# Patient Record
Sex: Male | Born: 1970
Health system: Southern US, Community
[De-identification: ages and names within clinical notes are randomized; demographics above are authoritative.]

## PROBLEM LIST (undated history)

## (undated) DIAGNOSIS — N189 Chronic kidney disease, unspecified: Secondary | ICD-10-CM

## (undated) DIAGNOSIS — C801 Malignant (primary) neoplasm, unspecified: Secondary | ICD-10-CM

## (undated) DIAGNOSIS — F909 Attention-deficit hyperactivity disorder, unspecified type: Secondary | ICD-10-CM

## (undated) DIAGNOSIS — G4730 Sleep apnea, unspecified: Secondary | ICD-10-CM

## (undated) DIAGNOSIS — N529 Male erectile dysfunction, unspecified: Secondary | ICD-10-CM

## (undated) DIAGNOSIS — T50902A Poisoning by unspecified drugs, medicaments and biological substances, intentional self-harm, initial encounter: Secondary | ICD-10-CM

## (undated) HISTORY — PX: NO PAST SURGERIES: SHX2092

## (undated) HISTORY — PX: PROSTATE BIOPSY: SHX241

---

## 1997-07-09 ENCOUNTER — Emergency Department (HOSPITAL_COMMUNITY): Admission: EM | Admit: 1997-07-09 | Discharge: 1997-07-09 | Payer: Self-pay | Admitting: Emergency Medicine

## 2009-11-08 ENCOUNTER — Emergency Department (HOSPITAL_COMMUNITY): Admission: EM | Admit: 2009-11-08 | Discharge: 2009-11-09 | Payer: Self-pay | Admitting: Emergency Medicine

## 2009-11-08 IMAGING — CR DG FOREARM 2V*L*
2 series · 2 of 2 positions shown · non-contrast
Comparison: None.

Addendum Begins

Subtle, nondisplaced fracture is identified adjacent to the distal
most screw of the screw plate fixation of the ulna.
Addendum Ends
CLINICAL DATA: Left arm injury and pain.
LEFT FOREARM - 2 VIEW

[x forearm ap left]
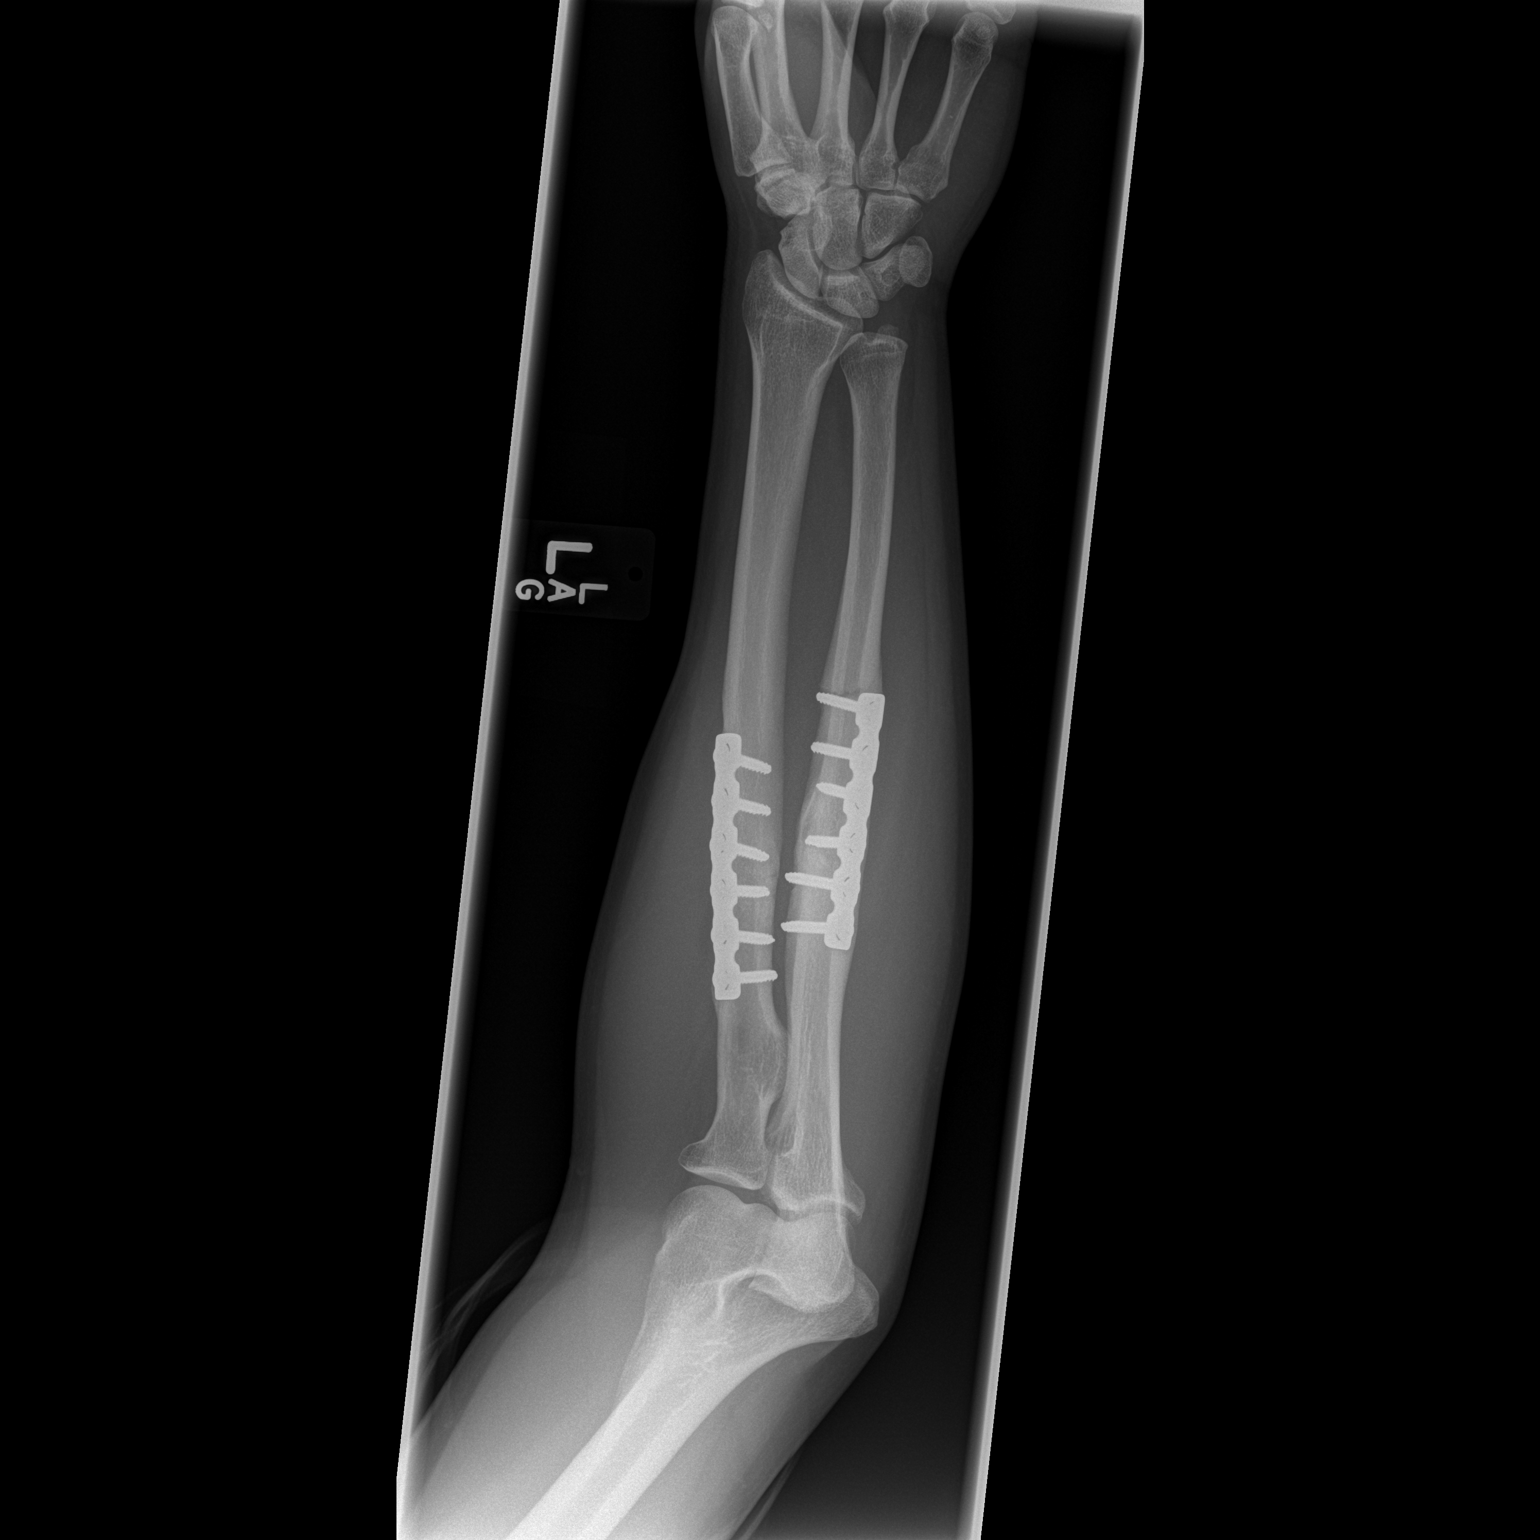

[x forearm lat left]
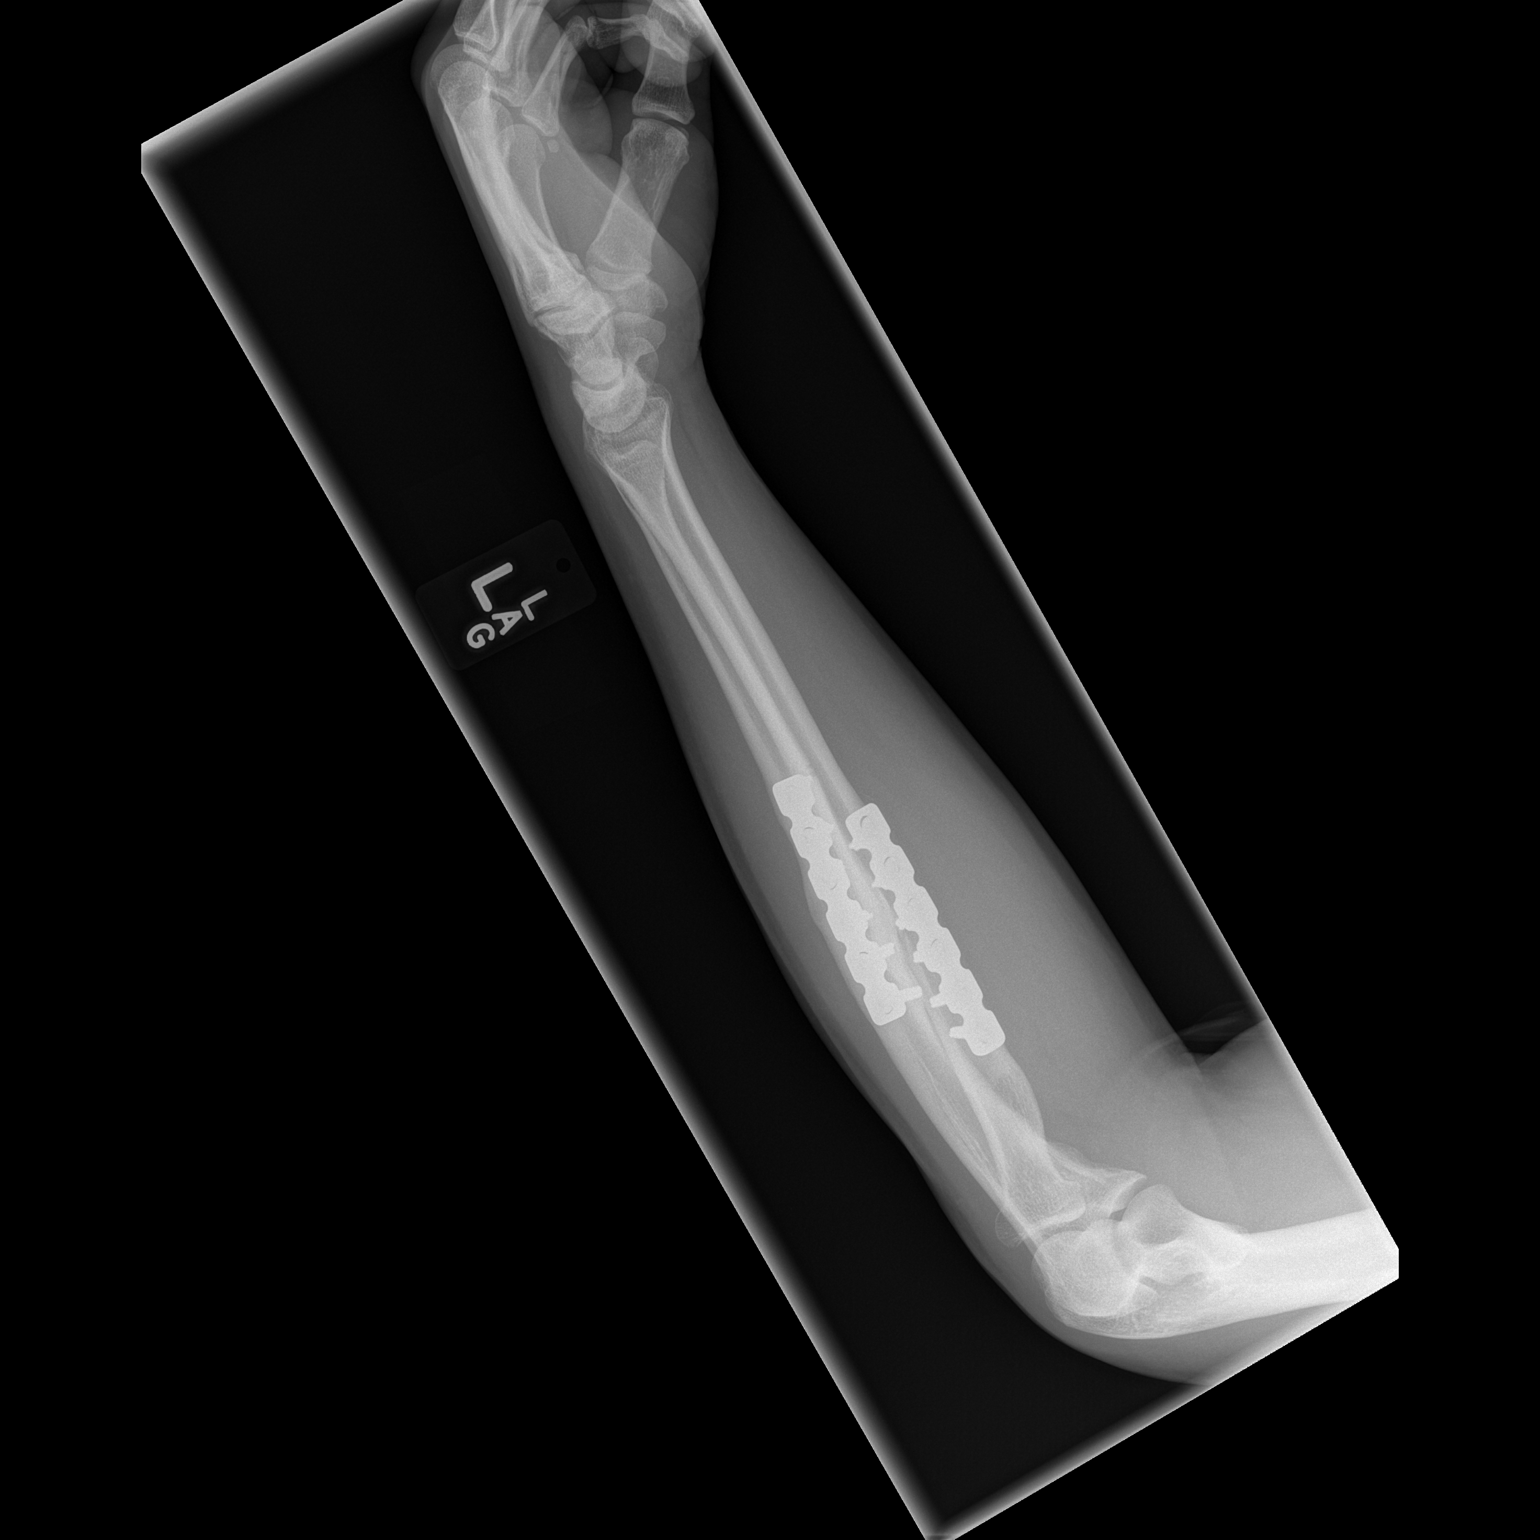

[2 of 2 positions shown; findings below may reference images not displayed]

FINDINGS: Plate and screws are seen fixing old healed fractures of
the diaphyses of the radius and ulna.  Position and alignment are
anatomic.  Hardware intact.  No acute abnormality is identified.
IMPRESSION: 1.  No acute abnormality.
2.  Old healed ulnar and radial fractures.

## 2011-07-11 ENCOUNTER — Encounter (HOSPITAL_COMMUNITY): Payer: Self-pay | Admitting: *Deleted

## 2011-07-11 ENCOUNTER — Emergency Department (HOSPITAL_COMMUNITY)
Admission: EM | Admit: 2011-07-11 | Discharge: 2011-07-12 | Disposition: A | Discharge status: Home / Self Care | Payer: Self-pay | Attending: Emergency Medicine | Admitting: Emergency Medicine

## 2011-07-11 DIAGNOSIS — T50901A Poisoning by unspecified drugs, medicaments and biological substances, accidental (unintentional), initial encounter: Secondary | ICD-10-CM | POA: Insufficient documentation

## 2011-07-11 DIAGNOSIS — T50902A Poisoning by unspecified drugs, medicaments and biological substances, intentional self-harm, initial encounter: Secondary | ICD-10-CM | POA: Insufficient documentation

## 2011-07-11 DIAGNOSIS — T1491XA Suicide attempt, initial encounter: Secondary | ICD-10-CM

## 2011-07-11 DIAGNOSIS — F3289 Other specified depressive episodes: Secondary | ICD-10-CM | POA: Insufficient documentation

## 2011-07-11 DIAGNOSIS — F17200 Nicotine dependence, unspecified, uncomplicated: Secondary | ICD-10-CM | POA: Insufficient documentation

## 2011-07-11 DIAGNOSIS — F32A Depression, unspecified: Secondary | ICD-10-CM

## 2011-07-11 DIAGNOSIS — F329 Major depressive disorder, single episode, unspecified: Secondary | ICD-10-CM | POA: Insufficient documentation

## 2011-07-11 LAB — COMPREHENSIVE METABOLIC PANEL
ALT: 12 U/L (ref 0–53)
AST: 14 U/L (ref 0–37)
Albumin: 3.5 g/dL (ref 3.5–5.2)
Alkaline Phosphatase: 62 U/L (ref 39–117)
BUN: 8 mg/dL (ref 6–23)
CO2: 25 mEq/L (ref 19–32)
Calcium: 9 mg/dL (ref 8.4–10.5)
Chloride: 107 mEq/L (ref 96–112)
Creatinine, Ser: 1 mg/dL (ref 0.50–1.35)
GFR calc Af Amer: >90 mL/min (ref >90)
GFR calc non Af Amer: >90 mL/min (ref >90)
Glucose, Bld: 94 mg/dL (ref 70–99)
Potassium: 3.2 mEq/L — ABNORMAL LOW (ref 3.5–5.1)
Sodium: 142 mEq/L (ref 135–145)
Total Bilirubin: 0.3 mg/dL (ref 0.3–1.2)
Total Protein: 6.5 g/dL (ref 6.0–8.3)

## 2011-07-11 LAB — DIFFERENTIAL
Basophils Absolute: 0 10*3/uL (ref 0.0–0.1)
Basophils Relative: 1 % (ref 0–1)
Eosinophils Absolute: 0 10*3/uL (ref 0.0–0.7)
Eosinophils Relative: 0 % (ref 0–5)
Lymphocytes Relative: 24 % (ref 12–46)
Lymphs Abs: 1.4 10*3/uL (ref 0.7–4.0)
Monocytes Absolute: 0.6 10*3/uL (ref 0.1–1.0)
Monocytes Relative: 9 % (ref 3–12)
Neutro Abs: 3.8 10*3/uL (ref 1.7–7.7)
Neutrophils Relative %: 65 % (ref 43–77)

## 2011-07-11 LAB — URINALYSIS, ROUTINE W REFLEX MICROSCOPIC
Bilirubin Urine: NEGATIVE
Glucose, UA: NEGATIVE mg/dL
Hgb urine dipstick: NEGATIVE
Ketones, ur: NEGATIVE mg/dL
Leukocytes, UA: NEGATIVE
Nitrite: NEGATIVE
Protein, ur: NEGATIVE mg/dL
Specific Gravity, Urine: 1.02 (ref 1.005–1.030)
Urobilinogen, UA: 0.2 mg/dL (ref 0.0–1.0)
pH: 6 (ref 5.0–8.0)

## 2011-07-11 LAB — SALICYLATE LEVEL: Salicylate Lvl: <2 mg/dL — ABNORMAL LOW (ref 2.8–20.0)

## 2011-07-11 LAB — LIPASE, BLOOD: Lipase: 31 U/L (ref 11–59)

## 2011-07-11 LAB — CBC
HCT: 42.5 % (ref 39.0–52.0)
Hemoglobin: 14.6 g/dL (ref 13.0–17.0)
MCH: 31.1 pg (ref 26.0–34.0)
MCHC: 34.4 g/dL (ref 30.0–36.0)
MCV: 90.6 fL (ref 78.0–100.0)
Platelets: 272 10*3/uL (ref 150–400)
RBC: 4.69 MIL/uL (ref 4.22–5.81)
RDW: 12.9 % (ref 11.5–15.5)
WBC: 5.9 10*3/uL (ref 4.0–10.5)

## 2011-07-11 LAB — ACETAMINOPHEN LEVEL: Acetaminophen (Tylenol), Serum: <15 ug/mL (ref 10–30)

## 2011-07-11 MED ORDER — IBUPROFEN 600 MG PO TABS: 600.0000 mg | ORAL_TABLET | Freq: Three times a day (TID) | ORAL | Status: DC | PRN

## 2011-07-11 MED ORDER — SODIUM CHLORIDE 0.9 % IV SOLN
Freq: Once | INTRAVENOUS | Status: AC
  Administered 2011-07-11: 11:00:00 via INTRAVENOUS

## 2011-07-11 MED ORDER — ONDANSETRON HCL 4 MG/2ML IJ SOLN
4.0000 mg | Freq: Once | INTRAMUSCULAR | Status: AC
  Administered 2011-07-11: 4 mg via INTRAVENOUS
  Filled 2011-07-11: qty 2

## 2011-07-11 MED ORDER — NICOTINE 21 MG/24HR TD PT24
21.0000 mg | MEDICATED_PATCH | Freq: Every day | TRANSDERMAL | Status: DC | PRN
  Administered 2011-07-11 – 2011-07-12 (×2): 21 mg via TRANSDERMAL
  Filled 2011-07-11 (×2): qty 1

## 2011-07-11 MED ORDER — ACTIDOSE WITH SORBITOL 50 GM/240ML PO LIQD
75.0000 g | Freq: Once | ORAL | Status: AC
  Administered 2011-07-11: 75 g via ORAL
  Filled 2011-07-11: qty 480

## 2011-07-11 MED ORDER — ACETAMINOPHEN 325 MG PO TABS: 650.0000 mg | ORAL_TABLET | ORAL | Status: DC | PRN

## 2011-07-11 MED ORDER — ONDANSETRON HCL 4 MG PO TABS: 4.0000 mg | ORAL_TABLET | Freq: Three times a day (TID) | ORAL | Status: DC | PRN

## 2011-07-11 MED ORDER — ZOLPIDEM TARTRATE 5 MG PO TABS: 5.0000 mg | ORAL_TABLET | Freq: Every evening | ORAL | Status: DC | PRN

## 2011-07-11 NOTE — ED Notes (Signed)
Per ems: pt took unknown number of metaformin and linsinapril, levothyroidin. Pt states he took them because he wanted to sleep. Pt is going through a seperation. Pt denies si but wanted to sleep. His ex wife found out he took all the pills and called the gpd. gpd called ems. Pt is stable

## 2011-07-11 NOTE — ED Notes (Signed)
Sitter at bedside. Poison control called edmd notified or recommendations

## 2011-07-11 NOTE — ED Provider Notes (Signed)
History     CSN: [id number]  Arrival date & time 07/11/11  0865   First MD Initiated Contact with Patient 07/11/11 1017      Chief Complaint  Patient presents with  . Drug Overdose    (Consider location/radiation/quality/duration/timing/severity/associated sxs/prior treatment) HPI Comments: Roberto Knapp is a 41 y.o. Male who overdosed on unknown medication at 9 AM this morning. He took multiple pills of multiple types that were his grandmother's. Prior to the ingestion, which occurred at his grandmother's house, at 6 AM, the patient called his ex-girlfriend to tell her to pick up things from their home. They separated 10 days ago. He's been upset over the separation. He is helpless and hopeless. He does not have a therapist. 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 He had no preceding illness.  Patient is a 41 y.o. male presenting with Overdose. The history is provided by the patient.  Drug Overdose    History reviewed. No pertinent past medical history.  History reviewed. No pertinent past surgical history.  No family history on file.  History  Substance Use Topics  . Smoking status: Current Everyday Smoker  . Smokeless tobacco: Not on file  . Alcohol Use: No      Review of Systems  All other systems reviewed and are negative.    Allergies  Review of patient's allergies indicates no known allergies.  Home Medications  No current outpatient prescriptions on file.  BP 102/64  Pulse 55  Temp(Src) 98.3 F (36.8 C) (Oral)  Resp 14  Wt 137 lb 1.6 oz (62.188 kg)  SpO2 100%  Physical Exam  Nursing note and vitals reviewed. Constitutional: He is oriented to person, place, and time. He appears well-developed and well-nourished.  HENT:  Head: Normocephalic and atraumatic.  Right Ear: External ear normal.  Left Ear: External ear normal.  Eyes: Conjunctivae and EOM are normal. Pupils are equal, round, and reactive to light.  Neck: Normal range of motion and phonation normal.  Neck supple.  Cardiovascular: Normal rate, regular rhythm, normal heart sounds and intact distal pulses.   Pulmonary/Chest: Effort normal and breath sounds normal. He exhibits no bony tenderness.  Abdominal: Soft. Normal appearance. There is no tenderness.  Musculoskeletal: Normal range of motion.  Neurological: He is alert and oriented to person, place, and time. He has normal strength. No cranial nerve deficit or sensory deficit. He exhibits normal muscle tone. Coordination normal.  Skin: Skin is warm, dry and intact.  Psychiatric: His behavior is normal.       He is tearful and depressed appearing    ED Course  Procedures (including critical care time)    Date: 07/11/2011  Rate: 62  Rhythm: normal sinus rhythm  QRS Axis: normal  Intervals: normal  ST/T Wave abnormalities: normal  Conduction Disutrbances:none  Narrative Interpretation:   Old EKG Reviewed: none available ED Treatment: Oral Charcoal    Labs Reviewed  COMPREHENSIVE METABOLIC PANEL - Abnormal; Notable for the following:    Potassium 3.2 (*)    All other components within normal limits  SALICYLATE LEVEL - Abnormal; Notable for the following:    Salicylate Lvl <2.0 (*)    All other components within normal limits  CBC  DIFFERENTIAL  LIPASE, BLOOD  URINALYSIS, ROUTINE W REFLEX MICROSCOPIC  ACETAMINOPHEN LEVEL   No results found.   1. Suicide attempt   2. Depression       MDM  Intentional Drug OD, meds. Taken are not known. Medically cleared in ED. He needs  psychiatric stabilization. Pt is voluntary.        Flint Melter, MD 07/11/11 2132

## 2011-07-11 NOTE — BH Assessment (Addendum)
Assessment Note   Roberto Knapp is a 41 y.o. male who was brought in by EMS after pt called his ex-girlfriend stating that he had taken an unknown amount of his grandfather's medication. Pt currently denies SI. Pt states he broke up with his girlfriend last Friday and has been having a difficult time coping. He states they were together a long time and he has been having having symptoms of depression since their break-up. He states he has had no appetite and has lost 8 pounds in the past week. He also states that he has been unable to sleep more than 2 hours a night since the break up. Pt reports he wanted to sleep so he took his grandfather's medication. He denies any suicidal intent, but repeated several times he wants to go to sleep and not have to wake up.  He reports no prior mental health concerns. Pt also expresses that Sunday is the 13th anniversary of his mother's death and mother's day. He states he has been thinking about her a lot over the past few days. He reports almost no social supports, stating he lives with his Grandfather who is "sometimes" supportive "depending on his mood."  Pt denies HI, AHVH, and SA. Pt reports he does not want to miss work this week and lose his job. He is currently seeking outpatient referrals and states he can contract for safety.     Axis I: Adjustment Disorder with Depressed Mood Axis II: Deferred Axis III: History reviewed. No pertinent past medical history. Axis IV: other psychosocial or environmental problems, problems related to legal system/crime, problems related to social environment and problems with primary support group Axis V: 31-40 impairment in reality testing  Past Medical History: History reviewed. No pertinent past medical history.  History reviewed. No pertinent past surgical history.  Family History: No family history on file.  Social History:  reports that he has been smoking.  He does not have any smokeless tobacco history on file. He  reports that he does not drink alcohol or use illicit drugs.  Additional Social History:  Alcohol / Drug Use History of alcohol / drug use?: No history of alcohol / drug abuse Allergies: No Known Allergies  Home Medications:  (Not in a hospital admission)  OB/GYN Status:  No LMP for male patient.  General Assessment Data Location of Assessment: [facility] ED Living Arrangements: Other relatives (grandfather) Can pt return to current living arrangement?: Yes Admission Status: Voluntary Is patient capable of signing voluntary admission?: Yes Transfer from: Acute Hospital Referral Source: MD  Education Status Is patient currently in school?: No Highest grade of school patient has completed: 10  Risk to self Suicidal Intent: No (pt denies but per EMS, pt called ex-girlfriend expressing SI) Is patient at risk for suicide?: Yes Suicidal Plan?: No (pt denies, per EMS pt attempted by OD) Access to Means: Yes Specify Access to Suicidal Means: grandfather's medication What has been your use of drugs/alcohol within the last 12 months?: denies Previous Attempts/Gestures: No How many times?: 0  Other Self Harm Risks: none Triggers for Past Attempts: None known Intentional Self Injurious Behavior: None Family Suicide History: No Recent stressful life event(s):  (break up with girlfriend, anversiary of mother's death) Persecutory voices/beliefs?: No Depression: Yes Depression Symptoms: Despondent;Tearfulness;Fatigue;Isolating;Loss of interest in usual pleasures Substance abuse history and/or treatment for substance abuse?: No Suicide prevention information given to non-admitted patients: Not applicable  Risk to Others Homicidal Ideation: No Thoughts of Harm to Others: No Current  Homicidal Intent: No Current Homicidal Plan: No Access to Homicidal Means: No Identified Victim: none History of harm to others?: No Assessment of Violence: None Noted Violent Behavior Description:  cooperative Does patient have access to weapons?: No Criminal Charges Pending?: Yes Describe Pending Criminal Charges: larceny Does patient have a court date: Yes Court Date: 07/20/11  Psychosis Hallucinations: None noted Delusions: None noted  Mental Status Report Appear/Hygiene: Disheveled Eye Contact: Fair Motor Activity: Unremarkable Speech: Logical/coherent Level of Consciousness: Alert Mood: Depressed Affect: Depressed Anxiety Level: None Thought Processes: Coherent;Relevant Judgement: Impaired Orientation: Person;Place;Time;Situation Obsessive Compulsive Thoughts/Behaviors: None  Cognitive Functioning Concentration: Normal Memory: Recent Intact;Remote Intact IQ: Average Insight: Fair Impulse Control: Poor Appetite: Poor Weight Loss: 8  (in 1 week) Weight Gain: 0  Sleep: Decreased Total Hours of Sleep: 2  Vegetative Symptoms: None  Prior Inpatient Therapy Prior Inpatient Therapy: No Prior Therapy Dates: n/a Prior Therapy Facilty/Provider(s): n/a Reason for Treatment: n/a  Prior Outpatient Therapy Prior Outpatient Therapy: No Prior Therapy Dates: n/a Prior Therapy Facilty/Provider(s): n/a Reason for Treatment: n/a  ADL Screening (condition at time of admission) Patient's cognitive ability adequate to safely complete daily activities?: Yes Patient able to express need for assistance with ADLs?: Yes Independently performs ADLs?: Yes Weakness of Legs: None Weakness of Arms/Hands: None  Home Assistive Devices/Equipment Home Assistive Devices/Equipment: None    Abuse/Neglect Assessment (Assessment to be complete while patient is alone) Physical Abuse: Denies Verbal Abuse: Denies Sexual Abuse: Denies Exploitation of patient/patient's resources: Denies Self-Neglect: Denies Values / Beliefs Cultural Requests During Hospitalization: None Spiritual Requests During Hospitalization: None   Advance Directives (For Healthcare) Advance Directive: Patient  does not have advance directive;Patient would not like information Pre-existing out of facility DNR order (yellow form or pink MOST form): No Nutrition Screen Diet: Regular Unintentional weight loss greater than 10lbs within the last month: No Problems chewing or swallowing foods and/or liquids: No Home Tube Feeding or Total Parenteral Nutrition (TPN): No Patient appears severely malnourished: No  Additional Information 1:1 In Past 12 Months?: No CIRT Risk: No Elopement Risk: No Does patient have medical clearance?: Yes     Disposition:  Disposition Disposition of Patient: Other dispositions (Pending telepsych)  On Site Evaluation by:   Reviewed with Physician:     Marjean Donna 07/11/2011 8:59 PM

## 2011-07-11 NOTE — Progress Notes (Signed)
Pt confirms he has never been to a pcp, psychiatrist nor psychologist

## 2011-07-11 NOTE — ED Notes (Signed)
Pt states he does not have to void at this time.  

## 2011-07-11 NOTE — ED Notes (Signed)
HQI:ON62<XB> Expected date:07/11/11<BR> Expected time: 9:44 AM<BR> Means of arrival:Ambulance<BR> Comments:<BR> overdose

## 2011-07-11 NOTE — ED Notes (Signed)
Pt states " I just want to go away." Pt states he is unsure of what he has taken he just put the in his hand and took them. Pt is alert and oriented at this time.no other c/o

## 2011-07-11 NOTE — ED Notes (Signed)
Poison controlled called... Recommendation will be given to edmd

## 2011-07-11 NOTE — ED Notes (Signed)
Sitter at bedside.

## 2011-07-12 NOTE — ED Notes (Signed)
Called Specialist on Call and requested a copy of the telepsych consult performed 07/11/2011 at 2329. Report was not retrieved last night when ACT team consultant tried and was not retrieved earlier today.

## 2011-07-12 NOTE — ED Provider Notes (Signed)
Pt was assessed by Tele psych. Pt wants to go home and the psychiatrist felt discharge was reasonable with outpatient follow up  Celene Kras, MD 07/12/11 [phone]

## 2011-07-12 NOTE — ED Notes (Addendum)
Discharge papers reviewed with pt who verbalizes understanding. Referrals given for OP treatment and mobile crisis. Bus pass given and pt departs in safe and stable condition.

## 2011-07-12 NOTE — ED Notes (Signed)
Telepsych obtained. Copy given to EDP for review. ACT recommends discharge.

## 2011-07-12 NOTE — Discharge Instructions (Signed)
Depression  You have signs of depression. This is a common problem. It can occur at any age. It is often hard to recognize. People can suffer from depression and still have moments of enjoyment. Depression interferes with your basic ability to function in life. It upsets your relationships, sleep, eating, and work habits.  CAUSES   Depression is believed to be caused by an imbalance in brain chemicals. It may be triggered by an unpleasant event. Relationship crises, a death in the family, financial worries, retirement, or other stressors are normal causes of depression. Depression may also start for no known reason. Other factors that may play a part include medical illnesses, some medicines, genetics, and alcohol or drug abuse.  SYMPTOMS    Feeling unhappy or worthless.   Long-lasting (chronic) tiredness or worn-out feeling.   Self-destructive thoughts and actions.   Not being able to sleep or sleeping too much.   Eating more than usual or not eating at all.   Headaches or feeling anxious.   Trouble concentrating or making decisions.   Unexplained physical problems and substance abuse.  TREATMENT   Depression usually gets better with treatment. This can include:   Antidepressant medicines. It can take weeks before the proper dose is achieved and benefits are reached.   Talking with a therapist, clergyperson, counselor, or friend. These people can help you gain insight into your problem and regain control of your life.   Eating a good diet.   Getting regular physical exercise, such as walking for 30 minutes every day.   Not abusing alcohol or drugs.  Treating depression often takes 6 months or longer. This length of treatment is needed to keep symptoms from returning. Call your caregiver and arrange for follow-up care as suggested.  SEEK IMMEDIATE MEDICAL CARE IF:    You start to have thoughts of hurting yourself or others.   Call your local emergency services (911 in [place]   Go to your local  medical emergency department.   Call the National Suicide Prevention Lifeline: 1-800-273-TALK (1-800-273-8255).  Document Released: 02/19/2005 Document Revised: 02/08/2011 Document Reviewed: 07/22/2009  Exit[employer] Patient Information 2012 [employer].

## 2011-07-12 NOTE — ED Provider Notes (Signed)
Pt seen and assessed this morning. Pt would like to go home. He is voluntary. Apparently pt had telepsych eval that recommended DC. I think this is reasonable based on my discussions with him. Written consult not on chart and unable to locate though. Working on obtaining another copy, specifically, to see if has further med recommendations.  Raeford Razor, MD 07/12/11 [phone]

## 2011-07-13 MED ORDER — CEFAZOLIN SODIUM 1-5 GM-% IV SOLN
INTRAVENOUS | Status: AC
  Filled 2011-07-13: qty 50

## 2011-08-19 ENCOUNTER — Emergency Department (HOSPITAL_COMMUNITY)
Admission: EM | Admit: 2011-08-19 | Discharge: 2011-08-19 | Disposition: A | Discharge status: Home / Self Care | Payer: Self-pay | Attending: Emergency Medicine | Admitting: Emergency Medicine

## 2011-08-19 ENCOUNTER — Encounter (HOSPITAL_COMMUNITY): Payer: Self-pay | Admitting: *Deleted

## 2011-08-19 DIAGNOSIS — F19939 Other psychoactive substance use, unspecified with withdrawal, unspecified: Secondary | ICD-10-CM | POA: Insufficient documentation

## 2011-08-19 DIAGNOSIS — F1420 Cocaine dependence, uncomplicated: Secondary | ICD-10-CM | POA: Insufficient documentation

## 2011-08-19 DIAGNOSIS — F19239 Other psychoactive substance dependence with withdrawal, unspecified: Secondary | ICD-10-CM | POA: Insufficient documentation

## 2011-08-19 DIAGNOSIS — Z79899 Other long term (current) drug therapy: Secondary | ICD-10-CM | POA: Insufficient documentation

## 2011-08-19 DIAGNOSIS — R4182 Altered mental status, unspecified: Secondary | ICD-10-CM | POA: Insufficient documentation

## 2011-08-19 DIAGNOSIS — F1910 Other psychoactive substance abuse, uncomplicated: Secondary | ICD-10-CM

## 2011-08-19 DIAGNOSIS — F1423 Cocaine dependence with withdrawal: Secondary | ICD-10-CM

## 2011-08-19 DIAGNOSIS — F1493 Cocaine use, unspecified with withdrawal: Secondary | ICD-10-CM

## 2011-08-19 DIAGNOSIS — F17200 Nicotine dependence, unspecified, uncomplicated: Secondary | ICD-10-CM | POA: Insufficient documentation

## 2011-08-19 HISTORY — DX: Poisoning by unspecified drugs, medicaments and biological substances, intentional self-harm, initial encounter: T50.902A

## 2011-08-19 LAB — ETHANOL: Alcohol, Ethyl (B): 58 mg/dL — ABNORMAL HIGH (ref 0–11)

## 2011-08-19 LAB — COMPREHENSIVE METABOLIC PANEL
ALT: 18 U/L (ref 0–53)
AST: 19 U/L (ref 0–37)
Albumin: 4.1 g/dL (ref 3.5–5.2)
Alkaline Phosphatase: 64 U/L (ref 39–117)
BUN: 10 mg/dL (ref 6–23)
CO2: 24 mEq/L (ref 19–32)
Calcium: 8.7 mg/dL (ref 8.4–10.5)
Chloride: 107 mEq/L (ref 96–112)
Creatinine, Ser: 0.92 mg/dL (ref 0.50–1.35)
GFR calc Af Amer: >90 mL/min (ref >90)
GFR calc non Af Amer: >90 mL/min (ref >90)
Glucose, Bld: 82 mg/dL (ref 70–99)
Potassium: 3.3 mEq/L — ABNORMAL LOW (ref 3.5–5.1)
Sodium: 144 mEq/L (ref 135–145)
Total Bilirubin: 0.2 mg/dL — ABNORMAL LOW (ref 0.3–1.2)
Total Protein: 7.3 g/dL (ref 6.0–8.3)

## 2011-08-19 LAB — RAPID URINE DRUG SCREEN, HOSP PERFORMED
Amphetamines: NOT DETECTED
Barbiturates: NOT DETECTED
Benzodiazepines: NOT DETECTED
Cocaine: POSITIVE — AB
Opiates: POSITIVE — AB
Tetrahydrocannabinol: NOT DETECTED

## 2011-08-19 LAB — CBC
HCT: 43.5 % (ref 39.0–52.0)
Hemoglobin: 15.3 g/dL (ref 13.0–17.0)
MCH: 32.3 pg (ref 26.0–34.0)
MCHC: 35.2 g/dL (ref 30.0–36.0)
MCV: 92 fL (ref 78.0–100.0)
Platelets: 277 10*3/uL (ref 150–400)
RBC: 4.73 MIL/uL (ref 4.22–5.81)
RDW: 13.2 % (ref 11.5–15.5)
WBC: 10.4 10*3/uL (ref 4.0–10.5)

## 2011-08-19 LAB — PROTIME-INR
INR: 0.99 (ref 0.00–1.49)
Prothrombin Time: 13.3 seconds (ref 11.6–15.2)

## 2011-08-19 LAB — SALICYLATE LEVEL: Salicylate Lvl: <2 mg/dL — ABNORMAL LOW (ref 2.8–20.0)

## 2011-08-19 LAB — ACETAMINOPHEN LEVEL: Acetaminophen (Tylenol), Serum: <15 ug/mL (ref 10–30)

## 2011-08-19 NOTE — Discharge Instructions (Signed)
Finding Treatment for Alcohol and Drug Addiction   It can be hard to find the right place to get professional treatment. Here are some important things to consider:   There are different types of treatment to choose from.   Some programs are live-in (residential) while others are not (outpatient). Sometimes a combination is offered.   No single type of program is right for everyone.   Most treatment programs involve a combination of education, counseling, and a 12-step, spiritually-based approach.   There are non-spiritually based programs (not 12-step).   Some treatment programs are government sponsored. They are geared for patients without private insurance.   Treatment programs can vary in many respects such as:   Cost and types of insurance accepted.   Types of on-site medical services offered.   Length of stay, setting, and size.   Overall philosophy of treatment.   A person may need specialized treatment or have needs not addressed by all programs. For example, adolescents need treatment appropriate for their age. Other people have secondary disorders that must be managed as well. Secondary conditions can include mental illness, such as depression or diabetes. Often, a period of detoxification from alcohol or drugs is needed. This requires medical supervision and not all programs offer this.   THINGS TO CONSIDER WHEN SELECTING A TREATMENT PROGRAM   Is the program certified by the appropriate government agency? Even private programs must be certified and employ certified professionals.   Does the program accept your insurance? If not, can a payment plan be set up?   Is the facility clean, organized, and well run? Do they allow you to speak with graduates who can share their treatment experience with you? Can you tour the facility? Can you meet with staff?   Does the program meet the full range of individual needs?   Does the treatment program address sexual orientation and physical disabilities? Do they provide  age, gender, and culturally appropriate treatment services?   Is treatment available in languages other than English?   Is long-term aftercare support or guidance encouraged and provided?   Is assessment of an individual's treatment plan ongoing to ensure it meets changing needs?   Does the program use strategies to encourage reluctant patients to remain in treatment long enough to increase the likelihood of success?   Does the program offer counseling (individual or group) and other behavioral therapies?   Does the program offer medicine as part of the treatment regimen, if needed?   Is there ongoing monitoring of possible relapse? Is there a defined relapse prevention program? Are services or referrals offered to family members to ensure they understand addiction and the recovery process? This would help them support the recovering individual.   Are 12-step meetings held at the center or is transport available for patients to attend outside meetings?  In countries outside of the [place] and [place], see local directories for contact information for services in your area.   Document Released: 01/18/2005 Document Revised: 02/08/2011 Document Reviewed: 07/31/2007   Exit[employer]® Patient Information ©2012 [employer].

## 2011-08-19 NOTE — ED Notes (Addendum)
Pt reports was drinking "bud light last night & smoke a couple of joints with his neighbor". Denies other drugs. Oriented to person & place only. Cannot recall how he got to ED or where he was. Pt with no voiced complaints. Moves all 4 extremities, strength=.
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
## 2011-09-08 ENCOUNTER — Encounter (HOSPITAL_COMMUNITY)
Admission: EM | Disposition: A | Discharge status: Home / Self Care | Payer: Self-pay | Source: Home / Self Care | Attending: Vascular Surgery

## 2011-09-08 ENCOUNTER — Other Ambulatory Visit: Payer: Self-pay

## 2011-09-08 ENCOUNTER — Encounter (HOSPITAL_COMMUNITY): Payer: Self-pay | Admitting: Certified Registered"

## 2011-09-08 ENCOUNTER — Emergency Department (HOSPITAL_COMMUNITY): Payer: Self-pay | Admitting: Certified Registered"

## 2011-09-08 ENCOUNTER — Emergency Department (HOSPITAL_COMMUNITY): Payer: Self-pay

## 2011-09-08 ENCOUNTER — Encounter (HOSPITAL_COMMUNITY): Payer: Self-pay | Admitting: Emergency Medicine

## 2011-09-08 ENCOUNTER — Inpatient Hospital Stay (HOSPITAL_COMMUNITY)
Admission: EM | Admit: 2011-09-08 | Discharge: 2011-09-16 | DRG: 907 | Disposition: A | Discharge status: Home / Self Care | Payer: MEDICAID | Attending: Vascular Surgery | Admitting: Vascular Surgery

## 2011-09-08 DIAGNOSIS — Y92009 Unspecified place in unspecified non-institutional (private) residence as the place of occurrence of the external cause: Secondary | ICD-10-CM

## 2011-09-08 DIAGNOSIS — S8490XA Injury of unspecified nerve at lower leg level, unspecified leg, initial encounter: Secondary | ICD-10-CM | POA: Diagnosis present

## 2011-09-08 DIAGNOSIS — S85009A Unspecified injury of popliteal artery, unspecified leg, initial encounter: Principal | ICD-10-CM | POA: Diagnosis present

## 2011-09-08 DIAGNOSIS — Y998 Other external cause status: Secondary | ICD-10-CM

## 2011-09-08 DIAGNOSIS — T79A29A Traumatic compartment syndrome of unspecified lower extremity, initial encounter: Secondary | ICD-10-CM | POA: Diagnosis present

## 2011-09-08 DIAGNOSIS — S85509A Unspecified injury of popliteal vein, unspecified leg, initial encounter: Secondary | ICD-10-CM | POA: Diagnosis present

## 2011-09-08 DIAGNOSIS — S71132A Puncture wound without foreign body, left thigh, initial encounter: Secondary | ICD-10-CM

## 2011-09-08 DIAGNOSIS — S7290XA Unspecified fracture of unspecified femur, initial encounter for closed fracture: Secondary | ICD-10-CM

## 2011-09-08 DIAGNOSIS — R000 Tachycardia, unspecified: Secondary | ICD-10-CM | POA: Diagnosis present

## 2011-09-08 DIAGNOSIS — S71009A Unspecified open wound, unspecified hip, initial encounter: Secondary | ICD-10-CM | POA: Diagnosis present

## 2011-09-08 DIAGNOSIS — M62838 Other muscle spasm: Secondary | ICD-10-CM | POA: Diagnosis not present

## 2011-09-08 DIAGNOSIS — D62 Acute posthemorrhagic anemia: Secondary | ICD-10-CM | POA: Diagnosis not present

## 2011-09-08 DIAGNOSIS — S72309B Unspecified fracture of shaft of unspecified femur, initial encounter for open fracture type I or II: Secondary | ICD-10-CM | POA: Diagnosis present

## 2011-09-08 DIAGNOSIS — Z1810 Retained metal fragments, unspecified: Secondary | ICD-10-CM

## 2011-09-08 DIAGNOSIS — W3400XA Accidental discharge from unspecified firearms or gun, initial encounter: Secondary | ICD-10-CM

## 2011-09-08 DIAGNOSIS — S71109A Unspecified open wound, unspecified thigh, initial encounter: Secondary | ICD-10-CM | POA: Diagnosis present

## 2011-09-08 HISTORY — PX: FEMORAL-POPLITEAL BYPASS GRAFT: SHX937

## 2011-09-08 HISTORY — PX: FASCIOTOMY: SHX132

## 2011-09-08 LAB — LACTIC ACID, PLASMA: Lactic Acid, Venous: 5.6 mmol/L — ABNORMAL HIGH (ref 0.5–2.2)

## 2011-09-08 LAB — COMPREHENSIVE METABOLIC PANEL
ALT: 15 U/L (ref 0–53)
AST: 18 U/L (ref 0–37)
Albumin: 3.5 g/dL (ref 3.5–5.2)
Alkaline Phosphatase: 63 U/L (ref 39–117)
BUN: 8 mg/dL (ref 6–23)
CO2: 19 mEq/L (ref 19–32)
Calcium: 8.6 mg/dL (ref 8.4–10.5)
Chloride: 99 mEq/L (ref 96–112)
Creatinine, Ser: 1.15 mg/dL (ref 0.50–1.35)
GFR calc Af Amer: 89 mL/min — ABNORMAL LOW (ref >90)
GFR calc non Af Amer: 77 mL/min — ABNORMAL LOW (ref >90)
Glucose, Bld: 140 mg/dL — ABNORMAL HIGH (ref 70–99)
Potassium: 3.7 mEq/L (ref 3.5–5.1)
Sodium: 136 mEq/L (ref 135–145)
Total Bilirubin: 0.2 mg/dL — ABNORMAL LOW (ref 0.3–1.2)
Total Protein: 6.5 g/dL (ref 6.0–8.3)

## 2011-09-08 LAB — URINALYSIS, MICROSCOPIC ONLY
Bilirubin Urine: NEGATIVE
Glucose, UA: NEGATIVE mg/dL
Hgb urine dipstick: NEGATIVE
Ketones, ur: NEGATIVE mg/dL
Leukocytes, UA: NEGATIVE
Nitrite: NEGATIVE
Protein, ur: NEGATIVE mg/dL
Specific Gravity, Urine: 1.008 (ref 1.005–1.030)
Urobilinogen, UA: 0.2 mg/dL (ref 0.0–1.0)
pH: 7 (ref 5.0–8.0)

## 2011-09-08 LAB — ABO/RH: ABO/RH(D): A POS

## 2011-09-08 LAB — POCT I-STAT, CHEM 8
BUN: 6 mg/dL (ref 6–23)
Calcium, Ion: 1.08 mmol/L — ABNORMAL LOW (ref 1.12–1.23)
Chloride: 102 mEq/L (ref 96–112)
Creatinine, Ser: 1.3 mg/dL (ref 0.50–1.35)
Glucose, Bld: 138 mg/dL — ABNORMAL HIGH (ref 70–99)
HCT: 41 % (ref 39.0–52.0)
Hemoglobin: 13.9 g/dL (ref 13.0–17.0)
Potassium: 3.7 mEq/L (ref 3.5–5.1)
Sodium: 138 mEq/L (ref 135–145)
TCO2: 19 mmol/L (ref 0–100)

## 2011-09-08 LAB — CBC
HCT: 39.7 % (ref 39.0–52.0)
Hemoglobin: 13.5 g/dL (ref 13.0–17.0)
MCH: 31.3 pg (ref 26.0–34.0)
MCHC: 34 g/dL (ref 30.0–36.0)
MCV: 92.1 fL (ref 78.0–100.0)
Platelets: 283 10*3/uL (ref 150–400)
RBC: 4.31 MIL/uL (ref 4.22–5.81)
RDW: 13.2 % (ref 11.5–15.5)
WBC: 20.7 10*3/uL — ABNORMAL HIGH (ref 4.0–10.5)

## 2011-09-08 LAB — PROTIME-INR
INR: 0.97 (ref 0.00–1.49)
Prothrombin Time: 13.1 seconds (ref 11.6–15.2)

## 2011-09-08 LAB — HEPARIN LEVEL (UNFRACTIONATED): Heparin Unfractionated: <0.1 IU/mL — ABNORMAL LOW (ref 0.30–0.70)

## 2011-09-08 LAB — ETHANOL: Alcohol, Ethyl (B): 109 mg/dL — ABNORMAL HIGH (ref 0–11)

## 2011-09-08 LAB — MRSA PCR SCREENING: MRSA by PCR: NEGATIVE

## 2011-09-08 SURGERY — BYPASS GRAFT FEMORAL-POPLITEAL ARTERY
Anesthesia: General | Site: Thigh | Laterality: Left | Wound class: Contaminated

## 2011-09-08 MED ORDER — LIDOCAINE HCL (CARDIAC) 20 MG/ML IV SOLN
INTRAVENOUS | Status: DC | PRN
  Administered 2011-09-08: 100 mg via INTRAVENOUS

## 2011-09-08 MED ORDER — 0.9 % SODIUM CHLORIDE (POUR BTL) OPTIME
TOPICAL | Status: DC | PRN
  Administered 2011-09-08: 100 mL

## 2011-09-08 MED ORDER — GUAIFENESIN-DM 100-10 MG/5ML PO SYRP: 15.0000 mL | ORAL_SOLUTION | ORAL | Status: DC | PRN

## 2011-09-08 MED ORDER — DEXTROSE-NACL 5-0.45 % IV SOLN
INTRAVENOUS | Status: DC
  Administered 2011-09-08: 125 mL via INTRAVENOUS
  Administered 2011-09-08: 11:00:00 via INTRAVENOUS
  Administered 2011-09-09: 125 mL/h via INTRAVENOUS
  Administered 2011-09-10 – 2011-09-15 (×10): via INTRAVENOUS

## 2011-09-08 MED ORDER — ONDANSETRON HCL 4 MG/2ML IJ SOLN
INTRAMUSCULAR | Status: DC | PRN
  Administered 2011-09-08: 4 mg via INTRAVENOUS

## 2011-09-08 MED ORDER — LACTATED RINGERS IV SOLN
INTRAVENOUS | Status: DC | PRN
  Administered 2011-09-08: 02:00:00 via INTRAVENOUS

## 2011-09-08 MED ORDER — MIDAZOLAM HCL 5 MG/5ML IJ SOLN
INTRAMUSCULAR | Status: DC | PRN
  Administered 2011-09-08: 2 mg via INTRAVENOUS

## 2011-09-08 MED ORDER — ROCURONIUM BROMIDE 100 MG/10ML IV SOLN
INTRAVENOUS | Status: DC | PRN
  Administered 2011-09-08: 30 mg via INTRAVENOUS

## 2011-09-08 MED ORDER — HETASTARCH-ELECTROLYTES 6 % IV SOLN
INTRAVENOUS | Status: DC | PRN
  Administered 2011-09-08: 04:00:00 via INTRAVENOUS

## 2011-09-08 MED ORDER — LIDOCAINE HCL 4 % MT SOLN
OROMUCOSAL | Status: DC | PRN
  Administered 2011-09-08: 4 mL via TOPICAL

## 2011-09-08 MED ORDER — ACETAMINOPHEN 325 MG PO TABS: 325.0000 mg | ORAL_TABLET | ORAL | Status: DC | PRN

## 2011-09-08 MED ORDER — DEXTROSE 5 % IV SOLN
1.5000 g | Freq: Two times a day (BID) | INTRAVENOUS | Status: AC
  Administered 2011-09-08 – 2011-09-09 (×2): 1.5 g via INTRAVENOUS
  Filled 2011-09-08 (×2): qty 1.5

## 2011-09-08 MED ORDER — MIDAZOLAM HCL 2 MG/2ML IJ SOLN
2.0000 mg | Freq: Once | INTRAMUSCULAR | Status: AC
  Administered 2011-09-08: 2 mg via INTRAVENOUS

## 2011-09-08 MED ORDER — PHENYLEPHRINE HCL 10 MG/ML IJ SOLN
INTRAMUSCULAR | Status: DC | PRN
  Administered 2011-09-08 (×2): 80 ug via INTRAVENOUS

## 2011-09-08 MED ORDER — SUFENTANIL CITRATE 50 MCG/ML IV SOLN
INTRAVENOUS | Status: DC | PRN
  Administered 2011-09-08 (×2): 10 ug via INTRAVENOUS

## 2011-09-08 MED ORDER — DROPERIDOL 2.5 MG/ML IJ SOLN
INTRAMUSCULAR | Status: DC | PRN
  Administered 2011-09-08: 0.625 mg via INTRAVENOUS

## 2011-09-08 MED ORDER — OXYCODONE HCL 5 MG PO TABS
5.0000 mg | ORAL_TABLET | ORAL | Status: DC | PRN
  Administered 2011-09-08 – 2011-09-14 (×16): 10 mg via ORAL
  Administered 2011-09-14: 5 mg via ORAL
  Administered 2011-09-14 (×2): 10 mg via ORAL
  Administered 2011-09-14: 5 mg via ORAL
  Administered 2011-09-14 – 2011-09-15 (×4): 10 mg via ORAL
  Filled 2011-09-08 (×14): qty 2
  Filled 2011-09-08: qty 1
  Filled 2011-09-08 (×10): qty 2

## 2011-09-08 MED ORDER — HEPARIN (PORCINE) IN NACL 100-0.45 UNIT/ML-% IJ SOLN
1000.0000 [IU]/h | INTRAMUSCULAR | Status: DC
  Filled 2011-09-08 (×2): qty 250

## 2011-09-08 MED ORDER — HEPARIN (PORCINE) IN NACL 100-0.45 UNIT/ML-% IJ SOLN
600.0000 [IU]/h | INTRAMUSCULAR | Status: DC
  Administered 2011-09-08: 600 [IU]/h via INTRAVENOUS
  Filled 2011-09-08: qty 250

## 2011-09-08 MED ORDER — CEFAZOLIN SODIUM 1-5 GM-% IV SOLN
INTRAVENOUS | Status: DC | PRN
  Administered 2011-09-08: 2 g via INTRAVENOUS

## 2011-09-08 MED ORDER — POTASSIUM CHLORIDE CRYS ER 20 MEQ PO TBCR
20.0000 meq | EXTENDED_RELEASE_TABLET | Freq: Once | ORAL | Status: AC | PRN
Start: 2011-09-08 — End: 2011-09-08

## 2011-09-08 MED ORDER — PHENOL 1.4 % MT LIQD: 1.0000 | OROMUCOSAL | Status: DC | PRN

## 2011-09-08 MED ORDER — FENTANYL CITRATE 0.05 MG/ML IJ SOLN
INTRAMUSCULAR | Status: AC
  Filled 2011-09-08: qty 2

## 2011-09-08 MED ORDER — NICOTINE 21 MG/24HR TD PT24
21.0000 mg | MEDICATED_PATCH | Freq: Every day | TRANSDERMAL | Status: DC
  Administered 2011-09-08 – 2011-09-16 (×8): 21 mg via TRANSDERMAL
  Filled 2011-09-08 (×9): qty 1

## 2011-09-08 MED ORDER — DOCUSATE SODIUM 100 MG PO CAPS
100.0000 mg | ORAL_CAPSULE | Freq: Every day | ORAL | Status: DC
  Administered 2011-09-09 – 2011-09-16 (×8): 100 mg via ORAL
  Filled 2011-09-08 (×8): qty 1

## 2011-09-08 MED ORDER — PROPOFOL 10 MG/ML IV EMUL
INTRAVENOUS | Status: DC | PRN
  Administered 2011-09-08: 200 mg via INTRAVENOUS

## 2011-09-08 MED ORDER — MAGNESIUM SULFATE 40 MG/ML IJ SOLN
2.0000 g | Freq: Once | INTRAMUSCULAR | Status: AC | PRN
  Filled 2011-09-08: qty 50

## 2011-09-08 MED ORDER — METOPROLOL TARTRATE 1 MG/ML IV SOLN: 2.0000 mg | INTRAVENOUS | Status: DC | PRN

## 2011-09-08 MED ORDER — LABETALOL HCL 5 MG/ML IV SOLN
10.0000 mg | INTRAVENOUS | Status: DC | PRN
  Filled 2011-09-08: qty 4

## 2011-09-08 MED ORDER — PANTOPRAZOLE SODIUM 40 MG PO TBEC
40.0000 mg | DELAYED_RELEASE_TABLET | Freq: Every day | ORAL | Status: DC
  Administered 2011-09-08 – 2011-09-16 (×8): 40 mg via ORAL
  Filled 2011-09-08 (×7): qty 1

## 2011-09-08 MED ORDER — ONDANSETRON HCL 4 MG/2ML IJ SOLN: 4.0000 mg | Freq: Once | INTRAMUSCULAR | Status: DC | PRN

## 2011-09-08 MED ORDER — ONDANSETRON HCL 4 MG/2ML IJ SOLN: 4.0000 mg | Freq: Four times a day (QID) | INTRAMUSCULAR | Status: DC | PRN

## 2011-09-08 MED ORDER — DEXAMETHASONE SODIUM PHOSPHATE 4 MG/ML IJ SOLN
INTRAMUSCULAR | Status: DC | PRN
  Administered 2011-09-08: 4 mg via INTRAVENOUS

## 2011-09-08 MED ORDER — HYDRALAZINE HCL 20 MG/ML IJ SOLN
10.0000 mg | INTRAMUSCULAR | Status: DC | PRN
  Filled 2011-09-08: qty 0.5

## 2011-09-08 MED ORDER — SODIUM CHLORIDE 0.9 % IR SOLN
Status: DC | PRN
  Administered 2011-09-08: 03:00:00

## 2011-09-08 MED ORDER — FENTANYL CITRATE 0.05 MG/ML IJ SOLN: 100.0000 ug | Freq: Once | INTRAMUSCULAR | Status: DC

## 2011-09-08 MED ORDER — MORPHINE SULFATE 2 MG/ML IJ SOLN
2.0000 mg | INTRAMUSCULAR | Status: DC | PRN
  Administered 2011-09-08: 4 mg via INTRAVENOUS
  Administered 2011-09-08 (×2): 2 mg via INTRAVENOUS
  Administered 2011-09-08: 5 mg via INTRAVENOUS
  Administered 2011-09-08 – 2011-09-09 (×8): 4 mg via INTRAVENOUS
  Filled 2011-09-08: qty 1
  Filled 2011-09-08 (×2): qty 2
  Filled 2011-09-08: qty 3
  Filled 2011-09-08 (×2): qty 2
  Filled 2011-09-08: qty 1
  Filled 2011-09-08 (×5): qty 2

## 2011-09-08 MED ORDER — SUCCINYLCHOLINE CHLORIDE 20 MG/ML IJ SOLN
INTRAMUSCULAR | Status: DC | PRN
  Administered 2011-09-08: 120 mg via INTRAVENOUS

## 2011-09-08 MED ORDER — HEPARIN SODIUM (PORCINE) 1000 UNIT/ML IJ SOLN
INTRAMUSCULAR | Status: DC | PRN
  Administered 2011-09-08: 7000 [IU] via INTRAVENOUS

## 2011-09-08 MED ORDER — ALUM & MAG HYDROXIDE-SIMETH 200-200-20 MG/5ML PO SUSP: 15.0000 mL | ORAL | Status: DC | PRN

## 2011-09-08 MED ORDER — HYDROMORPHONE HCL PF 1 MG/ML IJ SOLN
0.2500 mg | INTRAMUSCULAR | Status: DC | PRN
  Administered 2011-09-08 (×4): 0.5 mg via INTRAVENOUS

## 2011-09-08 MED ORDER — ACETAMINOPHEN 650 MG RE SUPP: 325.0000 mg | RECTAL | Status: DC | PRN

## 2011-09-08 MED ORDER — MAGNESIUM SULFATE 40 MG/ML IJ SOLN: 2.0000 g | Freq: Once | INTRAMUSCULAR | Status: DC | PRN

## 2011-09-08 MED ORDER — HEPARIN (PORCINE) IN NACL 100-0.45 UNIT/ML-% IJ SOLN
600.0000 [IU]/h | INTRAMUSCULAR | Status: DC
  Filled 2011-09-08: qty 250

## 2011-09-08 MED ORDER — SODIUM CHLORIDE 0.9 % IV SOLN
INTRAVENOUS | Status: DC | PRN
  Administered 2011-09-08: 03:00:00 via INTRAVENOUS

## 2011-09-08 SURGICAL SUPPLY — 69 items
ADH SKN CLS APL DERMABOND .7 (GAUZE/BANDAGES/DRESSINGS) ×1
BANDAGE ELASTIC 4 VELCRO ST LF (GAUZE/BANDAGES/DRESSINGS) IMPLANT
BANDAGE ELASTIC 6 VELCRO ST LF (GAUZE/BANDAGES/DRESSINGS) ×1 IMPLANT
BANDAGE ESMARK 6X9 LF (GAUZE/BANDAGES/DRESSINGS) IMPLANT
BANDAGE GAUZE ELAST BULKY 4 IN (GAUZE/BANDAGES/DRESSINGS) ×1 IMPLANT
BNDG CMPR 9X6 STRL LF SNTH (GAUZE/BANDAGES/DRESSINGS)
BNDG ESMARK 6X9 LF (GAUZE/BANDAGES/DRESSINGS)
CANISTER SUCTION 2500CC (MISCELLANEOUS) ×2 IMPLANT
CATH EMB 4FR 80CM (CATHETERS) ×1 IMPLANT
CLIP TI MEDIUM 24 (CLIP) ×2 IMPLANT
CLIP TI WIDE RED SMALL 24 (CLIP) ×2 IMPLANT
CLOTH BEACON ORANGE TIMEOUT ST (SAFETY) ×2 IMPLANT
CONT SPEC 4OZ CLIKSEAL STRL BL (MISCELLANEOUS) ×1 IMPLANT
CORONARY SUCKER SOFT TIP 10052 (MISCELLANEOUS) IMPLANT
COVER SURGICAL LIGHT HANDLE (MISCELLANEOUS) ×4 IMPLANT
CUFF TOURNIQUET SINGLE 24IN (TOURNIQUET CUFF) IMPLANT
CUFF TOURNIQUET SINGLE 34IN LL (TOURNIQUET CUFF) IMPLANT
CUFF TOURNIQUET SINGLE 44IN (TOURNIQUET CUFF) IMPLANT
DECANTER SPIKE VIAL GLASS SM (MISCELLANEOUS) IMPLANT
DERMABOND ADVANCED (GAUZE/BANDAGES/DRESSINGS) ×1
DERMABOND ADVANCED .7 DNX12 (GAUZE/BANDAGES/DRESSINGS) IMPLANT
DRAIN SNY WOU (WOUND CARE) IMPLANT
DRAPE WARM FLUID 44X44 (DRAPE) ×2 IMPLANT
DRAPE X-RAY CASS 24X20 (DRAPES) IMPLANT
DRSG COVADERM 4X10 (GAUZE/BANDAGES/DRESSINGS) IMPLANT
DRSG COVADERM 4X8 (GAUZE/BANDAGES/DRESSINGS) IMPLANT
ELECT REM PT RETURN 9FT ADLT (ELECTROSURGICAL) ×2
ELECTRODE REM PT RTRN 9FT ADLT (ELECTROSURGICAL) ×1 IMPLANT
EVACUATOR SILICONE 100CC (DRAIN) IMPLANT
GLOVE BIO SURGEON STRL SZ7.5 (GLOVE) ×2 IMPLANT
GOWN PREVENTION PLUS XLARGE (GOWN DISPOSABLE) ×2 IMPLANT
GOWN STRL NON-REIN LRG LVL3 (GOWN DISPOSABLE) ×5 IMPLANT
KIT BASIN OR (CUSTOM PROCEDURE TRAY) ×2 IMPLANT
KIT ROOM TURNOVER OR (KITS) ×2 IMPLANT
LOOP VESSEL MAXI BLUE (MISCELLANEOUS) ×1 IMPLANT
MARKER GRAFT CORONARY BYPASS (MISCELLANEOUS) IMPLANT
NS IRRIG 1000ML POUR BTL (IV SOLUTION) ×4 IMPLANT
PACK GENERAL/GYN (CUSTOM PROCEDURE TRAY) IMPLANT
PACK PERIPHERAL VASCULAR (CUSTOM PROCEDURE TRAY) ×2 IMPLANT
PACK UNIVERSAL I (CUSTOM PROCEDURE TRAY) IMPLANT
PAD ARMBOARD 7.5X6 YLW CONV (MISCELLANEOUS) ×4 IMPLANT
PADDING CAST COTTON 6X4 STRL (CAST SUPPLIES) IMPLANT
SET COLLECT BLD 21X3/4 12 (NEEDLE) IMPLANT
SPONGE GAUZE 4X4 12PLY (GAUZE/BANDAGES/DRESSINGS) ×2 IMPLANT
SPONGE SURGIFOAM ABS GEL 100 (HEMOSTASIS) IMPLANT
STAPLER VISISTAT 35W (STAPLE) ×1 IMPLANT
STOPCOCK 4 WAY LG BORE MALE ST (IV SETS) IMPLANT
SUT ETHILON 3 0 PS 1 (SUTURE) IMPLANT
SUT PROLENE 5 0 C 1 24 (SUTURE) ×2 IMPLANT
SUT PROLENE 6 0 CC (SUTURE) ×5 IMPLANT
SUT PROLENE 7 0 BV 1 (SUTURE) ×3 IMPLANT
SUT PROLENE 7 0 BV1 MDA (SUTURE) IMPLANT
SUT SILK 2 0 SH (SUTURE) ×2 IMPLANT
SUT SILK 3 0 (SUTURE) ×4
SUT SILK 3-0 18XBRD TIE 12 (SUTURE) IMPLANT
SUT VIC AB 2-0 CTX 36 (SUTURE) ×4 IMPLANT
SUT VIC AB 3-0 SH 27 (SUTURE) ×4
SUT VIC AB 3-0 SH 27X BRD (SUTURE) ×2 IMPLANT
SUT VIC AB 4-0 PS2 27 (SUTURE) ×2 IMPLANT
SUT VICRYL 4-0 PS2 18IN ABS (SUTURE) ×1 IMPLANT
SYR 3ML LL SCALE MARK (SYRINGE) ×1 IMPLANT
TAPE CLOTH SURG 4X10 WHT LF (GAUZE/BANDAGES/DRESSINGS) ×1 IMPLANT
TAPE UMBILICAL COTTON 1/8X30 (MISCELLANEOUS) IMPLANT
TOWEL OR 17X24 6PK STRL BLUE (TOWEL DISPOSABLE) ×4 IMPLANT
TOWEL OR 17X26 10 PK STRL BLUE (TOWEL DISPOSABLE) ×4 IMPLANT
TRAY FOLEY CATH 14FRSI W/METER (CATHETERS) ×2 IMPLANT
TUBING EXTENTION W/L.L. (IV SETS) IMPLANT
UNDERPAD 30X30 INCONTINENT (UNDERPADS AND DIAPERS) ×2 IMPLANT
WATER STERILE IRR 1000ML POUR (IV SOLUTION) ×2 IMPLANT
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
## 2011-09-08 NOTE — Consult Note (Signed)
Patient discussed with resident. Agree with the above assessment and plan. Per nursing the patient is having significant bleeding post bypass. Will reduce empiric heparin dosing and follow for bleeding closely.   Plan: 1.Start IV heparin at 600 units/hr @ 1200 2.6hr HL 3.Follow for s/s of bleeding and continuation of heparin therapy.  Severiano Gilbert 09/08/2011 9:53 AM
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
## 2011-09-08 NOTE — Anesthesia Postprocedure Evaluation (Signed)
  Anesthesia Post-op Note  Patient: Roberto Knapp  Procedure(s) Performed: Procedure(s) (LRB): BYPASS GRAFT FEMORAL-POPLITEAL ARTERY (Left) FASCIOTOMY (Left)  Patient Location: PACU  Anesthesia Type: General  Level of Consciousness: awake, alert  and oriented  Airway and Oxygen Therapy: Patient Spontanous Breathing and Patient connected to nasal cannula oxygen  Post-op Pain: mild  Post-op Assessment: Post-op Vital signs reviewed  Post-op Vital Signs: Reviewed  Complications: No apparent anesthesia complications
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
## 2011-09-08 NOTE — Progress Notes (Signed)
Call Detective Colbly with  [employer] at [phone] if condition worsens.  Ezra Sites RN Neurosurgeon
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
## 2011-09-08 NOTE — Transfer of Care (Signed)
Immediate Anesthesia Transfer of Care Note  Patient: Roberto Knapp  Procedure(s) Performed: Procedure(s) (LRB): BYPASS GRAFT FEMORAL-POPLITEAL ARTERY (Left) FASCIOTOMY (Left)  Patient Location: PACU  Anesthesia Type: General  Level of Consciousness: awake, alert , oriented, patient cooperative and responds to stimulation  Airway & Oxygen Therapy: Patient Spontanous Breathing and Patient connected to nasal cannula oxygen  Post-op Assessment: Report given to PACU RN, Post -op Vital signs reviewed and stable and Patient moving all extremities  Post vital signs: Reviewed and stable  Complications: No apparent anesthesia complications
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
## 2011-09-08 NOTE — Anesthesia Preprocedure Evaluation (Addendum)
Anesthesia Evaluation  Patient identified by MRN, date of birth, ID band Patient awake    Reviewed: Allergy & Precautions, H&P , NPO status , Patient's Chart, lab work & pertinent test results  History of Anesthesia Complications Negative for: history of anesthetic complications  Airway Mallampati: I TM Distance: >3 FB Neck ROM: Full    Dental  (+) Teeth Intact and Dental Advisory Given   Pulmonary neg pulmonary ROS, Current Smoker,  20 pk/yr hx breath sounds clear to auscultation        Cardiovascular Exercise Tolerance: Good negative cardio ROS  Rhythm:Regular Rate:Normal     Neuro/Psych negative neurological ROS  negative psych ROS   GI/Hepatic negative GI ROS, Neg liver ROS,   Endo/Other  negative endocrine ROS  Renal/GU negative Renal ROS  negative genitourinary   Musculoskeletal negative musculoskeletal ROS (+)   Abdominal   Peds  Hematology   Anesthesia Other Findings   Reproductive/Obstetrics                          Anesthesia Physical Anesthesia Plan  ASA: II and Emergent  Anesthesia Plan: General   Post-op Pain Management:    Induction: Intravenous, Rapid sequence and Cricoid pressure planned  Airway Management Planned: Oral ETT  Additional Equipment:   Intra-op Plan:   Post-operative Plan: Extubation in OR  Informed Consent: I have reviewed the patients History and Physical, chart, labs and discussed the procedure including the risks, benefits and alternatives for the proposed anesthesia with the patient or authorized representative who has indicated his/her understanding and acceptance.   Dental advisory given  Plan Discussed with: CRNA, Anesthesiologist and Surgeon  Anesthesia Plan Comments:        Anesthesia Quick Evaluation

## 2011-09-09 DIAGNOSIS — Z48812 Encounter for surgical aftercare following surgery on the circulatory system: Secondary | ICD-10-CM

## 2011-09-09 LAB — CBC
HCT: 30 % — ABNORMAL LOW (ref 39.0–52.0)
Hemoglobin: 8.9 g/dL — ABNORMAL LOW (ref 13.0–17.0)
MCH: 31.6 pg (ref 26.0–34.0)
MCHC: 29.7 g/dL — ABNORMAL LOW (ref 30.0–36.0)
MCV: 106.4 fL — ABNORMAL HIGH (ref 78.0–100.0)
Platelets: 178 10*3/uL (ref 150–400)
RBC: 2.82 MIL/uL — ABNORMAL LOW (ref 4.22–5.81)
RDW: 13.8 % (ref 11.5–15.5)
WBC: 10.7 10*3/uL — ABNORMAL HIGH (ref 4.0–10.5)

## 2011-09-09 LAB — BASIC METABOLIC PANEL
BUN: 7 mg/dL (ref 6–23)
CO2: 27 mEq/L (ref 19–32)
Calcium: 8.2 mg/dL — ABNORMAL LOW (ref 8.4–10.5)
Chloride: 101 mEq/L (ref 96–112)
Creatinine, Ser: 0.94 mg/dL (ref 0.50–1.35)
GFR calc Af Amer: >90 mL/min (ref >90)
GFR calc non Af Amer: >90 mL/min (ref >90)
Glucose, Bld: 87 mg/dL (ref 70–99)
Potassium: 3.3 mEq/L — ABNORMAL LOW (ref 3.5–5.1)
Sodium: 136 mEq/L (ref 135–145)

## 2011-09-09 LAB — HEPARIN LEVEL (UNFRACTIONATED)
Heparin Unfractionated: 0.13 IU/mL — ABNORMAL LOW (ref 0.30–0.70)
Heparin Unfractionated: 0.14 IU/mL — ABNORMAL LOW (ref 0.30–0.70)
Heparin Unfractionated: 0.27 IU/mL — ABNORMAL LOW (ref 0.30–0.70)

## 2011-09-09 MED ORDER — DEXTROSE 5 % IV SOLN
1.5000 g | INTRAVENOUS | Status: AC
  Administered 2011-09-10: 1.5 g via INTRAVENOUS
  Filled 2011-09-09: qty 1.5

## 2011-09-09 MED ORDER — HEPARIN (PORCINE) IN NACL 100-0.45 UNIT/ML-% IJ SOLN
1700.0000 [IU]/h | INTRAMUSCULAR | Status: DC
Start: 2011-09-09 — End: 2011-09-13
  Administered 2011-09-09: 1150 [IU]/h via INTRAVENOUS
  Administered 2011-09-10: 1400 [IU]/h via INTRAVENOUS
  Administered 2011-09-11 – 2011-09-12 (×2): 1500 [IU]/h via INTRAVENOUS
  Filled 2011-09-09 (×7): qty 250

## 2011-09-09 MED ORDER — DIPHENHYDRAMINE HCL 12.5 MG/5ML PO ELIX
12.5000 mg | ORAL_SOLUTION | Freq: Four times a day (QID) | ORAL | Status: DC | PRN
  Filled 2011-09-09: qty 5

## 2011-09-09 MED ORDER — MORPHINE SULFATE (PF) 1 MG/ML IV SOLN
INTRAVENOUS | Status: DC
  Administered 2011-09-09: 10.5 mg via INTRAVENOUS
  Administered 2011-09-09: 14:00:00 via INTRAVENOUS
  Administered 2011-09-09: 1 mg via INTRAVENOUS
  Administered 2011-09-10 (×2): via INTRAVENOUS
  Administered 2011-09-10: 12 mg via INTRAVENOUS
  Administered 2011-09-10: 09:00:00 via INTRAVENOUS
  Administered 2011-09-10: 21 mg via INTRAVENOUS
  Administered 2011-09-10 (×2): via INTRAVENOUS
  Administered 2011-09-11: 15 mg via INTRAVENOUS
  Administered 2011-09-11 (×2): via INTRAVENOUS
  Filled 2011-09-09 (×11): qty 25

## 2011-09-09 MED ORDER — SODIUM CHLORIDE 0.9 % IJ SOLN: 9.0000 mL | INTRAMUSCULAR | Status: DC | PRN

## 2011-09-09 MED ORDER — ONDANSETRON HCL 4 MG/2ML IJ SOLN
4.0000 mg | Freq: Four times a day (QID) | INTRAMUSCULAR | Status: DC | PRN
  Filled 2011-09-09: qty 2

## 2011-09-09 MED ORDER — NALOXONE HCL 0.4 MG/ML IJ SOLN: 0.4000 mg | INTRAMUSCULAR | Status: DC | PRN

## 2011-09-09 MED ORDER — DIPHENHYDRAMINE HCL 50 MG/ML IJ SOLN: 12.5000 mg | Freq: Four times a day (QID) | INTRAMUSCULAR | Status: DC | PRN

## 2011-09-09 NOTE — Progress Notes (Addendum)
ANTICOAGULATION CONSULT NOTE - Follow Up Consult  Pharmacy Consult for heparin Indication: arterial thrombus  Labs:  Basename 09/09/11 0912 09/09/11 0018 09/08/11 1733 09/08/11 0126 09/08/11 0124  HGB 8.9* -- -- 13.5 --  HCT 30.0* -- -- 39.7 41.0  PLT 178 -- -- 283 --  APTT -- -- -- -- --  LABPROT -- -- -- 13.1 --  INR -- -- -- 0.97 --  HEPARINUNFRC 0.14* 0.13* <0.10* -- --  CREATININE 0.94 -- -- 1.15 1.30  CKTOTAL -- -- -- -- --  CKMB -- -- -- -- --  TROPONINI -- -- -- -- --    Assessment: 41yo male remains subtherapeutic on heparin after rate increase though level is slowly increasing. No overt bleeding per nursing normal wound oozing noted. Hgb is down significantly to 8.9 this morning.  For fasciotomy closure tomorrow. Will conservatively increase heparin rate.  Goal of Therapy:  Heparin level 0.3-0.5 units/ml   Plan:  Will increase heparin gtt to 1150 units/hr and check level in 6hr.  Severiano Gilbert PharmD BCPS 09/09/2011,11:30 AM
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
## 2011-09-09 NOTE — Progress Notes (Signed)
Pt tx 2000 per MD order, Pt VSS, pt verbalized understanding of tx, foley d/c at 1200 noon, RN called report, updates given, all questions answered

## 2011-09-09 NOTE — Progress Notes (Signed)
VASCULAR LAB PRELIMINARY  ARTERIAL  ABI completed:    RIGHT    LEFT    PRESSURE WAVEFORM  PRESSURE WAVEFORM  BRACHIAL 95 Triphasic BRACHIAL 91 Triphasic  DP 122 Triphasic DP 129 Triphasic         PT 130 Triphasic PT 133 Triphasic         GREAT TOE  NA GREAT TOE  NA    RIGHT LEFT  ABI 1.37 1.40   ABIs and Doppler waveforms are within normal limits bilaterally at rest  Anelia Carriveau, RVS 09/09/2011, 2:53 PM
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
## 2011-09-09 NOTE — Progress Notes (Signed)
ANTICOAGULATION CONSULT NOTE - Follow Up Consult  Pharmacy Consult for heparin Indication: arterial thrombus  Labs:  Basename 09/09/11 0018 09/08/11 1733 09/08/11 0126 09/08/11 0124  HGB -- -- 13.5 13.9  HCT -- -- 39.7 41.0  PLT -- -- 283 --  APTT -- -- -- --  LABPROT -- -- 13.1 --  INR -- -- 0.97 --  HEPARINUNFRC 0.13* <0.10* -- --  CREATININE -- -- 1.15 1.30  CKTOTAL -- -- -- --  CKMB -- -- -- --  TROPONINI -- -- -- --    Assessment: 41yo male remains subtherapeutic on heparin after rate increase though level is increasing.  Goal of Therapy:  Heparin level 0.3-0.5 units/ml   Plan:  Will increase heparin gtt by another 3 units/kg/hr to 1000 units/hr and check level in 6hr.  Colleen Can PharmD BCPS 09/09/2011,1:18 AM

## 2011-09-09 NOTE — Progress Notes (Signed)
ANTICOAGULATION CONSULT NOTE - Follow Up Consult  Pharmacy Consult for heparin Indication: arterial thrombus  Labs:  Basename 09/09/11 2112 09/09/11 0912 09/09/11 0018 09/08/11 0126 09/08/11 0124  HGB -- 8.9* -- 13.5 --  HCT -- 30.0* -- 39.7 41.0  PLT -- 178 -- 283 --  APTT -- -- -- -- --  LABPROT -- -- -- 13.1 --  INR -- -- -- 0.97 --  HEPARINUNFRC 0.27* 0.14* 0.13* -- --  CREATININE -- 0.94 -- 1.15 1.30  CKTOTAL -- -- -- -- --  CKMB -- -- -- -- --  TROPONINI -- -- -- -- --    Assessment: 41yo male remains subtherapeutic on heparin though level has increased following dose increase this am.  No overt bleeding per nursing normal wound oozing noted.   For fasciotomy closure tomorrow.  Goal of Therapy:  Heparin level 0.3-0.5 units/ml   Plan:  -Increase heparin gtt slightly to 1200 units/hr  -F/u am level  Jill Side L. Illene Bolus, PharmD, BCPS Clinical Pharmacist Pager: [phone] 09/09/2011 10:02 PM

## 2011-09-10 ENCOUNTER — Encounter (HOSPITAL_COMMUNITY)
Admission: EM | Disposition: A | Discharge status: Home / Self Care | Payer: Self-pay | Source: Home / Self Care | Attending: Vascular Surgery

## 2011-09-10 ENCOUNTER — Encounter (HOSPITAL_COMMUNITY): Payer: Self-pay | Admitting: Anesthesiology

## 2011-09-10 ENCOUNTER — Inpatient Hospital Stay (HOSPITAL_COMMUNITY): Payer: Self-pay | Admitting: Anesthesiology

## 2011-09-10 ENCOUNTER — Encounter (HOSPITAL_COMMUNITY): Payer: Self-pay | Admitting: Vascular Surgery

## 2011-09-10 DIAGNOSIS — S85009A Unspecified injury of popliteal artery, unspecified leg, initial encounter: Secondary | ICD-10-CM

## 2011-09-10 LAB — BASIC METABOLIC PANEL
BUN: 7 mg/dL (ref 6–23)
CO2: 30 mEq/L (ref 19–32)
Calcium: 8 mg/dL — ABNORMAL LOW (ref 8.4–10.5)
Chloride: 97 mEq/L (ref 96–112)
Creatinine, Ser: 1.06 mg/dL (ref 0.50–1.35)
GFR calc Af Amer: >90 mL/min (ref >90)
GFR calc non Af Amer: 85 mL/min — ABNORMAL LOW (ref >90)
Glucose, Bld: 98 mg/dL (ref 70–99)
Potassium: 3.4 mEq/L — ABNORMAL LOW (ref 3.5–5.1)
Sodium: 133 mEq/L — ABNORMAL LOW (ref 135–145)

## 2011-09-10 LAB — CBC
HCT: 22.1 % — ABNORMAL LOW (ref 39.0–52.0)
Hemoglobin: 7.4 g/dL — ABNORMAL LOW (ref 13.0–17.0)
MCH: 31.1 pg (ref 26.0–34.0)
MCHC: 33.5 g/dL (ref 30.0–36.0)
MCV: 92.9 fL (ref 78.0–100.0)
Platelets: 175 10*3/uL (ref 150–400)
RBC: 2.38 MIL/uL — ABNORMAL LOW (ref 4.22–5.81)
RDW: 13 % (ref 11.5–15.5)
WBC: 8.2 10*3/uL (ref 4.0–10.5)

## 2011-09-10 LAB — PREPARE RBC (CROSSMATCH)

## 2011-09-10 LAB — HEPARIN LEVEL (UNFRACTIONATED): Heparin Unfractionated: 0.17 IU/mL — ABNORMAL LOW (ref 0.30–0.70)

## 2011-09-10 SURGERY — FASCIOTOMY CLOSURE
Anesthesia: General | Site: Leg Lower | Laterality: Left | Wound class: Dirty or Infected

## 2011-09-10 MED ORDER — PROPOFOL 10 MG/ML IV EMUL
INTRAVENOUS | Status: DC | PRN
  Administered 2011-09-10: 150 mg via INTRAVENOUS

## 2011-09-10 MED ORDER — 0.9 % SODIUM CHLORIDE (POUR BTL) OPTIME
TOPICAL | Status: DC | PRN
  Administered 2011-09-10: 1000 mL

## 2011-09-10 MED ORDER — FENTANYL CITRATE 0.05 MG/ML IJ SOLN
50.0000 ug | Freq: Once | INTRAMUSCULAR | Status: AC
  Administered 2011-09-10: 50 ug via INTRAVENOUS

## 2011-09-10 MED ORDER — ONDANSETRON HCL 4 MG/2ML IJ SOLN
INTRAMUSCULAR | Status: DC | PRN
  Administered 2011-09-10: 4 mg via INTRAVENOUS

## 2011-09-10 MED ORDER — HYDROMORPHONE HCL PF 1 MG/ML IJ SOLN
0.2500 mg | INTRAMUSCULAR | Status: DC | PRN
  Administered 2011-09-10 (×4): 0.5 mg via INTRAVENOUS

## 2011-09-10 MED ORDER — HYDROMORPHONE HCL PF 1 MG/ML IJ SOLN
INTRAMUSCULAR | Status: AC
  Administered 2011-09-10: 0.5 mg via INTRAVENOUS
  Filled 2011-09-10: qty 1

## 2011-09-10 MED ORDER — FUROSEMIDE 10 MG/ML IJ SOLN
20.0000 mg | Freq: Once | INTRAMUSCULAR | Status: DC
  Filled 2011-09-10: qty 2

## 2011-09-10 MED ORDER — SUCCINYLCHOLINE CHLORIDE 20 MG/ML IJ SOLN
INTRAMUSCULAR | Status: DC | PRN
  Administered 2011-09-10: 100 mg via INTRAVENOUS

## 2011-09-10 MED ORDER — ONDANSETRON HCL 4 MG/2ML IJ SOLN: 4.0000 mg | Freq: Once | INTRAMUSCULAR | Status: DC | PRN

## 2011-09-10 MED ORDER — FENTANYL CITRATE 0.05 MG/ML IJ SOLN
INTRAMUSCULAR | Status: DC | PRN
  Administered 2011-09-10: 150 ug via INTRAVENOUS

## 2011-09-10 MED ORDER — FENTANYL CITRATE 0.05 MG/ML IJ SOLN
INTRAMUSCULAR | Status: AC
  Filled 2011-09-10: qty 2

## 2011-09-10 MED ORDER — LACTATED RINGERS IV SOLN
INTRAVENOUS | Status: DC | PRN
  Administered 2011-09-10: 14:00:00 via INTRAVENOUS

## 2011-09-10 MED ORDER — FUROSEMIDE 10 MG/ML IJ SOLN
20.0000 mg | Freq: Once | INTRAMUSCULAR | Status: AC
  Administered 2011-09-11: 20 mg via INTRAVENOUS
  Filled 2011-09-10: qty 2

## 2011-09-10 MED ORDER — HYDROMORPHONE HCL PF 1 MG/ML IJ SOLN
0.5000 mg | INTRAMUSCULAR | Status: AC | PRN
  Administered 2011-09-10 (×2): 0.5 mg via INTRAVENOUS

## 2011-09-10 MED ORDER — MIDAZOLAM HCL 5 MG/5ML IJ SOLN
INTRAMUSCULAR | Status: DC | PRN
  Administered 2011-09-10: 2 mg via INTRAVENOUS

## 2011-09-10 MED ORDER — POTASSIUM CHLORIDE CRYS ER 20 MEQ PO TBCR
20.0000 meq | EXTENDED_RELEASE_TABLET | Freq: Two times a day (BID) | ORAL | Status: AC
  Administered 2011-09-10 (×2): 20 meq via ORAL
  Filled 2011-09-10 (×2): qty 1

## 2011-09-10 SURGICAL SUPPLY — 32 items
BANDAGE ELASTIC 4 VELCRO ST LF (GAUZE/BANDAGES/DRESSINGS) IMPLANT
BANDAGE ELASTIC 6 VELCRO ST LF (GAUZE/BANDAGES/DRESSINGS) IMPLANT
BANDAGE GAUZE ELAST BULKY 4 IN (GAUZE/BANDAGES/DRESSINGS) IMPLANT
CANISTER SUCTION 2500CC (MISCELLANEOUS) ×2 IMPLANT
CLOTH BEACON ORANGE TIMEOUT ST (SAFETY) ×2 IMPLANT
COVER SURGICAL LIGHT HANDLE (MISCELLANEOUS) ×4 IMPLANT
ELECT REM PT RETURN 9FT ADLT (ELECTROSURGICAL) ×2
ELECTRODE REM PT RTRN 9FT ADLT (ELECTROSURGICAL) ×1 IMPLANT
GLOVE BIO SURGEON STRL SZ7.5 (GLOVE) ×2 IMPLANT
GLOVE BIOGEL M 6.5 STRL (GLOVE) ×2 IMPLANT
GLOVE BIOGEL PI IND STRL 6.5 (GLOVE) IMPLANT
GLOVE BIOGEL PI INDICATOR 6.5 (GLOVE) ×2
GLOVE ORTHOPEDIC STR SZ6.5 (GLOVE) ×1 IMPLANT
GOWN PREVENTION PLUS XLARGE (GOWN DISPOSABLE) ×2 IMPLANT
GOWN STRL NON-REIN LRG LVL3 (GOWN DISPOSABLE) ×4 IMPLANT
KIT BASIN OR (CUSTOM PROCEDURE TRAY) ×2 IMPLANT
KIT ROOM TURNOVER OR (KITS) ×2 IMPLANT
NS IRRIG 1000ML POUR BTL (IV SOLUTION) ×2 IMPLANT
PACK GENERAL/GYN (CUSTOM PROCEDURE TRAY) ×1 IMPLANT
PACK UNIVERSAL I (CUSTOM PROCEDURE TRAY) ×1 IMPLANT
PAD ARMBOARD 7.5X6 YLW CONV (MISCELLANEOUS) ×4 IMPLANT
SPONGE GAUZE 4X4 12PLY (GAUZE/BANDAGES/DRESSINGS) ×3 IMPLANT
STAPLER VISISTAT 35W (STAPLE) IMPLANT
SUT ETHILON 3 0 PS 1 (SUTURE) IMPLANT
SUT VIC AB 2-0 CTX 36 (SUTURE) ×1 IMPLANT
SUT VIC AB 3-0 SH 27 (SUTURE) ×2
SUT VIC AB 3-0 SH 27X BRD (SUTURE) IMPLANT
SUT VICRYL 4-0 PS2 18IN ABS (SUTURE) IMPLANT
TAPE CLOTH SURG 4X10 WHT LF (GAUZE/BANDAGES/DRESSINGS) ×1 IMPLANT
TOWEL OR 17X24 6PK STRL BLUE (TOWEL DISPOSABLE) ×2 IMPLANT
TOWEL OR 17X26 10 PK STRL BLUE (TOWEL DISPOSABLE) ×2 IMPLANT
WATER STERILE IRR 1000ML POUR (IV SOLUTION) ×2 IMPLANT

## 2011-09-10 NOTE — Preoperative (Signed)
Beta Blockers   Reason not to administer Beta Blockers:Not Applicable. No home beta blockers 
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
## 2011-09-10 NOTE — Anesthesia Postprocedure Evaluation (Signed)
Anesthesia Post Note  Patient: Roberto Knapp  Procedure(s) Performed: Procedure(s) (LRB): FASCIOTOMY CLOSURE (Left)  Anesthesia type: general  Patient location: PACU  Post pain: Pain level controlled  Post assessment: Patient's Cardiovascular Status Stable  Last Vitals:  Filed Vitals:   09/10/11 1745  BP: 96/68  Pulse: 67  Temp:   Resp: 11    Post vital signs: Reviewed and stable  Level of consciousness: sedated  Complications: No apparent anesthesia complications

## 2011-09-10 NOTE — Progress Notes (Signed)
D/ced heparin per Dr. Darrick Penna.

## 2011-09-10 NOTE — Progress Notes (Signed)
ANTICOAGULATION CONSULT NOTE - Follow Up Consult  Pharmacy Consult for heparin Indication: arterial thrombus  Labs:  Basename 09/10/11 0618 09/09/11 2112 09/09/11 0912 09/08/11 0126 09/08/11 0124  HGB 7.4* -- 8.9* -- --  HCT 22.1* -- 30.0* 39.7 --  PLT 175 -- 178 283 --  APTT -- -- -- -- --  LABPROT -- -- -- 13.1 --  INR -- -- -- 0.97 --  HEPARINUNFRC 0.17* 0.27* 0.14* -- --  CREATININE -- -- 0.94 1.15 1.30  CKTOTAL -- -- -- -- --  CKMB -- -- -- -- --  TROPONINI -- -- -- -- --    Assessment: 41yo male now with decreased heparin level despite increased rate; per RN no infusion issues though pt has been receiving a lot of fluid.  Goal of Therapy:  Heparin level 0.3-0.5 units/ml   Plan:  Will increase heparin gtt by 3 units/kg/hr to 1400 units/hr and check level in 6hr.  Colleen Can PharmD BCPS 09/10/2011,7:07 AM
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
## 2011-09-10 NOTE — Anesthesia Procedure Notes (Signed)
Procedure Name: Intubation Date/Time: 09/10/2011 2:47 PM Performed by: Lunden Stieber S Pre-anesthesia Checklist: Patient identified, Emergency Drugs available, Suction available, Patient being monitored and Timeout performed Patient Re-evaluated:Patient Re-evaluated prior to inductionOxygen Delivery Method: Circle system utilized Preoxygenation: Pre-oxygenation with 100% oxygen Intubation Type: IV induction Ventilation: Mask ventilation without difficulty Laryngoscope Size: Mac and 4 Grade View: Grade I Tube type: Oral Tube size: 7.5 mm Number of attempts: 1 Airway Equipment and Method: Stylet Placement Confirmation: ETT inserted through vocal cords under direct vision,  positive ETCO2 and breath sounds checked- equal and bilateral Secured at: 22 cm Tube secured with: Tape Dental Injury: Teeth and Oropharynx as per pre-operative assessment
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
## 2011-09-10 NOTE — Transfer of Care (Signed)
Immediate Anesthesia Transfer of Care Note  Patient: Roberto Knapp  Procedure(s) Performed: Procedure(s) (LRB): FASCIOTOMY CLOSURE (Left)  Patient Location: PACU  Anesthesia Type: General  Level of Consciousness: awake, alert  and oriented  Airway & Oxygen Therapy: Patient Spontanous Breathing and Patient connected to nasal cannula oxygen  Post-op Assessment: Report given to PACU RN and Post -op Vital signs reviewed and stable  Post vital signs: Reviewed and stable  Complications: No apparent anesthesia complications

## 2011-09-10 NOTE — Op Note (Signed)
Procedure: Closure of left leg fasciotomies  Preoperative diagnosis: Open wound left leg  Postoperative diagnosis: Same  Anesthesia Gen.  Assistant: Nurse  Operative details: After obtaining informed consent, the patient was taken to the operating room. The patient was placed in supine position on operating table. After induction of general anesthesia the patient's left lower extremity was prepped and draped in the usual sterile fashion. The lateral and medial fasciotomy wounds were inspected. The muscle was pink and viable. There was essentially no tension on the skin edges. I then proceeded to irrigate both wounds thoroughly with a liter of normal saline solution. The skin edges were then reapproximated with staples. The patient tolerated procedure well and there were no complications. Instrument sponge and needle counts were correct at the end of the case. The patient was taken to the recovery room in stable condition.  Nicci Vaughan, MD [facility] [place] Office: [phone] Pager: [phone]  

## 2011-09-10 NOTE — Progress Notes (Signed)
OT Cancellation Note  OT session cancelled today due to pt with 10/10 pain despite PCA use.  Will re-attempt tomorrow as pt is having surgery this afternoon (fasciotomy closure).  Thanks.  09/10/2011 Cipriano Mile OTR/L Pager [phone] Office [phone]

## 2011-09-10 NOTE — Anesthesia Preprocedure Evaluation (Addendum)
Anesthesia Evaluation  Patient identified by MRN, date of birth, ID band Patient awake    Reviewed: Allergy & Precautions, H&P , NPO status , Patient's Chart, lab work & pertinent test results, reviewed documented beta blocker date and time   Airway Mallampati: I TM Distance: >3 FB Neck ROM: Full    Dental  (+) Teeth Intact and Dental Advisory Given   Pulmonary neg pulmonary ROS,          Cardiovascular negative cardio ROS      Neuro/Psych    GI/Hepatic   Endo/Other    Renal/GU      Musculoskeletal   Abdominal   Peds  Hematology   Anesthesia Other Findings   Reproductive/Obstetrics                          Anesthesia Physical Anesthesia Plan  ASA: II  Anesthesia Plan: General   Post-op Pain Management:    Induction: Intravenous  Airway Management Planned: Oral ETT  Additional Equipment:   Intra-op Plan:   Post-operative Plan: Extubation in OR  Informed Consent: I have reviewed the patients History and Physical, chart, labs and discussed the procedure including the risks, benefits and alternatives for the proposed anesthesia with the patient or authorized representative who has indicated his/her understanding and acceptance.   Dental advisory given  Plan Discussed with: CRNA and Surgeon  Anesthesia Plan Comments:        Anesthesia Quick Evaluation

## 2011-09-11 LAB — CBC
HCT: 29.3 % — ABNORMAL LOW (ref 39.0–52.0)
HCT: 27.4 % — ABNORMAL LOW (ref 39.0–52.0)
Hemoglobin: 10.1 g/dL — ABNORMAL LOW (ref 13.0–17.0)
Hemoglobin: 9.5 g/dL — ABNORMAL LOW (ref 13.0–17.0)
MCH: 30.8 pg (ref 26.0–34.0)
MCH: 30.9 pg (ref 26.0–34.0)
MCHC: 34.5 g/dL (ref 30.0–36.0)
MCHC: 34.7 g/dL (ref 30.0–36.0)
MCV: 89.3 fL (ref 78.0–100.0)
MCV: 89.3 fL (ref 78.0–100.0)
Platelets: 188 10*3/uL (ref 150–400)
Platelets: 179 10*3/uL (ref 150–400)
RBC: 3.28 MIL/uL — ABNORMAL LOW (ref 4.22–5.81)
RBC: 3.07 MIL/uL — ABNORMAL LOW (ref 4.22–5.81)
RDW: 13.7 % (ref 11.5–15.5)
RDW: 13.6 % (ref 11.5–15.5)
WBC: 8.5 10*3/uL (ref 4.0–10.5)
WBC: 7.1 10*3/uL (ref 4.0–10.5)

## 2011-09-11 LAB — TYPE AND SCREEN
ABO/RH(D): A POS
Antibody Screen: NEGATIVE
Unit division: 0
Unit division: 0
Unit division: 0
Unit division: 0

## 2011-09-11 LAB — HEPARIN LEVEL (UNFRACTIONATED)
Heparin Unfractionated: 0.3 IU/mL (ref 0.30–0.70)
Heparin Unfractionated: 0.27 IU/mL — ABNORMAL LOW (ref 0.30–0.70)
Heparin Unfractionated: 0.41 IU/mL (ref 0.30–0.70)

## 2011-09-11 LAB — BASIC METABOLIC PANEL
BUN: 6 mg/dL (ref 6–23)
CO2: 33 mEq/L — ABNORMAL HIGH (ref 19–32)
Calcium: 8.6 mg/dL (ref 8.4–10.5)
Chloride: 95 mEq/L — ABNORMAL LOW (ref 96–112)
Creatinine, Ser: 0.95 mg/dL (ref 0.50–1.35)
GFR calc Af Amer: >90 mL/min (ref >90)
GFR calc non Af Amer: >90 mL/min (ref >90)
Glucose, Bld: 99 mg/dL (ref 70–99)
Potassium: 3.8 mEq/L (ref 3.5–5.1)
Sodium: 135 mEq/L (ref 135–145)

## 2011-09-11 MED ORDER — PATIENT'S GUIDE TO USING COUMADIN BOOK
Freq: Once | Status: AC
  Administered 2011-09-11: 10:00:00
  Filled 2011-09-11: qty 1

## 2011-09-11 MED ORDER — MORPHINE SULFATE (PF) 1 MG/ML IV SOLN
INTRAVENOUS | Status: DC
  Administered 2011-09-11 (×3): via INTRAVENOUS
  Administered 2011-09-12: 16 mg via INTRAVENOUS
  Administered 2011-09-12 (×3): via INTRAVENOUS
  Administered 2011-09-12: 1 mg via INTRAVENOUS
  Administered 2011-09-12: 15 mg via INTRAVENOUS
  Administered 2011-09-13: 22 mg via INTRAVENOUS
  Administered 2011-09-13: 11.4 mg via INTRAVENOUS
  Administered 2011-09-13: 10 mg via INTRAVENOUS
  Filled 2011-09-11 (×9): qty 25

## 2011-09-11 MED ORDER — WARFARIN VIDEO
Freq: Once | Status: AC
  Administered 2011-09-13: 10:00:00

## 2011-09-11 MED ORDER — WARFARIN - PHARMACIST DOSING INPATIENT
Freq: Every day | Status: DC
  Administered 2011-09-12: 18:00:00

## 2011-09-11 MED ORDER — WARFARIN SODIUM 7.5 MG PO TABS
7.5000 mg | ORAL_TABLET | Freq: Once | ORAL | Status: AC
  Administered 2011-09-11: 7.5 mg via ORAL
  Filled 2011-09-11: qty 1

## 2011-09-11 MED ORDER — ONDANSETRON HCL 4 MG/2ML IJ SOLN
4.0000 mg | Freq: Four times a day (QID) | INTRAMUSCULAR | Status: DC | PRN
  Administered 2011-09-11 – 2011-09-12 (×2): 4 mg via INTRAVENOUS
  Filled 2011-09-11 (×2): qty 2
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
## 2011-09-11 NOTE — Progress Notes (Signed)
ANTICOAGULATION CONSULT NOTE - Follow Up Consult  Pharmacy Consult for heparin Indication: arterial thrombus  Labs:  Basename 09/11/11 0056 09/10/11 0618 09/09/11 2112 09/09/11 0912  HGB 10.1* 7.4* -- --  HCT 29.3* 22.1* -- 30.0*  PLT 188 175 -- 178  APTT -- -- -- --  LABPROT -- -- -- --  INR -- -- -- --  HEPARINUNFRC 0.30 0.17* 0.27* --  CREATININE -- 1.06 -- 0.94  CKTOTAL -- -- -- --  CKMB -- -- -- --  TROPONINI -- -- -- --    Assessment/Plan: 41yo male now therapeutic on heparin after resumed post-op.  Will continue gtt at current rate and confirm stable with am labs.  Colleen Can PharmD BCPS 09/11/2011,1:54 AM

## 2011-09-11 NOTE — Progress Notes (Signed)
ANTICOAGULATION CONSULT NOTE - Follow Up Consult  Pharmacy Consult for heparin Indication: arterial thrombus  Labs:  Basename 09/11/11 2216 09/11/11 0810 09/11/11 0056 09/10/11 0618 09/09/11 0912  HGB -- 9.5* 10.1* -- --  HCT -- 27.4* 29.3* 22.1* --  PLT -- 179 188 175 --  APTT -- -- -- -- --  LABPROT -- -- -- -- --  INR -- -- -- -- --  HEPARINUNFRC 0.41 0.27* 0.30 -- --  CREATININE -- -- 0.95 1.06 0.94  CKTOTAL -- -- -- -- --  CKMB -- -- -- -- --  TROPONINI -- -- -- -- --    Assessment/Plan: 41yo male now therapeutic on heparin after rate increase.  Will continue gtt at current rate and confirm stable with am labs.  Colleen Can PharmD BCPS 09/11/2011,11:16 PM

## 2011-09-11 NOTE — Progress Notes (Signed)
Occupational Therapy Treatment Patient Details Name: Cher Egnor MRN: [id number] DOB: 1971/03/01 Today's Date: 09/11/2011 Time: 8469-6295 OT Time Calculation (min): 32 min  OT Assessment / Plan / Recommendation Comments on Treatment Session This 41 yo male making progress, but limited by pain in LLE.    Follow Up Recommendations  Home health OT;Supervision/Assistance - 24 hour    Barriers to Discharge       Equipment Recommendations  Rolling walker with 5" wheels;3 in 1 bedside comode    Recommendations for Other Services    Frequency Min 2X/week   Plan Discharge plan remains appropriate    Precautions / Restrictions Precautions Precautions: Fall Restrictions Weight Bearing Restrictions: Yes LLE Weight Bearing: Touchdown weight bearing   Pertinent Vitals/Pain 8/10 LLE    ADL  Lower Body Dressing: Performed;Moderate assistance (with left sock, can doff but not don) Where Assessed - Lower Body Dressing: Supported sitting Toilet Transfer: Simulated;Min Pension scheme manager Method: Sit to Barista:  (recliner to bed next to each other with RW) Equipment Used: Gait belt;Rolling walker    OT Diagnosis:    OT Problem List:   OT Treatment Interventions:     OT Goals ADL Goals ADL Goal: Lower Body Dressing - Progress: Progressing toward goals ADL Goal: Toilet Transfer - Progress: Progressing toward goals ADL Goal: Additional Goal #1 - Progress: Progressing toward goals  Visit Information  Last OT Received On: 09/11/11 Assistance Needed: +1    Subjective Data      Prior Functioning       Cognition  Overall Cognitive Status: Appears within functional limits for tasks assessed/performed Arousal/Alertness: Awake/alert Orientation Level: Appears intact for tasks assessed Behavior During Session: [facility] for tasks performed    Mobility Bed Mobility Bed Mobility: Sit to Supine Supine to Sit: 4: Min guard;With rails;HOB elevated (HOB  20degrees) Sitting - Scoot to Edge of Bed: 4: Min guard Sit to Supine: 4: Min assist;HOB flat (for LLE) Details for Bed Mobility Assistance: Pt able to use RLE to assist LLE to edge of bed today with cueing for sequence, increased time and use of rail. Guarding to prevent left leg from dropping during mobility.  Transfers Transfers: Sit to Stand;Stand to Sit Sit to Stand: 4: Min guard;With armrests;From chair/3-in-1;With upper extremity assist Stand to Sit: With upper extremity assist;To bed Details for Transfer Assistance: cueing for hand placement, sequence and assist to position LLE in front of pt with transfers to prevent weight bearing   Exercises    Balance    End of Session OT - End of Session Equipment Utilized During Treatment: Gait belt Activity Tolerance: Patient limited by pain Patient left: in bed;with call bell/phone within reach;with bed alarm set       Evette Georges [phone] 09/11/2011, 2:36 PM

## 2011-09-11 NOTE — Progress Notes (Addendum)
[facility] Progress Note  09/11/2011 7:25 AM POD 3/1  Subjective:  C/o "bone pain"  Tm 99.7 now 98.3   90's - 100s systolic  100%2LO2NC Filed Vitals:   09/11/11 0406  BP:   Pulse:   Temp:   Resp: 16    Physical Exam: Incisions:  All incisions are c/d/i with staples in tact. Extremities:  + doppler signal left DP/PT; exit wound still with serosanguinous drainage, but does not appear to be bloody.  CBC    Component Value Date/Time   WBC 8.5 09/11/2011 0056   RBC 3.28* 09/11/2011 0056   HGB 10.1* 09/11/2011 0056   HCT 29.3* 09/11/2011 0056   PLT 188 09/11/2011 0056   MCV 89.3 09/11/2011 0056   MCH 30.8 09/11/2011 0056   MCHC 34.5 09/11/2011 0056   RDW 13.7 09/11/2011 0056    BMET    Component Value Date/Time   NA 135 09/11/2011 0056   K 3.8 09/11/2011 0056   CL 95* 09/11/2011 0056   CO2 33* 09/11/2011 0056   GLUCOSE 99 09/11/2011 0056   BUN 6 09/11/2011 0056   CREATININE 0.95 09/11/2011 0056   CALCIUM 8.6 09/11/2011 0056   GFRNONAA >90 09/11/2011 0056   GFRAA >90 09/11/2011 0056    INR    Component Value Date/Time   INR 0.97 09/08/2011 0126     Intake/Output Summary (Last 24 hours) at 09/11/11 0725 Last data filed at 09/11/11 0415  Gross per 24 hour  Intake   3165 ml  Output   4805 ml  Net  -1640 ml     Assessment/Plan:  41 y.o. male is s/p Repair left above-knee popliteal artery, repair left above-knee popliteal vein, 4 compartment fasciotomy left leg and closure of fasciotomies POD 3/1  -hgb improved after 2 PRBCs yesterday -will start coumadin today per pharmacy; continue heparin -check labs in am -wean PCA as tolerated.  Will cut back to half dose and start po pain medication. -continue PT-d/w pt importance of mobilizing. Per ortho-Continue touchdown weightbearing left lower extremity with physical therapy.    Doreatha Massed, PA-C [facility] [phone] 09/11/2011 7:25 AM   Triphasic PT and DP doppler Fasciotomy wounds clean  Needs  coumadin for 6 weeks for vein repair will need to establish physician to check INR Wean pain meds Cont PT Cont wound care Home when pain controlled and clears PT  Fabienne Bruns, MD [facility] [place] Office: [phone] Pager: [phone]

## 2011-09-11 NOTE — Plan of Care (Signed)
Problem: Phase I Progression Outcomes Goal: Pain controlled with appropriate interventions Outcome: Not Met (add Reason) On pca morphine an doxyocodone
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
## 2011-09-12 LAB — PROTIME-INR
INR: 1.03 (ref 0.00–1.49)
Prothrombin Time: 13.7 seconds (ref 11.6–15.2)

## 2011-09-12 LAB — CBC
HCT: 27.3 % — ABNORMAL LOW (ref 39.0–52.0)
Hemoglobin: 9.6 g/dL — ABNORMAL LOW (ref 13.0–17.0)
MCH: 31.8 pg (ref 26.0–34.0)
MCHC: 35.2 g/dL (ref 30.0–36.0)
MCV: 90.4 fL (ref 78.0–100.0)
Platelets: 221 10*3/uL (ref 150–400)
RBC: 3.02 MIL/uL — ABNORMAL LOW (ref 4.22–5.81)
RDW: 13.5 % (ref 11.5–15.5)
WBC: 5 10*3/uL (ref 4.0–10.5)

## 2011-09-12 LAB — HEPARIN LEVEL (UNFRACTIONATED): Heparin Unfractionated: 0.44 IU/mL (ref 0.30–0.70)

## 2011-09-12 MED ORDER — CYCLOBENZAPRINE HCL 10 MG PO TABS
5.0000 mg | ORAL_TABLET | Freq: Three times a day (TID) | ORAL | Status: DC | PRN
  Administered 2011-09-12 – 2011-09-15 (×9): 5 mg via ORAL
  Administered 2011-09-15: 08:00:00 via ORAL
  Administered 2011-09-16 (×2): 5 mg via ORAL
  Filled 2011-09-12: qty 2
  Filled 2011-09-12 (×3): qty 1
  Filled 2011-09-12: qty 2
  Filled 2011-09-12 (×2): qty 1
  Filled 2011-09-12 (×2): qty 0.5
  Filled 2011-09-12 (×6): qty 1

## 2011-09-12 MED ORDER — WARFARIN SODIUM 10 MG PO TABS
10.0000 mg | ORAL_TABLET | Freq: Once | ORAL | Status: AC
  Administered 2011-09-12: 10 mg via ORAL
  Filled 2011-09-12: qty 1
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
## 2011-09-12 NOTE — Progress Notes (Signed)
Patient used 19mg  of his reduced morphine pump at 0044. Will continue to monitor.

## 2011-09-12 NOTE — Progress Notes (Signed)
PCA morphine syringe changed twice during shift. 2 RN's verified waste each time. Huntington Leverich, Chrystine Oiler

## 2011-09-12 NOTE — Progress Notes (Signed)
Patient has had a sad and tearful demeanor during my shift. He expresses that he is depressed due to his lack of support, unsure of where he will go upon discharge and he is worried about finances. Please discuss with patient.
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
## 2011-09-12 NOTE — Progress Notes (Signed)
Physical Therapy Treatment Patient Details Name: Roberto Knapp MRN: [id number] DOB: [birth year] Today's Date: 09/12/2011 Time: 1478-2956 PT Time Calculation (min): 25 min  PT Assessment / Plan / Recommendation Comments on Treatment Session  Pain continues to be a limiting factor. Nature of pain seems to be a combination of musculoskeletal and nerve pain.  Pt able to begin mild aarom to L LE.  Expect slow progress.    Follow Up Recommendations  Home health PT;Supervision/Assistance - 24 hour    Barriers to Discharge        Equipment Recommendations  Rolling walker with 5" wheels;3 in 1 bedside comode    Recommendations for Other Services    Frequency     Plan Discharge plan remains appropriate;Frequency remains appropriate    Precautions / Restrictions Precautions Precautions: Fall Restrictions LLE Weight Bearing: Touchdown weight bearing   Pertinent Vitals/Pain Pain 10/10 as we neared 6-7 feet with RW O2 sats RA 97-100%    Mobility  Bed Mobility Bed Mobility: Not assessed Transfers Transfers: Sit to Stand;Stand to Sit Sit to Stand: 4: Min guard;With armrests;From chair/3-in-1;With upper extremity assist Stand to Sit: With upper extremity assist;To chair/3-in-1 Details for Transfer Assistance: vc's for safety issues Ambulation/Gait Ambulation/Gait Assistance: 4: Min guard Ambulation Distance (Feet): 7 Feet (over 6-7 min) Assistive device: Rolling walker Ambulation/Gait Assistance Details: step to pattern with TDWB LLE, pt able to tolerate toes touching floor today and required cueing for sequence as well as to look up.  Pain increased with time up in stance. Gait Pattern: Step-to pattern Stairs: No    Exercises General Exercises - Lower Extremity Long Arc Quad: AAROM;Left;10 reps;Other (comment) (in available tolerable range)   PT Diagnosis:    PT Problem List:   PT Treatment Interventions:     PT Goals Acute Rehab PT Goals Time For Goal Achievement:  09/23/11 Potential to Achieve Goals: Good PT Goal: Sit to Stand - Progress: Progressing toward goal PT Goal: Stand to Sit - Progress: Progressing toward goal PT Transfer Goal: Bed to Chair/Chair to Bed - Progress: Progressing toward goal PT Goal: Ambulate - Progress: Other (comment) (very slowed progress)  Visit Information  Last PT Received On: 09/12/11 Assistance Needed: +1    Subjective Data  Subjective: I'm sorry I'm such a.... Patient Stated Goal: pain less, be able to manage   Cognition  Overall Cognitive Status: Appears within functional limits for tasks assessed/performed Arousal/Alertness: Awake/alert Orientation Level: Appears intact for tasks assessed Behavior During Session: [facility] for tasks performed    Balance     End of Session PT - End of Session Activity Tolerance: Patient limited by pain;Other (comment) Patient left: in chair;with call bell/phone within reach Nurse Communication: Mobility status   GP     Roberto Knapp, Roberto Knapp 09/12/2011, 4:25 PM

## 2011-09-13 LAB — CBC
HCT: 26.4 % — ABNORMAL LOW (ref 39.0–52.0)
Hemoglobin: 9.2 g/dL — ABNORMAL LOW (ref 13.0–17.0)
MCH: 31.3 pg (ref 26.0–34.0)
MCHC: 34.8 g/dL (ref 30.0–36.0)
MCV: 89.8 fL (ref 78.0–100.0)
Platelets: 248 10*3/uL (ref 150–400)
RBC: 2.94 MIL/uL — ABNORMAL LOW (ref 4.22–5.81)
RDW: 13.4 % (ref 11.5–15.5)
WBC: 5.2 10*3/uL (ref 4.0–10.5)

## 2011-09-13 LAB — PROTIME-INR
INR: 1.22 (ref 0.00–1.49)
Prothrombin Time: 15.7 seconds — ABNORMAL HIGH (ref 11.6–15.2)

## 2011-09-13 LAB — HEPARIN LEVEL (UNFRACTIONATED)
Heparin Unfractionated: <0.1 IU/mL — ABNORMAL LOW (ref 0.30–0.70)
Heparin Unfractionated: 0.2 IU/mL — ABNORMAL LOW (ref 0.30–0.70)
Heparin Unfractionated: 0.43 IU/mL (ref 0.30–0.70)

## 2011-09-13 MED ORDER — ALPRAZOLAM 0.25 MG PO TABS
0.2500 mg | ORAL_TABLET | Freq: Three times a day (TID) | ORAL | Status: DC | PRN
  Administered 2011-09-13 – 2011-09-16 (×5): 0.25 mg via ORAL
  Filled 2011-09-13 (×5): qty 1

## 2011-09-13 MED ORDER — HEPARIN (PORCINE) IN NACL 100-0.45 UNIT/ML-% IJ SOLN
1900.0000 [IU]/h | INTRAMUSCULAR | Status: DC
  Administered 2011-09-13: 1850 [IU]/h via INTRAVENOUS
  Administered 2011-09-14 – 2011-09-15 (×3): 1900 [IU]/h via INTRAVENOUS
  Filled 2011-09-13 (×7): qty 250

## 2011-09-13 MED ORDER — HEPARIN (PORCINE) IN NACL 100-0.45 UNIT/ML-% IJ SOLN
1700.0000 [IU]/h | INTRAMUSCULAR | Status: DC
  Administered 2011-09-13 (×2): 1700 [IU]/h via INTRAVENOUS
  Filled 2011-09-13 (×2): qty 250

## 2011-09-13 MED ORDER — NICOTINE 21 MG/24HR TD PT24: 1.0000 | MEDICATED_PATCH | Freq: Every day | TRANSDERMAL | Status: DC

## 2011-09-13 MED ORDER — CYCLOBENZAPRINE HCL 5 MG PO TABS: 5.0000 mg | ORAL_TABLET | Freq: Three times a day (TID) | ORAL | Status: DC | PRN

## 2011-09-13 MED ORDER — WARFARIN SODIUM 7.5 MG PO TABS
7.5000 mg | ORAL_TABLET | Freq: Once | ORAL | Status: DC
  Filled 2011-09-13: qty 1

## 2011-09-13 MED ORDER — OXYCODONE HCL 5 MG PO TABS: 5.0000 mg | ORAL_TABLET | Freq: Four times a day (QID) | ORAL | Status: DC | PRN
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
## 2011-09-13 NOTE — Progress Notes (Signed)
Occupational Therapy Treatment Patient Details Name: Roberto Knapp MRN: [id number] DOB: 10/06/1970 Today's Date: 09/13/2011 Time: 0820-0907 OT Time Calculation (min): 47 min  OT Assessment / Plan / Recommendation Comments on Treatment Session Pain continues to be a limiting factor in mobility and ADL, but progressing steadily.    Follow Up Recommendations  Home health OT;Supervision/Assistance - 24 hour    Barriers to Discharge       Equipment Recommendations  Rolling walker with 5" wheels;3 in 1 bedside comode    Recommendations for Other Services    Frequency Min 2X/week   Plan Discharge plan remains appropriate    Precautions / Restrictions Precautions Precautions: Fall Restrictions Weight Bearing Restrictions: Yes LLE Weight Bearing: Touchdown weight bearing   Pertinent Vitals/Pain 7/10, RN aware    ADL  Grooming: Performed;Wash/dry hands;Wash/dry face;Set up Where Assessed - Grooming: Unsupported sitting Lower Body Bathing: Simulated;Other (comment);Min guard (instructed in use of long sponge and issued) Where Assessed - Lower Body Bathing: Supported sit to stand Upper Body Dressing: Performed;Minimal assistance;Other (comment) (due to lines) Where Assessed - Upper Body Dressing: Unsupported sitting Lower Body Dressing: Performed;Min guard;Other (comment) (instructed and issued reacher, sock aid, long shoe horn) Where Assessed - Lower Body Dressing: Unsupported sitting;Supported sit to stand Toilet Transfer: Performed;Min guard Toilet Transfer Method: Sit to Barista: Bedside commode Equipment Used: Gait belt;Rolling walker Transfers/Ambulation Related to ADLs: min guard assist with RW, good adherence to TDWB on L ADL Comments: Pt appreciative of AE for LB ADL.  Will not have anyone to rely on for self care.    OT Diagnosis:    OT Problem List:   OT Treatment Interventions:     OT Goals Acute Rehab OT Goals OT Goal Formulation: With  patient Time For Goal Achievement: 09/23/11 Potential to Achieve Goals: Good ADL Goals Pt Will Perform Grooming: with modified independence;Sitting at sink;Standing at sink ADL Goal: Grooming - Progress: Progressing toward goals Pt Will Perform Lower Body Bathing: with modified independence;Sit to stand from bed;Sit to stand from chair;with adaptive equipment ADL Goal: Lower Body Bathing - Progress: Progressing toward goals Pt Will Perform Lower Body Dressing: with modified independence;Sit to stand from chair;Sit to stand from bed;with adaptive equipment ADL Goal: Lower Body Dressing - Progress: Progressing toward goals Pt Will Transfer to Toilet: with modified independence;Ambulation;with DME;Comfort height toilet;Maintaining weight bearing status ADL Goal: Toilet Transfer - Progress: Progressing toward goals Pt Will Perform Toileting - Clothing Manipulation: with modified independence;Sitting on 3-in-1 or toilet;Standing ADL Goal: Toileting - Clothing Manipulation - Progress: Progressing toward goals Pt Will Perform Toileting - Hygiene: with modified independence;Standing at 3-in-1/toilet;Sit to stand from 3-in-1/toilet ADL Goal: Toileting - Hygiene - Progress: Progressing toward goals Additional ADL Goal #1: Pt will perform bed mobility with mod I in prep for EOB ADLs. ADL Goal: Additional Goal #1 - Progress: Met  Visit Information  Last OT Received On: 09/13/11 Assistance Needed: +1    Subjective Data      Prior Functioning       Cognition  Overall Cognitive Status: Appears within functional limits for tasks assessed/performed Arousal/Alertness: Awake/alert Orientation Level: Appears intact for tasks assessed Behavior During Session: [facility] for tasks performed    Mobility Bed Mobility Bed Mobility: Supine to Sit;Sitting - Scoot to Edge of Bed Supine to Sit: 6: Modified independent (Device/Increase time);HOB elevated;With rails Sitting - Scoot to Edge of Bed: 6: Modified  independent (Device/Increase time);With rail Details for Bed Mobility Assistance: Uses hands or R leg to help  move the L leg. Transfers Sit to Stand: 5: Supervision;From bed;With upper extremity assist Stand to Sit: 5: Supervision;To chair/3-in-1;With armrests   Exercises    Balance    End of Session OT - End of Session Activity Tolerance: Patient limited by pain Patient left: in chair;with call bell/phone within reach Nurse Communication: Mobility status  GO     Evern Bio 09/13/2011, 10:18 AM [phone]
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
## 2011-09-13 NOTE — Progress Notes (Addendum)
[facility] Progress Note  09/13/2011 7:18 AM POD 5/3  Subjective:  Upset this am and with uncontrolled pain issues.  Afebrile x 24 hrs  97%2LO2NC   90-100 Systolic Filed Vitals:   09/13/11 0453  BP: 90/52  Pulse: 76  Temp: 98.5 F (36.9 C)  Resp: 16    Physical Exam: Incisions:  All are c/d/i with staples in tact. Extremities:  LLE and foot warm and well perfused.  Exit wound bandage with serosanguinous drainage.  CBC    Component Value Date/Time   WBC 5.2 09/13/2011 0525   RBC 2.94* 09/13/2011 0525   HGB 9.2* 09/13/2011 0525   HCT 26.4* 09/13/2011 0525   PLT 248 09/13/2011 0525   MCV 89.8 09/13/2011 0525   MCH 31.3 09/13/2011 0525   MCHC 34.8 09/13/2011 0525   RDW 13.4 09/13/2011 0525    BMET    Component Value Date/Time   NA 135 09/11/2011 0056   K 3.8 09/11/2011 0056   CL 95* 09/11/2011 0056   CO2 33* 09/11/2011 0056   GLUCOSE 99 09/11/2011 0056   BUN 6 09/11/2011 0056   CREATININE 0.95 09/11/2011 0056   CALCIUM 8.6 09/11/2011 0056   GFRNONAA >90 09/11/2011 0056   GFRAA >90 09/11/2011 0056    INR    Component Value Date/Time   INR 1.22 09/13/2011 0525     Intake/Output Summary (Last 24 hours) at 09/13/11 0718 Last data filed at 09/13/11 0454  Gross per 24 hour  Intake   1440 ml  Output   1900 ml  Net   -460 ml     Assessment/Plan:  41 y.o. male is s/p Repair left above-knee popliteal artery, repair left above-knee popliteal vein, 4 compartment fasciotomy left leg and closure of fasciotomies   POD 5/3  -Hgb continues to trend down and pt's systolic is 90s-100s.  Will continue to monitor. -INR is not therapeutic but jump from 1.03 to 1.22 with 7.5 and 10 mg of coumadin respectively. -social work working with pt on disposition at discharge. -continues to have pain issues.  States it was somewhat improved with flexeril, but he only took 1 dose last night at 9:30.  Continue to wean PCA as tolerated. -check labs in am.   Doreatha Massed, PA-C [facility] [phone] 09/13/2011 7:18 AM   Palpable PT pulse.  Wounds healing. D/C PCA Continue Physical Therapy Will need 5 more weeks of coumadin Possible d/c home tomorrow  Fabienne Bruns, MD [facility] [place] Office: [phone] Pager: [phone]
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
## 2011-09-13 NOTE — Progress Notes (Signed)
PHYSICAL THERAPY PROGRESS NOTE   09/13/11 1400  PT Visit Information  Last PT Received On 09/13/11  Assistance Needed +1  PT Time Calculation  PT Start Time 1400  PT Stop Time 1432  PT Time Calculation (min) 32 min  Subjective Data  Subjective "It stings so bad."  Precautions  Precautions Fall  Restrictions  Weight Bearing Restrictions Yes  LLE Weight Bearing TWB  Cognition  Overall Cognitive Status Appears within functional limits for tasks assessed/performed  Arousal/Alertness Awake/alert  Orientation Level Appears intact for tasks assessed  Behavior During Session [facility] for tasks performed  Bed Mobility  Bed Mobility Sit to Supine  Sit to Supine 4: Min assist;HOB flat  Details for Bed Mobility Assistance Needed min assist for L LE into bed  Transfers  Transfers Sit to Stand;Stand to Sit  Sit to Stand 5: Supervision;With upper extremity assist;With armrests;From chair/3-in-1  Stand to Sit 5: Supervision;With upper extremity assist;To bed  Details for Transfer Assistance verbal cues for safety/hand placement  Ambulation/Gait  Ambulation/Gait Assistance 4: Min assist  Ambulation Distance (Feet) 15 Feet  Assistive device Rolling walker  Ambulation/Gait Assistance Details very slow cadence.  Needed physical assist at times to advance L LE.  Cues for sequence.  Gait Pattern Step-to pattern  Gait velocity decreased  Stairs No  PT - End of Session  Equipment Utilized During Treatment Gait belt  Activity Tolerance Patient limited by pain  Patient left in bed;with call bell/phone within reach  Nurse Communication Mobility status  PT - Assessment/Plan  Comments on Treatment Session Continues to be limited by pain.  Pt premedicated prior to treatment yet presents with 10/10 c/o L LE pain.  Will see pt 7/12 to attempt stair instruction to prepare for d/c if pt tolerates.  PT Plan Discharge plan remains appropriate;Frequency remains appropriate  PT Frequency Min 6X/week  Follow Up  Recommendations Home health PT;Supervision/Assistance - 24 hour  Equipment Recommended Rolling walker with 5" wheels;3 in 1 bedside comode  Acute Rehab PT Goals  Time For Goal Achievement 09/23/11  Potential to Achieve Goals Good  PT Goal: Sit to Supine/Side - Progress Progressing toward goal  PT Goal: Sit to Stand - Progress Progressing toward goal  PT Goal: Stand to Sit - Progress Progressing toward goal  PT Goal: Ambulate - Progress Progressing toward goal  PT General Charges  $$ ACUTE PT VISIT 1 Procedure  PT Treatments  $Gait Training 23-37 mins     Newell Coral, [place] [facility] [phone] (office)

## 2011-09-13 NOTE — Progress Notes (Signed)
ANTICOAGULATION CONSULT NOTE - Follow Up Consult  Pharmacy Consult for heparin Indication: arterial thrombus  Labs:  Basename 09/13/11 0525 09/12/11 0945 09/12/11 0535 09/11/11 2216 09/11/11 0810 09/11/11 0056  HGB 9.2* -- 9.6* -- -- --  HCT 26.4* -- 27.3* -- 27.4* --  PLT 248 -- 221 -- 179 --  APTT -- -- -- -- -- --  LABPROT 15.7* 13.7 -- -- -- --  INR 1.22 1.03 -- -- -- --  HEPARINUNFRC <0.10* -- 0.44 0.41 -- --  CREATININE -- -- -- -- -- 0.95  CKTOTAL -- -- -- -- -- --  CKMB -- -- -- -- -- --  TROPONINI -- -- -- -- -- --    Assessment: 41yo male now undetectable on heparin after two levels at goal and several days at least >0.1; RN says gtt has been running as ordered without interruption or issue.  Goal of Therapy:  Heparin level 0.3-0.7 units/ml   Plan:  Will increase gtt by 3-4 units/kg/hr to 1700 units/hr and check level in 6hr.  Colleen Can PharmD BCPS 09/13/2011,6:52 AM
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
## 2011-09-13 NOTE — Progress Notes (Signed)
ANTICOAGULATION CONSULT NOTE - Follow Up Consult  Pharmacy Consult for heparin Indication: arterial thrombus  Labs:  Basename 09/13/11 2122 09/13/11 1434 09/13/11 0525 09/12/11 0945 09/12/11 0535 09/11/11 0810 09/11/11 0056  HGB -- -- 9.2* -- 9.6* -- --  HCT -- -- 26.4* -- 27.3* 27.4* --  PLT -- -- 248 -- 221 179 --  APTT -- -- -- -- -- -- --  LABPROT -- -- 15.7* 13.7 -- -- --  INR -- -- 1.22 1.03 -- -- --  HEPARINUNFRC 0.43 0.20* <0.10* -- -- -- --  CREATININE -- -- -- -- -- -- 0.95  CKTOTAL -- -- -- -- -- -- --  CKMB -- -- -- -- -- -- --  TROPONINI -- -- -- -- -- -- --    Assessment: Heparin level therapeutic at current rate. No bleeding reported.   Goal of Therapy:  Heparin level 0.3 to 0.5    Plan:  Continue heparin at 1850 units/hr Follow-up AM level   Fayne Norrie PharmD BCPS 09/13/2011,9:54 PM

## 2011-09-13 NOTE — Progress Notes (Signed)
ANTICOAGULATION CONSULT NOTE - Follow Up Consult  Pharmacy Consult for heparin Indication: arterial thrombus  Labs:  Basename 09/13/11 1434 09/13/11 0525 09/12/11 0945 09/12/11 0535 09/11/11 0810 09/11/11 0056  HGB -- 9.2* -- 9.6* -- --  HCT -- 26.4* -- 27.3* 27.4* --  PLT -- 248 -- 221 179 --  APTT -- -- -- -- -- --  LABPROT -- 15.7* 13.7 -- -- --  INR -- 1.22 1.03 -- -- --  HEPARINUNFRC 0.20* <0.10* -- 0.44 -- --  CREATININE -- -- -- -- -- 0.95  CKTOTAL -- -- -- -- -- --  CKMB -- -- -- -- -- --  TROPONINI -- -- -- -- -- --    Assessment: Heparin level still subtherapeutic even though it was therapeutic for yesterday. Will adjust rate again.   Goal of Therapy:  Heparin level 0.3-0.7 units/ml   Plan:  Will increase gtt to 1850 units/hr Check 6 hr heparin level  Ulyses [place]ward [place] PharmD BCPS 09/13/2011,3:12 PM

## 2011-09-14 ENCOUNTER — Other Ambulatory Visit: Payer: Self-pay | Admitting: *Deleted

## 2011-09-14 ENCOUNTER — Telehealth: Payer: Self-pay | Admitting: Vascular Surgery

## 2011-09-14 LAB — PROTIME-INR
INR: 1.26 (ref 0.00–1.49)
Prothrombin Time: 16.1 seconds — ABNORMAL HIGH (ref 11.6–15.2)

## 2011-09-14 LAB — CBC
HCT: 28.8 % — ABNORMAL LOW (ref 39.0–52.0)
Hemoglobin: 9.7 g/dL — ABNORMAL LOW (ref 13.0–17.0)
MCH: 30.7 pg (ref 26.0–34.0)
MCHC: 33.7 g/dL (ref 30.0–36.0)
MCV: 91.1 fL (ref 78.0–100.0)
Platelets: 300 10*3/uL (ref 150–400)
RBC: 3.16 MIL/uL — ABNORMAL LOW (ref 4.22–5.81)
RDW: 13.7 % (ref 11.5–15.5)
WBC: 5.7 10*3/uL (ref 4.0–10.5)

## 2011-09-14 LAB — HEPARIN LEVEL (UNFRACTIONATED): Heparin Unfractionated: 0.31 IU/mL (ref 0.30–0.70)

## 2011-09-14 MED ORDER — ENOXAPARIN SODIUM 100 MG/ML ~~LOC~~ SOLN: 100.0000 mg | Freq: Every day | SUBCUTANEOUS | Status: DC
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
## 2011-09-14 NOTE — Progress Notes (Signed)
Patient sleeping in bed- appears sad/low today- he has not heard back from his grandfather and declines my offer to reach out to him to see if housing option with him is doable. Patient states he will go home with the friends with the dogs- RNCM aware of this tentative plan and will f/u with him to further plan. Patient declines ALF/Rest Home placement at this time. Will provide some petty cash to assist with expense of RX's at d/c. Reece Levy, MSW, [facility] Majors [phone]
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
## 2011-09-14 NOTE — Telephone Encounter (Signed)
Sent letter to patients home address notifying of appointment on 10/04/11 @ 315 with [facility]. Pt is currently still hospitalized and could not be reached by phone. dpm
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
## 2011-09-15 LAB — CBC
HCT: 31.2 % — ABNORMAL LOW (ref 39.0–52.0)
Hemoglobin: 10.4 g/dL — ABNORMAL LOW (ref 13.0–17.0)
MCH: 31 pg (ref 26.0–34.0)
MCHC: 33.3 g/dL (ref 30.0–36.0)
MCV: 92.9 fL (ref 78.0–100.0)
Platelets: 329 10*3/uL (ref 150–400)
RBC: 3.36 MIL/uL — ABNORMAL LOW (ref 4.22–5.81)
RDW: 13.7 % (ref 11.5–15.5)
WBC: 7.2 10*3/uL (ref 4.0–10.5)

## 2011-09-15 LAB — HEPARIN LEVEL (UNFRACTIONATED): Heparin Unfractionated: 0.36 IU/mL (ref 0.30–0.70)

## 2011-09-15 MED ORDER — CYCLOBENZAPRINE HCL 5 MG PO TABS: 5.0000 mg | ORAL_TABLET | Freq: Three times a day (TID) | ORAL | Status: DC | PRN

## 2011-09-15 MED ORDER — OXYCODONE HCL 10 MG PO TB12
10.0000 mg | ORAL_TABLET | Freq: Two times a day (BID) | ORAL | Status: DC
  Administered 2011-09-15 – 2011-09-16 (×3): 10 mg via ORAL
  Filled 2011-09-15 (×3): qty 1

## 2011-09-15 MED ORDER — OXYCODONE HCL 5 MG PO TABS: 5.0000 mg | ORAL_TABLET | ORAL | Status: DC | PRN

## 2011-09-15 MED ORDER — NICOTINE 21 MG/24HR TD PT24: 1.0000 | MEDICATED_PATCH | Freq: Every day | TRANSDERMAL | Status: DC
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
## 2011-09-16 LAB — HEPARIN LEVEL (UNFRACTIONATED)
Heparin Unfractionated: 1.06 IU/mL — ABNORMAL HIGH (ref 0.30–0.70)
Heparin Unfractionated: 0.89 IU/mL — ABNORMAL HIGH (ref 0.30–0.70)

## 2011-09-16 LAB — CBC
HCT: 29.7 % — ABNORMAL LOW (ref 39.0–52.0)
Hemoglobin: 9.8 g/dL — ABNORMAL LOW (ref 13.0–17.0)
MCH: 30.5 pg (ref 26.0–34.0)
MCHC: 33 g/dL (ref 30.0–36.0)
MCV: 92.5 fL (ref 78.0–100.0)
Platelets: 353 10*3/uL (ref 150–400)
RBC: 3.21 MIL/uL — ABNORMAL LOW (ref 4.22–5.81)
RDW: 13.6 % (ref 11.5–15.5)
WBC: 8 10*3/uL (ref 4.0–10.5)

## 2011-09-16 MED ORDER — ENOXAPARIN (LOVENOX) PATIENT EDUCATION KIT
PACK | Freq: Once | Status: AC
  Administered 2011-09-16: 14:00:00
  Filled 2011-09-16: qty 1

## 2011-09-16 MED ORDER — HEPARIN (PORCINE) IN NACL 100-0.45 UNIT/ML-% IJ SOLN
1800.0000 [IU]/h | INTRAMUSCULAR | Status: DC
Start: 2011-09-16 — End: 2011-09-16

## 2011-09-16 MED ORDER — OXYCODONE HCL 10 MG PO TB12: 10.0000 mg | ORAL_TABLET | Freq: Two times a day (BID) | ORAL | Status: DC

## 2011-09-16 MED ORDER — CYCLOBENZAPRINE HCL 5 MG PO TABS: 5.0000 mg | ORAL_TABLET | Freq: Three times a day (TID) | ORAL | Status: AC | PRN

## 2011-09-16 MED ORDER — NICOTINE 21 MG/24HR TD PT24: 1.0000 | MEDICATED_PATCH | Freq: Every day | TRANSDERMAL | Status: AC

## 2011-09-16 MED ORDER — HEPARIN (PORCINE) IN NACL 100-0.45 UNIT/ML-% IJ SOLN: 1800.0000 [IU]/h | INTRAMUSCULAR | Status: DC
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
## 2011-09-16 NOTE — Discharge Planning (Signed)
Spoke with patient and family reference discharge, medications prescribed, HH set up, and DME to be delivered to his home. Patient and family have [facility]'s number and will call them when they get home. Ordered Lovenox teaching kit, gave to patient, and reinforced teaching. Patient now aware how to inject the correct dose from a 100 mg syringe, leaving air bubble intact. Explained to rotate sites and to not rub after injections. Patient verbalized understanding by repeating instructions. Patient received seven 100mg  injections (90mg  prescribed daily) from the [facility] Lovenox fund. Flexeril is a [employer] Rx, Oxycodone can be generic, and Nicoderm Patch is OTC. Patient also given petty cash from CSW. Patient and family very appreciative of assistance. Roberto Knapp

## 2011-09-16 NOTE — Progress Notes (Signed)
CSW contacted by RN for med assist. CSW provided pt with petty cash to assist with Rx cost. \ CSW signing off as no other CSW needs identified.  Dellie Burns, MSW, [place] [phone] (weekend)

## 2011-09-16 NOTE — Progress Notes (Signed)
PT Cancellation Note  Treatment cancelled today due to patient's refusal to participate x2.  Patient states he doesn't want to start hurting too badly before going home.  Vena Austria 09/16/2011, 3:13 PM [phone]
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
## 2011-09-19 ENCOUNTER — Encounter (HOSPITAL_COMMUNITY): Payer: Self-pay | Admitting: *Deleted

## 2011-09-24 ENCOUNTER — Telehealth: Payer: Self-pay
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
## 2011-09-25 ENCOUNTER — Encounter: Payer: Self-pay | Admitting: Neurosurgery

## 2011-09-25 ENCOUNTER — Ambulatory Visit (INDEPENDENT_AMBULATORY_CARE_PROVIDER_SITE_OTHER): Payer: Self-pay | Admitting: Neurosurgery

## 2011-09-25 VITALS — BP 102/72 | HR 102 | Temp 98.4°F | Resp 16 | Ht 68.5 in | Wt 126.3 lb

## 2011-09-25 DIAGNOSIS — T148XXA Other injury of unspecified body region, initial encounter: Secondary | ICD-10-CM

## 2011-09-25 DIAGNOSIS — Y249XXA Unspecified firearm discharge, undetermined intent, initial encounter: Secondary | ICD-10-CM | POA: Insufficient documentation

## 2011-09-25 DIAGNOSIS — W3400XA Accidental discharge from unspecified firearms or gun, initial encounter: Secondary | ICD-10-CM

## 2011-09-25 MED ORDER — HYDROCODONE-ACETAMINOPHEN 5-500 MG PO TABS: 1.0000 | ORAL_TABLET | Freq: Four times a day (QID) | ORAL | Status: AC | PRN

## 2011-09-25 MED ORDER — CEPHALEXIN 500 MG PO CAPS: 500.0000 mg | ORAL_CAPSULE | Freq: Four times a day (QID) | ORAL | Status: DC

## 2011-09-25 MED ORDER — CEPHALEXIN 500 MG PO CAPS: 500.0000 mg | ORAL_CAPSULE | Freq: Four times a day (QID) | ORAL | Status: AC
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
## 2011-10-03 ENCOUNTER — Encounter: Payer: Self-pay | Admitting: Vascular Surgery

## 2011-10-04 ENCOUNTER — Ambulatory Visit (INDEPENDENT_AMBULATORY_CARE_PROVIDER_SITE_OTHER): Payer: Self-pay | Admitting: Vascular Surgery

## 2011-10-04 ENCOUNTER — Encounter: Payer: Self-pay | Admitting: Vascular Surgery

## 2011-10-04 ENCOUNTER — Ambulatory Visit: Payer: Self-pay | Admitting: Vascular Surgery

## 2011-10-04 VITALS — BP 103/70 | HR 82 | Temp 97.5°F | Ht 68.5 in | Wt 124.0 lb

## 2011-10-04 DIAGNOSIS — S85909A Unspecified injury of unspecified blood vessel at lower leg level, unspecified leg, initial encounter: Secondary | ICD-10-CM

## 2011-10-04 DIAGNOSIS — Y249XXA Unspecified firearm discharge, undetermined intent, initial encounter: Secondary | ICD-10-CM

## 2011-10-04 DIAGNOSIS — W3400XA Accidental discharge from unspecified firearms or gun, initial encounter: Secondary | ICD-10-CM

## 2011-10-04 DIAGNOSIS — T148XXA Other injury of unspecified body region, initial encounter: Secondary | ICD-10-CM

## 2011-10-04 DIAGNOSIS — Z48812 Encounter for surgical aftercare following surgery on the circulatory system: Secondary | ICD-10-CM

## 2011-10-04 NOTE — Addendum Note (Signed)
Addended by: Sharee Pimple on: 10/04/2011 01:32 PM   Modules accepted: Orders
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
## 2011-12-12 ENCOUNTER — Encounter: Payer: Self-pay | Admitting: Neurosurgery

## 2011-12-13 ENCOUNTER — Ambulatory Visit: Payer: Self-pay | Admitting: Neurosurgery

## 2013-05-01 ENCOUNTER — Emergency Department (HOSPITAL_COMMUNITY)
Admission: EM | Admit: 2013-05-01 | Discharge: 2013-05-01 | Disposition: A | Discharge status: Home / Self Care | Payer: Self-pay | Attending: Emergency Medicine | Admitting: Emergency Medicine

## 2013-05-01 ENCOUNTER — Encounter (HOSPITAL_COMMUNITY): Payer: Self-pay | Admitting: Emergency Medicine

## 2013-05-01 DIAGNOSIS — R21 Rash and other nonspecific skin eruption: Secondary | ICD-10-CM | POA: Insufficient documentation

## 2013-05-01 DIAGNOSIS — F17200 Nicotine dependence, unspecified, uncomplicated: Secondary | ICD-10-CM | POA: Insufficient documentation

## 2013-05-01 DIAGNOSIS — Y9389 Activity, other specified: Secondary | ICD-10-CM | POA: Insufficient documentation

## 2013-05-01 DIAGNOSIS — Y929 Unspecified place or not applicable: Secondary | ICD-10-CM | POA: Insufficient documentation

## 2013-05-01 DIAGNOSIS — IMO0002 Reserved for concepts with insufficient information to code with codable children: Secondary | ICD-10-CM | POA: Insufficient documentation

## 2013-05-01 DIAGNOSIS — W268XXA Contact with other sharp object(s), not elsewhere classified, initial encounter: Secondary | ICD-10-CM | POA: Insufficient documentation

## 2013-05-01 DIAGNOSIS — S30812A Abrasion of penis, initial encounter: Secondary | ICD-10-CM

## 2013-05-01 DIAGNOSIS — Z79899 Other long term (current) drug therapy: Secondary | ICD-10-CM | POA: Insufficient documentation

## 2013-05-01 LAB — URINALYSIS, ROUTINE W REFLEX MICROSCOPIC
Bilirubin Urine: NEGATIVE
Glucose, UA: NEGATIVE mg/dL
Hgb urine dipstick: NEGATIVE
Ketones, ur: NEGATIVE mg/dL
Leukocytes, UA: NEGATIVE
Nitrite: NEGATIVE
Protein, ur: NEGATIVE mg/dL
Specific Gravity, Urine: 1.012 (ref 1.005–1.030)
Urobilinogen, UA: 1 mg/dL (ref 0.0–1.0)
pH: 6 (ref 5.0–8.0)

## 2013-05-01 LAB — RPR: RPR Ser Ql: NONREACTIVE

## 2013-05-01 LAB — HIV ANTIBODY (ROUTINE TESTING W REFLEX): HIV: NONREACTIVE

## 2013-05-01 NOTE — Progress Notes (Signed)
[facility] Stacy, provided pt with a list of primary care resources to help patient establish primary care.

## 2013-05-01 NOTE — ED Provider Notes (Signed)
CSN: [id number]     Arrival date & time 05/01/13  1309 History  This chart was scribed for non-physician practitioner, Coral Ceo, PA-C working with Layla Maw Ward, DO by Greggory Stallion, ED scribe. This patient was seen in room WTR7/WTR7 and the patient's care was started at 1:48 PM.    Chief Complaint  Patient presents with  . SEXUALLY TRANSMITTED DISEASE   The history is provided by the patient. No language interpreter was used.   HPI Comments: Roberto Knapp is a 43 y.o. male who presents to the Emergency Department complaining of a resolving rash to the tip of his penis that started about two weeks ago. Denies draining from the area. He states he was trying to trim his pubic hair with a clippers when he grazed the side of his penis with the clippers and had a residual wound, which has improved since onset. Patient concerned the wound has not resolved.  Pt denies any new or recent sexual contacts. Denies fever, mouth sores, difficulty urinating, dysuria, penile pain, testicular pain, penile discharge.    Past Medical History  Diagnosis Date  . Drug overdose, intentional    Past Surgical History  Procedure Laterality Date  . No past surgeries    . Femoral-popliteal bypass graft  09/08/2011    Procedure: BYPASS GRAFT FEMORAL-POPLITEAL ARTERY;  Surgeon: Sherren Kerns, MD;  Location: [facility] OR;  Service: Vascular;  Laterality: Left;  . Fasciotomy  09/08/2011    Procedure: FASCIOTOMY;  Surgeon: Sherren Kerns, MD;  Location: [facility] OR;  Service: Vascular;  Laterality: Left;   No family history on file. History  Substance Use Topics  . Smoking status: Current Every Day Smoker -- 0.50 packs/day for 25 years    Types: Cigarettes  . Smokeless tobacco: Never Used     Comment: pt states that he is trying to quit and has cut back  . Alcohol Use: Yes     Comment: pt states that he drinks liquor and beer but has not in a while and does not know what he averages a week because it has been so  inconsistant    Review of Systems  Constitutional: Negative for fever.  HENT: Negative for mouth sores.   Genitourinary: Negative for dysuria, discharge, difficulty urinating, penile pain and testicular pain.  Skin: Positive for rash.  All other systems reviewed and are negative.   Allergies  Review of patient's allergies indicates no known allergies.  Home Medications   Current Outpatient Rx  Name  Route  Sig  Dispense  Refill  . Cyanocobalamin (VITAMIN B-12 PO)   Oral   Take 1 tablet by mouth daily.         [name] Kitchen enoxaparin (LOVENOX) 100 MG/ML injection   Subcutaneous   Inject 1 mL (100 mg total) into the skin daily. Please inject 90 mg SQ daily   7 Syringe   0   . GABAPENTIN PO   Oral   Take 1 tablet by mouth 3 (three) times daily.         . naproxen sodium (ANAPROX) 220 MG tablet   Oral   Take 220 mg by mouth as needed. For pain.         [name] Kitchen oxyCODONE (OXYCONTIN) 10 MG 12 hr tablet   Oral   Take 1 tablet (10 mg total) by mouth every 12 (twelve) hours.   30 tablet   0    BP 132/85  Pulse 74  Temp(Src) 98.5 F (36.9 C) (  Oral)  Resp 18  SpO2 99%  Filed Vitals:   05/01/13 1320  BP: 132/85  Pulse: 74  Temp: 98.5 F (36.9 C)  TempSrc: Oral  Resp: 18  SpO2: 99%    Physical Exam  Nursing note and vitals reviewed. Constitutional: He is oriented to person, place, and time. He appears well-developed and well-nourished. No distress.  HENT:  Head: Normocephalic and atraumatic.  Mouth/Throat: Oropharynx is clear and moist.  No oral sores throughout  Eyes: Conjunctivae and EOM are normal. Right eye exhibits no discharge. Left eye exhibits no discharge.  Neck: Neck supple. No tracheal deviation present.  Cardiovascular: Normal rate, regular rhythm and normal heart sounds.  Exam reveals no gallop and no friction rub.   No murmur heard. Pulmonary/Chest: Effort normal and breath sounds normal. No respiratory distress. He has no wheezes. He has no rales. He  exhibits no tenderness.  Abdominal: Soft. He exhibits no distension. There is no tenderness.  Genitourinary:  1 cm x 1 cm flat abrasion to the mid-shaft of the penis on the left lateral side with no vesicular lesions/papule/ulcer, open sores, tenderness, fluctuance, or drainage/bleeding. No other genital sores. No penile discharge. No testicular tenderness bilaterally  Musculoskeletal: Normal range of motion. He exhibits no edema and no tenderness.  Neurological: He is alert and oriented to person, place, and time.  Skin: Skin is warm and dry. He is not diaphoretic.  Psychiatric: He has a normal mood and affect. His behavior is normal.    ED Course  Procedures (including critical care time)  DIAGNOSTIC STUDIES: Oxygen Saturation is 99% on RA, normal by my interpretation.    COORDINATION OF CARE: 1:51 PM-Discussed treatment plan which includes STD screening with pt at bedside and pt agreed to plan.   Labs Review Labs Reviewed - No data to display Imaging Review No results found.  EKG Interpretation  None  Urinalysis    Component Value Date/Time   COLORURINE YELLOW 05/01/2013 1421   APPEARANCEUR CLEAR 05/01/2013 1421   LABSPEC 1.012 05/01/2013 1421   PHURINE 6.0 05/01/2013 1421   GLUCOSEU NEGATIVE 05/01/2013 1421   HGBUR NEGATIVE 05/01/2013 1421   BILIRUBINUR NEGATIVE 05/01/2013 1421   KETONESUR NEGATIVE 05/01/2013 1421   PROTEINUR NEGATIVE 05/01/2013 1421   UROBILINOGEN 1.0 05/01/2013 1421   NITRITE NEGATIVE 05/01/2013 1421   LEUKOCYTESUR NEGATIVE 05/01/2013 1421    MDM   Roberto Knapp is a 43 y.o. male who presents to the Emergency Department complaining of a resolving rash to the tip of his penis that started about two weeks ago. Patient likely has an abrasion from clippers which appears to be healing. No obvious signs of infection. Patient afebrile and non-toxic in appearance. No testicular tenderness. No vesicular lesions to suggest herpes. No ulcerated lesion/chancre to suggest  syphilis/chancroid. Patient not sexually active. Has no concerns for STD. Patient very anxious about possibility of STD and will be tested in the ED. Gonorrhea/chlamydia swab performed at bedside with ED tech present. Will not treat for STD's at this time. Await results of testing. UA negative for UTI. Return precautions, discharge instructions, and follow-up was discussed with the patient before discharge.  Instructed to follow-up with PCP for further management.    Discharge Medication List as of 05/01/2013  2:54 PM      Final impressions: 1. Penile abrasion      Thomasenia Sales   I personally performed the services described in this documentation, which was scribed in my presence. The recorded information  has been reviewed and is accurate.       Jillyn LedgerJessica K Juliyah Mergen, PA-C 05/02/13 1146

## 2013-05-01 NOTE — ED Notes (Addendum)
Pt reports having a "small, flat, rash-like area at the end of my penis."Pt reports the area has been there for about one week. Pt denies pain or discharge. Pt denies any new, recent sexual contacts. Pt is A/O x4, in NAD, and vitals are WDL.

## 2013-05-01 NOTE — Discharge Instructions (Signed)
- Await results of STD testing (call in a few days for results)  - Watch for signs of infection. If you develop drainage of pus, spreading redness/swelling, fever, difficulty with urination, penile discharge, severe pain, or other sores please return to the ED.     Emergency Department Resource Guide 1) Find a Doctor and Pay Out of Pocket Although you won't have to find out who is covered by your insurance plan, it is a good idea to ask around and get recommendations. You will then need to call the office and see if the doctor you have chosen will accept you as a new patient and what types of options they offer for patients who are self-pay. Some doctors offer discounts or will set up payment plans for their patients who do not have insurance, but you will need to ask so you aren't surprised when you get to your appointment.  2) Contact Your Local Health Department Not all health departments have doctors that can see patients for sick visits, but many do, so it is worth a call to see if yours does. If you don't know where your local health department is, you can check in your phone book. The CDC also has a tool to help you locate your state's health department, and many state websites also have listings of all of their local health departments.  3) Find a Walk-in Clinic If your illness is not likely to be very severe or complicated, you may want to try a walk in clinic. These are popping up all over the country in pharmacies, drugstores, and shopping centers. They're usually staffed by nurse practitioners or physician assistants that have been trained to treat common illnesses and complaints. They're usually fairly quick and inexpensive. However, if you have serious medical issues or chronic medical problems, these are probably not your best option.  No Primary Care Doctor: - Call Health Connect at  [phone] - they can help you locate a primary care doctor that  accepts your insurance, provides  certain services, etc. - Physician Referral Service- [phone]  Chronic Pain Problems: Organization         Address  Phone   Notes  [facility]  [phone]) [phone] Patients need to be referred by their primary care doctor.   Medication Assistance: Organization         Address  Phone   Notes  [facility] [address] [place], [place] [address] [phone] --Must be a resident of [place] -- Must have NO insurance coverage whatsoever (no Medicaid/ Medicare, etc.) -- The pt. MUST have a primary care doctor that directs their care regularly and follows them in the community   MedAssist  [phone]) [phone]   [employer]  [phone]    Agencies that provide inexpensive medical care: Organization         Address  Phone   Notes  [facility]  [phone]) [phone]   [facility]    [phone]   [facility] [address] [place], [place] [address] [phone]) [phone]   [facility] [address], [place] [phone]) [phone]   Planned Parenthood    [phone]) [phone]   [facility]    [phone](336) [phone]   [facility]  [address], [place] Phone:  [phone], Fax:  [phone] Hours of Operation:  9 am - 6 pm,  M-F.  Also accepts Medicaid/Medicare and self-pay.  [facility]  [address], [place] Phone: [phone], Fax: [phone]. Hours of Operation:  8:30 am - 5:30 pm, M-F.  Also accepts Medicaid and self-pay.  [facility] [place] [address], [place] Phone: [phone]) [phone]   [facility] [address], [place], [place] [phone], Ext. [phone] Mondays & Thursdays: 7-9 AM.  First 15 patients are seen on a first come, first serve basis.    Medicaid-accepting [facility] Providers:  Organization         Address  Phone   Notes  [facility] [address], [place] [phone]) [phone] Also accepts self-pay patients.  [facility] [address], [place]  [phone]) [phone]   [facility] [address], [place] [phone]) [phone]   [facility] [address] [place] [address], [place] [phone]) [phone]   Renaye RakersVeita Bland [address], [place]   [phone] Only accepts [place] Access [place] patients after they have their name applied to their card.   Self-Pay (no insurance) in [facility] [place]:  Organization         Address  Phone   Notes  Sickle Cell Patients, [facility] [address], [place] [phone]) [phone]   [facility] [address], [place] [phone]) [phone]   [facility] [place]  [address], [place] [phone]) [phone]   [facility]/Dr. Osei-Bonsu  [address] [place] [address], [place] or [id number], [place] [phone] Phone number for both [place] and [place] locations is the same.  [facility] [address], [place] [phone]) [phone]   [facility] [address] [place] [address], [place] or [address] [phone](336) [phone]   [facility] [address], [place] [phone](336) [phone], phone; [phone]) [phone], fax Sees patients 1st and 3rd Saturday of every month.  Must not qualify for public or private insurance (i.e. Medicaid, Medicare, [place] Health Choice, Veterans' Benefits)  Household income should be no more than 200% of the poverty level The clinic cannot treat you if you are pregnant or think you are pregnant  Sexually transmitted diseases are not treated at the clinic.    Dental Care: Organization         Address  Phone  Notes  [facility] [address], [place] [phone]  Accepts children up to age 45 who are enrolled in [place]icaid or [place] Health Choice; pregnant women with a Medicaid card; and children who have applied for Medicaid or [place] Health Choice, but were declined, whose parents can pay a reduced fee at time of service.  [facility] of [facility] [place]  [address], [place] [phone]) [phone] Accepts children up to age 45 who are enrolled in [place]icaid or [place] Health Choice; pregnant women with a Medicaid card; and children who have applied for Medicaid or [place] Health Choice, but were declined, whose parents can pay a reduced fee at time of service.  [facility]  [address], [place] [phone]) [phone] Patients are seen by appointment only. Walk-ins are not accepted. [facility] will see patients 45 years of age and older. Monday -  Tuesday (8am-5pm) Most Wednesdays (8:30-5pm) $30 per visit, cash only  [facility]  [address], [facility] [phone]) [phone] Patients are seen by appointment only. Walk-ins are not accepted. [facility] will see patients 45 years of age and older. One Wednesday Evening (Monthly: Volunteer Based).  $30 per visit, cash only  [employer] [facility] [facility]  [phone] for adults; Children under age 954, call Graduate Pediatric Dentistry at [phone]) [phone]. Children aged 274-14, please call [phone] to request a pediatric application.  Dental services are provided in all areas of dental care including fillings, crowns and bridges, complete and partial dentures, implants, gum treatment, root canals, and extractions. Preventive care is also provided. Treatment is provided to both adults and children. Patients are selected via a lottery and there is often a waiting list.   [facility] [address], [place]  [phone]) [phone] [email or web address]   [facility] [address], [place], [place] [phone], Ext. [phone] Second  and Fourth Thursday of each month, opens at 6:30 AM; Clinic ends at 9 AM.  Patients are seen on a first-come first-served basis, and a limited number are seen during each clinic.   [facility]  [address], [place], [place] [phone]) [phone]   Eligibility Requirements You must have lived in [place], [place], or [place] counties for at least the last three months.   You cannot be eligible for state or federal sponsored National Cityhealthcare insurance, including CIGNAVeterans Administration, [place]icaid, or Har[employer] EntertainmentMedicare.   You generally cannot be eligible for healthcare insurance through your employer.    How to apply: Eligibility screenings are held every Tuesday and Wednesday afternoon from 1:00 pm until 4:00 pm. You do not need an appointment for the interview!  [facility] [address], [place], [place] [phone]   [facility]  [phone]   [facility]  [phone]   [facility]  [phone]    Behavioral Health Resources in the Community: Intensive Outpatient Programs Organization         Address  Phone  Notes  [facility] [address], [place], [place] [phone]   [facility] [address], [place], [place] [phone]   [facility] [address], [place], [place]  [phone]   [facility] [address],  [place], [place] [phone] or [phone]   Substance Abuse Resources Organization         Address  Phone  Notes  [facility] Drug [facility]  [phone]   [facility]  [phone]   [facility]  [phone]   [facility]  [phone]   Residential & Outpatient Substance Abuse Program  [phone]   Psychological Services Organization         Address  Phone  Notes  [facility]  [phone]- [phone]   [facility]  [phone]- [phone]   [facility] [address], [place] [phone] or [phone]    Mobile Crisis Teams Organization         Address  Phone  Notes  [facility], Mobile Crisis Care Unit  [phone]   [facility]  [address] [place], [place] [phone]   Doristine LocksSharon DeEsch [address] [place] [phone]    Self-Help/Support Groups Organization         Address  Phone  Notes  [facility] of [place] - variety of support groups  [place] Call for more information  Narcotics Anonymous (NA), Caring Services [address], [employer] [place]  2 meetings at this location   Special educational needs teacher         Address  Phone  Notes  [facility] Residential Treatment [place],    [place]  [phone]   [facility]  [address], [place] [address]5790, [place], [place]   [place], [place] [phone] Admissions: 8am-3pm M-F  Incentives Substance [place] [address],    [place], [place] [phone]   [facility] [address] [place], [place], [place]   [facility] [address]  [place], [place]   [facility] - Intensive Outpatient [address], [place], [place]   [facility] ([place]  [place], [place] [phone] or [phone]   [facility] (RTS) [address] [place], [place] Accepts Medicaid  [facility] [address],  [place] [phone] Substance Abuse/Addiction Treatment   [facility] Organization         Address  Phone  Notes  [facility]  [phone]   Domenic Schwab, PhD [address] [place], [place]   [phone] or [phone]   [place], [place]  [phone]   [facility] [address], [place], [place] [phone] Insurance/Medicaid/sponsorship through [facility] and Families [address]                                    [place], [place] [phone] [place] [address] [place], [place] [phone]    Dr. Adele Schilder  [phone]   [facility] Dept. 1) [address], [place] 2) [place] 3)  [place] [address], [place] [phone]  [phone]   [place] [phone] or [phone] (After Hours)

## 2013-05-02 LAB — GC/CHLAMYDIA PROBE AMP
CT Probe RNA: NEGATIVE
GC Probe RNA: NEGATIVE

## 2013-05-04 NOTE — ED Provider Notes (Signed)
Medical screening examination/treatment/procedure(s) were performed by non-physician practitioner and as supervising physician I was immediately available for consultation/collaboration.   EKG Interpretation None        Layla MawKristen N Vienne Corcoran, DO 05/04/13 1259

## 2014-10-21 ENCOUNTER — Emergency Department (HOSPITAL_COMMUNITY)
Admission: EM | Admit: 2014-10-21 | Discharge: 2014-10-21 | Disposition: A | Discharge status: Home / Self Care | Payer: 59 | Attending: Emergency Medicine | Admitting: Emergency Medicine

## 2014-10-21 ENCOUNTER — Emergency Department (HOSPITAL_COMMUNITY): Payer: 59

## 2014-10-21 ENCOUNTER — Encounter (HOSPITAL_COMMUNITY): Payer: Self-pay | Admitting: Emergency Medicine

## 2014-10-21 DIAGNOSIS — R1032 Left lower quadrant pain: Secondary | ICD-10-CM

## 2014-10-21 DIAGNOSIS — R062 Wheezing: Secondary | ICD-10-CM | POA: Diagnosis not present

## 2014-10-21 DIAGNOSIS — Z202 Contact with and (suspected) exposure to infections with a predominantly sexual mode of transmission: Secondary | ICD-10-CM | POA: Insufficient documentation

## 2014-10-21 DIAGNOSIS — Z720 Tobacco use: Secondary | ICD-10-CM | POA: Insufficient documentation

## 2014-10-21 DIAGNOSIS — Z711 Person with feared health complaint in whom no diagnosis is made: Secondary | ICD-10-CM

## 2014-10-21 DIAGNOSIS — Z79899 Other long term (current) drug therapy: Secondary | ICD-10-CM | POA: Diagnosis not present

## 2014-10-21 DIAGNOSIS — N390 Urinary tract infection, site not specified: Secondary | ICD-10-CM | POA: Diagnosis not present

## 2014-10-21 LAB — COMPREHENSIVE METABOLIC PANEL
ALT: 15 U/L — ABNORMAL LOW (ref 17–63)
AST: 24 U/L (ref 15–41)
Albumin: 4.3 g/dL (ref 3.5–5.0)
Alkaline Phosphatase: 62 U/L (ref 38–126)
Anion gap: 12 (ref 5–15)
BUN: 15 mg/dL (ref 6–20)
CO2: 23 mmol/L (ref 22–32)
Calcium: 9.1 mg/dL (ref 8.9–10.3)
Chloride: 104 mmol/L (ref 101–111)
Creatinine, Ser: 1.2 mg/dL (ref 0.61–1.24)
GFR calc Af Amer: >60 mL/min (ref >60)
GFR calc non Af Amer: >60 mL/min (ref >60)
Glucose, Bld: 110 mg/dL — ABNORMAL HIGH (ref 65–99)
Potassium: 3.6 mmol/L (ref 3.5–5.1)
Sodium: 139 mmol/L (ref 135–145)
Total Bilirubin: 0.7 mg/dL (ref 0.3–1.2)
Total Protein: 7.2 g/dL (ref 6.5–8.1)

## 2014-10-21 LAB — URINALYSIS, ROUTINE W REFLEX MICROSCOPIC
Glucose, UA: 100 mg/dL — AB
Ketones, ur: 40 mg/dL — AB
Nitrite: NEGATIVE
Protein, ur: 30 mg/dL — AB
Specific Gravity, Urine: 1.029 (ref 1.005–1.030)
Urobilinogen, UA: 1 mg/dL (ref 0.0–1.0)
pH: 7.5 (ref 5.0–8.0)

## 2014-10-21 LAB — URINE MICROSCOPIC-ADD ON

## 2014-10-21 LAB — CBC
HCT: 48.1 % (ref 39.0–52.0)
Hemoglobin: 16.2 g/dL (ref 13.0–17.0)
MCH: 32.1 pg (ref 26.0–34.0)
MCHC: 33.7 g/dL (ref 30.0–36.0)
MCV: 95.2 fL (ref 78.0–100.0)
Platelets: 276 10*3/uL (ref 150–400)
RBC: 5.05 MIL/uL (ref 4.22–5.81)
RDW: 13.2 % (ref 11.5–15.5)
WBC: 16.2 10*3/uL — ABNORMAL HIGH (ref 4.0–10.5)

## 2014-10-21 LAB — LIPASE, BLOOD: Lipase: 20 U/L — ABNORMAL LOW (ref 22–51)

## 2014-10-21 LAB — ETHANOL: Alcohol, Ethyl (B): <5 mg/dL (ref <5)

## 2014-10-21 IMAGING — CT CT ABD-PELV W/ CM
2 of 5 series · 16 of 46 positions shown, 18 images · IV contrast (OMNIPAQUE 300)
Comparison: Lower chest: The lung bases are clear.

CLINICAL DATA: Sudden onset of left lower quadrant abdominal pain
this morning.

EXAM:
CT ABDOMEN AND PELVIS WITH CONTRAST
TECHNIQUE: Multidetector CT imaging of the abdomen and pelvis was performed
using the standard protocol following bolus administration of
intravenous contrast.
CONTRAST:  1 OMNIPAQUE IOHEXOL 300 MG/ML SOLN, 100mL OMNIPAQUE
IOHEXOL 300 MG/ML SOLN

[Series 2: abd/pel with · axial · 0.75mm/px · z∈[+869,+1249]mm · 13 of 84 slices shown, 15 images]
[im 4/84  soft-tissue]
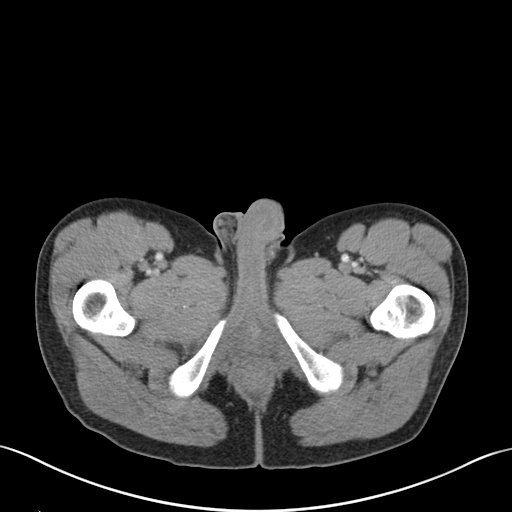
[im 4/84  bone]
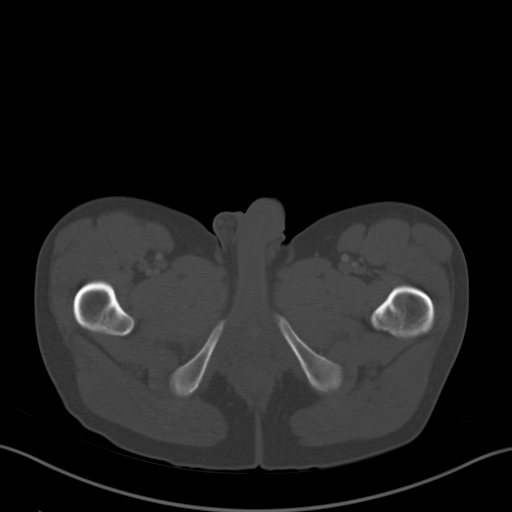
[im 12/84  soft-tissue]
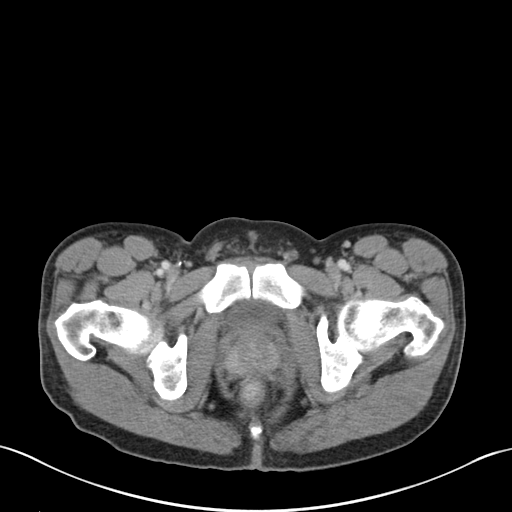
[im 16/84  soft-tissue]
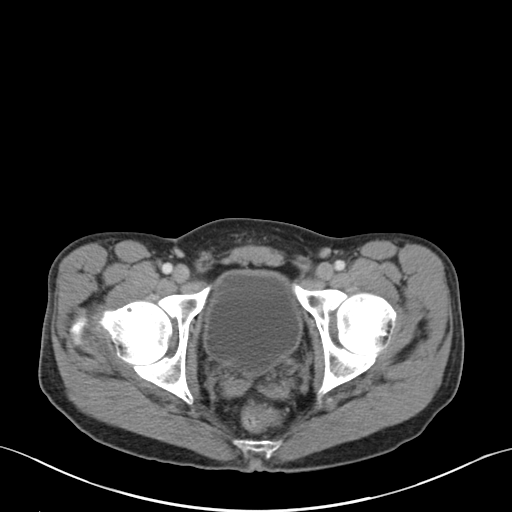
[im 24/84  soft-tissue]
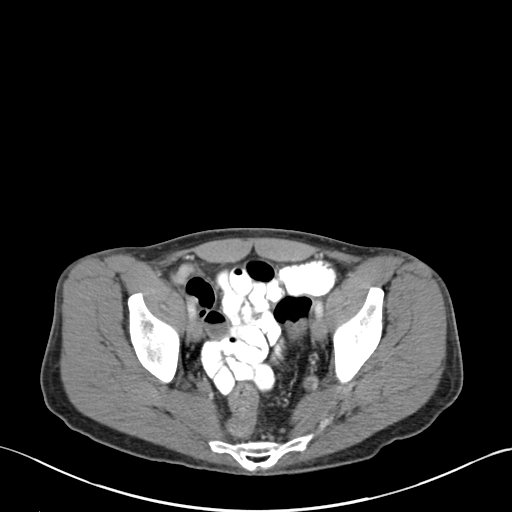
[im 28/84  soft-tissue]
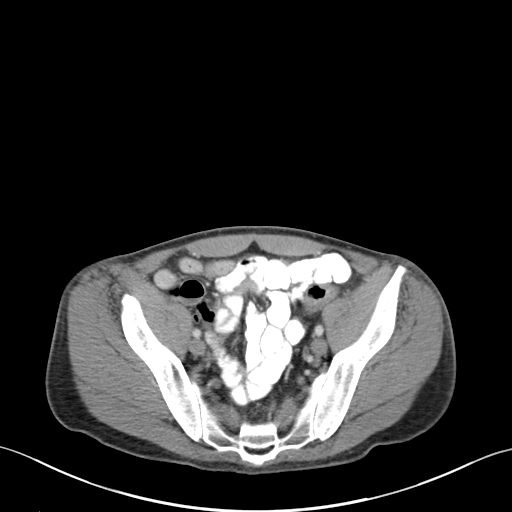
[im 36/84  soft-tissue]
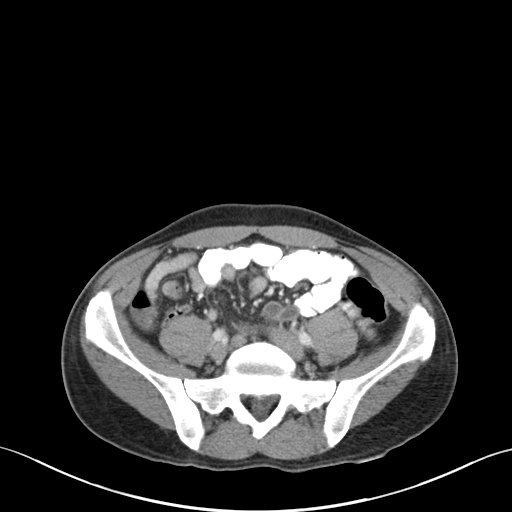
[im 44/84  soft-tissue]
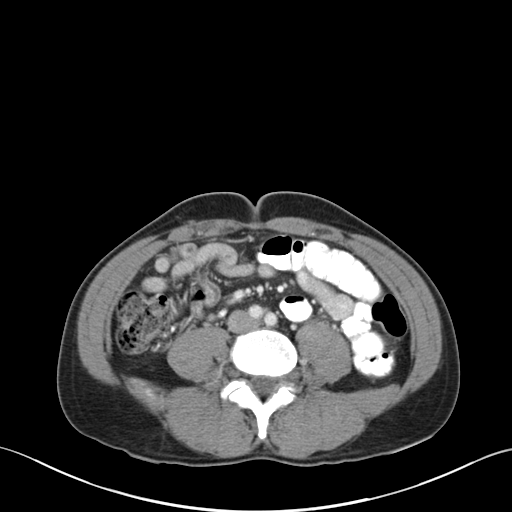
[im 48/84  soft-tissue]
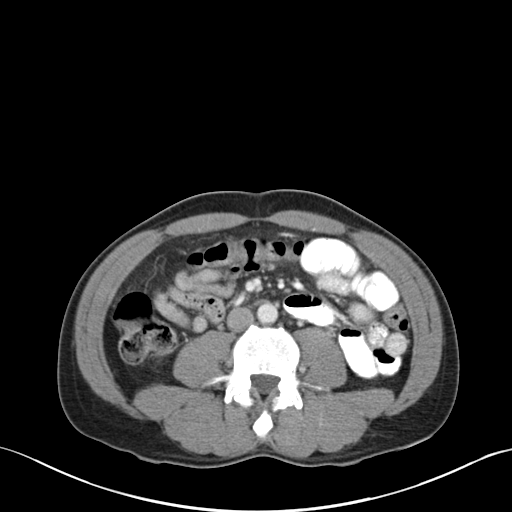
[im 56/84  soft-tissue]
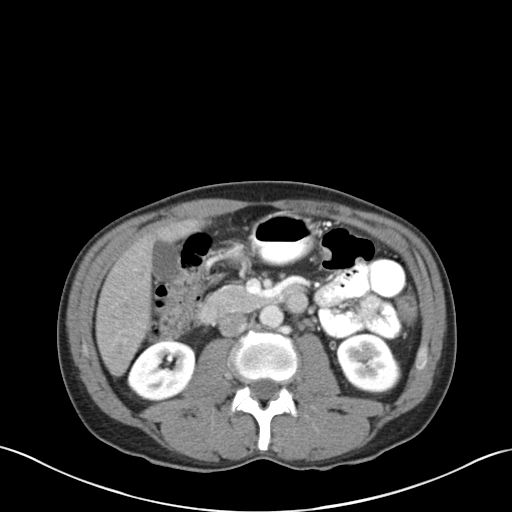
[im 56/84  bone]
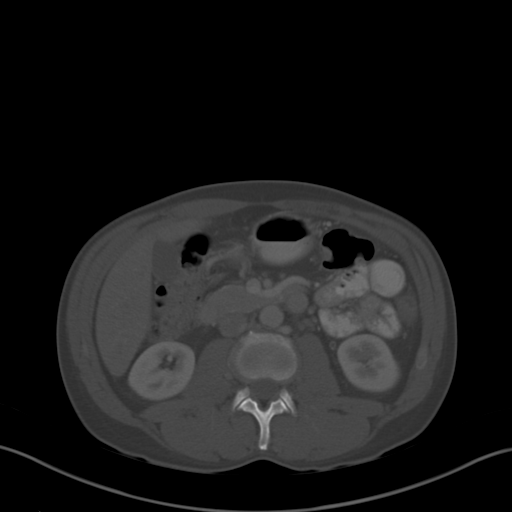
[im 60/84  soft-tissue]
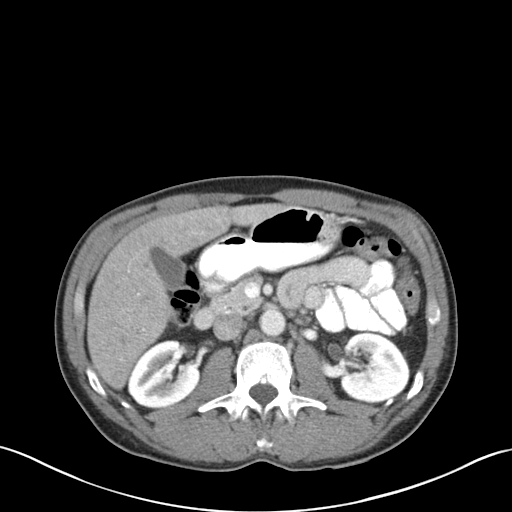
[im 68/84  soft-tissue]
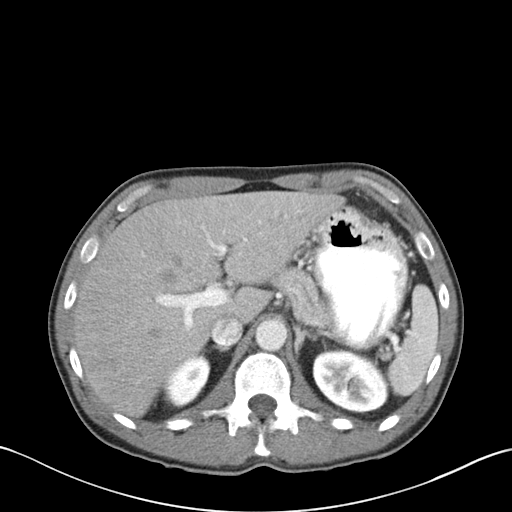
[im 72/84  soft-tissue]
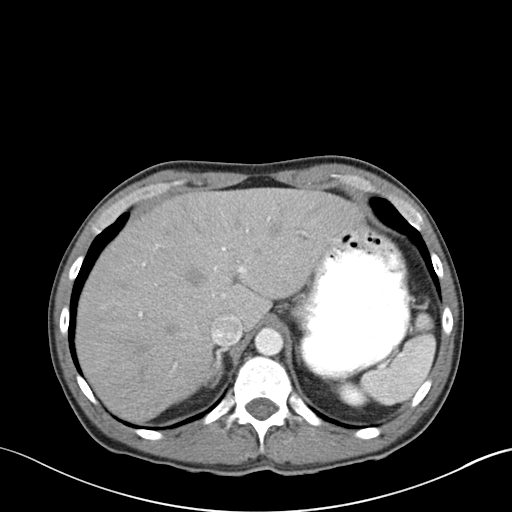
[im 80/84  soft-tissue]
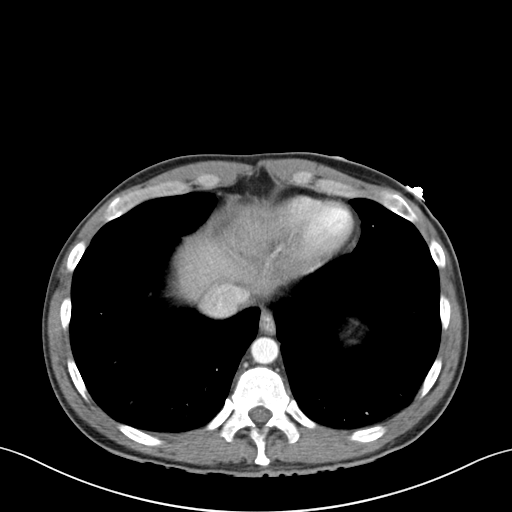

[Series 3: coronal a/|p · coronal · 0.88mm/px · 3 of 75 slices shown]
[im 25/75  soft-tissue]
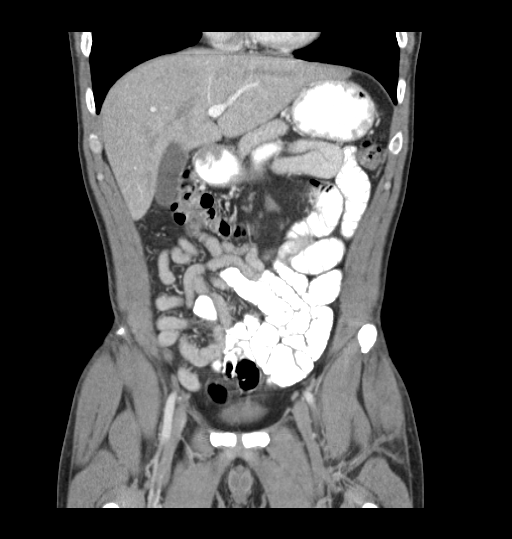
[im 33/75  soft-tissue]
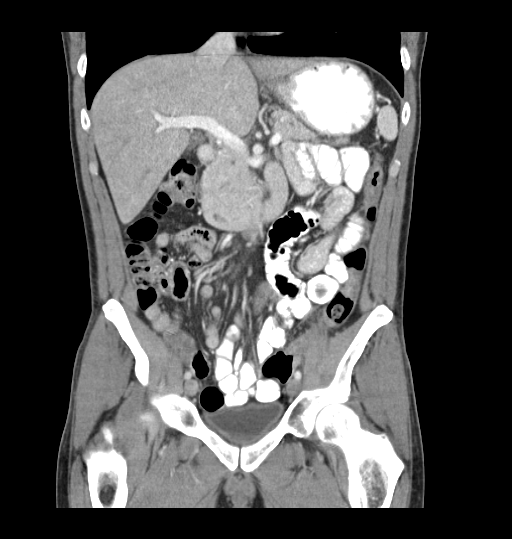
[im 42/75  soft-tissue]
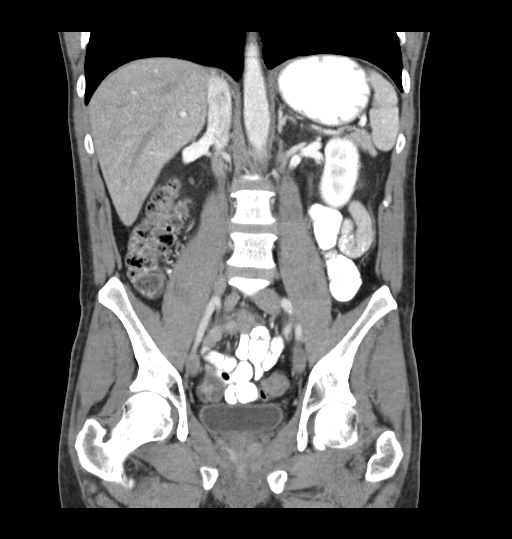

[16 of 46 positions shown; findings below may reference images not displayed]

No pleural
effusion or pulmonary lesions. The heart is normal in size. No
pericardial effusion the distal esophagus is grossly normal.

Hepatobiliary: No focal hepatic lesions or intrahepatic biliary
dilatation. The gallbladder is normal. No common bile duct
dilatation.

Pancreas: No mass, inflammation or ductal dilatation.

Spleen: Normal size.  No focal lesions.

Adrenals/Urinary Tract: The adrenal glands and kidneys are normal.
No renal or obstructing ureteral calculi or bladder calculi. No
renal or bladder mass.

Stomach/Bowel: The stomach, duodenum, small bowel and colon are
grossly normal. No inflammatory changes, mass lesions or obstructive
findings. The terminal ileum is normal. The appendix is normal.

Vascular/Lymphatic: No mesenteric or retroperitoneal mass or
adenopathy. The aorta and branch vessels are patent. The major
venous structures are patent.

Other: The bladder, prostate gland and seminal vesicles are
unremarkable. No pelvic mass, adenopathy or free pelvic fluid
collections. No inguinal mass or inguinal hernia.

Musculoskeletal: The bony structures are normal.
FINDINGS: No acute abdominal/ pelvic findings, mass lesions or adenopathy. I
do not see a cause for the patient's left lower quadrant abdominal
pain. No abdominal wall or inguinal hernia.

## 2014-10-21 MED ORDER — LIDOCAINE HCL 1 % IJ SOLN
INTRAMUSCULAR | Status: AC
  Administered 2014-10-21: 0.9 mL
  Filled 2014-10-21: qty 20

## 2014-10-21 MED ORDER — MORPHINE SULFATE (PF) 4 MG/ML IV SOLN
4.0000 mg | Freq: Once | INTRAVENOUS | Status: AC
  Administered 2014-10-21: 4 mg via INTRAVENOUS
  Filled 2014-10-21: qty 1

## 2014-10-21 MED ORDER — CEFTRIAXONE SODIUM 250 MG IJ SOLR
250.0000 mg | Freq: Once | INTRAMUSCULAR | Status: AC
  Administered 2014-10-21: 250 mg via INTRAMUSCULAR
  Filled 2014-10-21: qty 250

## 2014-10-21 MED ORDER — CEPHALEXIN 500 MG PO CAPS: 500.0000 mg | ORAL_CAPSULE | Freq: Four times a day (QID) | ORAL | Status: DC

## 2014-10-21 MED ORDER — IOHEXOL 300 MG/ML  SOLN
100.0000 mL | Freq: Once | INTRAMUSCULAR | Status: AC | PRN
  Administered 2014-10-21: 100 mL via INTRAVENOUS

## 2014-10-21 MED ORDER — AZITHROMYCIN 250 MG PO TABS
1000.0000 mg | ORAL_TABLET | Freq: Once | ORAL | Status: AC
  Administered 2014-10-21: 1000 mg via ORAL
  Filled 2014-10-21: qty 4

## 2014-10-21 MED ORDER — HYDROCODONE-ACETAMINOPHEN 5-325 MG PO TABS: 2.0000 | ORAL_TABLET | ORAL | Status: DC | PRN

## 2014-10-21 MED ORDER — IOHEXOL 300 MG/ML  SOLN
50.0000 mL | INTRAMUSCULAR | Status: DC
  Administered 2014-10-21: 50 mL via ORAL

## 2014-10-21 NOTE — Discharge Instructions (Signed)
Urinary Tract Infection A urinary tract infection (UTI) can occur any place along the urinary tract. The tract includes the kidneys, ureters, bladder, and urethra. A type of germ called bacteria often causes a UTI. UTIs are often helped with antibiotic medicine.  HOME CARE   If given, take antibiotics as told by your doctor. Finish them even if you start to feel better.  Drink enough fluids to keep your pee (urine) clear or pale yellow.  Avoid tea, drinks with caffeine, and bubbly (carbonated) drinks.  Pee often. Avoid holding your pee in for a long time.  Pee before and after having sex (intercourse).  Wipe from front to back after you poop (bowel movement) if you are a woman. Use each tissue only once. GET HELP RIGHT AWAY IF:   You have back pain.  You have lower belly (abdominal) pain.  You have chills.  You feel sick to your stomach (nauseous).  You throw up (vomit).  Your burning or discomfort with peeing does not go away.  You have a fever.  Your symptoms are not better in 3 days. MAKE SURE YOU:   Understand these instructions.  Will watch your condition.  Will get help right away if you are not doing well or get worse. Document Released: 08/08/2007 Document Revised: 11/14/2011 Document Reviewed: 09/20/2011 [facility] Patient Information 2015 [place], [place]. This information is not intended to replace advice given to you by your health care provider. Make sure you discuss any questions you have with your health care provider.  Sexually Transmitted Disease A sexually transmitted disease (STD) is a disease or infection that may be passed (transmitted) from person to person, usually during sexual activity. This may happen by way of saliva, semen, blood, vaginal mucus, or urine. Common STDs include:   Gonorrhea.   Chlamydia.   Syphilis.   HIV and AIDS.   Genital herpes.   Hepatitis B and C.   Trichomonas.   Human papillomavirus (HPV).   Pubic  lice.   Scabies.  Mites.  Bacterial vaginosis. WHAT ARE CAUSES OF STDs? An STD may be caused by bacteria, a virus, or parasites. STDs are often transmitted during sexual activity if one person is infected. However, they may also be transmitted through nonsexual means. STDs may be transmitted after:   Sexual intercourse with an infected person.   Sharing sex toys with an infected person.   Sharing needles with an infected person or using unclean piercing or tattoo needles.  Having intimate contact with the genitals, mouth, or rectal areas of an infected person.   Exposure to infected fluids during birth. WHAT ARE THE SIGNS AND SYMPTOMS OF STDs? Different STDs have different symptoms. Some people may not have any symptoms. If symptoms are present, they may include:   Painful or bloody urination.   Pain in the pelvis, abdomen, vagina, anus, throat, or eyes.   A skin rash, itching, or irritation.  Growths, ulcerations, blisters, or sores in the genital and anal areas.  Abnormal vaginal discharge with or without bad odor.   Penile discharge in men.   Fever.   Pain or bleeding during sexual intercourse.   Swollen glands in the groin area.   Yellow skin and eyes (jaundice). This is seen with hepatitis.   Swollen testicles.  Infertility.  Sores and blisters in the mouth. HOW ARE STDs DIAGNOSED? To make a diagnosis, your health care provider may:   Take a medical history.   Perform a physical exam.   Take a sample  of any discharge to examine.  Swab the throat, cervix, opening to the penis, rectum, or vagina for testing.  Test a sample of your first morning urine.   Perform blood tests.   Perform a Pap test, if this applies.   Perform a colposcopy.   Perform a laparoscopy.  HOW ARE STDs TREATED? Treatment depends on the STD. Some STDs may be treated but not cured.   Chlamydia, gonorrhea, trichomonas, and syphilis can be cured with  antibiotic medicine.   Genital herpes, hepatitis, and HIV can be treated, but not cured, with prescribed medicines. The medicines lessen symptoms.   Genital warts from HPV can be treated with medicine or by freezing, burning (electrocautery), or surgery. Warts may come back.   HPV cannot be cured with medicine or surgery. However, abnormal areas may be removed from the cervix, vagina, or vulva.   If your diagnosis is confirmed, your recent sexual partners need treatment. This is true even if they are symptom-free or have a negative culture or evaluation. They should not have sex until their health care providers say it is okay. HOW CAN I REDUCE MY RISK OF GETTING AN STD? Take these steps to reduce your risk of getting an STD:  Use latex condoms, dental dams, and water-soluble lubricants during sexual activity. Do not use petroleum jelly or oils.  Avoid having multiple sex partners.  Do not have sex with someone who has other sex partners.  Do not have sex with anyone you do not know or who is at high risk for an STD.  Avoid risky sex practices that can break your skin.  Do not have sex if you have open sores on your mouth or skin.  Avoid drinking too much alcohol or taking illegal drugs. Alcohol and drugs can affect your judgment and put you in a vulnerable position.  Avoid engaging in oral and anal sex acts.  Get vaccinated for HPV and hepatitis. If you have not received these vaccines in the past, talk to your health care provider about whether one or both might be right for you.   If you are at risk of being infected with HIV, it is recommended that you take a prescription medicine daily to prevent HIV infection. This is called pre-exposure prophylaxis (PrEP). You are considered at risk if:  You are a man who has sex with other men (MSM).  You are a heterosexual man or woman and are sexually active with more than one partner.  You take drugs by injection.  You are  sexually active with a partner who has HIV.  Talk with your health care provider about whether you are at high risk of being infected with HIV. If you choose to begin PrEP, you should first be tested for HIV. You should then be tested every 3 months for as long as you are taking PrEP.  WHAT SHOULD I DO IF I THINK I HAVE AN STD?  See your health care provider.   Tell your sexual partner(s). They should be tested and treated for any STDs.  Do not have sex until your health care provider says it is okay. WHEN SHOULD I GET IMMEDIATE MEDICAL CARE? Contact your health care provider right away if:   You have severe abdominal pain.  You are a man and notice swelling or pain in your testicles.  You are a woman and notice swelling or pain in your vagina. Document Released: 05/12/2002 Document Revised: 02/24/2013 Document Reviewed: 09/09/2012 Exit[employer] Patient Information 2015  [employer]. This information is not intended to replace advice given to you by your health care provider. Make sure you discuss any questions you have with your health care provider.    Emergency Department Resource Guide 1) Find a Doctor and Pay Out of Pocket Although you won't have to find out who is covered by your insurance plan, it is a good idea to ask around and get recommendations. You will then need to call the office and see if the doctor you have chosen will accept you as a new patient and what types of options they offer for patients who are self-pay. Some doctors offer discounts or will set up payment plans for their patients who do not have insurance, but you will need to ask so you aren't surprised when you get to your appointment.  2) Contact Your Local Health Department Not all health departments have doctors that can see patients for sick visits, but many do, so it is worth a call to see if yours does. If you don't know where your local health department is, you can check in your phone book. The CDC  also has a tool to help you locate your state's health department, and many state websites also have listings of all of their local health departments.  3) Find a Walk-in Clinic If your illness is not likely to be very severe or complicated, you may want to try a walk in clinic. These are popping up all over the country in pharmacies, drugstores, and shopping centers. They're usually staffed by nurse practitioners or physician assistants that have been trained to treat common illnesses and complaints. They're usually fairly quick and inexpensive. However, if you have serious medical issues or chronic medical problems, these are probably not your best option.  No Primary Care Doctor: - Call Health Connect at  [phone] - they can help you locate a primary care doctor that  accepts your insurance, provides certain services, etc. - Physician Referral Service- [phone]  Chronic Pain Problems: Organization         Address  Phone   Notes  [facility]  [phone] Patients need to be referred by their primary care doctor.   Medication Assistance: Organization         Address  Phone   Notes  [facility] Medication [facility] [address] [place] [place], [place] [address] [phone] --Must be a resident of [facility] -- Must have NO insurance coverage whatsoever (no Medicaid/ Medicare, etc.) -- The pt. MUST have a primary care doctor that directs their care regularly and follows them in the community   [facility]ist  [phone]   [employer]  [phone]    Agencies that provide inexpensive medical care: Organization         Address  Phone   Notes  [facility]  [phone]   [facility]    [phone]   [facility] [address] [place], [place] [address] [phone]   [facility] [address], [place] [phone]   Planned Parenthood    [phone]   [facility]    [phone]   Community Health and [facility]  [address], [place] Phone:  [phone], Fax:  [phone] Hours of Operation:  9 am - 6 pm, M-F.  Also accepts Medicaid/Medicare and self-pay.  [facility]  [address], [place] Phone: [phone], Fax: [phone]. Hours of Operation:  8:30 am - 5:30 pm, M-F.  Also accepts Medicaid and self-pay.  [facility] [place] [address], [place] Phone: [phone]   [facility] [address] Natasha Bence [place], [place] [phone], Ext. [phone] Mondays & Thursdays: 7-9 AM.  First 15 patients are seen on a first come, first serve basis.    Medicaid-accepting [facility] Providers:  Organization         Address  Phone   Notes  [facility] [address], [place] [phone] Also accepts self-pay patients.  [facility] [address] Laurell Josephs [place], [place]  [phone]   [facility] [address], [place] [phone]   [facility] [address] [place] [phone]   Renaye Rakers [address], [place]   [phone] Only accepts [place] Access [place] patients after they have their name applied to their card.   Self-Pay (no insurance) in [facility]:  Organization         Address  Phone   Notes  Sickle Cell Patients, [facility] [address] [place], [place] [phone]   [facility] [address] [place], [place] [phone]   [facility]  [address], [place] [phone]   [facility]/Dr. Osei-Bonsu  [address] [place] or [address], [place] [phone] Phone number for both [place] and [place] locations is the same.  Urgent Medical and [facility] [address] [place] [phone]   [facility] [address] [place] or [address] [phone]   [facility] [address] [place] [phone], phone; [phone], fax Sees patients 1st and 3rd Saturday of every month.  Must not qualify for public or private insurance (i.e. Medicaid, Medicare, [place] Health Choice, Veterans' Benefits)  Household income should be no more than 200% of the poverty level The clinic cannot treat you if you are pregnant or think you are pregnant  Sexually transmitted diseases are not treated at the clinic.    Dental Care: Organization         Address  Phone  Notes  [facility] Department of [facility] [address] [place], [place] [phone] Accepts children up to age 50 who are enrolled in [place] or [place] Health Choice; pregnant women with a Medicaid card; and children who have applied for Medicaid or [place] Health Choice, but were declined, whose parents can pay a reduced fee at time of service.  [facility] of [facility]  [address], [place] [phone] Accepts children up to age 66 who are enrolled in [place] or [place] Health Choice; pregnant women with a Medicaid card; and children who have applied for Medicaid or [place] Health Choice, but were declined, whose parents can pay a reduced fee at time of service.  [facility]  [address] [place], [place] [phone] Patients are seen by appointment only. Walk-ins are not accepted. [facility] will see patients 56 years of age and older. Monday - Tuesday (8am-5pm) Most Wednesdays (8:30-5pm) $30 per visit, cash only  [employer] Adult [facility]  Access PROGRAM  [address], [facility] [phone] Patients are seen by appointment only. Walk-ins are not accepted. [facility] will see patients 43 years of age and older. One  Wednesday Evening (Monthly: Volunteer Based).  $30 per visit, cash only  [employer]  [phone] for adults; Children under age 71, call Graduate Pediatric Dentistry at [phone]. Children aged 68-14, please call [phone] to request a pediatric application.  Dental services are provided in all areas of dental care including fillings, crowns and bridges, complete and partial dentures, implants, gum treatment, root canals, and extractions. Preventive care is also provided. Treatment is provided to both adults and children. Patients are selected via a lottery and there is often a waiting list.   [facility] [address] [place]  [phone] [email or web address]   [facility] [address] [place], [place] [phone], Ext. [phone] Second and Fourth Thursday of each month, opens at 6:30 AM; Clinic ends at 9 AM.  Patients are seen on a first-come first-served basis, and a limited number are seen during each clinic.   [facility]  [address] Ether Griffins [place], [place] [phone]   Eligibility Requirements You must have lived in [place], [place], or [place] counties for at least the last three months.   You cannot be eligible for state or federal sponsored [place], including [employer], [place], or [employer].   You generally cannot be eligible for healthcare insurance through your employer.    How to apply: Eligibility screenings are held every Tuesday and Wednesday afternoon from 1:00 pm until 4:00 pm. You do not need an appointment for the interview!  [facility] [address], [place], [place] [phone]   [facility] Health Department  [phone]   [facility] Health Department  [phone]   [facility] Health Department  [phone]    Behavioral Health Resources in the Community: Intensive Outpatient Programs Organization          Address  Phone  Notes  [facility] [address] [place], [place] [phone]   [facility] Outpatient [address], [place], [place] [phone]   [facility] [address] [place], [place]  [phone]   [facility] Mental Health [address],  [place], [place] [phone] or [phone]   Substance Abuse Resources Organization         Address  Phone  Notes  [facility]  [phone]   [facility]  [phone]   [facility]  [phone]   Floydene Flock  [phone]   Residential & Outpatient Substance Abuse Program  [phone]   Psychological Services Organization         Address  Phone  Notes  [facility] Behavioral Health  [phone]   [facility] Services  [phone]   [facility] Mental Health [address], [place] [phone] or [phone]    Mobile Crisis Teams Organization         Address  Phone  Notes  [facility], Mobile Crisis Care Unit  [phone]   [facility]  [address] [place], [place] [phone]   Doristine Locks [address] [place] [phone]    Self-Help/Support Groups Organization         Address  Phone             Notes  [facility]ental Health  [facility] [place] - variety of support groups  336- I7437963 Call for more information  Narcotics Anonymous (NA), [facility] [address], [employer] [place]  2 meetings at this location   Statistician         Address  Phone  Notes  [facility] [address],    [place]  [phone]   [facility]  [address], [place] [address], [place], [place] [phone]   [facility] [address] [place], [place] [phone] Admissions: 8am-3pm M-F  [facility] [address],    [place], [place] [phone]   [facility] [address] [place], [place], [place] [phone]   [facility] [address]  [place], [place] [phone]   [facility] - Intensive Outpatient [address] Laurell Josephs [address], [place], [place] [phone]   [facility] ([facility] [address] [place]  [place], [place] [phone] or [phone]   [facility] ([facility]) [address], [place], [place] [phone] Accepts Medicaid  [facility] [place] [address]  [place] [phone] Substance Abuse/Addiction Treatment   [facility] Organization         Address  Phone  Notes  [facility]  [phone]   Angie Fava, PhD [address] Ervin Knack [place], [place]   [phone] or [phone]   [facility]al   [address] [place], [place] [phone]   [facility] [address], [place], [place] [phone] Insurance/Medicaid/sponsorship through [facility] [address]                                    [place], [place] [phone] Therapy/tele-psych/case  [facility] [address], [place] [phone]    Dr. Lolly Mustache  [phone]   [facility] [place]  [employer] [facility] Dept. 1) [address], [place] 2) [address] [place] 3)  [address], [place] [phone]  [phone]   [place] Child Abuse Hotline (503)622-2483 or [phone] (After Hours)

## 2014-10-21 NOTE — ED Provider Notes (Signed)
Roberto Knapp is a 44 y.o. male  with a hx of intentional overdose presents to the Emergency Department complaining of gradual, persistent, progressively worsening LLQ abd pain onset 7am this morning. Associated symptoms include left lower back pain and nausea without vomiting.  Pt denies treatment PTA.  Nothing makes it better and nothing makes it worse.  Pt denies fever, chills, headache, neck pain, chest pain, sob, V/D, weakness, dizziness, syncope, dysuria, hematuria.  Pt denies hx of diverticulitis, abd surgery, kidney stones.   Review of Systems  Constitutional: Negative for fever, diaphoresis, appetite change, fatigue and unexpected weight change.  HENT: Negative for mouth sores.   Eyes: Negative for visual disturbance.  Respiratory: Negative for cough, chest tightness, shortness of breath and wheezing.   Cardiovascular: Negative for chest pain.  Gastrointestinal: Positive for nausea and abdominal pain. Negative for vomiting, diarrhea and constipation.  Endocrine: Negative for polydipsia, polyphagia and polyuria.  Genitourinary: Negative for dysuria, urgency, frequency and hematuria.  Musculoskeletal: Positive for back pain. Negative for neck stiffness.  Skin: Negative for rash.  Allergic/Immunologic: Negative for immunocompromised state.  Neurological: Negative for syncope, light-headedness and headaches.  Hematological: Does not bruise/bleed easily.  Psychiatric/Behavioral: Negative for sleep disturbance. The patient is not nervous/anxious.     BP 147/80 mmHg  Pulse 56  Temp(Src) 97.4 F (36.3 C) (Oral)  Resp 16  SpO2 100%  Physical Exam  Constitutional: He appears well-developed and well-nourished. No distress.  HENT:  Head: Normocephalic and atraumatic.  Mouth/Throat: Oropharynx is clear and moist. No oropharyngeal exudate.  Eyes: Conjunctivae are normal. No scleral icterus.  Neck: Normal range of motion. Neck supple.  Full ROM without pain  Cardiovascular: Normal rate,  regular rhythm, normal heart sounds and intact distal pulses.   No murmur heard. Pulmonary/Chest: Effort normal and breath sounds normal. No respiratory distress. He has no wheezes.  Abdominal: Soft. Bowel sounds are normal. He exhibits no distension and no mass. There is tenderness in the left lower quadrant. There is guarding. There is no rebound and no CVA tenderness.  Musculoskeletal:  Full range of motion of the T-spine and L-spine No tenderness to palpation of the spinous processes of the T-spine or L-spine No tenderness to palpation of the paraspinous muscles of the L-spine  GU: Normal penis. No discharge. No tenderness to palpation of the testicle or epididymis. Lymphadenopathy:    He has no cervical adenopathy.  Neurological: He is alert. He has normal reflexes.  Reflex Scores:      Bicep reflexes are 2+ on the right side and 2+ on the left side.      Brachioradialis reflexes are 2+ on the right side and 2+ on the left side.      Patellar reflexes are 2+ on the right side and 2+ on the left side.      Achilles reflexes are 2+ on the right side and 2+ on the left side. Speech is clear and goal oriented, follows commands Normal 5/5 strength in upper and lower extremities bilaterally including dorsiflexion and plantar flexion, strong and equal grip strength Sensation normal to light and sharp touch Moves extremities without ataxia, coordination intact Normal gait Normal balance No Clonus  Skin: Skin is warm and dry. No rash noted. He is not diaphoretic. No erythema.  Psychiatric: He has a normal mood and affect. His behavior is normal.  Nursing note and vitals reviewed.   Roberto Knapp presents with LLQ abd pain and guarding.  Will obtain labs and image.  Labs with leukocytosis but otherwise reassuring. Urinalysis with evidence of urethritis/cystitis with 7-10 white blood cells and large hemoglobin with many bacteria.  Patient reports he is in a monogamous relationship with 1  partner. Will treat for STDs as well as nongonococcal urethritis.  Urine culture sent.    CT scan without acute findings.  No evidence of orchitis or prostatitis on physical exam. Patient will be discharged home with treatment for his urethritis. Strict return precautions given.  On repeat exam patient abdomen is soft and nontender. He is tolerating by mouth without difficulty.  STD screen performed.  Pt was seen by Cheri Fowler and supervised by Dierdre Forth, PA-C and Arby Barrette, MD    [facility], PA-C 10/21/14 1626  Arby Barrette, MD 10/23/14 [phone]

## 2014-10-21 NOTE — ED Notes (Addendum)
Pt reports sudden onset LLQ pain this am. Pt was lifting wood at work around the time pain began. Some N, no V. Tried to have BM, but was unsuccessful. EMS gave fentanyl and 4 mg zofran prior to arrival with no relief of pain.

## 2014-10-21 NOTE — ED Notes (Signed)
Bed: WA16 Expected date:  Expected time:  Means of arrival:  Comments: EMS 

## 2014-10-21 NOTE — ED Provider Notes (Signed)
CSN: [id number]     Arrival date & time 10/21/14  6387 History   First MD Initiated Contact with Patient 10/21/14 [phone]     Chief Complaint  Patient presents with  . Abdominal Pain     (Consider location/radiation/quality/duration/timing/severity/associated sxs/prior Treatment) HPI  Patient with PMH of intentional drug overdose is a [employer] investment [employer] and presents with sudden onset consistent pulsating LLQ pain radiating to the lower back that began around 7 AM this morning.  He states the pain is 10/10 and is so bad that he felt like was going to pass out.  Associated symptoms include nausea. Nothing like this has every happened before and he has no history of diverticulitis, kidney stones, or gall bladder issues.  He states he felt like he had to poop, but found no relief.  Patient states he has had the same sexual partner for a year and a half and was tested for STDs a couple of months ago. He endorses occasional alcohol use.  Tobacco history-1 ppd, 20 pack year history.  Denies illicit drug use.  Denies CP, SOB, vomiting, burning with urination or increased frequency.    Past Medical History  Diagnosis Date  . Drug overdose, intentional    Past Surgical History  Procedure Laterality Date  . No past surgeries    . Femoral-popliteal bypass graft  09/08/2011    Procedure: BYPASS GRAFT FEMORAL-POPLITEAL ARTERY;  Surgeon: Sherren Kerns, MD;  Location: [facility] OR;  Service: Vascular;  Laterality: Left;  . Fasciotomy  09/08/2011    Procedure: FASCIOTOMY;  Surgeon: Sherren Kerns, MD;  Location: [facility] OR;  Service: Vascular;  Laterality: Left;   History reviewed. No pertinent family history. Social History  Substance Use Topics  . Smoking status: Current Every Day Smoker -- 0.50 packs/day for 25 years    Types: Cigarettes  . Smokeless tobacco: Never Used     Comment: pt states that he is trying to quit and has cut back  . Alcohol Use: Yes     Comment: pt states that he drinks liquor and beer  but has not in a while and does not know what he averages a week because it has been so inconsistant    Review of Systems All other systems negative unless otherwise stated in HPI.    Allergies  Review of patient's allergies indicates no known allergies.  Home Medications   Prior to Admission medications   Medication Sig Start Date End Date Taking? Authorizing Provider  amphetamine-dextroamphetamine (ADDERALL XR) 20 MG 24 hr capsule Take 20 mg by mouth daily.   Yes Historical Provider, MD  ibuprofen (ADVIL,MOTRIN) 200 MG tablet Take 400 mg by mouth every 6 (six) hours as needed for fever, headache, mild pain, moderate pain or cramping.   Yes Historical Provider, MD   BP 147/80 mmHg  Pulse 56  Temp(Src) 97.4 F (36.3 C) (Oral)  Resp 16  SpO2 100% Physical Exam  Constitutional: He is oriented to person, place, and time. He appears well-developed and well-nourished.  HENT:  Head: Normocephalic and atraumatic.  Mouth/Throat: Oropharynx is clear and moist.  Neck: Normal range of motion. Neck supple.  Cardiovascular: Normal rate and regular rhythm.   No murmur heard. Pulmonary/Chest: Effort normal. No respiratory distress. He has wheezes. He has no rales.  Anterior apical wheezing present on expiration  Abdominal: Soft. Bowel sounds are normal. He exhibits no distension. There is tenderness in the left lower quadrant. There is guarding. There is no CVA tenderness. Hernia confirmed  negative in the right inguinal area and confirmed negative in the left inguinal area.  Genitourinary: Rectum normal, prostate normal, testes normal and penis normal. Prostate is not enlarged and not tender. Right testis shows no swelling and no tenderness. Left testis shows no swelling and no tenderness. Circumcised.  Lymphadenopathy:    He has no cervical adenopathy.  Neurological: He is alert and oriented to person, place, and time.  Psychiatric: He has a normal mood and affect. His behavior is normal.      ED Course  Procedures (including critical care time) Labs Review Labs Reviewed  LIPASE, BLOOD - Abnormal; Notable for the following:    Lipase 20 (*)    All other components within normal limits  COMPREHENSIVE METABOLIC PANEL - Abnormal; Notable for the following:    Glucose, Bld 110 (*)    ALT 15 (*)    All other components within normal limits  CBC - Abnormal; Notable for the following:    WBC 16.2 (*)    All other components within normal limits  URINALYSIS, ROUTINE W REFLEX MICROSCOPIC (NOT AT [facility]) - Abnormal; Notable for the following:    Color, Urine AMBER (*)    APPearance CLOUDY (*)    Glucose, UA 100 (*)    Hgb urine dipstick LARGE (*)    Bilirubin Urine SMALL (*)    Ketones, ur 40 (*)    Protein, ur 30 (*)    Leukocytes, UA TRACE (*)    All other components within normal limits  URINE MICROSCOPIC-ADD ON - Abnormal; Notable for the following:    Squamous Epithelial / LPF FEW (*)    Bacteria, UA MANY (*)    All other components within normal limits  ETHANOL    Imaging Review Ct Abdomen Pelvis W Contrast  10/21/2014   CLINICAL DATA:  Sudden onset of left lower quadrant abdominal pain this morning.  EXAM: CT ABDOMEN AND PELVIS WITH CONTRAST  TECHNIQUE: Multidetector CT imaging of the abdomen and pelvis was performed using the standard protocol following bolus administration of intravenous contrast.  CONTRAST:  1 OMNIPAQUE IOHEXOL 300 MG/ML SOLN, OMNIPAQUE IOHEXOL 300 MG/ML SOLN  COMPARISON:  Lower chest: The lung bases are clear. No pleural effusion or pulmonary lesions. The heart is normal in size. No pericardial effusion the distal esophagus is grossly normal.  Hepatobiliary: No focal hepatic lesions or intrahepatic biliary dilatation. The gallbladder is normal. No common bile duct dilatation.  Pancreas: No mass, inflammation or ductal dilatation.  Spleen: Normal size.  No focal lesions.  Adrenals/Urinary Tract: The adrenal glands and kidneys are normal. No  renal or obstructing ureteral calculi or bladder calculi. No renal or bladder mass.  Stomach/Bowel: The stomach, duodenum, small bowel and colon are grossly normal. No inflammatory changes, mass lesions or obstructive findings. The terminal ileum is normal. The appendix is normal.  Vascular/Lymphatic: No mesenteric or retroperitoneal mass or adenopathy. The aorta and branch vessels are patent. The major venous structures are patent.  Other: The bladder, prostate gland and seminal vesicles are unremarkable. No pelvic mass, adenopathy or free pelvic fluid collections. No inguinal mass or inguinal hernia.  Musculoskeletal: The bony structures are normal.  FINDINGS: No acute abdominal/ pelvic findings, mass lesions or adenopathy. I do not see a cause for the patient's left lower quadrant abdominal pain. No abdominal wall or inguinal hernia.   Electronically Signed   By: Rudie Meyer M.D.   On: 10/21/2014 11:55   I have personally reviewed and evaluated these  images and lab results as part of my medical decision-making.   EKG Interpretation None      MDM   Final diagnoses:  UTI (lower urinary tract infection)  Left lower quadrant pain  Concern about STD in male without diagnosis    1. UTI -UA reveals large hgb, ketones, and protein as well as WBC suggestive on infection -CBC with elevated WBC -CT negative for acute abdominal/pelvic findings.  -Treated with Azithromycin and Rocephin in ED -Patient sent home with Vicodin for pain control and Keflex  -Please return if symptoms worsen or persist  2. LLQ pain -see above treatment plan    Cheri Fowler, PA 10/21/14 1421  Arby Barrette, MD 10/23/14 [phone]

## 2014-10-22 LAB — URINE CULTURE: Culture: NO GROWTH

## 2014-10-26 LAB — GC/CHLAMYDIA PROBE AMP (~~LOC~~) NOT AT ARMC
Chlamydia: NEGATIVE
Neisseria Gonorrhea: NEGATIVE

## 2015-06-08 ENCOUNTER — Encounter (HOSPITAL_COMMUNITY): Payer: Self-pay

## 2015-06-08 ENCOUNTER — Emergency Department (HOSPITAL_COMMUNITY)
Admission: EM | Admit: 2015-06-08 | Discharge: 2015-06-08 | Disposition: A | Discharge status: Home / Self Care | Payer: 59 | Attending: Emergency Medicine | Admitting: Emergency Medicine

## 2015-06-08 DIAGNOSIS — F17210 Nicotine dependence, cigarettes, uncomplicated: Secondary | ICD-10-CM | POA: Diagnosis not present

## 2015-06-08 DIAGNOSIS — L259 Unspecified contact dermatitis, unspecified cause: Secondary | ICD-10-CM | POA: Diagnosis not present

## 2015-06-08 DIAGNOSIS — Z79899 Other long term (current) drug therapy: Secondary | ICD-10-CM | POA: Insufficient documentation

## 2015-06-08 DIAGNOSIS — Z792 Long term (current) use of antibiotics: Secondary | ICD-10-CM | POA: Insufficient documentation

## 2015-06-08 DIAGNOSIS — R21 Rash and other nonspecific skin eruption: Secondary | ICD-10-CM | POA: Diagnosis present

## 2015-06-08 DIAGNOSIS — Z915 Personal history of self-harm: Secondary | ICD-10-CM | POA: Diagnosis not present

## 2015-06-08 MED ORDER — DEXAMETHASONE SODIUM PHOSPHATE 10 MG/ML IJ SOLN
10.0000 mg | Freq: Once | INTRAMUSCULAR | Status: AC
  Administered 2015-06-08: 10 mg via INTRAMUSCULAR
  Filled 2015-06-08: qty 1

## 2015-06-08 MED ORDER — DIPHENHYDRAMINE HCL 25 MG PO CAPS
25.0000 mg | ORAL_CAPSULE | Freq: Once | ORAL | Status: AC
  Administered 2015-06-08: 25 mg via ORAL
  Filled 2015-06-08: qty 1

## 2015-06-08 MED ORDER — DEXAMETHASONE 4 MG PO TABS: 4.0000 mg | ORAL_TABLET | Freq: Two times a day (BID) | ORAL | Status: DC

## 2015-06-08 NOTE — ED Notes (Addendum)
Verbalized understanding discharge instructions. In no acute distress.  Pt given a work note stating that symptoms started on 4/2.

## 2015-06-08 NOTE — ED Provider Notes (Signed)
CSN: [id number]     Arrival date & time 06/08/15  7829 History   First MD Initiated Contact with Patient 06/08/15 [phone]     Chief Complaint  Patient presents with  . Rash     (Consider location/radiation/quality/duration/timing/severity/associated sxs/prior Treatment) HPI   44yM with rash. Very itchy. Onset shortly after working in his yard Sunday. Getting married and was getting his yard prepared for ceremony. Was spreading various products/chemicals. Cannot get comfortable because of itching. Has been carrying around a back scratcher. No respiratory complaints. No GI complaints. No dizziness, lightheadedness or SOB. No contacts with similar. Has not tried anything for symptoms.   Past Medical History  Diagnosis Date  . Drug overdose, intentional [facility])    Past Surgical History  Procedure Laterality Date  . No past surgeries    . Femoral-popliteal bypass graft  09/08/2011    Procedure: BYPASS GRAFT FEMORAL-POPLITEAL ARTERY;  Surgeon: Sherren Kerns, MD;  Location: [facility] OR;  Service: Vascular;  Laterality: Left;  . Fasciotomy  09/08/2011    Procedure: FASCIOTOMY;  Surgeon: Sherren Kerns, MD;  Location: [facility] OR;  Service: Vascular;  Laterality: Left;   History reviewed. No pertinent family history. Social History  Substance Use Topics  . Smoking status: Current Every Day Smoker -- 0.50 packs/day for 25 years    Types: Cigarettes  . Smokeless tobacco: Never Used     Comment: pt states that he is trying to quit and has cut back  . Alcohol Use: Yes     Comment: pt states that he drinks liquor and beer but has not in a while and does not know what he averages a week because it has been so inconsistant    Review of Systems  All systems reviewed and negative, other than as noted in HPI.   Allergies  Review of patient's allergies indicates no known allergies.  Home Medications   Prior to Admission medications   Medication Sig Start Date End Date Taking? Authorizing Provider   amphetamine-dextroamphetamine (ADDERALL XR) 20 MG 24 hr capsule Take 20 mg by mouth daily.    Historical Provider, MD  cephALEXin (KEFLEX) 500 MG capsule Take 1 capsule (500 mg total) by mouth 4 (four) times daily. 10/21/14   Cheri Fowler, PA-C  dexamethasone (DECADRON) 4 MG tablet Take 1 tablet (4 mg total) by mouth 2 (two) times daily. 06/08/15   Raeford Razor, MD  HYDROcodone-acetaminophen (NORCO/VICODIN) 5-325 MG per tablet Take 2 tablets by mouth every 4 (four) hours as needed. 10/21/14   Cheri Fowler, PA-C  ibuprofen (ADVIL,MOTRIN) 200 MG tablet Take 400 mg by mouth every 6 (six) hours as needed for fever, headache, mild pain, moderate pain or cramping.    Historical Provider, MD   BP 114/76 mmHg  Pulse 70  Temp(Src) 97.8 F (36.6 C) (Oral)  Resp 18  SpO2 99% Physical Exam  Constitutional: He appears well-developed and well-nourished. No distress.  HENT:  Head: Normocephalic and atraumatic.  Eyes: Conjunctivae are normal. Right eye exhibits no discharge. Left eye exhibits no discharge.  Neck: Neck supple.  Cardiovascular: Normal rate, regular rhythm and normal heart sounds.  Exam reveals no gallop and no friction rub.   No murmur heard. Pulmonary/Chest: Effort normal and breath sounds normal. No respiratory distress.  Abdominal: Soft. He exhibits no distension. There is no tenderness.  Musculoskeletal: He exhibits no edema or tenderness.  Neurological: He is alert.  Skin: Skin is warm and dry. Rash noted.  Splotchy rythematous papular rash from top  of shoulder to upper lumbar region. No drainage.   Psychiatric: He has a normal mood and affect. His behavior is normal. Thought content normal.  Nursing note and vitals reviewed.   ED Course  Procedures (including critical care time) Labs Review Labs Reviewed - No data to display  Imaging Review No results found. I have personally reviewed and evaluated these images and lab results as part of my medical decision-making.   EKG  Interpretation None      MDM   Final diagnoses:  Contact dermatitis    44yM with intensely pruritic rash to upper/mid back. Related to chemical exposure on Sunday? No respiratory or GI complaints. No mucus membrane involvement.  HD stable.  Plan steroids. PRN benadryl.  Return precautions discussed.    Raeford RazorStephen Diavion Labrador, MD 06/13/15 [phone]

## 2015-06-08 NOTE — Discharge Instructions (Signed)
Contact Dermatitis Dermatitis is redness, soreness, and swelling (inflammation) of the skin. Contact dermatitis is a reaction to certain substances that touch the skin. There are two types of contact dermatitis:   Irritant contact dermatitis. This type is caused by something that irritates your skin, such as dry hands from washing them too much. This type does not require previous exposure to the substance for a reaction to occur. This type is more common.  Allergic contact dermatitis. This type is caused by a substance that you are allergic to, such as a nickel allergy or poison ivy. This type only occurs if you have been exposed to the substance (allergen) before. Upon a repeat exposure, your body reacts to the substance. This type is less common. CAUSES  Many different substances can cause contact dermatitis. Irritant contact dermatitis is most commonly caused by exposure to:   Makeup.   Soaps.   Detergents.   Bleaches.   Acids.   Metal salts, such as nickel.  Allergic contact dermatitis is most commonly caused by exposure to:   Poisonous plants.   Chemicals.   Jewelry.   Latex.   Medicines.   Preservatives in products, such as clothing.  RISK FACTORS This condition is more likely to develop in:   People who have jobs that expose them to irritants or allergens.  People who have certain medical conditions, such as asthma or eczema.  SYMPTOMS  Symptoms of this condition may occur anywhere on your body where the irritant has touched you or is touched by you. Symptoms include:  Dryness or flaking.   Redness.   Cracks.   Itching.   Pain or a burning feeling.   Blisters.  Drainage of small amounts of blood or clear fluid from skin cracks. With allergic contact dermatitis, there may also be swelling in areas such as the eyelids, mouth, or genitals.  DIAGNOSIS  This condition is diagnosed with a medical history and physical exam. A patch skin test  may be performed to help determine the cause. If the condition is related to your job, you may need to see an occupational medicine specialist. TREATMENT Treatment for this condition includes figuring out what caused the reaction and protecting your skin from further contact. Treatment may also include:   Steroid creams or ointments. Oral steroid medicines may be needed in more severe cases.  Antibiotics or antibacterial ointments, if a skin infection is present.  Antihistamine lotion or an antihistamine taken by mouth to ease itching.  A bandage (dressing). HOME CARE INSTRUCTIONS Skin Care  Moisturize your skin as needed.   Apply cool compresses to the affected areas.  Try taking a bath with:  Epsom salts. Follow the instructions on the packaging. You can get these at your local pharmacy or grocery store.  Baking soda. Pour a small amount into the bath as directed by your health care provider.  Colloidal oatmeal. Follow the instructions on the packaging. You can get this at your local pharmacy or grocery store.  Try applying baking soda paste to your skin. Stir water into baking soda until it reaches a paste-like consistency.  Do not scratch your skin.  Bathe less frequently, such as every other day.  Bathe in lukewarm water. Avoid using hot water. Medicines  Take or apply over-the-counter and prescription medicines only as told by your health care provider.   If you were prescribed an antibiotic medicine, take or apply your antibiotic as told by your health care provider. Do not stop using the   antibiotic even if your condition starts to improve. General Instructions  Keep all follow-up visits as told by your health care provider. This is important.  Avoid the substance that caused your reaction. If you do not know what caused it, keep a journal to try to track what caused it. Write down:  What you eat.  What cosmetic products you use.  What you drink.  What  you wear in the affected area. This includes jewelry.  If you were given a dressing, take care of it as told by your health care provider. This includes when to change and remove it. SEEK MEDICAL CARE IF:   Your condition does not improve with treatment.  Your condition gets worse.  You have signs of infection such as swelling, tenderness, redness, soreness, or warmth in the affected area.  You have a fever.  You have new symptoms. SEEK IMMEDIATE MEDICAL CARE IF:   You have a severe headache, neck pain, or neck stiffness.  You vomit.  You feel very sleepy.  You notice red streaks coming from the affected area.  Your bone or joint underneath the affected area becomes painful after the skin has healed.  The affected area turns darker.  You have difficulty breathing.   This information is not intended to replace advice given to you by your health care provider. Make sure you discuss any questions you have with your health care provider.   Document Released: 02/17/2000 Document Revised: 11/10/2014 Document Reviewed: 07/07/2014 Elsevier Interactive Patient Education 2016 [employer]  

## 2015-06-08 NOTE — ED Notes (Signed)
Pt worked in yard on Sunday.  Soon after began itching and noticed rash on back.  Cannot sleep d/t itching. Has not taking meds

## 2015-09-30 ENCOUNTER — Other Ambulatory Visit: Payer: Self-pay | Admitting: Family Medicine

## 2015-09-30 DIAGNOSIS — G8929 Other chronic pain: Secondary | ICD-10-CM

## 2015-09-30 DIAGNOSIS — M545 Low back pain, unspecified: Secondary | ICD-10-CM

## 2015-10-09 ENCOUNTER — Other Ambulatory Visit: Payer: 59

## 2015-10-12 ENCOUNTER — Ambulatory Visit
Admission: RE | Admit: 2015-10-12 | Discharge: 2015-10-12 | Disposition: A | Discharge status: Home / Self Care | Payer: 59 | Source: Ambulatory Visit | Attending: Family Medicine | Admitting: Family Medicine

## 2015-10-12 DIAGNOSIS — M545 Low back pain, unspecified: Secondary | ICD-10-CM

## 2015-10-12 DIAGNOSIS — G8929 Other chronic pain: Secondary | ICD-10-CM

## 2016-03-16 DIAGNOSIS — Z720 Tobacco use: Secondary | ICD-10-CM | POA: Diagnosis not present

## 2016-09-13 DIAGNOSIS — Z87891 Personal history of nicotine dependence: Secondary | ICD-10-CM | POA: Diagnosis not present

## 2016-09-13 DIAGNOSIS — Z0000 Encounter for general adult medical examination without abnormal findings: Secondary | ICD-10-CM | POA: Diagnosis not present

## 2016-09-14 DIAGNOSIS — Z0000 Encounter for general adult medical examination without abnormal findings: Secondary | ICD-10-CM | POA: Diagnosis not present

## 2016-11-08 DIAGNOSIS — L03116 Cellulitis of left lower limb: Secondary | ICD-10-CM | POA: Diagnosis not present

## 2017-01-05 DIAGNOSIS — Z23 Encounter for immunization: Secondary | ICD-10-CM | POA: Diagnosis not present

## 2017-04-14 DIAGNOSIS — M79602 Pain in left arm: Secondary | ICD-10-CM | POA: Diagnosis not present

## 2018-02-05 ENCOUNTER — Emergency Department (HOSPITAL_COMMUNITY)
Admission: EM | Admit: 2018-02-05 | Discharge: 2018-02-05 | Disposition: A | Discharge status: Home / Self Care | Payer: BLUE CROSS/BLUE SHIELD | Attending: Emergency Medicine | Admitting: Emergency Medicine

## 2018-02-05 ENCOUNTER — Encounter (HOSPITAL_COMMUNITY): Payer: Self-pay | Admitting: Emergency Medicine

## 2018-02-05 DIAGNOSIS — Z5321 Procedure and treatment not carried out due to patient leaving prior to being seen by health care provider: Secondary | ICD-10-CM | POA: Diagnosis not present

## 2018-02-05 DIAGNOSIS — R509 Fever, unspecified: Secondary | ICD-10-CM | POA: Insufficient documentation

## 2018-02-05 DIAGNOSIS — M7918 Myalgia, other site: Secondary | ICD-10-CM | POA: Diagnosis not present

## 2018-02-05 DIAGNOSIS — R1110 Vomiting, unspecified: Secondary | ICD-10-CM | POA: Diagnosis not present

## 2018-02-05 DIAGNOSIS — R197 Diarrhea, unspecified: Secondary | ICD-10-CM | POA: Diagnosis not present

## 2018-02-05 MED ORDER — ONDANSETRON 4 MG PO TBDP
4.0000 mg | ORAL_TABLET | Freq: Once | ORAL | Status: AC
  Administered 2018-02-05: 4 mg via ORAL
  Filled 2018-02-05: qty 1

## 2018-02-05 NOTE — ED Triage Notes (Signed)
Pt reports since Friday started having chills, body aches, fevers, v/d. Been taking ibuprofen and cold medications for symptoms. Last dose ibuprofen was at 9am

## 2018-02-05 NOTE — ED Notes (Signed)
No answer for blood draw.

## 2018-02-09 ENCOUNTER — Inpatient Hospital Stay (HOSPITAL_COMMUNITY)
Admission: EM | Admit: 2018-02-09 | Discharge: 2018-03-06 | DRG: 870 | Disposition: A | Discharge status: Home / Self Care | Payer: BLUE CROSS/BLUE SHIELD | Attending: Internal Medicine | Admitting: Internal Medicine

## 2018-02-09 ENCOUNTER — Other Ambulatory Visit: Payer: Self-pay

## 2018-02-09 ENCOUNTER — Encounter (HOSPITAL_COMMUNITY): Payer: Self-pay

## 2018-02-09 ENCOUNTER — Emergency Department (HOSPITAL_COMMUNITY): Payer: BLUE CROSS/BLUE SHIELD

## 2018-02-09 DIAGNOSIS — R0602 Shortness of breath: Secondary | ICD-10-CM

## 2018-02-09 DIAGNOSIS — K625 Hemorrhage of anus and rectum: Secondary | ICD-10-CM

## 2018-02-09 DIAGNOSIS — A419 Sepsis, unspecified organism: Secondary | ICD-10-CM | POA: Diagnosis present

## 2018-02-09 DIAGNOSIS — R945 Abnormal results of liver function studies: Secondary | ICD-10-CM

## 2018-02-09 DIAGNOSIS — G4700 Insomnia, unspecified: Secondary | ICD-10-CM | POA: Diagnosis present

## 2018-02-09 DIAGNOSIS — E874 Mixed disorder of acid-base balance: Secondary | ICD-10-CM | POA: Diagnosis present

## 2018-02-09 DIAGNOSIS — Z915 Personal history of self-harm: Secondary | ICD-10-CM

## 2018-02-09 DIAGNOSIS — J449 Chronic obstructive pulmonary disease, unspecified: Secondary | ICD-10-CM | POA: Diagnosis present

## 2018-02-09 DIAGNOSIS — E876 Hypokalemia: Secondary | ICD-10-CM | POA: Diagnosis present

## 2018-02-09 DIAGNOSIS — R1310 Dysphagia, unspecified: Secondary | ICD-10-CM | POA: Diagnosis present

## 2018-02-09 DIAGNOSIS — Z8249 Family history of ischemic heart disease and other diseases of the circulatory system: Secondary | ICD-10-CM

## 2018-02-09 DIAGNOSIS — A481 Legionnaires' disease: Secondary | ICD-10-CM | POA: Diagnosis present

## 2018-02-09 DIAGNOSIS — I10 Essential (primary) hypertension: Secondary | ICD-10-CM | POA: Diagnosis present

## 2018-02-09 DIAGNOSIS — J189 Pneumonia, unspecified organism: Secondary | ICD-10-CM | POA: Diagnosis not present

## 2018-02-09 DIAGNOSIS — R609 Edema, unspecified: Secondary | ICD-10-CM | POA: Diagnosis not present

## 2018-02-09 DIAGNOSIS — K922 Gastrointestinal hemorrhage, unspecified: Secondary | ICD-10-CM | POA: Diagnosis present

## 2018-02-09 DIAGNOSIS — I82612 Acute embolism and thrombosis of superficial veins of left upper extremity: Secondary | ICD-10-CM | POA: Diagnosis present

## 2018-02-09 DIAGNOSIS — I480 Paroxysmal atrial fibrillation: Secondary | ICD-10-CM | POA: Diagnosis present

## 2018-02-09 DIAGNOSIS — L89890 Pressure ulcer of other site, unstageable: Secondary | ICD-10-CM | POA: Diagnosis not present

## 2018-02-09 DIAGNOSIS — Z9289 Personal history of other medical treatment: Secondary | ICD-10-CM

## 2018-02-09 DIAGNOSIS — F05 Delirium due to known physiological condition: Secondary | ICD-10-CM | POA: Diagnosis present

## 2018-02-09 DIAGNOSIS — R7401 Elevation of levels of liver transaminase levels: Secondary | ICD-10-CM | POA: Diagnosis present

## 2018-02-09 DIAGNOSIS — R1084 Generalized abdominal pain: Secondary | ICD-10-CM

## 2018-02-09 DIAGNOSIS — J9601 Acute respiratory failure with hypoxia: Secondary | ICD-10-CM | POA: Diagnosis present

## 2018-02-09 DIAGNOSIS — I4891 Unspecified atrial fibrillation: Secondary | ICD-10-CM | POA: Diagnosis not present

## 2018-02-09 DIAGNOSIS — J690 Pneumonitis due to inhalation of food and vomit: Secondary | ICD-10-CM | POA: Diagnosis present

## 2018-02-09 DIAGNOSIS — R0902 Hypoxemia: Secondary | ICD-10-CM

## 2018-02-09 DIAGNOSIS — R5381 Other malaise: Secondary | ICD-10-CM | POA: Diagnosis not present

## 2018-02-09 DIAGNOSIS — Z992 Dependence on renal dialysis: Secondary | ICD-10-CM

## 2018-02-09 DIAGNOSIS — N170 Acute kidney failure with tubular necrosis: Secondary | ICD-10-CM | POA: Diagnosis present

## 2018-02-09 DIAGNOSIS — R740 Nonspecific elevation of levels of transaminase and lactic acid dehydrogenase [LDH]: Secondary | ICD-10-CM | POA: Diagnosis not present

## 2018-02-09 DIAGNOSIS — R52 Pain, unspecified: Secondary | ICD-10-CM | POA: Diagnosis not present

## 2018-02-09 DIAGNOSIS — N289 Disorder of kidney and ureter, unspecified: Secondary | ICD-10-CM | POA: Insufficient documentation

## 2018-02-09 DIAGNOSIS — F17210 Nicotine dependence, cigarettes, uncomplicated: Secondary | ICD-10-CM | POA: Diagnosis present

## 2018-02-09 DIAGNOSIS — Z9911 Dependence on respirator [ventilator] status: Secondary | ICD-10-CM | POA: Diagnosis not present

## 2018-02-09 DIAGNOSIS — E861 Hypovolemia: Secondary | ICD-10-CM | POA: Diagnosis present

## 2018-02-09 DIAGNOSIS — K529 Noninfective gastroenteritis and colitis, unspecified: Secondary | ICD-10-CM | POA: Diagnosis present

## 2018-02-09 DIAGNOSIS — I739 Peripheral vascular disease, unspecified: Secondary | ICD-10-CM | POA: Diagnosis present

## 2018-02-09 DIAGNOSIS — D62 Acute posthemorrhagic anemia: Secondary | ICD-10-CM | POA: Diagnosis present

## 2018-02-09 DIAGNOSIS — K2970 Gastritis, unspecified, without bleeding: Secondary | ICD-10-CM | POA: Diagnosis present

## 2018-02-09 DIAGNOSIS — G92 Toxic encephalopathy: Secondary | ICD-10-CM | POA: Diagnosis present

## 2018-02-09 DIAGNOSIS — J80 Acute respiratory distress syndrome: Secondary | ICD-10-CM

## 2018-02-09 DIAGNOSIS — Z8701 Personal history of pneumonia (recurrent): Secondary | ICD-10-CM

## 2018-02-09 DIAGNOSIS — E871 Hypo-osmolality and hyponatremia: Secondary | ICD-10-CM | POA: Diagnosis present

## 2018-02-09 DIAGNOSIS — Z452 Encounter for adjustment and management of vascular access device: Secondary | ICD-10-CM

## 2018-02-09 DIAGNOSIS — J9690 Respiratory failure, unspecified, unspecified whether with hypoxia or hypercapnia: Secondary | ICD-10-CM

## 2018-02-09 DIAGNOSIS — J9600 Acute respiratory failure, unspecified whether with hypoxia or hypercapnia: Secondary | ICD-10-CM

## 2018-02-09 DIAGNOSIS — E860 Dehydration: Secondary | ICD-10-CM | POA: Diagnosis present

## 2018-02-09 DIAGNOSIS — R6521 Severe sepsis with septic shock: Secondary | ICD-10-CM | POA: Diagnosis present

## 2018-02-09 DIAGNOSIS — R110 Nausea: Secondary | ICD-10-CM

## 2018-02-09 DIAGNOSIS — R4702 Dysphasia: Secondary | ICD-10-CM | POA: Diagnosis present

## 2018-02-09 DIAGNOSIS — R7989 Other specified abnormal findings of blood chemistry: Secondary | ICD-10-CM

## 2018-02-09 DIAGNOSIS — L8990 Pressure ulcer of unspecified site, unspecified stage: Secondary | ICD-10-CM

## 2018-02-09 LAB — CBC WITH DIFFERENTIAL/PLATELET
Abs Immature Granulocytes: 0.43 10*3/uL — ABNORMAL HIGH (ref 0.00–0.07)
Basophils Absolute: 0 10*3/uL (ref 0.0–0.1)
Basophils Relative: 0 %
Eosinophils Absolute: 0 10*3/uL (ref 0.0–0.5)
Eosinophils Relative: 0 %
HCT: 33.7 % — ABNORMAL LOW (ref 39.0–52.0)
Hemoglobin: 11.6 g/dL — ABNORMAL LOW (ref 13.0–17.0)
Immature Granulocytes: 2 %
Lymphocytes Relative: 2 %
Lymphs Abs: 0.4 10*3/uL — ABNORMAL LOW (ref 0.7–4.0)
MCH: 30.4 pg (ref 26.0–34.0)
MCHC: 34.4 g/dL (ref 30.0–36.0)
MCV: 88.5 fL (ref 80.0–100.0)
Monocytes Absolute: 0.2 10*3/uL (ref 0.1–1.0)
Monocytes Relative: 1 %
Neutro Abs: 17.7 10*3/uL — ABNORMAL HIGH (ref 1.7–7.7)
Neutrophils Relative %: 95 %
Platelets: 217 10*3/uL (ref 150–400)
RBC: 3.81 MIL/uL — ABNORMAL LOW (ref 4.22–5.81)
RDW: 13.3 % (ref 11.5–15.5)
WBC Morphology: INCREASED
WBC: 18.8 10*3/uL — ABNORMAL HIGH (ref 4.0–10.5)
nRBC: 0 % (ref 0.0–0.2)

## 2018-02-09 LAB — TYPE AND SCREEN
ABO/RH(D): A POS
Antibody Screen: NEGATIVE

## 2018-02-09 LAB — OCCULT BLOOD X 1 CARD TO LAB, STOOL: Fecal Occult Bld: POSITIVE — AB

## 2018-02-09 LAB — URINALYSIS, ROUTINE W REFLEX MICROSCOPIC
Bilirubin Urine: NEGATIVE
Glucose, UA: NEGATIVE mg/dL
Ketones, ur: NEGATIVE mg/dL
Leukocytes, UA: NEGATIVE
Nitrite: NEGATIVE
Protein, ur: 100 mg/dL — AB
Specific Gravity, Urine: 1.019 (ref 1.005–1.030)
pH: 6 (ref 5.0–8.0)

## 2018-02-09 LAB — COMPREHENSIVE METABOLIC PANEL
ALT: 57 U/L — ABNORMAL HIGH (ref 0–44)
AST: 136 U/L — ABNORMAL HIGH (ref 15–41)
Albumin: 1.7 g/dL — ABNORMAL LOW (ref 3.5–5.0)
Alkaline Phosphatase: 90 U/L (ref 38–126)
Anion gap: 10 (ref 5–15)
BUN: 23 mg/dL — ABNORMAL HIGH (ref 6–20)
CO2: 19 mmol/L — ABNORMAL LOW (ref 22–32)
Calcium: 7.4 mg/dL — ABNORMAL LOW (ref 8.9–10.3)
Chloride: 92 mmol/L — ABNORMAL LOW (ref 98–111)
Creatinine, Ser: 1.2 mg/dL (ref 0.61–1.24)
GFR calc Af Amer: >60 mL/min (ref >60)
GFR calc non Af Amer: >60 mL/min (ref >60)
Glucose, Bld: 105 mg/dL — ABNORMAL HIGH (ref 70–99)
Potassium: 3 mmol/L — ABNORMAL LOW (ref 3.5–5.1)
Sodium: 121 mmol/L — ABNORMAL LOW (ref 135–145)
Total Bilirubin: 1.2 mg/dL (ref 0.3–1.2)
Total Protein: 5.6 g/dL — ABNORMAL LOW (ref 6.5–8.1)

## 2018-02-09 LAB — RAPID URINE DRUG SCREEN, HOSP PERFORMED
Amphetamines: NOT DETECTED
Barbiturates: NOT DETECTED
Benzodiazepines: NOT DETECTED
Cocaine: NOT DETECTED
Opiates: NOT DETECTED
Tetrahydrocannabinol: POSITIVE — AB

## 2018-02-09 LAB — BASIC METABOLIC PANEL
Anion gap: 13 (ref 5–15)
BUN: 23 mg/dL — ABNORMAL HIGH (ref 6–20)
CO2: 17 mmol/L — ABNORMAL LOW (ref 22–32)
Calcium: 6.8 mg/dL — ABNORMAL LOW (ref 8.9–10.3)
Chloride: 99 mmol/L (ref 98–111)
Creatinine, Ser: 1.14 mg/dL (ref 0.61–1.24)
GFR calc Af Amer: >60 mL/min (ref >60)
GFR calc non Af Amer: >60 mL/min (ref >60)
Glucose, Bld: 84 mg/dL (ref 70–99)
Potassium: 3.5 mmol/L (ref 3.5–5.1)
Sodium: 129 mmol/L — ABNORMAL LOW (ref 135–145)

## 2018-02-09 LAB — HEMATOCRIT: HCT: 31.2 % — ABNORMAL LOW (ref 39.0–52.0)

## 2018-02-09 LAB — I-STAT CHEM 8, ED
BUN: 26 mg/dL — ABNORMAL HIGH (ref 6–20)
Calcium, Ion: 0.99 mmol/L — ABNORMAL LOW (ref 1.15–1.40)
Chloride: 93 mmol/L — ABNORMAL LOW (ref 98–111)
Creatinine, Ser: 1.3 mg/dL — ABNORMAL HIGH (ref 0.61–1.24)
Glucose, Bld: 98 mg/dL (ref 70–99)
HCT: 36 % — ABNORMAL LOW (ref 39.0–52.0)
Hemoglobin: 12.2 g/dL — ABNORMAL LOW (ref 13.0–17.0)
Potassium: 3.1 mmol/L — ABNORMAL LOW (ref 3.5–5.1)
Sodium: 124 mmol/L — ABNORMAL LOW (ref 135–145)
TCO2: 23 mmol/L (ref 22–32)

## 2018-02-09 LAB — I-STAT TROPONIN, ED: Troponin i, poc: 0.01 ng/mL (ref 0.00–0.08)

## 2018-02-09 LAB — I-STAT CG4 LACTIC ACID, ED
Lactic Acid, Venous: 1.74 mmol/L (ref 0.5–1.9)
Lactic Acid, Venous: 1.33 mmol/L (ref 0.5–1.9)

## 2018-02-09 LAB — RAPID HIV SCREEN (HIV 1/2 AB+AG)
HIV 1/2 Antibodies: NONREACTIVE
HIV-1 P24 Antigen - HIV24: NONREACTIVE

## 2018-02-09 LAB — C DIFFICILE QUICK SCREEN W PCR REFLEX
C Diff antigen: NEGATIVE
C Diff interpretation: NOT DETECTED
C Diff toxin: NEGATIVE

## 2018-02-09 LAB — INFLUENZA PANEL BY PCR (TYPE A & B)
Influenza A By PCR: NEGATIVE
Influenza B By PCR: NEGATIVE

## 2018-02-09 LAB — PROTIME-INR
INR: 1.14
Prothrombin Time: 14.5 seconds (ref 11.4–15.2)

## 2018-02-09 LAB — TROPONIN I: Troponin I: 0.05 ng/mL (ref <0.03)

## 2018-02-09 LAB — MAGNESIUM: Magnesium: 1.9 mg/dL (ref 1.7–2.4)

## 2018-02-09 LAB — POC OCCULT BLOOD, ED: Fecal Occult Bld: POSITIVE — AB

## 2018-02-09 LAB — PROCALCITONIN: Procalcitonin: 29.93 ng/mL

## 2018-02-09 LAB — ETHANOL: Alcohol, Ethyl (B): <10 mg/dL (ref <10)

## 2018-02-09 LAB — LIPASE, BLOOD: Lipase: 21 U/L (ref 11–51)

## 2018-02-09 LAB — HEMOGLOBIN: Hemoglobin: 10.3 g/dL — ABNORMAL LOW (ref 13.0–17.0)

## 2018-02-09 IMAGING — CT CT ABD-PELV W/ CM
2 of 5 series · 16 of 46 positions shown, 18 images · IV contrast (Omni 300)
Comparison: [DQ]

CLINICAL DATA: Fever and generalized body aches since Thanksgiving.
New onset diarrhea about a week ago.

EXAM:
CT ABDOMEN AND PELVIS WITH CONTRAST
TECHNIQUE: Multidetector CT imaging of the abdomen and pelvis was performed
using the standard protocol following bolus administration of
intravenous contrast.
CONTRAST:  100mL OMNIPAQUE IOHEXOL 300 MG/ML  SOLN

[Series 3: a/p w/ 5mm · axial · 0.82mm/px · z∈[+798,+1223]mm · 13 of 95 slices shown, 15 images]
[im 5/95  soft-tissue]
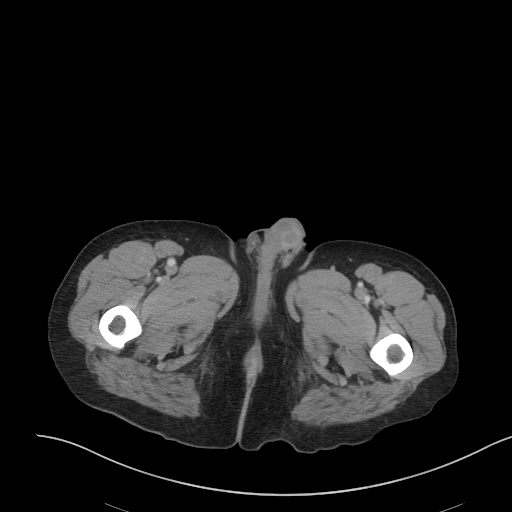
[im 5/95  bone]
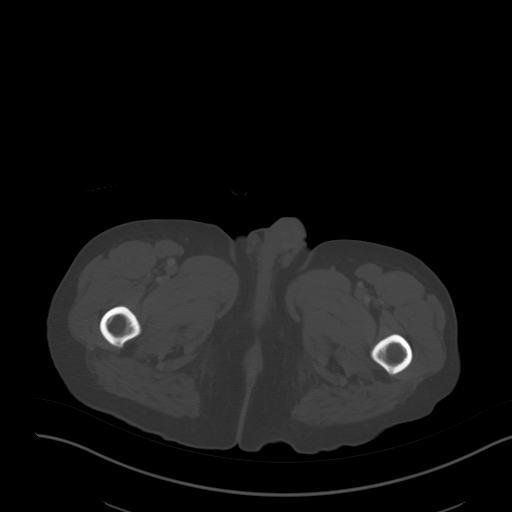
[im 15/95  soft-tissue]
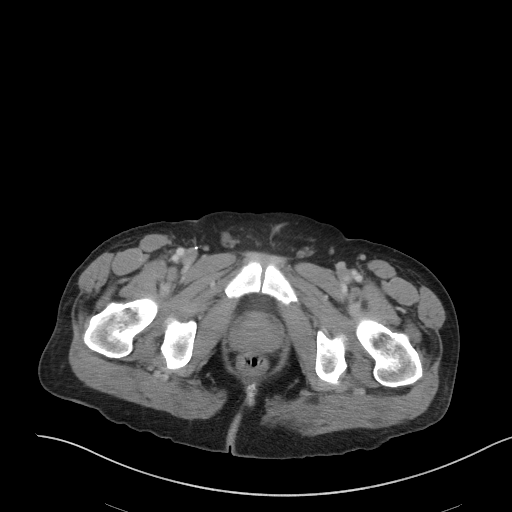
[im 19/95  soft-tissue]
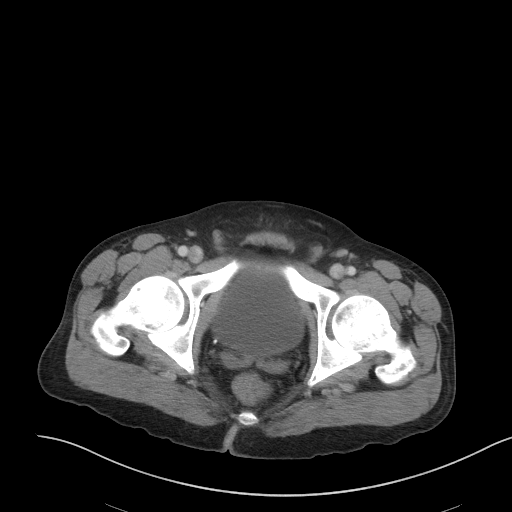
[im 29/95  soft-tissue]
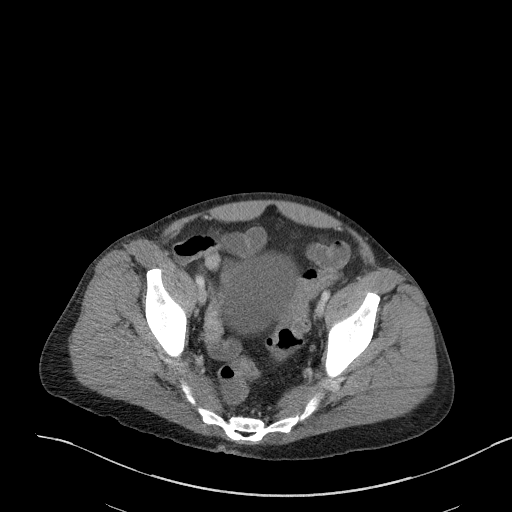
[im 33/95  soft-tissue]
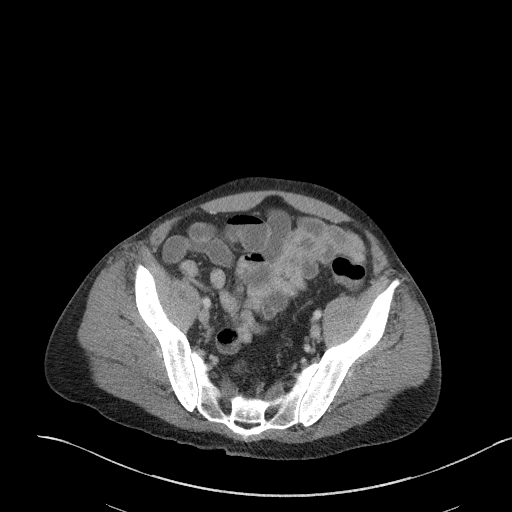
[im 43/95  soft-tissue]
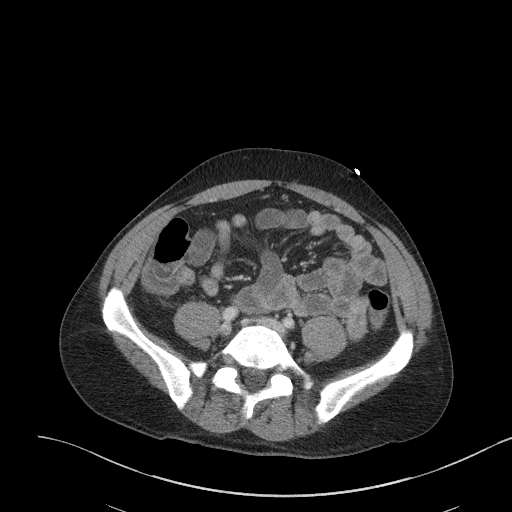
[im 48/95  soft-tissue]
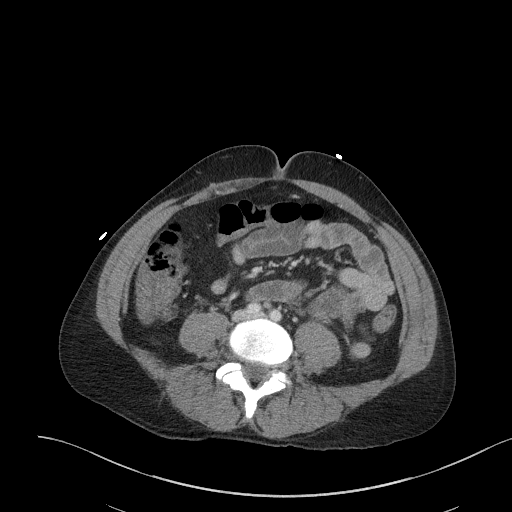
[im 52/95  soft-tissue]
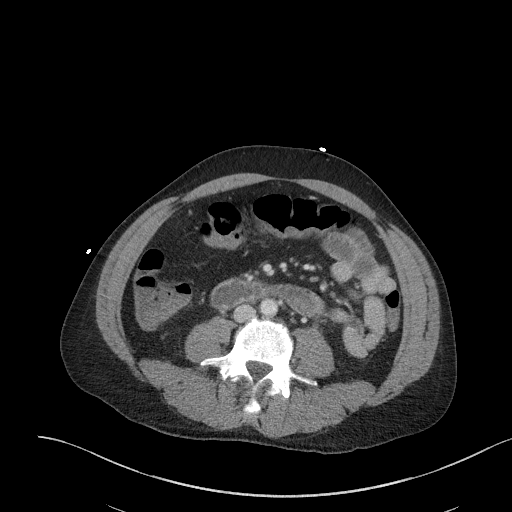
[im 62/95  soft-tissue]
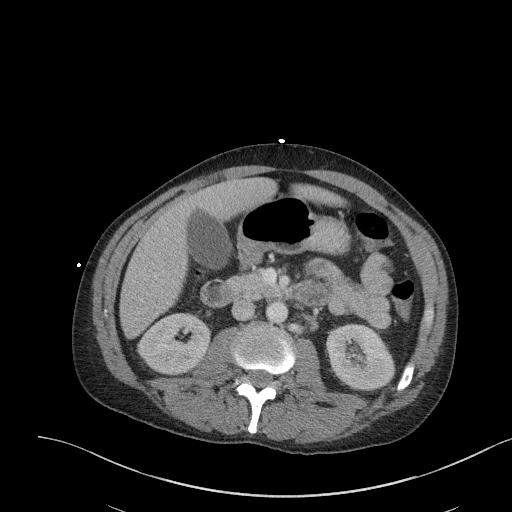
[im 62/95  bone]
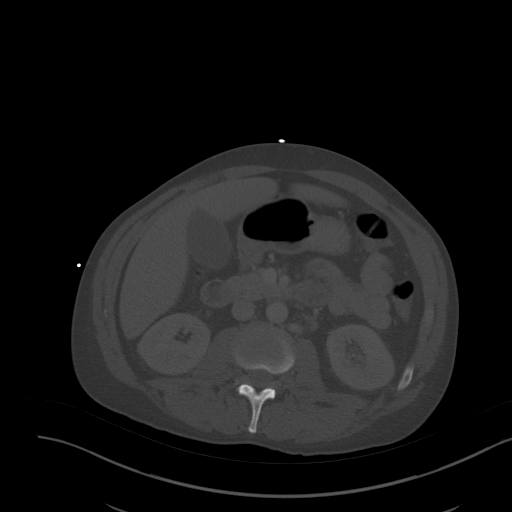
[im 66/95  soft-tissue]
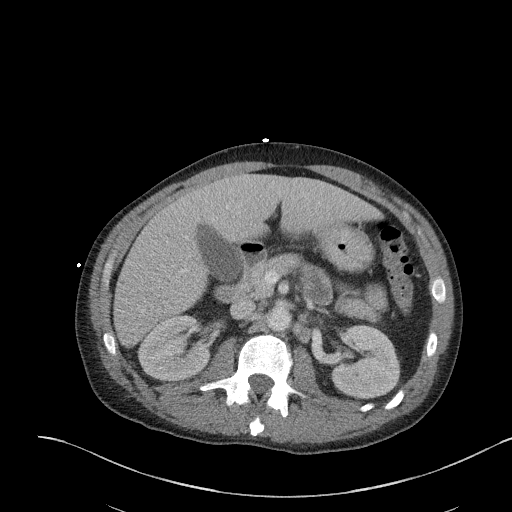
[im 76/95  soft-tissue]
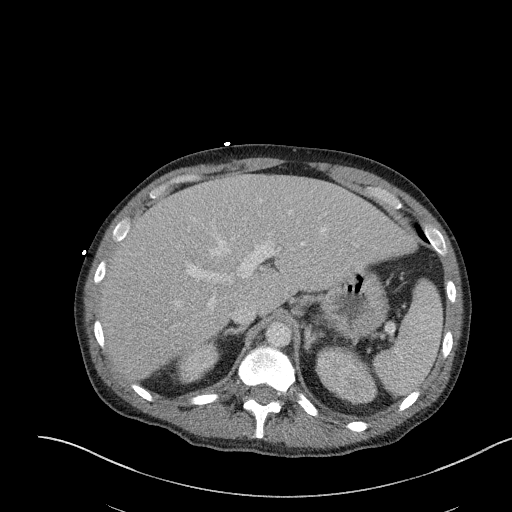
[im 80/95  soft-tissue]
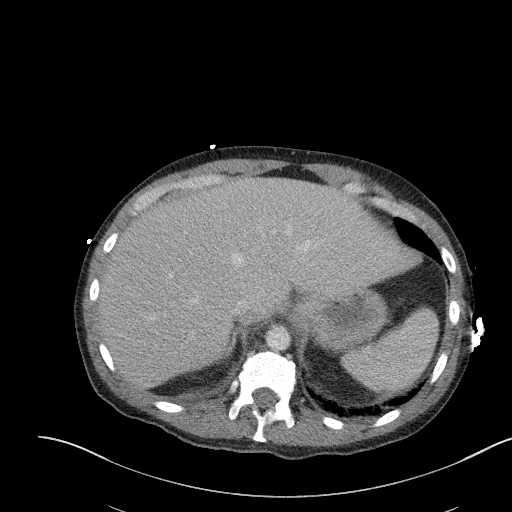
[im 90/95  soft-tissue]
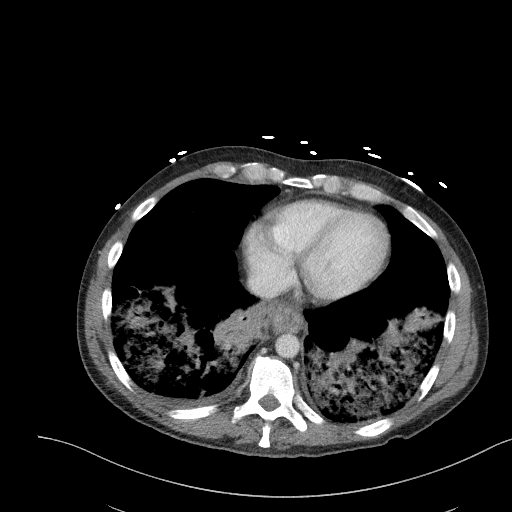

[Series 6: a/p w/ cor · coronal · 0.79mm/px · 3 of 141 slices shown]
[im 47/141  soft-tissue]
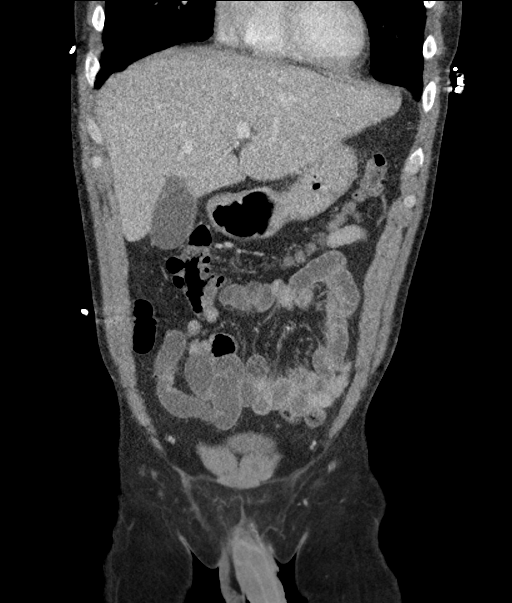
[im 63/141  soft-tissue]
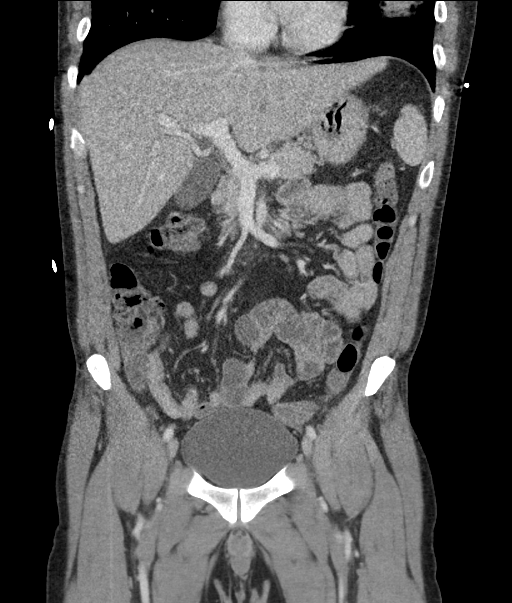
[im 78/141  soft-tissue]
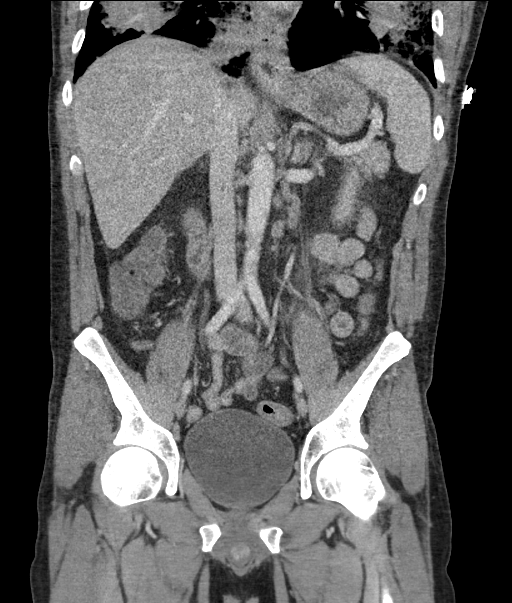

[16 of 46 positions shown; findings below may reference images not displayed]

FINDINGS: Lower chest: Confluent airspace opacity is identified in the lingula
and both lower lobes.

Hepatobiliary: No focal abnormality within the liver parenchyma.
There is no evidence for gallstones, gallbladder wall thickening, or
pericholecystic fluid. No intrahepatic or extrahepatic biliary
dilation.

Pancreas: No focal mass lesion. No dilatation of the main duct. No
intraparenchymal cyst. No peripancreatic edema.

Spleen: No splenomegaly. No focal mass lesion.

Adrenals/Urinary Tract: No adrenal nodule or mass. Kidneys
unremarkable. No evidence for hydroureter. The urinary bladder
appears normal for the degree of distention.

Stomach/Bowel: Small hiatal hernia. Stomach otherwise unremarkable.
Duodenum is normally positioned as is the ligament of Treitz. No
small bowel wall thickening. No small bowel dilatation. The terminal
ileum is normal. The appendix is normal. No gross colonic mass. No
colonic wall thickening.

Vascular/Lymphatic: No abdominal aortic aneurysm. No abdominal
aortic atherosclerotic calcification. There is no gastrohepatic or
hepatoduodenal ligament lymphadenopathy. No intraperitoneal or
retroperitoneal lymphadenopathy. No pelvic sidewall lymphadenopathy.

Reproductive: The prostate gland and seminal vesicles have normal
imaging features.

Other: No intraperitoneal free fluid.

Musculoskeletal: No worrisome lytic or sclerotic osseous
abnormality.
IMPRESSION: 1. Extensive airspace disease in the lung bases compatible with
pneumonia.
2. No acute findings in the abdomen or pelvis.

## 2018-02-09 IMAGING — DX DG CHEST 1V PORT
1 series · 1 of 1 positions shown · non-contrast
Comparison: [DATE]

CLINICAL DATA: Nausea/vomiting, body aches, fever, bloody diarrhea,
abdominal pain

EXAM:
PORTABLE CHEST 1 VIEW

[chest ap]
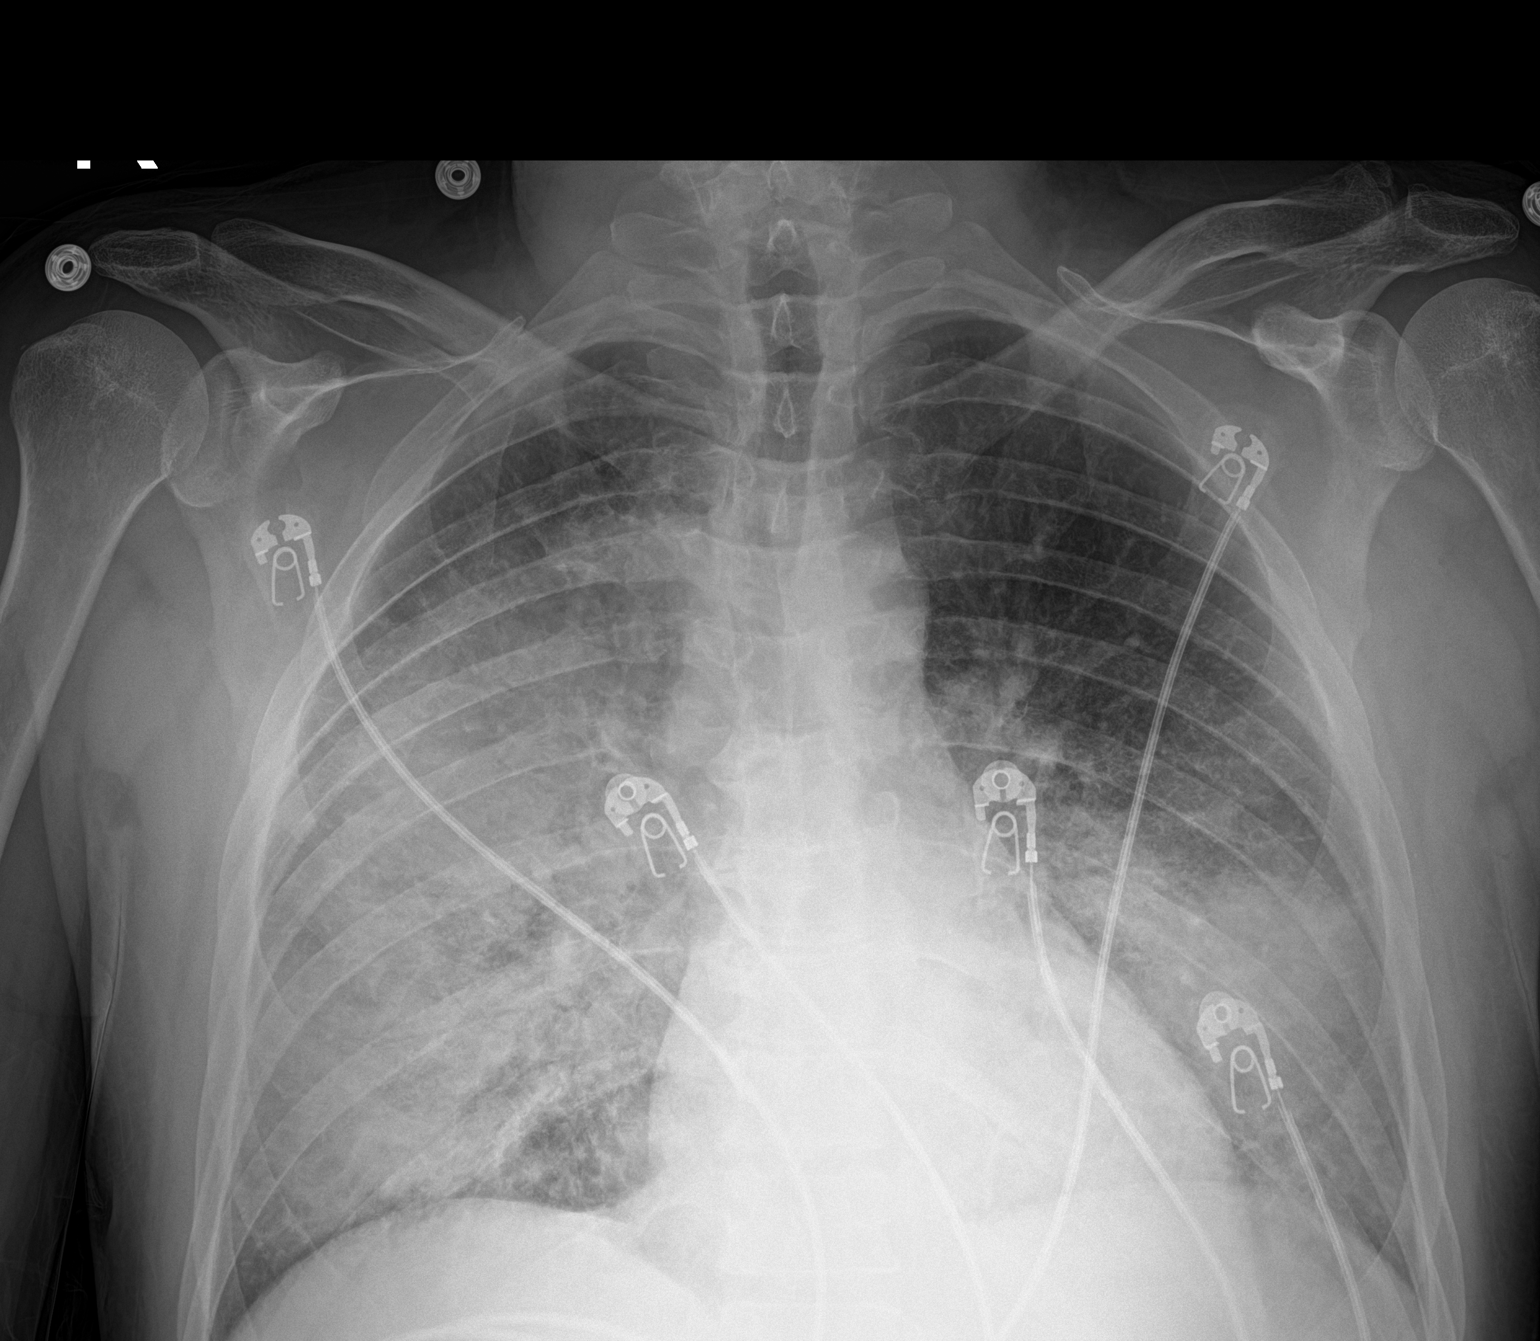

[1 of 1 positions shown; findings below may reference images not displayed]

FINDINGS: Multifocal patchy opacities in the right upper lobe, right lower
lobe, and left upper lobe/lingula. Despite the perihilar
distribution (raising the possibility of interstitial edema),
multifocal pneumonia is favored. There are no definite pleural
effusions. No pneumothorax.

The heart is normal in size.
IMPRESSION: Multifocal patchy opacities in the bilateral upper lobes and right
lower lobe, favoring multifocal pneumonia, less likely interstitial
edema.

No definite pleural effusions.

## 2018-02-09 IMAGING — DX DG ABD PORTABLE 1V
1 series · 1 of 1 positions shown · non-contrast
Comparison: None.

CLINICAL DATA: Nausea/vomiting, abdominal pain, diarrhea, fever

EXAM:
PORTABLE ABDOMEN - 1 VIEW

[abdomen kub]
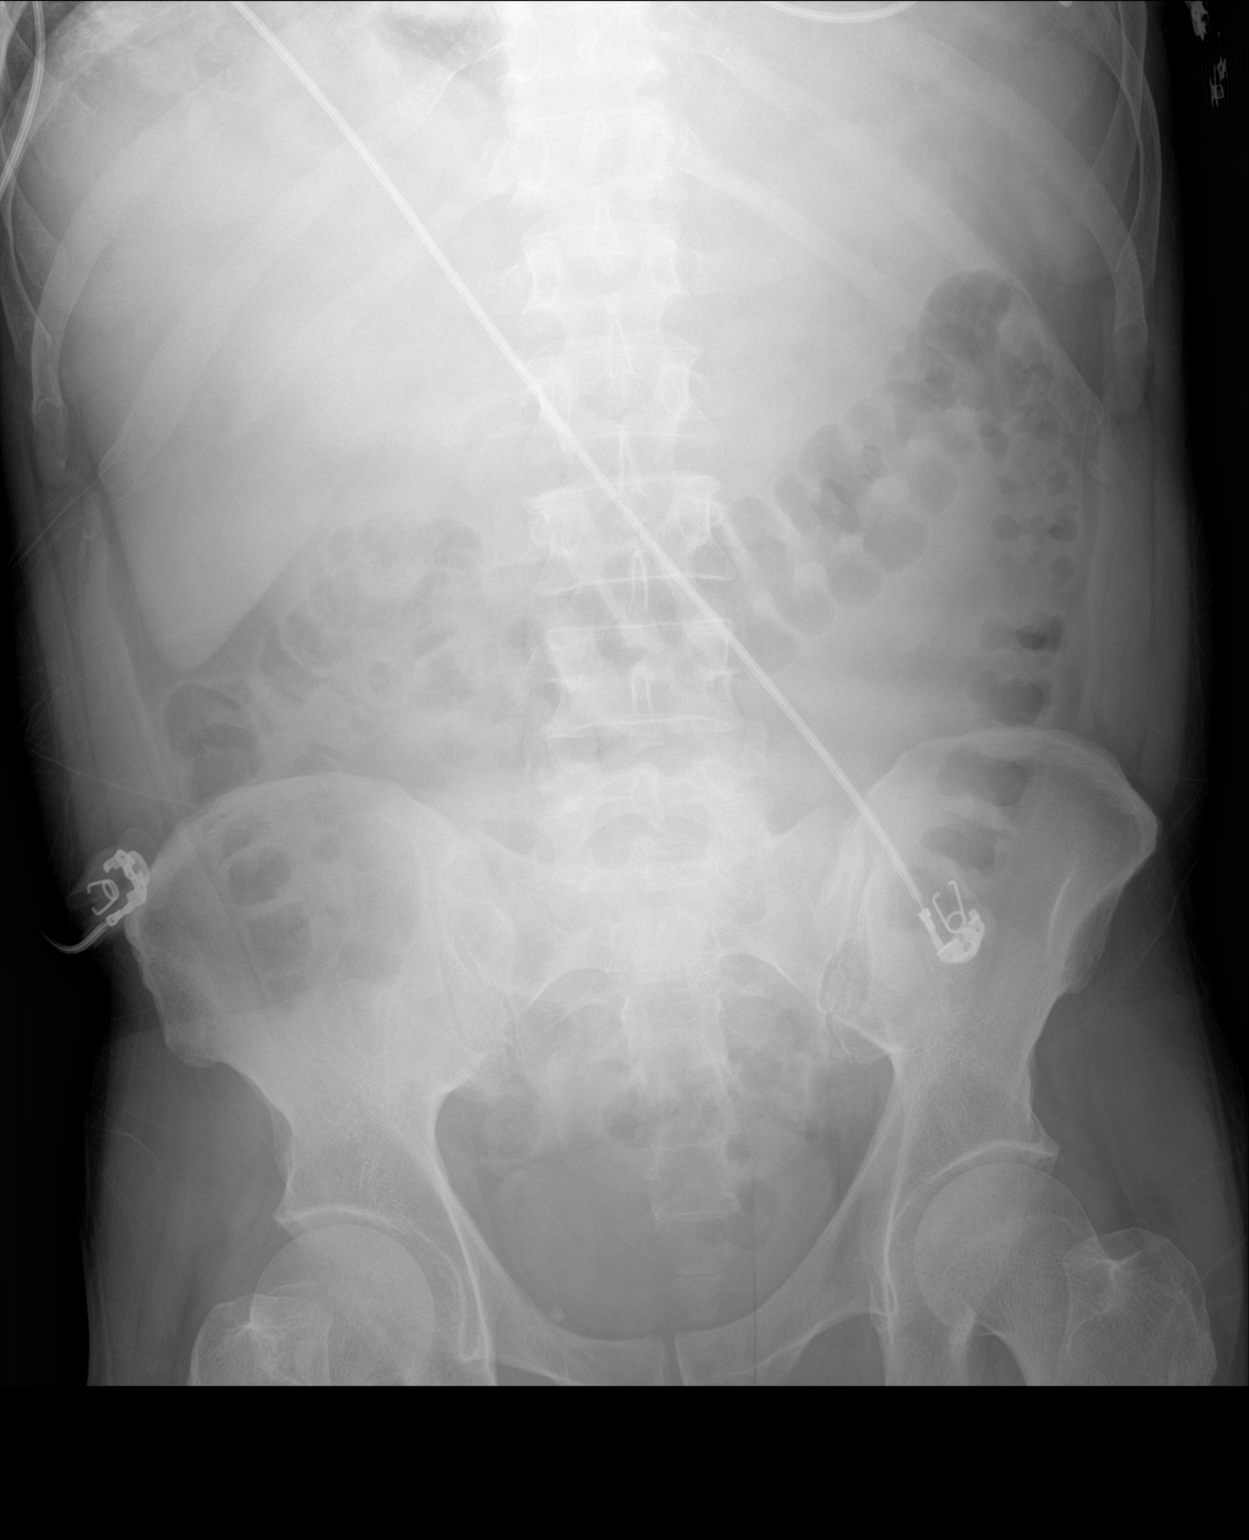

[1 of 1 positions shown; findings below may reference images not displayed]

FINDINGS: Nonobstructive bowel gas pattern.

Visualized osseous structures are within normal limits.
IMPRESSION: Unremarkable abdominal radiograph.

## 2018-02-09 MED ORDER — HYDROCODONE-ACETAMINOPHEN 5-325 MG PO TABS
1.0000 | ORAL_TABLET | ORAL | Status: DC | PRN
  Administered 2018-02-10: 1 via ORAL
  Administered 2018-02-10: 2 via ORAL
  Filled 2018-02-09: qty 2
  Filled 2018-02-09: qty 1

## 2018-02-09 MED ORDER — MORPHINE SULFATE (PF) 4 MG/ML IV SOLN
4.0000 mg | Freq: Once | INTRAVENOUS | Status: AC
  Administered 2018-02-09: 4 mg via INTRAVENOUS
  Filled 2018-02-09: qty 1

## 2018-02-09 MED ORDER — ONDANSETRON HCL 4 MG/2ML IJ SOLN
4.0000 mg | Freq: Once | INTRAMUSCULAR | Status: AC
  Administered 2018-02-09: 4 mg via INTRAVENOUS
  Filled 2018-02-09: qty 2

## 2018-02-09 MED ORDER — SODIUM CHLORIDE 0.9% FLUSH
3.0000 mL | Freq: Two times a day (BID) | INTRAVENOUS | Status: DC
  Administered 2018-02-10 – 2018-02-18 (×9): 3 mL via INTRAVENOUS

## 2018-02-09 MED ORDER — SODIUM CHLORIDE 0.9 % IV SOLN: 2.0000 g | Freq: Two times a day (BID) | INTRAVENOUS | Status: DC

## 2018-02-09 MED ORDER — POTASSIUM CHLORIDE 10 MEQ/100ML IV SOLN
10.0000 meq | INTRAVENOUS | Status: AC
  Administered 2018-02-09 (×4): 10 meq via INTRAVENOUS
  Filled 2018-02-09 (×4): qty 100

## 2018-02-09 MED ORDER — SODIUM CHLORIDE 0.9 % IV BOLUS
1000.0000 mL | Freq: Once | INTRAVENOUS | Status: AC
  Administered 2018-02-09: 1000 mL via INTRAVENOUS

## 2018-02-09 MED ORDER — SODIUM CHLORIDE 0.9 % IV SOLN
1.0000 g | INTRAVENOUS | Status: DC
  Administered 2018-02-10: 1 g via INTRAVENOUS
  Filled 2018-02-09: qty 10

## 2018-02-09 MED ORDER — VANCOMYCIN HCL 10 G IV SOLR: 1250.0000 mg | INTRAVENOUS | Status: DC

## 2018-02-09 MED ORDER — METRONIDAZOLE IN NACL 5-0.79 MG/ML-% IV SOLN
500.0000 mg | Freq: Three times a day (TID) | INTRAVENOUS | Status: DC
  Administered 2018-02-09 – 2018-02-10 (×2): 500 mg via INTRAVENOUS
  Filled 2018-02-09 (×2): qty 100

## 2018-02-09 MED ORDER — SODIUM CHLORIDE 0.9% FLUSH
3.0000 mL | Freq: Two times a day (BID) | INTRAVENOUS | Status: DC
  Administered 2018-02-10: 3 mL via INTRAVENOUS

## 2018-02-09 MED ORDER — SODIUM CHLORIDE 0.9% FLUSH: 3.0000 mL | INTRAVENOUS | Status: DC | PRN

## 2018-02-09 MED ORDER — SODIUM CHLORIDE 0.9 % IV SOLN
500.0000 mg | INTRAVENOUS | Status: DC
  Administered 2018-02-09 – 2018-02-11 (×3): 500 mg via INTRAVENOUS
  Filled 2018-02-09 (×3): qty 500

## 2018-02-09 MED ORDER — ONDANSETRON HCL 4 MG/2ML IJ SOLN
4.0000 mg | Freq: Four times a day (QID) | INTRAMUSCULAR | Status: DC | PRN
  Administered 2018-02-10 – 2018-02-25 (×2): 4 mg via INTRAVENOUS
  Filled 2018-02-09 (×2): qty 2

## 2018-02-09 MED ORDER — ACETAMINOPHEN 650 MG RE SUPP
650.0000 mg | Freq: Once | RECTAL | Status: AC
  Administered 2018-02-09: 650 mg via RECTAL
  Filled 2018-02-09: qty 1

## 2018-02-09 MED ORDER — ACETAMINOPHEN 325 MG PO TABS
650.0000 mg | ORAL_TABLET | Freq: Four times a day (QID) | ORAL | Status: DC | PRN
  Administered 2018-02-10: 650 mg via ORAL
  Filled 2018-02-09 (×2): qty 2

## 2018-02-09 MED ORDER — IOHEXOL 300 MG/ML  SOLN
100.0000 mL | Freq: Once | INTRAMUSCULAR | Status: AC | PRN
  Administered 2018-02-09: 100 mL via INTRAVENOUS

## 2018-02-09 MED ORDER — ONDANSETRON HCL 4 MG PO TABS: 4.0000 mg | ORAL_TABLET | Freq: Four times a day (QID) | ORAL | Status: DC | PRN

## 2018-02-09 MED ORDER — ACETAMINOPHEN 650 MG RE SUPP: 650.0000 mg | Freq: Four times a day (QID) | RECTAL | Status: DC | PRN

## 2018-02-09 MED ORDER — SODIUM CHLORIDE 0.9 % IV SOLN
Freq: Once | INTRAVENOUS | Status: AC
  Administered 2018-02-09: 20:00:00 via INTRAVENOUS

## 2018-02-09 MED ORDER — VANCOMYCIN HCL IN DEXTROSE 1-5 GM/200ML-% IV SOLN: 1000.0000 mg | Freq: Once | INTRAVENOUS | Status: DC

## 2018-02-09 MED ORDER — SODIUM CHLORIDE 0.9 % IV SOLN
2.0000 g | Freq: Once | INTRAVENOUS | Status: AC
  Administered 2018-02-09: 2 g via INTRAVENOUS
  Filled 2018-02-09: qty 2

## 2018-02-09 MED ORDER — PANTOPRAZOLE SODIUM 40 MG IV SOLR
40.0000 mg | Freq: Two times a day (BID) | INTRAVENOUS | Status: DC
  Administered 2018-02-09 – 2018-02-23 (×29): 40 mg via INTRAVENOUS
  Filled 2018-02-09 (×30): qty 40

## 2018-02-09 MED ORDER — VANCOMYCIN HCL 10 G IV SOLR
1250.0000 mg | Freq: Once | INTRAVENOUS | Status: AC
  Administered 2018-02-09: 1250 mg via INTRAVENOUS
  Filled 2018-02-09: qty 1250

## 2018-02-09 MED ORDER — SODIUM CHLORIDE 0.9 % IV SOLN: 250.0000 mL | INTRAVENOUS | Status: DC | PRN

## 2018-02-09 NOTE — ED Notes (Signed)
CXR at bedside

## 2018-02-09 NOTE — ED Notes (Signed)
Patient transported to CT 

## 2018-02-09 NOTE — ED Triage Notes (Signed)
Pt presents to ED for evaluation of fever, and generalized body aches since Thanksgiving. Pt endorses new onset of diarrhea about a week ago, pt states new onset of bright red blood in stool yesterday. Pt c/o 9/10 back pain. Pt initial O2 87%, 92% on 4L.
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
## 2018-02-09 NOTE — Progress Notes (Signed)
Pharmacy Antibiotic Note  Roberto Knapp is a 47 y.o. male admitted on 02/09/2018 with unkown source of infection.  Pharmacy has been consulted for cefepime and vancomycin dosing. WBC 18.8. LA 1.74. SCr 1.3. Tm 103F  Plan: -Vancomycin 1250 mg IV Q 24 hours  -Cefepime 2 gm IV Q 12 hours -Monitor CBC, renal fx, cultures and clinical progress -Vanc levels as necessary   Height: 5\' 8"  (172.7 cm) Weight: 145 lb (65.8 kg) IBW/kg (Calculated) : 68.4  Temp (24hrs), Avg:103 F (39.4 C), Min:103 F (39.4 C), Max:103 F (39.4 C)  No results for input(s): WBC, CREATININE, LATICACIDVEN, VANCOTROUGH, VANCOPEAK, VANCORANDOM, GENTTROUGH, GENTPEAK, GENTRANDOM, TOBRATROUGH, TOBRAPEAK, TOBRARND, AMIKACINPEAK, AMIKACINTROU, AMIKACIN in the last 168 hours.  CrCl cannot be calculated (Patient's most recent lab result is older than the maximum 21 days allowed.).    No Known Allergies    Thank you for allowing pharmacy to be a part of this patient's care.  Vinnie LevelBenjamin Edwinna Rochette, PharmD., BCPS Clinical Pharmacist Clinical phone for 02/09/18 until 9pm: [phone]

## 2018-02-09 NOTE — H&P (Signed)
History and Physical    Roberto Knapp WUJ:[id number] DOB: Apr 04, 1970 DOA: 02/09/2018  PCP: [name], MD   Patient coming from: Home   Chief Complaint: Fever, aches, SOB, abdominal pain, N/V, melena, BRBPR  HPI: Roberto Knapp is a 47 y.o. male who denies any significant past medical history, now presenting to the emergency department with approximately 10 days of fevers, generalized aches, abdominal pain, nausea, vomiting, diarrhea, cough, and shortness of breath.  Symptoms began with fevers, generalized aches, abdominal discomfort, and nausea with nonbloody vomiting, and diarrhea, but have progressed to include worsening shortness of breath, productive cough, melena, and then bright red blood per rectum.  Patient had been taking Advil every 4 hours for his symptoms, developed melena after few days, but reports more recent bright red blood in his stool.  Abdominal pain is mainly epigastric.  Denies chest pain.  Denies leg swelling or tenderness.  No headache, change in vision or hearing, or focal numbness or weakness.  He has never experienced these symptoms previously.  No recent travel or sick contacts.  Reports drinking 2-3 beers daily, denies illicit drug use, and denies ever experiencing withdrawal symptoms.  ED Course: Upon arrival to the ED, patient is found to be febrile to 39.4 C, saturating 97% on room air, tachypneic in the upper 30s, tachycardic to 130, and blood pressure 96/63.  EKG features sinus tachycardia with rate 123 and nonspecific IVCD.  Chest x-ray is concerning for multifocal patchy opacities in the bilateral upper lobes and right lower lobe consistent with multifocal pneumonia.  CT the abdomen and pelvis is negative for acute intra-abdominal or pelvic abnormalities, but pneumonia is noted in the lung bases.  Chemistry panel features a sodium of 121, potassium 3.0, BUN of 23, and creatinine 1.20.  Also noted his albumin of 1.7 and mild elevation in transaminases with AST more than  double ALT.  CBC features a leukocytosis to 18,900 and normocytic anemia with hemoglobin 11.6.  Influenza PCR is negative, urinalysis unremarkable, and fecal occult blood testing positive.  Lactic acid is reassuringly normal, INR is normal, troponin normal, and UDS positive for THC.  Blood and urine cultures were collected, type and screen performed, patient was given 3 L of normal saline, 40 mEq IV potassium, morphine, acetaminophen, and treated with vancomycin, cefepime, and Flagyl.  Blood pressure has improved with fluids but he remains tachycardic in the 120s.  He is requiring 4 L/min of supplemental oxygen to maintain saturation in the low 90s.  To be admitted to the stepdown unit for ongoing evaluation and management of sepsis secondary to multifocal pneumonia, complicated by nausea, vomiting, and diarrhea with GI bleeding.  Review of Systems:  All other systems reviewed and apart from HPI, are negative.  Past Medical History:  Diagnosis Date  . Drug overdose, intentional [facility])     Past Surgical History:  Procedure Laterality Date  . FASCIOTOMY  09/08/2011   Procedure: FASCIOTOMY;  Surgeon: Sherren Kerns, MD;  Location: [facility] OR;  Service: Vascular;  Laterality: Left;  . FEMORAL-POPLITEAL BYPASS GRAFT  09/08/2011   Procedure: BYPASS GRAFT FEMORAL-POPLITEAL ARTERY;  Surgeon: Sherren Kerns, MD;  Location: [facility] OR;  Service: Vascular;  Laterality: Left;  . NO PAST SURGERIES       reports that he has been smoking cigarettes. He has a 12.50 pack-year smoking history. He has never used smokeless tobacco. He reports that he drinks alcohol. He reports that he does not use drugs.  No Known Allergies  Family History  Problem Relation Age of Onset  . Hypertension Father      Prior to Admission medications   Medication Sig Start Date End Date Taking? Authorizing Provider  Acetaminophen (TYLENOL PO) Take 2 tablets by mouth every 6 (six) hours as needed (pain/fever/headache).   Yes [provider]  ibuprofen (ADVIL,MOTRIN) 200 MG tablet Take 400 mg by mouth every 6 (six) hours as needed for fever, headache, mild pain, moderate pain or cramping.   Yes [provider]  lisdexamfetamine (VYVANSE) 50 MG capsule Take 50 mg by mouth daily.   Yes [provider]    Physical Exam: Vitals:   02/09/18 1845 02/09/18 1900 02/09/18 1930 02/09/18 1945  BP: 96/63 (!) 111/57 132/62 117/75  Pulse: (!) 126 (!) 129 (!) 129 (!) 127  Resp: (!) 33 (!) 33 (!) 25 (!) 21  Temp:      TempSrc:      SpO2: 93% 94% 93% 90%  Weight:      Height:        Constitutional: Tachypneic, appears uncomfortable, no pallor, no diaphoresis  Eyes: PERTLA, lids and conjunctivae normal ENMT: Mucous membranes are moist. Posterior pharynx clear of any exudate or lesions.   Neck: normal, supple, no masses, no thyromegaly Respiratory: Tachypnea. Speaking full sentences. Rhonchi bilaterally. No accessory muscle use.  Cardiovascular: Rate ~120 and regular. No extremity edema.   Abdomen: No distension, soft, generalized tenderness without guarding or rebound tenderness. Bowel sounds active.  Musculoskeletal: no clubbing / cyanosis. No joint deformity upper and lower extremities.    Skin: no significant rashes, lesions, ulcers. No pallor. Poor turgor. Neurologic: No facial asymmetry. Sensation intact. Moving all extremities.  Psychiatric: Alert and oriented x 3. Calm. Cooperative.    Labs on Admission: I have personally reviewed following labs and imaging studies  CBC: Recent Labs  Lab 02/09/18 1725 02/09/18 1757  WBC 18.8*  --   NEUTROABS 17.7*  --   HGB 11.6* 12.2*  HCT 33.7* 36.0*  MCV 88.5  --   PLT 217  --    Basic Metabolic Panel: Recent Labs  Lab 02/09/18 1725 02/09/18 1757  NA 121* 124*  K 3.0* 3.1*  CL 92* 93*  CO2 19*  --   GLUCOSE 105* 98  BUN 23* 26*  CREATININE 1.20 1.30*  CALCIUM 7.4*  --   MG 1.9  --    GFR: Estimated Creatinine Clearance: 64 mL/min (A)  (by C-G formula based on SCr of 1.3 mg/dL (H)). Liver Function Tests: Recent Labs  Lab 02/09/18 1725  AST 136*  ALT 57*  ALKPHOS 90  BILITOT 1.2  PROT 5.6*  ALBUMIN 1.7*   Recent Labs  Lab 02/09/18 1725  LIPASE 21   No results for input(s): AMMONIA in the last 168 hours. Coagulation Profile: Recent Labs  Lab 02/09/18 1725  INR 1.14   Cardiac Enzymes: No results for input(s): CKTOTAL, CKMB, CKMBINDEX, TROPONINI in the last 168 hours. BNP (last 3 results) No results for input(s): PROBNP in the last 8760 hours. HbA1C: No results for input(s): HGBA1C in the last 72 hours. CBG: No results for input(s): GLUCAP in the last 168 hours. Lipid Profile: No results for input(s): CHOL, HDL, LDLCALC, TRIG, CHOLHDL, LDLDIRECT in the last 72 hours. Thyroid Function Tests: No results for input(s): TSH, T4TOTAL, FREET4, T3FREE, THYROIDAB in the last 72 hours. Anemia Panel: No results for input(s): VITAMINB12, FOLATE, FERRITIN, TIBC, IRON, RETICCTPCT in the last 72 hours. Urine analysis:  Component Value Date/Time   COLORURINE AMBER (A) 02/09/2018 1816   APPEARANCEUR CLEAR 02/09/2018 1816   LABSPEC 1.019 02/09/2018 1816   PHURINE 6.0 02/09/2018 1816   GLUCOSEU NEGATIVE 02/09/2018 1816   HGBUR SMALL (A) 02/09/2018 1816   BILIRUBINUR NEGATIVE 02/09/2018 1816   KETONESUR NEGATIVE 02/09/2018 1816   PROTEINUR 100 (A) 02/09/2018 1816   UROBILINOGEN 1.0 10/21/2014 0951   NITRITE NEGATIVE 02/09/2018 1816   LEUKOCYTESUR NEGATIVE 02/09/2018 1816   Sepsis Labs: @LABRCNTIP (procalcitonin:4,lacticidven:4) ) Recent Results (from the past 240 hour(s))  C difficile quick scan w PCR reflex     Status: None   Collection Time: 02/09/18  6:36 PM  Result Value Ref Range Status   C Diff antigen NEGATIVE NEGATIVE Final   C Diff toxin NEGATIVE NEGATIVE Final   C Diff interpretation No C. difficile detected.  Final    Comment: Performed at [facility], [address] [place], [place]  [address]     Radiological Exams on Admission: Ct Abdomen Pelvis W Contrast  Result Date: 02/09/2018 CLINICAL DATA:  Fever and generalized body aches since Thanksgiving. New onset diarrhea about a week ago. EXAM: CT ABDOMEN AND PELVIS WITH CONTRAST TECHNIQUE: Multidetector CT imaging of the abdomen and pelvis was performed using the standard protocol following bolus administration of intravenous contrast. CONTRAST:  098Museum/gallery e ib098Museum/gallery exhibitions re098Museum/gallery exhibitioUva Kluge hi098Museum/gallery exhibitions Co<MEASUREMENT n0098Museum/gallery exhibitio S098Museum/gallery xh098Museum/gallery e ib098Museum/gallery exhibitions BNorthw te098Museu<MEAS EM098Museum/galle<MEASURE NT098Museum/g le098Museum/gallery exhibitiSouth [facility] CTi([address] [facility] HospitalRGames develoTenne1234(Nada pacity is identified in the lingula and both lower lobes. Hepatobiliary: No focal abnormality within the liver parenchyma. There is no evidence for gallstones, gallbladder wall thickening, or pericholecystic fluid. No intrahepatic or extrahepatic biliary dilation. Pancreas: No focal mass lesion. No dilatation of the main duct. No intraparenchymal cyst. No peripancreatic edema. Spleen: No splenomegaly. No focal mass lesion. Adrenals/Urinary Tract: No adrenal nodule or mass. Kidneys unremarkable. No evidence for hydroureter. The urinary bladder appears normal for the degree of distention. Stomach/Bowel: Small hiatal hernia. Stomach otherwise unremarkable. Duodenum is normally positioned as is the ligament of Treitz. No small bowel wall thickening. No small bowel dilatation. The terminal ileum is normal. The appendix is normal. No gross colonic mass. No colonic wall thickening. Vascular/Lymphatic: No abdominal aortic aneurysm. No abdominal aortic atherosclerotic calcification. There is no gastrohepatic or hepatoduodenal ligament lymphadenopathy. No intraperitoneal or retroperitoneal lymphadenopathy. No pelvic sidewall lymphadenopathy. Reproductive: The prostate gland and seminal vesicles have normal imaging features. Other: No intraperitoneal free fluid. Musculoskeletal: No worrisome lytic or sclerotic osseous abnormality. IMPRESSION: 1. Extensive airspace disease in the lung bases compatible with  pneumonia. 2. No acute findings in the abdomen or pelvis. Electronically Signed   By: Eric  Mansell M.D.   On: 02/09/2018 19:44   Dg Chest Port 1 View  Result Date: 02/09/2018 CLINICAL DATA:  Nausea/vomiting, body aches, fever, bloody diarrhea, abdominal pain EXAM: PORTABLE CHEST 1 VIEW COMPARISON:  07/10/2011 FINDINGS: Multifocal patchy opacities in the right upper lobe, right lower lobe, and left upper lobe/lingula. Despite the perihilar distribution (raising the possibility of interstitial edema), multifocal pneumonia is favored. There are no definite pleural effusions. No pneumothorax. The heart is normal in size. IMPRESSION: Multifocal patchy opacities in the bilateral upper lobes and right lower lobe, favoring multifocal pneumonia, less likely interstitial edema. No definite pleural effusions. Electronically Signed   By: Sriyesh  Krishnan M.D.   On: 02/09/2018 19:17   Dg Abd Portable 1 View  Result Date: 02/09/2018 CLINICAL DATA:  Nausea/vomiting, abdominal pain, diarrhea, fever EXAM: PORTABLE ABDOMEN - 1 VIEW COMPARISON:  None. FINDINGS: Nonobstructive bowel gas  pattern. Visualized osseous structures are within normal limits. IMPRESSION: Unremarkable abdominal radiograph. Electronically Signed   By: Charline Bills M.D.   On: 02/09/2018 19:18    EKG: Independently reviewed. Sinus tachycardia (rate 123), non-specific IVCD. Minimal ST-elevations inferiorly.   Assessment/Plan   1. Sepsis secondary to multifocal PNA  - Presents with 10 days of fevers, aches, N/V/D, and SOB with productive cough  - Found to be febrile, tachycardic, tachypneic, and hypoxic with resting O2 sat of 87%  - Influenza PCR negative  - Cultures collected in ED, 30 cc/kg + 1 L NS given, and empiric antibiotics initiated with vancomycin, cefepime, and Flagyl  - Add sputum culture, check strep pneumo antigen, check/trend procalcitonin, and continue antibiotics with vancomycin, Rocephin, azithromycin, and Flagyl for CAP  with critical-illness and bloody diarrhea    2. GI bleeding  - Reports epigastric pain and melena after taking Advil 400 mg q4h for fevers and aches, and now reports more recent BRBPR  - FOBT is positive, no acute findings on CT abd/pelvis, BUN is 23 with SCr of 1.2, Hgb 11.6 initially  - Type and screen and fluid-resuscitation performed in ED  - Start IV PPI q12h, follow H&H    3. Hyponatremia  - Serum sodium is 121 in setting of marked hypovolemia from 10 days N/V/D  - He received 3 liters NS in ED  - Repeat chem panel now, adjust fluids as needed, and follow serial chemistries to avoid too rapid a correction    4. Hypokalemia  - Serum potassium is 3.0 in ED, secondary to GI-losses  - Receiving 40 mEq IV potassium in ED  - Continue cardiac monitoring, following serial chem panels as above    5. Abnormal LFT's  - Albumin is only 1.7 with mild transaminase elevations, AST more than 2x ALT - Abdomen generally tender but soft and without peritoneal signs; no acute findings on CT abd/pelvis  - Possibly related to EtOH use given pattern of elevations, though he denies excessive use  - Continue supportive care, treat sepsis, trend LFT's    6. Diarrhea  - Abdominal exam is benign and no acute intraabdominal findings on CT  - Possibly infectious, will continue Flagyl in addition to CAP coverage, monitor and replete lytes, continue IVF hydration, continue enteric precautions      DVT prophylaxis: SCD's  Code Status: Full  Family Communication: Significant other updated at bedside Consults called: None Admission status: Inpatient     Briscoe Deutscher, MD [facility] Pager [phone]  If 7PM-7AM, please contact night-coverage [email or web address] Password [facility]  02/09/2018, 8:34 PM

## 2018-02-10 ENCOUNTER — Inpatient Hospital Stay (HOSPITAL_COMMUNITY): Payer: BLUE CROSS/BLUE SHIELD

## 2018-02-10 DIAGNOSIS — J9601 Acute respiratory failure with hypoxia: Secondary | ICD-10-CM

## 2018-02-10 DIAGNOSIS — R6521 Severe sepsis with septic shock: Secondary | ICD-10-CM

## 2018-02-10 DIAGNOSIS — J189 Pneumonia, unspecified organism: Secondary | ICD-10-CM

## 2018-02-10 DIAGNOSIS — I4891 Unspecified atrial fibrillation: Secondary | ICD-10-CM

## 2018-02-10 DIAGNOSIS — A419 Sepsis, unspecified organism: Secondary | ICD-10-CM

## 2018-02-10 DIAGNOSIS — R0902 Hypoxemia: Secondary | ICD-10-CM

## 2018-02-10 LAB — GASTROINTESTINAL PANEL BY PCR, STOOL (REPLACES STOOL CULTURE)

## 2018-02-10 LAB — HEPATIC FUNCTION PANEL
ALT: 46 U/L — ABNORMAL HIGH (ref 0–44)
AST: 115 U/L — ABNORMAL HIGH (ref 15–41)
Albumin: 1.5 g/dL — ABNORMAL LOW (ref 3.5–5.0)
Alkaline Phosphatase: 79 U/L (ref 38–126)
Bilirubin, Direct: 0.5 mg/dL — ABNORMAL HIGH (ref 0.0–0.2)
Indirect Bilirubin: 0.5 mg/dL (ref 0.3–0.9)
Total Bilirubin: 1 mg/dL (ref 0.3–1.2)
Total Protein: 5.1 g/dL — ABNORMAL LOW (ref 6.5–8.1)

## 2018-02-10 LAB — LACTIC ACID, PLASMA
Lactic Acid, Venous: 1.8 mmol/L (ref 0.5–1.9)
Lactic Acid, Venous: 1.5 mmol/L (ref 0.5–1.9)

## 2018-02-10 LAB — PROCALCITONIN: Procalcitonin: 31.41 ng/mL

## 2018-02-10 LAB — POCT I-STAT 3, ART BLOOD GAS (G3+)
Acid-base deficit: 6 mmol/L — ABNORMAL HIGH (ref 0.0–2.0)
Bicarbonate: 19.7 mmol/L — ABNORMAL LOW (ref 20.0–28.0)
O2 Saturation: 90 %
TCO2: 21 mmol/L — ABNORMAL LOW (ref 22–32)
pCO2 arterial: 39.9 mmHg (ref 32.0–48.0)
pH, Arterial: 7.301 — ABNORMAL LOW (ref 7.350–7.450)
pO2, Arterial: 64 mmHg — ABNORMAL LOW (ref 83.0–108.0)

## 2018-02-10 LAB — BASIC METABOLIC PANEL
Anion gap: 10 (ref 5–15)
BUN: 17 mg/dL (ref 6–20)
CO2: 20 mmol/L — ABNORMAL LOW (ref 22–32)
Calcium: 7.2 mg/dL — ABNORMAL LOW (ref 8.9–10.3)
Chloride: 101 mmol/L (ref 98–111)
Creatinine, Ser: 1.22 mg/dL (ref 0.61–1.24)
GFR calc Af Amer: >60 mL/min (ref >60)
GFR calc non Af Amer: >60 mL/min (ref >60)
Glucose, Bld: 122 mg/dL — ABNORMAL HIGH (ref 70–99)
Potassium: 3.4 mmol/L — ABNORMAL LOW (ref 3.5–5.1)
Sodium: 131 mmol/L — ABNORMAL LOW (ref 135–145)

## 2018-02-10 LAB — HEMOGLOBIN AND HEMATOCRIT, BLOOD
HCT: 33 % — ABNORMAL LOW (ref 39.0–52.0)
HCT: 33.3 % — ABNORMAL LOW (ref 39.0–52.0)
Hemoglobin: 11.1 g/dL — ABNORMAL LOW (ref 13.0–17.0)
Hemoglobin: 10.5 g/dL — ABNORMAL LOW (ref 13.0–17.0)

## 2018-02-10 LAB — URINE CULTURE: Culture: NO GROWTH

## 2018-02-10 LAB — BLOOD GAS, ARTERIAL
Acid-base deficit: 5.2 mmol/L — ABNORMAL HIGH (ref 0.0–2.0)
Bicarbonate: 19.7 mmol/L — ABNORMAL LOW (ref 20.0–28.0)
Drawn by: 511471
FIO2: 100
O2 Content: 15 L/min
O2 Saturation: 95.8 %
Patient temperature: 98.6
pCO2 arterial: 38.4 mmHg (ref 32.0–48.0)
pH, Arterial: 7.33 — ABNORMAL LOW (ref 7.350–7.450)
pO2, Arterial: 80.2 mmHg — ABNORMAL LOW (ref 83.0–108.0)

## 2018-02-10 LAB — GLUCOSE, CAPILLARY
Glucose-Capillary: 93 mg/dL (ref 70–99)
Glucose-Capillary: 110 mg/dL — ABNORMAL HIGH (ref 70–99)
Glucose-Capillary: 97 mg/dL (ref 70–99)
Glucose-Capillary: 105 mg/dL — ABNORMAL HIGH (ref 70–99)
Glucose-Capillary: 87 mg/dL (ref 70–99)

## 2018-02-10 LAB — CBC
HCT: 31.2 % — ABNORMAL LOW (ref 39.0–52.0)
Hemoglobin: 10.6 g/dL — ABNORMAL LOW (ref 13.0–17.0)
MCH: 30.5 pg (ref 26.0–34.0)
MCHC: 34 g/dL (ref 30.0–36.0)
MCV: 89.9 fL (ref 80.0–100.0)
Platelets: 149 10*3/uL — ABNORMAL LOW (ref 150–400)
RBC: 3.47 MIL/uL — ABNORMAL LOW (ref 4.22–5.81)
RDW: 14 % (ref 11.5–15.5)
WBC: 18.2 10*3/uL — ABNORMAL HIGH (ref 4.0–10.5)
nRBC: 0 % (ref 0.0–0.2)

## 2018-02-10 LAB — MRSA PCR SCREENING: MRSA by PCR: NEGATIVE

## 2018-02-10 LAB — TSH: TSH: 1.128 u[IU]/mL (ref 0.350–4.500)

## 2018-02-10 LAB — STREP PNEUMONIAE URINARY ANTIGEN: Strep Pneumo Urinary Antigen: NEGATIVE

## 2018-02-10 LAB — TRIGLYCERIDES: Triglycerides: 144 mg/dL (ref <150)

## 2018-02-10 LAB — ABO/RH: ABO/RH(D): A POS

## 2018-02-10 IMAGING — DX DG CHEST 1V PORT
1 series · 1 of 1 positions shown · non-contrast
Comparison: Portable chest x-ray [DATE] at [DATE] a.m..

CLINICAL DATA: Intubated patient, respiratory failure, drug
overdose, septic shock, current smoker.

EXAM:
PORTABLE CHEST 1 VIEW

[chest ap]
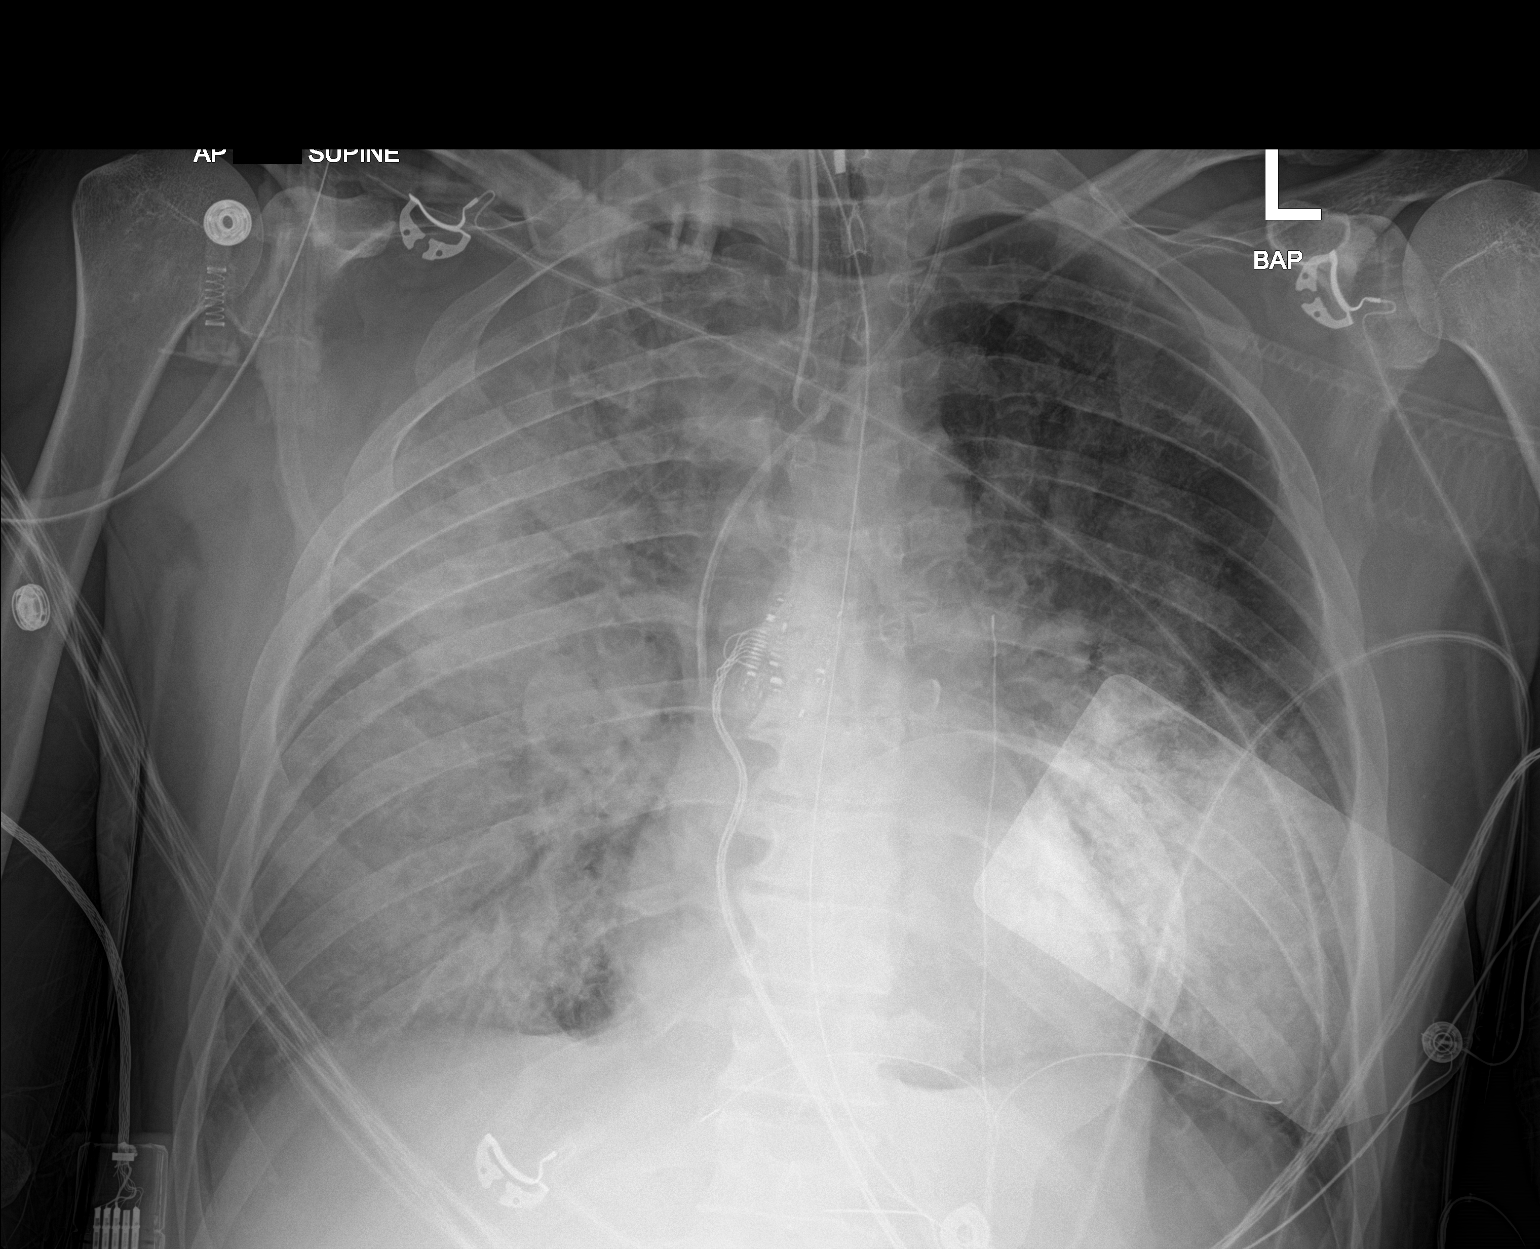

[1 of 1 positions shown; findings below may reference images not displayed]

FINDINGS: Confluent airspace opacities are present throughout the right lung
and are little changed. On the left increasing airspace opacity is
developing in the upper lobe. Persistent increased density in the
lower lobe is present. The heart is normal in size. The central
pulmonary vascularity is prominent. External pacemaker defibrillator
pads are present. The endotracheal tube tip projects 4.1 cm above
the carina. The esophagogastric tube tip in proximal port project
below the GE junction. The left internal jugular venous catheter tip
projects over the midportion of the SVC.
IMPRESSION: Interval intubation of the trachea and esophagus and placement of a
left internal jugular venous catheter. The support structures are in
reasonable position.

Worsening airspace opacity in the left upper lung. Fairly stable
airspace opacities elsewhere in both lungs.

## 2018-02-10 IMAGING — DX DG CHEST 1V PORT
1 series · 1 of 1 positions shown · non-contrast
Comparison: Chest radiograph dated [DATE]

CLINICAL DATA: 47-year-old male with possible sepsis and pneumonia.

EXAM:
PORTABLE CHEST 1 VIEW

[chest ap]
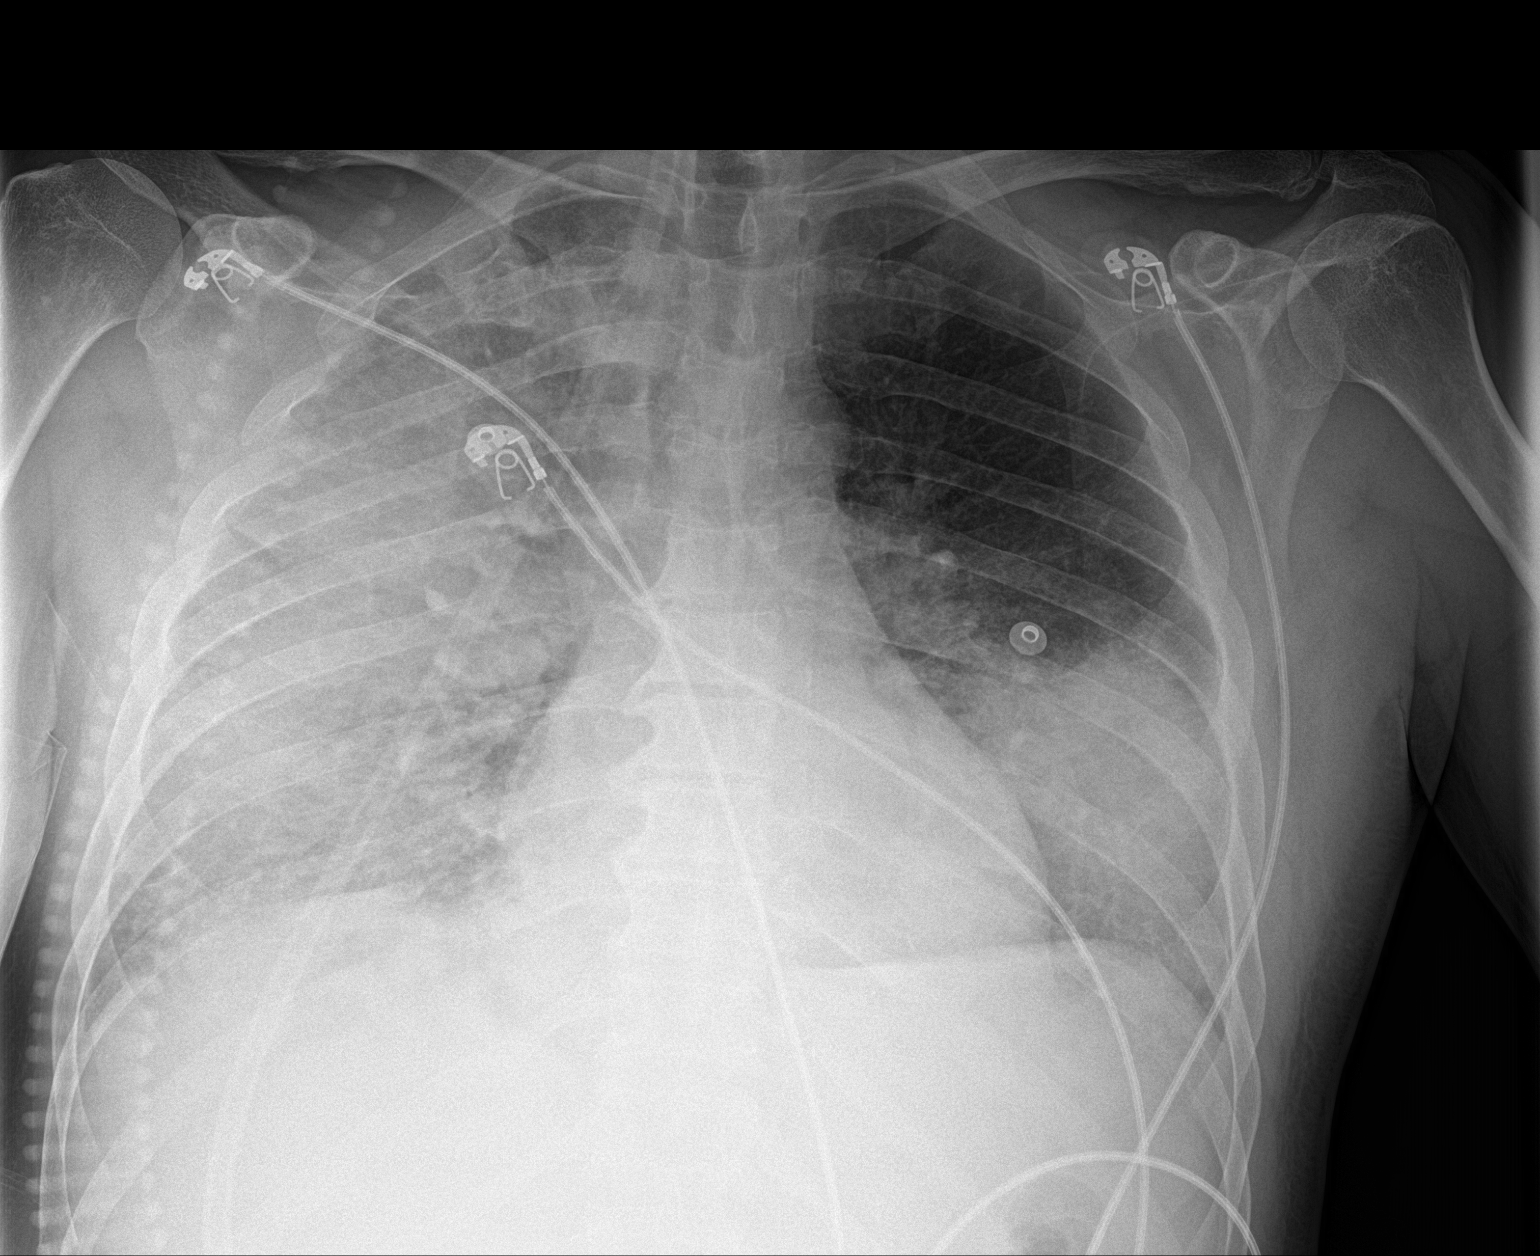

[1 of 1 positions shown; findings below may reference images not displayed]

FINDINGS: Bilateral airspace opacities, right greater left similar or slightly
worsened since the prior radiograph. No large pleural effusion. No
pneumothorax. The cardiac silhouette is within normal limits. No
acute osseous pathology.
IMPRESSION: Bilateral airspace opacities, right greater left, similar or
slightly worsened since the prior radiograph.

## 2018-02-10 MED ORDER — POTASSIUM CHLORIDE 10 MEQ/100ML IV SOLN
10.0000 meq | INTRAVENOUS | Status: AC
  Administered 2018-02-10 (×2): 10 meq via INTRAVENOUS
  Filled 2018-02-10 (×2): qty 100

## 2018-02-10 MED ORDER — AMIODARONE HCL IN DEXTROSE 360-4.14 MG/200ML-% IV SOLN
60.0000 mg/h | INTRAVENOUS | Status: DC
  Administered 2018-02-10 (×2): 60 mg/h via INTRAVENOUS
  Filled 2018-02-10: qty 200

## 2018-02-10 MED ORDER — DILTIAZEM HCL 25 MG/5ML IV SOLN
10.0000 mg | Freq: Four times a day (QID) | INTRAVENOUS | Status: DC | PRN
  Filled 2018-02-10: qty 5

## 2018-02-10 MED ORDER — ROCURONIUM BROMIDE 50 MG/5ML IV SOLN
60.0000 mg | Freq: Once | INTRAVENOUS | Status: AC
  Administered 2018-02-10: 60 mg via INTRAVENOUS

## 2018-02-10 MED ORDER — SODIUM CHLORIDE 0.9 % IV SOLN
INTRAVENOUS | Status: DC
  Administered 2018-02-10: 08:00:00 via INTRAVENOUS

## 2018-02-10 MED ORDER — MIDAZOLAM HCL 2 MG/2ML IJ SOLN
INTRAMUSCULAR | Status: AC
  Administered 2018-02-10: 2 mg
  Filled 2018-02-10: qty 2

## 2018-02-10 MED ORDER — LEVALBUTEROL HCL 0.63 MG/3ML IN NEBU: 0.6300 mg | INHALATION_SOLUTION | Freq: Four times a day (QID) | RESPIRATORY_TRACT | Status: DC | PRN

## 2018-02-10 MED ORDER — SODIUM CHLORIDE 0.9 % IV SOLN
INTRAVENOUS | Status: DC
  Administered 2018-02-10 – 2018-02-11 (×2): via INTRAVENOUS

## 2018-02-10 MED ORDER — AMIODARONE HCL IN DEXTROSE 360-4.14 MG/200ML-% IV SOLN
INTRAVENOUS | Status: AC
  Filled 2018-02-10: qty 200

## 2018-02-10 MED ORDER — FENTANYL BOLUS VIA INFUSION
50.0000 ug | INTRAVENOUS | Status: DC | PRN
  Administered 2018-02-10 – 2018-02-17 (×11): 50 ug via INTRAVENOUS
  Administered 2018-02-18: 25 ug via INTRAVENOUS
  Administered 2018-02-18 – 2018-02-19 (×2): 50 ug via INTRAVENOUS
  Filled 2018-02-10: qty 50

## 2018-02-10 MED ORDER — CISATRACURIUM BOLUS VIA INFUSION
0.0500 mg/kg | Freq: Once | INTRAVENOUS | Status: AC
  Administered 2018-02-10: 3.6 mg via INTRAVENOUS
  Filled 2018-02-10: qty 4

## 2018-02-10 MED ORDER — LORAZEPAM 2 MG/ML IJ SOLN: 0.5000 mg | Freq: Once | INTRAMUSCULAR | Status: DC

## 2018-02-10 MED ORDER — MAGNESIUM SULFATE IN D5W 1-5 GM/100ML-% IV SOLN
1.0000 g | Freq: Once | INTRAVENOUS | Status: AC
  Administered 2018-02-10: 1 g via INTRAVENOUS
  Filled 2018-02-10: qty 100

## 2018-02-10 MED ORDER — AMIODARONE LOAD VIA INFUSION
150.0000 mg | Freq: Once | INTRAVENOUS | Status: AC
  Administered 2018-02-10: 150 mg via INTRAVENOUS
  Filled 2018-02-10: qty 83.34

## 2018-02-10 MED ORDER — NOREPINEPHRINE 4 MG/250ML-% IV SOLN
INTRAVENOUS | Status: AC
  Administered 2018-02-10: 4 mg
  Filled 2018-02-10: qty 250

## 2018-02-10 MED ORDER — PHENYLEPHRINE HCL-NACL 10-0.9 MG/250ML-% IV SOLN
0.0000 ug/min | INTRAVENOUS | Status: DC
  Administered 2018-02-10: 200 ug/min via INTRAVENOUS
  Administered 2018-02-10: 10 ug/min via INTRAVENOUS
  Filled 2018-02-10 (×2): qty 250

## 2018-02-10 MED ORDER — AMIODARONE HCL IN DEXTROSE 360-4.14 MG/200ML-% IV SOLN
30.0000 mg/h | INTRAVENOUS | Status: DC
  Administered 2018-02-10 – 2018-02-14 (×7): 30 mg/h via INTRAVENOUS
  Filled 2018-02-10 (×8): qty 200

## 2018-02-10 MED ORDER — ADENOSINE 6 MG/2ML IV SOLN
INTRAVENOUS | Status: AC
  Administered 2018-02-10: 12 mg
  Filled 2018-02-10: qty 2

## 2018-02-10 MED ORDER — ALBUTEROL SULFATE (2.5 MG/3ML) 0.083% IN NEBU
2.5000 mg | INHALATION_SOLUTION | RESPIRATORY_TRACT | Status: DC | PRN
  Administered 2018-02-10 (×2): 2.5 mg via RESPIRATORY_TRACT
  Filled 2018-02-10 (×2): qty 3

## 2018-02-10 MED ORDER — VANCOMYCIN HCL IN DEXTROSE 750-5 MG/150ML-% IV SOLN
750.0000 mg | Freq: Two times a day (BID) | INTRAVENOUS | Status: DC
  Administered 2018-02-10 – 2018-02-11 (×2): 750 mg via INTRAVENOUS
  Filled 2018-02-10 (×3): qty 150

## 2018-02-10 MED ORDER — SODIUM CHLORIDE 0.9 % IV SOLN
INTRAVENOUS | Status: DC | PRN
  Administered 2018-02-13 – 2018-02-20 (×6): 250 mL via INTRAVENOUS

## 2018-02-10 MED ORDER — SODIUM CHLORIDE 0.9 % IV SOLN
3.0000 ug/kg/min | INTRAVENOUS | Status: DC
  Administered 2018-02-10: 3 ug/kg/min via INTRAVENOUS
  Administered 2018-02-11: 2 ug/kg/min via INTRAVENOUS
  Filled 2018-02-10 (×3): qty 20

## 2018-02-10 MED ORDER — ACETAMINOPHEN 650 MG RE SUPP
650.0000 mg | Freq: Four times a day (QID) | RECTAL | Status: DC | PRN
  Administered 2018-02-10: 650 mg via RECTAL
  Filled 2018-02-10 (×2): qty 1

## 2018-02-10 MED ORDER — FENTANYL BOLUS VIA INFUSION
50.0000 ug | INTRAVENOUS | Status: DC | PRN
  Filled 2018-02-10: qty 50

## 2018-02-10 MED ORDER — FENTANYL CITRATE (PF) 100 MCG/2ML IJ SOLN: 50.0000 ug | Freq: Once | INTRAMUSCULAR | Status: AC

## 2018-02-10 MED ORDER — DILTIAZEM HCL 25 MG/5ML IV SOLN
10.0000 mg | INTRAVENOUS | Status: AC
  Administered 2018-02-10: 10 mg via INTRAVENOUS
  Filled 2018-02-10: qty 5

## 2018-02-10 MED ORDER — PHENYLEPHRINE HCL-NACL 40-0.9 MG/250ML-% IV SOLN
0.0000 ug/min | INTRAVENOUS | Status: DC
  Administered 2018-02-10: 310 ug/min via INTRAVENOUS
  Administered 2018-02-10: 300 ug/min via INTRAVENOUS
  Administered 2018-02-10: 200 ug/min via INTRAVENOUS
  Administered 2018-02-11: 225 ug/min via INTRAVENOUS
  Administered 2018-02-11 (×2): 320 ug/min via INTRAVENOUS
  Administered 2018-02-11: 53.333 ug/min via INTRAVENOUS
  Administered 2018-02-11: 225 ug/min via INTRAVENOUS
  Administered 2018-02-11: 300 ug/min via INTRAVENOUS
  Administered 2018-02-11: 220 ug/min via INTRAVENOUS
  Administered 2018-02-11: 330 ug/min via INTRAVENOUS
  Administered 2018-02-11: 340 ug/min via INTRAVENOUS
  Administered 2018-02-12: 240 ug/min via INTRAVENOUS
  Administered 2018-02-12: 220 ug/min via INTRAVENOUS
  Administered 2018-02-12: 190 ug/min via INTRAVENOUS
  Administered 2018-02-12: 200 ug/min via INTRAVENOUS
  Administered 2018-02-12: 400 ug/min via INTRAVENOUS
  Administered 2018-02-12: 260 ug/min via INTRAVENOUS
  Administered 2018-02-13: 400 ug/min via INTRAVENOUS
  Administered 2018-02-13: 300 ug/min via INTRAVENOUS
  Administered 2018-02-13: 40 ug/min via INTRAVENOUS
  Administered 2018-02-13: 400 ug/min via INTRAVENOUS
  Administered 2018-02-13: 220 ug/min via INTRAVENOUS
  Administered 2018-02-13: 270 ug/min via INTRAVENOUS
  Administered 2018-02-13: 400 ug/min via INTRAVENOUS
  Administered 2018-02-13: 220 ug/min via INTRAVENOUS
  Filled 2018-02-10 (×30): qty 250

## 2018-02-10 MED ORDER — ORAL CARE MOUTH RINSE
15.0000 mL | OROMUCOSAL | Status: DC
  Administered 2018-02-10 – 2018-02-20 (×91): 15 mL via OROMUCOSAL

## 2018-02-10 MED ORDER — PIPERACILLIN-TAZOBACTAM 3.375 G IVPB
3.3750 g | Freq: Three times a day (TID) | INTRAVENOUS | Status: DC
  Administered 2018-02-10 – 2018-02-12 (×7): 3.375 g via INTRAVENOUS
  Filled 2018-02-10 (×7): qty 50

## 2018-02-10 MED ORDER — DM-GUAIFENESIN ER 30-600 MG PO TB12
1.0000 | ORAL_TABLET | Freq: Two times a day (BID) | ORAL | Status: DC
  Administered 2018-02-10: 1 via ORAL
  Filled 2018-02-10 (×2): qty 1

## 2018-02-10 MED ORDER — PROPOFOL 1000 MG/100ML IV EMUL
5.0000 ug/kg/min | INTRAVENOUS | Status: DC
  Administered 2018-02-10 (×3): 80 ug/kg/min via INTRAVENOUS
  Administered 2018-02-10: 50 ug/kg/min via INTRAVENOUS
  Administered 2018-02-11 (×3): 80 ug/kg/min via INTRAVENOUS
  Administered 2018-02-11: 45 ug/kg/min via INTRAVENOUS
  Administered 2018-02-11: 80 ug/kg/min via INTRAVENOUS
  Administered 2018-02-11: 75 ug/kg/min via INTRAVENOUS
  Administered 2018-02-11 – 2018-02-12 (×3): 45 ug/kg/min via INTRAVENOUS
  Administered 2018-02-12 (×2): 50 ug/kg/min via INTRAVENOUS
  Administered 2018-02-12 – 2018-02-13 (×3): 40 ug/kg/min via INTRAVENOUS
  Administered 2018-02-13 (×2): 45 ug/kg/min via INTRAVENOUS
  Filled 2018-02-10 (×19): qty 100

## 2018-02-10 MED ORDER — SODIUM CHLORIDE 0.9 % IV BOLUS: 500.0000 mL | Freq: Once | INTRAVENOUS | Status: DC

## 2018-02-10 MED ORDER — FENTANYL CITRATE (PF) 100 MCG/2ML IJ SOLN: 100.0000 ug | Freq: Once | INTRAMUSCULAR | Status: DC | PRN

## 2018-02-10 MED ORDER — DILTIAZEM HCL 60 MG PO TABS
60.0000 mg | ORAL_TABLET | Freq: Three times a day (TID) | ORAL | Status: DC
  Filled 2018-02-10: qty 1

## 2018-02-10 MED ORDER — SODIUM CHLORIDE 0.9 % IV BOLUS
1000.0000 mL | Freq: Once | INTRAVENOUS | Status: AC
  Administered 2018-02-10: 1000 mL via INTRAVENOUS

## 2018-02-10 MED ORDER — ADENOSINE 6 MG/2ML IV SOLN
INTRAVENOUS | Status: AC
  Administered 2018-02-10: 6 mg
  Filled 2018-02-10: qty 2

## 2018-02-10 MED ORDER — POTASSIUM CHLORIDE 10 MEQ/100ML IV SOLN
10.0000 meq | Freq: Once | INTRAVENOUS | Status: AC
  Administered 2018-02-10: 10 meq via INTRAVENOUS
  Filled 2018-02-10: qty 100

## 2018-02-10 MED ORDER — FENTANYL CITRATE (PF) 100 MCG/2ML IJ SOLN
INTRAMUSCULAR | Status: AC
  Administered 2018-02-10: 100 ug
  Filled 2018-02-10: qty 2

## 2018-02-10 MED ORDER — DIGOXIN 0.25 MG/ML IJ SOLN
0.2500 mg | Freq: Four times a day (QID) | INTRAMUSCULAR | Status: DC
  Filled 2018-02-10: qty 2

## 2018-02-10 MED ORDER — CHLORHEXIDINE GLUCONATE 0.12% ORAL RINSE (MEDLINE KIT)
15.0000 mL | Freq: Two times a day (BID) | OROMUCOSAL | Status: DC
  Administered 2018-02-10 – 2018-02-19 (×19): 15 mL via OROMUCOSAL

## 2018-02-10 MED ORDER — FENTANYL CITRATE (PF) 100 MCG/2ML IJ SOLN
100.0000 ug | Freq: Once | INTRAMUSCULAR | Status: AC
  Administered 2018-02-10: 100 ug via INTRAVENOUS
  Filled 2018-02-10: qty 2

## 2018-02-10 MED ORDER — FENTANYL 2500MCG IN NS 250ML (10MCG/ML) PREMIX INFUSION
25.0000 ug/h | INTRAVENOUS | Status: DC
  Administered 2018-02-10: 5 ug/h via INTRAVENOUS
  Administered 2018-02-11 (×2): 400 ug/h via INTRAVENOUS
  Filled 2018-02-10 (×4): qty 250

## 2018-02-10 MED ORDER — LACTATED RINGERS IV SOLN
INTRAVENOUS | Status: DC
  Administered 2018-02-10: 11:00:00 via INTRAVENOUS

## 2018-02-10 MED ORDER — PROPOFOL 1000 MG/100ML IV EMUL
INTRAVENOUS | Status: AC
  Filled 2018-02-10: qty 100

## 2018-02-10 MED ORDER — FENTANYL 2500MCG IN NS 250ML (10MCG/ML) PREMIX INFUSION: 100.0000 ug/h | INTRAVENOUS | Status: DC

## 2018-02-10 MED ORDER — ETOMIDATE 2 MG/ML IV SOLN
20.0000 mg | Freq: Once | INTRAVENOUS | Status: AC
  Administered 2018-02-10: 20 mg via INTRAVENOUS

## 2018-02-10 MED ORDER — ARTIFICIAL TEARS OPHTHALMIC OINT
1.0000 "application " | TOPICAL_OINTMENT | Freq: Three times a day (TID) | OPHTHALMIC | Status: DC
  Administered 2018-02-10 – 2018-02-12 (×8): 1 via OPHTHALMIC
  Filled 2018-02-10: qty 3.5

## 2018-02-10 MED ORDER — PIPERACILLIN-TAZOBACTAM 3.375 G IVPB 30 MIN
3.3750 g | Freq: Once | INTRAVENOUS | Status: AC
  Administered 2018-02-10: 3.375 g via INTRAVENOUS
  Filled 2018-02-10 (×2): qty 50

## 2018-02-10 NOTE — Progress Notes (Signed)
Patients head turned to right side. Sats 95 at this time. Will continue to monitor.

## 2018-02-10 NOTE — Significant Event (Signed)
Rapid Response Event Note  Overview: Time Called: 1140 Arrival Time: 1142 Event Type: Respiratory  Initial Focused Assessment: Patient on Cpap Fio2 70%.  He is alert and oriented but is anxious and has increased work of breathing.  Briefly removed from Cpap mask to answer questions.  De sat into low 80s.  Lung sounds crackles in bases  Irregular heart tones. AF 140-160  BP 126/75  RR 30s  O2 sat on mask 93% Wife at bedside  Interventions: 10mg  Cardizem IV Changed setting to Bipap Fio2 70% No change in HR.  Patient becoming more restless.  RR 40s Dr Thedore MinsSingh notified of patient status.  At bedside to assess patient. CCM consulted:  Pete at bedside to assess patient. Rectal temp 103.5  RR 42  AF 150-160  BP 122/80 manual  Patient transported on Bipap with heart monitor to 2M12. MICU staff at bedside to receive patient. Patient urgently intubated.   Plan of Care (if not transferred):  Event Summary: Name of Physician Notified: singh at 1140  Name of Consulting Physician Notified: Pete/Yacoub  CCM at 1200  Outcome: Transferred (Comment)  Event End Time: 1300  Marcellina MillinLayton, Chasta Deshpande

## 2018-02-10 NOTE — Progress Notes (Signed)
RT called to patient room due to patient having a decrease in sats to 70-80s.  Increased patient FIO2 from 40% to 70%.  Patient also noted to be tachycardic.  RN to page MD and inform MD on patient's current status.

## 2018-02-10 NOTE — Procedures (Signed)
Intubation Procedure Note Roberto Knapp [id number] 1970-05-20  Procedure: Intubation Indications: Respiratory insufficiency  Procedure Details Consent: Risks of procedure as well as the alternatives and risks of each were explained to the (patient/caregiver).  Consent for procedure obtained. Time Out: Verified patient identification, verified procedure, site/side was marked, verified correct patient position, special equipment/implants available, medications/allergies/relevent history reviewed, required imaging and test results available.  Performed  Maximum sterile technique was used including antiseptics, cap, gloves, gown, hand hygiene, mask and sheet.  MAC and 4    Evaluation Hemodynamic Status: BP stable throughout; O2 sats: stable throughout and transiently fell during during procedure Patient's Current Condition: stable Complications: No apparent complications Patient did tolerate procedure well. Chest X-ray ordered to verify placement.  CXR: pending.  Erick Colace ACNP-BC [place] Pager # [phone] OR # [phone] if no answer  Clementeen Graham 02/10/2018

## 2018-02-10 NOTE — Procedures (Signed)
OGT Placement By MD  Placed under direct laryngoscopy and verified by auscultation  Wesam G. Yacoub, M.D. [facility]. Pager: [phone]. After hours pager: [phone]. 

## 2018-02-10 NOTE — Significant Event (Signed)
Rapid Response Event Note  Overview:  While looking over charts, noticed pts MEWS high. Went to speak with bedside RN about pt status. RN was about to give pain medication on my arrival. She states pt has looked like this since admission to unit.     On arrival, pt laying in bed moaning in clear discomfort. HR 140-150s, BP 134/77, spO2 86% on 7L [place], RR 35-40. Lungs with wheezing throughout. PT c/o constant abd pain. Pt placed on NRB-sats increased to 99%. Neb treatment given, [place] NP paged about pt status who states she will be up to see pt. Oral temp 102.9, tylenol given. [place] Np paged and to bedside. Abg, 500ml bolus, bipap, cxr, CBC, LA, ordered.     Plan of Care (if not transferred): Continue to monitor pt. Temp and pain control, bipap to help with breathing. Awaiting labs and cxr. RN instructed to call with any changes or concerns.   Event Summary:  arrived at 0439     event ended at  0555        Mordecai RasmussenNicole  Breyden Jeudy

## 2018-02-10 NOTE — Procedures (Signed)
Bronchoscopy Procedure Note Nolon BussingGary W Gorelick [id number] 1970/03/22  Procedure: Bronchoscopy Indications: Obtain specimens for culture and/or other diagnostic studies  Procedure Details Consent: Unable to obtain consent because of emergent medical necessity. Time Out: Verified patient identification, verified procedure, site/side was marked, verified correct patient position, special equipment/implants available, medications/allergies/relevent history reviewed, required imaging and test results available.  Performed  In preparation for procedure, patient was given 100% FiO2 and bronchoscope lubricated. Sedation: Benzodiazepines, Muscle relaxants, Etomidate and Fentanyl  Airway entered and the following bronchi were examined: RUL, RML, RLL, LUL, LLL and Bronchi.   Procedures performed: Brushings performed Bronchoscope removed.  , Patient placed back on 100% FiO2 at conclusion of procedure.    Evaluation Hemodynamic Status: BP stable throughout; O2 sats: stable throughout Patient's Current Condition: stable Specimens:  Sent purulent fluid Complications: No apparent complications Patient did tolerate procedure well.   YACOUB,WESAM 02/10/2018

## 2018-02-10 NOTE — Progress Notes (Signed)
Patient proned and head flipped to other side. ETT 24@lip  repositioned to left side. Sats 97 at this time. Will continue to monitor.

## 2018-02-10 NOTE — Plan of Care (Signed)
  Problem: Activity: Goal: Ability to tolerate increased activity will improve Outcome: Progressing   Problem: Clinical Measurements: Goal: Ability to maintain a body temperature in the normal range will improve Outcome: Progressing   

## 2018-02-10 NOTE — Progress Notes (Signed)
Amiodarone Drug - Drug Interaction Consult Note  Recommendations:  Amiodarone is metabolized by the cytochrome P450 system and therefore has the potential to cause many drug interactions. Amiodarone has an average plasma half-life of 50 days (range 20 to 100 days).   There is potential for drug interactions to occur several weeks or months after stopping treatment and the onset of drug interactions may be slow after initiating amiodarone.   []  Statins: Increased risk of myopathy. Simvastatin- restrict dose to 20mg  daily. Other statins: counsel patients to report any muscle pain or weakness immediately.  []  Anticoagulants: Amiodarone can increase anticoagulant effect. Consider warfarin dose reduction. Patients should be monitored closely and the dose of anticoagulant altered accordingly, remembering that amiodarone levels take several weeks to stabilize.  []  Antiepileptics: Amiodarone can increase plasma concentration of phenytoin, the dose should be reduced. Note that small changes in phenytoin dose can result in large changes in levels. Monitor patient and counsel on signs of toxicity.  []  Beta blockers: increased risk of bradycardia, AV block and myocardial depression. Sotalol - avoid concomitant use.  []   Calcium channel blockers (diltiazem and verapamil): increased risk of bradycardia, AV block and myocardial depression.  []   Cyclosporine: Amiodarone increases levels of cyclosporine. Reduced dose of cyclosporine is recommended.  []  Digoxin dose should be halved when amiodarone is started.  []  Diuretics: increased risk of cardiotoxicity if hypokalemia occurs.  []  Oral hypoglycemic agents (glyburide, glipizide, glimepiride): increased risk of hypoglycemia. Patient's glucose levels should be monitored closely when initiating amiodarone therapy.   [x]  Drugs that prolong the QT interval:  Torsades de pointes risk may be increased with concurrent use - avoid if possible.  Monitor QTc, also  keep magnesium/potassium WNL if concurrent therapy can't be avoided. [name] Kitchen. Antibiotics: e.g. fluoroquinolones, erythromycin. . Antiarrhythmics: e.g. quinidine, procainamide, disopyramide, sotalol. . Antipsychotics: e.g. phenothiazines, haloperidol.  . Lithium, tricyclic antidepressants, and methadone. Thank You,  Chelsea Aushuy D. Laney Potashang, PharmD, BCPS, BCCCP 02/10/2018, 1:18 PM

## 2018-02-10 NOTE — Procedures (Signed)
Arterial Catheter Insertion Procedure Note Roberto BussingGary W Knapp [id number] 01-28-1971  Procedure: Insertion of Arterial Catheter  Indications: Blood pressure monitoring  Procedure Details Consent: Risks of procedure as well as the alternatives and risks of each were explained to the (patient/caregiver).  Consent for procedure obtained. Time Out: Verified patient identification, verified procedure, site/side was marked, verified correct patient position, special equipment/implants available, medications/allergies/relevent history reviewed, required imaging and test results available.  Performed  Maximum sterile technique was used including antiseptics, cap, gloves, gown, hand hygiene, mask and sheet. Skin prep: Chlorhexidine; local anesthetic administered 20 gauge catheter was inserted into left radial artery using the Seldinger technique.  Evaluation Blood flow good; BP tracing good. Complications: No apparent complications.   Roberto Knapp 02/10/2018

## 2018-02-10 NOTE — Procedures (Signed)
Intubation Procedure Note Roberto Knapp [id number] Apr 03, 1970  Procedure: Intubation Indications: Respiratory insufficiency  Procedure Details Consent: Unable to obtain consent because of emergent medical necessity. Time Out: Verified patient identification, verified procedure, site/side was marked, verified correct patient position, special equipment/implants available, medications/allergies/relevent history reviewed, required imaging and test results available.  Performed  Maximum sterile technique was used including antiseptics, gloves, hand hygiene and mask.  MAC    Evaluation Hemodynamic Status: BP stable throughout; O2 sats: stable throughout Patient's Current Condition: stable Complications: No apparent complications Patient did tolerate procedure well. Chest X-ray ordered to verify placement.  CXR: pending.   YACOUB,WESAM 02/10/2018

## 2018-02-10 NOTE — Progress Notes (Signed)
PT was prone  Tube secured at 24 @ lip  sats are stable RT to monitor

## 2018-02-10 NOTE — Procedures (Signed)
Central Venous hemodialysis Catheter Insertion Procedure Note Roberto BussingGary W Knapp [id number] 09-Aug-1970  Procedure: Insertion of Central Venous hemodialysis Catheter Indications: Drug and/or fluid administration  Procedure Details Consent: Risks of procedure as well as the alternatives and risks of each were explained to the (patient/caregiver).  Consent for procedure obtained. Time Out: Verified patient identification, verified procedure, site/side was marked, verified correct patient position, special equipment/implants available, medications/allergies/relevent history reviewed, required imaging and test results available.  Performed  Maximum sterile technique was used including antiseptics, cap, gloves, gown, hand hygiene, mask and sheet. Skin prep: Chlorhexidine; local anesthetic administered A antimicrobial bonded/coated triple lumen catheter was placed in the left internal jugular vein using the Seldinger technique.  Evaluation Blood flow good Complications: No apparent complications Patient did tolerate procedure well. Chest X-ray ordered to verify placement.  CXR: performed; in stable location.  Roberto Knapp 02/10/2018, 3:36 PM

## 2018-02-10 NOTE — Progress Notes (Signed)
Pt had been calm and restful on CPAP this morning after being weaned from BiPap. After taking some sips of juice Pt became hypoxic in 80s r/t removing mask from face. Pt did not choke or have difficulties drinking liquids. RT was notified of Pt's O2 sats, Pt FiO2 increased back to 70. O2 sats came back up to low 90s however WOB was increased and RR increased. HR was tachy in 130-140s and then went Afib w/ RVR into 160-170s. MD notified of Pt's condition. Verbal order for 10mg  Cardizem IV once given with no improvement. Rapid Response called to bedside. MD notified CCM and they came to bedside. Pt transferred to ICU 40M, report given at bedside.

## 2018-02-10 NOTE — Progress Notes (Signed)
Pharmacy Antibiotic Note  Roberto Knapp is a 47 y.o. male admitted on 02/09/2018 with pneumonia.  Pharmacy has been consulted for vancomycin dosing. -WBC= 18.2, tmax= 103.5, SCr= 1.2, CrCl ~ 70, PCT= 31 -vancomycin AUC= 500 -vancomycin 128m IV given 12/8 and ~ 8pm  Plan: -Vancomycin 7585mIV q12h -Will follow renal function, cultures and clinical progress   Height: _0  (185.4 cm) Weight: 157 lb 6.5 oz (71.4 kg) IBW/kg (Calculated) : 79.9  Temp (24hrs), Avg:101.1 F (38.4 C), Min:98.3 F (36.8 C), Max:103.5 F (39.7 C)  Recent Labs  Lab 02/09/18 1725 02/09/18 1757 02/09/18 1758 02/09/18 2014 02/09/18 2124 02/10/18 0540 02/10/18 0759  WBC 18.8*  --   --   --   --  18.2*  --   CREATININE 1.20 1.30*  --   --  1.14 1.22  --   LATICACIDVEN  --   --  1.74 1.33  --  1.8 1.5    Estimated Creatinine Clearance: 74 mL/min (by C-G formula based on SCr of 1.22 mg/dL).    No Known Allergies  Antimicrobials this admission: Vanc x1 12/8; 12/9>> Cefepime x1 12/8 Flagyl 12/8 >> 12/9 Zosyn 12/9 >> Azith 12/8 >>  Dose adjustments this admission:   Microbiology results: 12/8 BCx -  12/8 UCx - negative 12/8 GI panel PCR -  12/8 C.diff - negative 12/9 MRSA PCR - negative 12/9 BAL -  Thank you for allowing pharmacy to be a part of this patient's care.  AnHildred LaserPharmD Clinical Pharmacist **Pharmacist phone directory can now be found on [email or web address] ([email or web address]).  Listed under [facility]

## 2018-02-10 NOTE — Progress Notes (Addendum)
PROGRESS NOTE                                                                                                                                                                                                             Patient Demographics:    Roberto Knapp, is a 47 y.o. male, DOB - April 30, 1970, JXB:[id number]  Admit date - 02/09/2018   Admitting Physician Briscoe Deutscher, MD  Outpatient Primary MD for the patient is [name], MD  LOS - 1  Chief Complaint  Patient presents with  . Fever  . Diarrhea  . Rectal Bleeding  . Cough  . Shortness of Breath       Brief Narrative  Roberto Knapp is a 47 y.o. male who denies any significant past medical history, now presenting to the emergency department with approximately 10 days of fevers, generalized aches, abdominal pain, nausea, vomiting, diarrhea, cough, and shortness of breath.  Symptoms began with fevers, generalized aches, abdominal discomfort, and nausea with nonbloody vomiting, and diarrhea, but have progressed to include worsening shortness of breath, productive cough, melena, and then bright red blood per rectum. He was admitted with acute hypoxic respiratory failure due to pneumonia with sepsis, severe dehydration with hyponatremia and hypokalemia along with mild blood per rectum.   Subjective:    Roberto Knapp today has, No headache, No chest pain, No abdominal pain - No Nausea, No new weakness tingling or numbness, No Cough - improving SOB.     Assessment  & Plan :     1.  Sepsis with acute hypoxic respiratory failure due to severe pneumonia.  Likely community-acquired aspiration pneumonia.  Continue CPAP for now, try to titrate down FiO2 and transition to Ventimask, continue IV fluids aggressively will repeat another bolus this morning along with maintenance, antibiotics adjusted for now to combination of Zosyn and azithromycin.  MRSA nasal PCR is negative hence we will stop vancomycin.  Stable lactate, procalcitonin  elevated.  Continues to be sick monitor closely in SDU, will also involve ICU team as there is a good chance he might get intubated.  Addendum at 11:45 AM patient went into A. fib with RVR.  This likely is respiratory stress induced lone A. fib, will check TSH and echocardiogram, will place him on Cardizem IV as needed along with oral scheduled for rate control, 2 doses of IV digoxin as blood pressure is slightly soft.  Monitor on telemetry.    2.  Severe dehydration with hyponatremia and hypokalemia.  Hydrate and replace potassium.  3.  Bright red blood per rectum.  Possible hemorrhoidal bleed due to diarrhea and frequent wiping, currently on IV PPI as he had NSAID use recently, stable BUN suggesting no significant upper GI bleed, type screen has been done H&H has been relatively stable when accounted for dilution, will monitor.  4.  Elevated LFTs.  Likely due to combination of sepsis and alcohol use.  Trend.  5.  Gastroenteritis.  Resolved.  GI pathogen panel pending.  C. difficile negative.  6.  Alcohol use.  Claims he drinks 2 bottles of beer maximum every night, should not go DTs will monitor.   Family Communication  : wife  Code Status :  Full  Disposition Plan  :  SDU  Consults  :  None  Procedures  :    DVT Prophylaxis  :   SCDs    Lab Results  Component Value Date   PLT 149 (L) 02/10/2018    Diet :  Diet Order            Diet clear liquid Room service appropriate? Yes; Fluid consistency: Thin  Diet effective now               Inpatient Medications Scheduled Meds: . dextromethorphan-guaiFENesin  1 tablet Oral BID  . pantoprazole (PROTONIX) IV  40 mg Intravenous Q12H  . sodium chloride flush  3 mL Intravenous Q12H   Continuous Infusions: . sodium chloride 75 mL/hr at 02/10/18 0738  . azithromycin Stopped (02/09/18 2228)  . magnesium sulfate 1 - 4 g bolus IVPB    . piperacillin-tazobactam (ZOSYN)  IV    . potassium chloride     PRN Meds:.acetaminophen  **OR** [DISCONTINUED] acetaminophen, HYDROcodone-acetaminophen, levalbuterol, [DISCONTINUED] ondansetron **OR** ondansetron (ZOFRAN) IV  Antibiotics  :   Anti-infectives (From admission, onward)   Start     Dose/Rate Route Frequency Ordered Stop   02/10/18 2000  vancomycin (VANCOCIN) 1,250 mg in sodium chloride 0.9 % 250 mL IVPB  Status:  Discontinued     1,250 mg 166.7 mL/hr over 90 Minutes Intravenous Every 24 hours 02/09/18 1839 02/10/18 0728   02/10/18 1400  piperacillin-tazobactam (ZOSYN) IVPB 3.375 g     3.375 g 12.5 mL/hr over 240 Minutes Intravenous Every 8 hours 02/10/18 0731     02/10/18 0745  piperacillin-tazobactam (ZOSYN) IVPB 3.375 g     3.375 g 100 mL/hr over 30 Minutes Intravenous  Once 02/10/18 0728 02/10/18 0921   02/10/18 0600  ceFEPIme (MAXIPIME) 2 g in sodium chloride 0.9 % 100 mL IVPB  Status:  Discontinued     2 g 200 mL/hr over 30 Minutes Intravenous Every 12 hours 02/09/18 1839 02/09/18 2014   02/10/18 0200  cefTRIAXone (ROCEPHIN) 1 g in sodium chloride 0.9 % 100 mL IVPB  Status:  Discontinued     1 g 200 mL/hr over 30 Minutes Intravenous Every 24 hours 02/09/18 2014 02/10/18 0728   02/09/18 2015  azithromycin (ZITHROMAX) 500 mg in sodium chloride 0.9 % 250 mL IVPB     500 mg 250 mL/hr over 60 Minutes Intravenous Every 24 hours 02/09/18 2014 02/16/18 2014   02/09/18 1830  vancomycin (VANCOCIN) 1,250 mg in sodium chloride 0.9 % 250 mL IVPB     1,250 mg 166.7 mL/hr over 90 Minutes Intravenous  Once 02/09/18 1800 02/09/18 2127   02/09/18 1800  ceFEPIme (MAXIPIME) 2 g in sodium chloride 0.9 % 100 mL IVPB     2 g 200 mL/hr over 30  Minutes Intravenous  Once 02/09/18 1745 02/09/18 1925   02/09/18 1800  metroNIDAZOLE (FLAGYL) IVPB 500 mg  Status:  Discontinued     500 mg 100 mL/hr over 60 Minutes Intravenous Every 8 hours 02/09/18 1745 02/10/18 0728   02/09/18 1800  vancomycin (VANCOCIN) IVPB 1000 mg/200 mL premix  Status:  Discontinued     1,000 mg 200 mL/hr over  60 Minutes Intravenous  Once 02/09/18 1745 02/09/18 1800          Objective:   Vitals:   02/10/18 0735 02/10/18 0800 02/10/18 0836 02/10/18 0845  BP:    110/80  Pulse: (!) 110  (!) 109 (!) 111  Resp: 18  (!) 33 (!) 25  Temp: 98.5 F (36.9 C) 98.3 F (36.8 C)    TempSrc: Axillary Axillary    SpO2: 94%  92% 91%  Weight:      Height:        Wt Readings from Last 3 Encounters:  02/10/18 71.4 kg  10/04/11 56.2 kg  09/25/11 57.3 kg     Intake/Output Summary (Last 24 hours) at 02/10/2018 0956 Last data filed at 02/09/2018 2350 Gross per 24 hour  Intake 1800 ml  Output -  Net 1800 ml     Physical Exam  Awake Alert, Oriented X 3, No new F.N deficits, Normal affect [place].AT,PERRAL Supple Neck,No JVD, No cervical lymphadenopathy appriciated.  Symmetrical Chest wall movement, Good air movement bilaterally, R>>L rales RRR,No Gallops,Rubs or new Murmurs, No Parasternal Heave +ve B.Sounds, Abd Soft, No tenderness, No organomegaly appriciated, No rebound - guarding or rigidity. No Cyanosis, Clubbing or edema, No new Rash or bruise     Data Review:    CBC Recent Labs  Lab 02/09/18 1725 02/09/18 1757 02/09/18 2124 02/10/18 0540  WBC 18.8*  --   --  18.2*  HGB 11.6* 12.2* 10.3* 10.6*  HCT 33.7* 36.0* 31.2* 31.2*  PLT 217  --   --  149*  MCV 88.5  --   --  89.9  MCH 30.4  --   --  30.5  MCHC 34.4  --   --  34.0  RDW 13.3  --   --  14.0  LYMPHSABS 0.4*  --   --   --   MONOABS 0.2  --   --   --   EOSABS 0.0  --   --   --   BASOSABS 0.0  --   --   --     Chemistries  Recent Labs  Lab 02/09/18 1725 02/09/18 1757 02/09/18 2124 02/10/18 0540  NA 121* 124* 129* 131*  K 3.0* 3.1* 3.5 3.4*  CL 92* 93* 99 101  CO2 19*  --  17* 20*  GLUCOSE 105* 98 84 122*  BUN 23* 26* 23* 17  CREATININE 1.20 1.30* 1.14 1.22  CALCIUM 7.4*  --  6.8* 7.2*  MG 1.9  --   --   --   AST 136*  --   --  115*  ALT 57*  --   --  46*  ALKPHOS 90  --   --  79  BILITOT 1.2  --   --  1.0    ------------------------------------------------------------------------------------------------------------------ No results for input(s): CHOL, HDL, LDLCALC, TRIG, CHOLHDL, LDLDIRECT in the last 72 hours.  No results found for: HGBA1C ------------------------------------------------------------------------------------------------------------------ No results for input(s): TSH, T4TOTAL, T3FREE, THYROIDAB in the last 72 hours.  Invalid input(s): FREET3 ------------------------------------------------------------------------------------------------------------------ No results for input(s): VITAMINB12, FOLATE, FERRITIN, TIBC, IRON, RETICCTPCT in  the last 72 hours.  Coagulation profile Recent Labs  Lab 02/09/18 1725  INR 1.14    No results for input(s): DDIMER in the last 72 hours.  Cardiac Enzymes Recent Labs  Lab 02/09/18 2124  TROPONINI 0.05*   ------------------------------------------------------------------------------------------------------------------ No results found for: BNP  Micro Results Recent Results (from the past 240 hour(s))  C difficile quick scan w PCR reflex     Status: None   Collection Time: 02/09/18  6:36 PM  Result Value Ref Range Status   C Diff antigen NEGATIVE NEGATIVE Final   C Diff toxin NEGATIVE NEGATIVE Final   C Diff interpretation No C. difficile detected.  Final    Comment: Performed at [facility], [address] [place], [place] [address]  MRSA PCR Screening     Status: None   Collection Time: 02/10/18  1:34 AM  Result Value Ref Range Status   MRSA by PCR NEGATIVE NEGATIVE Final    Comment:        The GeneXpert MRSA Assay (FDA approved for NASAL specimens only), is one component of a comprehensive MRSA colonization surveillance program. It is not intended to diagnose MRSA infection nor to guide or monitor treatment for MRSA infections. Performed at [facility], [address], [place], [place] [address]      Radiology Reports Ct Abdomen Pelvis W Contrast  Result Date: 02/09/2018 CLINICAL DATA:  Fever and generalized body aches since Thanksgiving. New onset diarrhea about a week ago. EXAM: CT ABDOMEN AND PELVIS WITH CONTRAST TECHNIQUE: Multidetector CT imaging of the abdomen and pelvis was performed using the standard protocol following bolus administration of intravenous contrast. CONTRAST:  OMNIPAQUE IOHEXOL 300 MG/ML  SOLN COMPARISON:  182016 FINDINGS: Lower chest: Confluent airspace opacity is identified in the lingula and both lower lobes. Hepatobiliary: No focal abnormality within the liver parenchyma. There is no evidence for gallstones, gallbladder wall thickening, or pericholecystic fluid. No intrahepatic or extrahepatic biliary dilation. Pancreas: No focal mass lesion. No dilatation of the main duct. No intraparenchymal cyst. No peripancreatic edema. Spleen: No splenomegaly. No focal mass lesion. Adrenals/Urinary Tract: No adrenal nodule or mass. Kidneys unremarkable. No evidence for hydroureter. The urinary bladder appears normal for the degree of distention. Stomach/Bowel: Small hiatal hernia. Stomach otherwise unremarkable. Duodenum is normally positioned as is the ligament of Treitz. No small bowel wall thickening. No small bowel dilatation. The terminal ileum is normal. The appendix is normal. No gross colonic mass. No colonic wall thickening. Vascular/Lymphatic: No abdominal aortic aneurysm. No abdominal aortic atherosclerotic calcification. There is no gastrohepatic or hepatoduodenal ligament lymphadenopathy. No intraperitoneal or retroperitoneal lymphadenopathy. No pelvic sidewall lymphadenopathy. Reproductive: The prostate gland and seminal vesicles have normal imaging features. Other: No intraperitoneal free fluid. Musculoskeletal: No worrisome lytic or sclerotic osseous abnormality. IMPRESSION: 1. Extensive airspace disease in the lung bases compatible with pneumonia. 2. No acute  findings in the abdomen or pelvis. Electronically Signed   By: Kennith Center M.D.   On: 02/09/2018 19:44   Dg Chest Port 1 View  Result Date: 02/10/2018 CLINICAL DATA:  47 year old male with possible sepsis and pneumonia. EXAM: PORTABLE CHEST 1 VIEW COMPARISON:  Chest radiograph dated 02/09/2018 FINDINGS: Bilateral airspace opacities, right greater left similar or slightly worsened since the prior radiograph. No large pleural effusion. No pneumothorax. The cardiac silhouette is within normal limits. No acute osseous pathology. IMPRESSION: Bilateral airspace opacities, right greater left, similar or slightly worsened since the prior radiograph. Electronically Signed   By: Burtis Junes  Radparvar M.D.   On: 02/10/2018 06:38   Dg Chest Port 1 View  Result Date: 02/09/2018 CLINICAL DATA:  Nausea/vomiting, body aches, fever, bloody diarrhea, abdominal pain EXAM: PORTABLE CHEST 1 VIEW COMPARISON:  07/10/2011 FINDINGS: Multifocal patchy opacities in the right upper lobe, right lower lobe, and left upper lobe/lingula. Despite the perihilar distribution (raising the possibility of interstitial edema), multifocal pneumonia is favored. There are no definite pleural effusions. No pneumothorax. The heart is normal in size. IMPRESSION: Multifocal patchy opacities in the bilateral upper lobes and right lower lobe, favoring multifocal pneumonia, less likely interstitial edema. No definite pleural effusions. Electronically Signed   By: Charline BillsSriyesh  Krishnan M.D.   On: 02/09/2018 19:17   Dg Abd Portable 1 View  Result Date: 02/09/2018 CLINICAL DATA:  Nausea/vomiting, abdominal pain, diarrhea, fever EXAM: PORTABLE ABDOMEN - 1 VIEW COMPARISON:  None. FINDINGS: Nonobstructive bowel gas pattern. Visualized osseous structures are within normal limits. IMPRESSION: Unremarkable abdominal radiograph. Electronically Signed   By: Charline BillsSriyesh  Krishnan M.D.   On: 02/09/2018 19:18    Time Spent in minutes  30   Susa RaringPrashant Skyler Carel M.D on 02/10/2018  at 9:56 AM  To page go to [email or web address] - password [email or web address]

## 2018-02-10 NOTE — Consult Note (Addendum)
NAME:  Roberto Knapp, MRN:  [id number], DOB:  12-31-1970, LOS: 1 ADMISSION DATE:  02/09/2018, CONSULTATION DATE:  12/9 REFERRING MD:  Candiss Norse, CHIEF COMPLAINT:  Acute hypoxic respiratory failure    Brief History   25 yom admitted 12/8 w/ CAP, probable gastritis (from NSAIDS) and progressive hypoxic resp failure   History of present illness   47 year old male presented to ED 12/8 w/ cc: fever, body ache, abd pain, N/V, melena, BRBPR, cough and SOB. Initial onset about 10d prior. Initial symptoms were the fever, N/V/D,  body ache, cough shortness of breath for which he was taking advil about every 4 hours, the melena and BRBPR started a few days later. He reported the abd pain as epigastric in nature.  In ER: he was febrile, tachycardic,his RR was in 30s, wbc 18.9, CXR showed marked R>L airspace disease. CT abd/pelvis was neg. FOB pos, influenza PCR neg, UDS was positive for THC. He was admitted to IM service. Started on supplemental oxygen and abx. His respiratory status continued to worsen and by am 12/9 he was titrated up to 100 % on BIPAP w/ marked accessory use and RR in excess of 35 BPM. His HR on tele was in 160s in atrial fib. PCCM asked to see.  Past Medical History  smoker  Significant Hospital Events   12/8 admitted w/ working dx PNA, GIB (prb 2/2 NSAIDs) and acute resp failure 12/9 PCCM called. Resp status progressed. 100% BIPAP. R?L infiltrate on CXR  Consults:  PCCM   Procedures:  OETT 12/9  Significant Diagnostic Tests:    Micro Data:  Influenza PCR 12/8: neg Strep antigen 12/8: neg Legionella antigen 12/8>>> Respiratory culture 12/9>>> BCX2 12/9>>> cdiff 12/9: neg GI Panel 12/9>>>  Antimicrobials:  Zosyn 12/9>>> Vanc 12/8>>> azith 12/9>>>  Interim history/subjective:  Critically ill now in ICU   Objective   Blood pressure 126/75, pulse (Abnormal) 142, temperature (Abnormal) 103.5 F (39.7 C), temperature source Rectal, resp. rate (Abnormal) 29, height _0   (1.753 m), weight 71.4 kg, SpO2 95 %.    FiO2 (%):  [70 %] 70 %   Intake/Output Summary (Last 24 hours) at 02/10/2018 1234 Last data filed at 02/10/2018 1006 Gross per 24 hour  Intake 1800 ml  Output 350 ml  Net 1450 ml   Filed Weights   02/09/18 1701 02/10/18 0147  Weight: 65.8 kg 71.4 kg    Examination: General: acutely ill white male anxious w/ marked accessory use  HENT: NCAT, MM dry  Lungs: diffuse rales and rhonchi Cardiovascular: tachy RRR (afib on tele) Abdomen: soft not tender + bowel sounds  Extremities: warm and dry, brisk CR no edema  Neuro: awake and alert, anxious GU: cl yellow   Resolved Hospital Problem list     Assessment & Plan:  Acute Hypoxic respiratory failure in setting of CAP vs aspiration (favor CAP) w/ evolving ARDS Plan Intubate/ventilate ARDS protocol  FOB; will send viral panel and BAL PAD protocol RASS goal -3 VAP bundle  Day 2 vanc, zosyn and azith  Severe sepsis Plan Cont IVFs F/u lactate abx as above  afib w/ RVR; currently has perfusing BP Plan Amiodarone bolus and gtt Tele  Cycle CEs Hold off on AC given GIB  GIB: Suspect this 2/2 NSAIDS Plan Cont PPI BID  NPO  Fluid and electrolyte imbalance: hyponatremia, hypokalemia  Plan Cont NS IVFs Replace K Am chemistry   Mild LFT elevation Plan  F/u am LFTs    Best practice:  Diet: NPO Pain/Anxiety/Delirium protocol (if indicated): 12/9 VAP protocol (if indicated): 12/9 DVT prophylaxis: [place] heparin  GI prophylaxis: PPI BID Glucose control: na Mobility: BR Code Status: full code  Family Communication: updated  Disposition:   Labs   CBC: Recent Labs  Lab 02/09/18 1725 02/09/18 1757 02/09/18 2124 02/10/18 0540  WBC 18.8*  --   --  18.2*  NEUTROABS 17.7*  --   --   --   HGB 11.6* 12.2* 10.3* 10.6*  HCT 33.7* 36.0* 31.2* 31.2*  MCV 88.5  --   --  89.9  PLT 217  --   --  149*    Basic Metabolic Panel: Recent Labs  Lab 02/09/18 1725 02/09/18 1757  02/09/18 2124 02/10/18 0540  NA 121* 124* 129* 131*  K 3.0* 3.1* 3.5 3.4*  CL 92* 93* 99 101  CO2 19*  --  17* 20*  GLUCOSE 105* 98 84 122*  BUN 23* 26* 23* 17  CREATININE 1.20 1.30* 1.14 1.22  CALCIUM 7.4*  --  6.8* 7.2*  MG 1.9  --   --   --    GFR: Estimated Creatinine Clearance: 73.2 mL/min (by C-G formula based on SCr of 1.22 mg/dL). Recent Labs  Lab 02/09/18 1725 02/09/18 1758 02/09/18 2014 02/09/18 2124 02/10/18 0540 02/10/18 0759  PROCALCITON  --   --   --  29.93 31.41  --   WBC 18.8*  --   --   --  18.2*  --   LATICACIDVEN  --  1.74 1.33  --  1.8 1.5    Liver Function Tests: Recent Labs  Lab 02/09/18 1725 02/10/18 0540  AST 136* 115*  ALT 57* 46*  ALKPHOS 90 79  BILITOT 1.2 1.0  PROT 5.6* 5.1*  ALBUMIN 1.7* 1.5*   Recent Labs  Lab 02/09/18 1725  LIPASE 21   No results for input(s): AMMONIA in the last 168 hours.  ABG    Component Value Date/Time   PHART 7.330 (L) 02/10/2018 0538   PCO2ART 38.4 02/10/2018 0538   PO2ART 80.2 (L) 02/10/2018 0538   HCO3 19.7 (L) 02/10/2018 0538   TCO2 23 02/09/2018 1757   ACIDBASEDEF 5.2 (H) 02/10/2018 0538   O2SAT 95.8 02/10/2018 0538     Coagulation Profile: Recent Labs  Lab 02/09/18 1725  INR 1.14    Cardiac Enzymes: Recent Labs  Lab 02/09/18 2124  TROPONINI 0.05*    HbA1C: No results found for: HGBA1C  CBG: Recent Labs  Lab 02/10/18 0801  GLUCAP 93    Review of Systems:   Not able   Past Medical History  He,  has a past medical history of Drug overdose, intentional ([place]).   Surgical History    Past Surgical History:  Procedure Laterality Date  . FASCIOTOMY  09/08/2011   Procedure: FASCIOTOMY;  Surgeon: Elam Dutch, MD;  Location: [place];  Service: Vascular;  Laterality: Left;  . FEMORAL-POPLITEAL BYPASS GRAFT  09/08/2011   Procedure: BYPASS GRAFT FEMORAL-POPLITEAL ARTERY;  Surgeon: Elam Dutch, MD;  Location: [facility];  Service: Vascular;  Laterality: Left;  . NO PAST  SURGERIES       Social History   reports that he has been smoking cigarettes. He has a 12.50 pack-year smoking history. He has never used smokeless tobacco. He reports that he drinks alcohol. He reports that he does not use drugs.   Family History   His family history includes Hypertension in his father.   Allergies No Known Allergies  Home Medications  Prior to Admission medications   Medication Sig Start Date End Date Taking? Authorizing Provider  Acetaminophen (TYLENOL PO) Take 2 tablets by mouth every 6 (six) hours as needed (pain/fever/headache).   Yes [provider]  ibuprofen (ADVIL,MOTRIN) 200 MG tablet Take 400 mg by mouth every 6 (six) hours as needed for fever, headache, mild pain, moderate pain or cramping.   Yes [provider]  lisdexamfetamine (VYVANSE) 50 MG capsule Take 50 mg by mouth daily.   Yes [provider]     Critical care time: 34 min     Erick Colace ACNP-BC [place] Pager # [phone] OR # [phone] if no answer  Attending Note:  47 year old male with PMH above presenting to PCCM with PNA and hypoxemic respiratory failure.  On exam, he is tachypnic and hypoxemic on BiPAP with decreased BS diffusely.  I reviewed CXR myself, bilateral infiltrate noted.  Discussed with PCCM-NP.  Will intubate, mechanically ventilate, place on the ARDS protocol.  Broad spectrum abx as above.  Bronchoscopy performed and washed with cultures sent and cytology.  PCCM will assume service.    The patient is critically ill with multiple organ systems failure and requires high complexity decision making for assessment and support, frequent evaluation and titration of therapies, application of advanced monitoring technologies and extensive interpretation of multiple databases.   Critical Care Time devoted to patient care services described in this note is  90  Minutes. This time reflects time of care of this signee Dr Jennet Maduro.  This critical care time does not reflect procedure time, or teaching time or supervisory time of PA/NP/Med student/Med Resident etc but could involve care discussion time.  Rush Farmer, M.D. [facility] Pulmonary/Critical Care Medicine. Pager: [phone]. After hours pager: [phone].

## 2018-02-10 NOTE — Progress Notes (Signed)
Called to the bedside by RRT. Pt continues to desat, increased WOB and SOB worsening. On asessment, pt has rhonchi and wheezes throughout all lung fields. Currently sats 93-96% on non-rebreather. He is A/Ox3, tachypneic, tachycardiac, normotensive. Pt with complaints of worsening abdominal pain/discomfort.   Sepsis secondary to multifocal PNA - Xopenex q6prn for wheezing - add'l CXR, ABG -Trend Lactic Acid, CBC - Pt has had no episodes of vomiting since admission. Will place on bipap machine at this time and continue to monitor.   Roberto MuscatXenia Sherea Liptak, NP [facility] 7p-7a [phone]

## 2018-02-11 ENCOUNTER — Inpatient Hospital Stay (HOSPITAL_COMMUNITY): Payer: BLUE CROSS/BLUE SHIELD

## 2018-02-11 DIAGNOSIS — R0602 Shortness of breath: Secondary | ICD-10-CM

## 2018-02-11 DIAGNOSIS — J80 Acute respiratory distress syndrome: Secondary | ICD-10-CM

## 2018-02-11 LAB — BASIC METABOLIC PANEL
Anion gap: 15 (ref 5–15)
BUN: 35 mg/dL — ABNORMAL HIGH (ref 6–20)
CO2: 16 mmol/L — ABNORMAL LOW (ref 22–32)
Calcium: 6.8 mg/dL — ABNORMAL LOW (ref 8.9–10.3)
Chloride: 102 mmol/L (ref 98–111)
Creatinine, Ser: 3.3 mg/dL — ABNORMAL HIGH (ref 0.61–1.24)
GFR calc Af Amer: 24 mL/min — ABNORMAL LOW (ref >60)
GFR calc non Af Amer: 21 mL/min — ABNORMAL LOW (ref >60)
Glucose, Bld: 98 mg/dL (ref 70–99)
Potassium: 4.2 mmol/L (ref 3.5–5.1)
Sodium: 133 mmol/L — ABNORMAL LOW (ref 135–145)

## 2018-02-11 LAB — ECHOCARDIOGRAM COMPLETE
Height: 68 in
Weight: 2931.24 oz

## 2018-02-11 LAB — POCT I-STAT 3, ART BLOOD GAS (G3+)
Acid-base deficit: 13 mmol/L — ABNORMAL HIGH (ref 0.0–2.0)
Acid-base deficit: 13 mmol/L — ABNORMAL HIGH (ref 0.0–2.0)
Acid-base deficit: 13 mmol/L — ABNORMAL HIGH (ref 0.0–2.0)
Acid-base deficit: 12 mmol/L — ABNORMAL HIGH (ref 0.0–2.0)
Bicarbonate: 17.8 mmol/L — ABNORMAL LOW (ref 20.0–28.0)
Bicarbonate: 17.9 mmol/L — ABNORMAL LOW (ref 20.0–28.0)
Bicarbonate: 16.9 mmol/L — ABNORMAL LOW (ref 20.0–28.0)
Bicarbonate: 17.6 mmol/L — ABNORMAL LOW (ref 20.0–28.0)
O2 Saturation: 67 %
O2 Saturation: 75 %
O2 Saturation: 85 %
O2 Saturation: 85 %
Patient temperature: 100.3
TCO2: 20 mmol/L — ABNORMAL LOW (ref 22–32)
TCO2: 20 mmol/L — ABNORMAL LOW (ref 22–32)
TCO2: 19 mmol/L — ABNORMAL LOW (ref 22–32)
TCO2: 19 mmol/L — ABNORMAL LOW (ref 22–32)
pCO2 arterial: 64.5 mmHg — ABNORMAL HIGH (ref 32.0–48.0)
pCO2 arterial: 65 mmHg — ABNORMAL HIGH (ref 32.0–48.0)
pCO2 arterial: 58.7 mmHg — ABNORMAL HIGH (ref 32.0–48.0)
pCO2 arterial: 55.8 mmHg — ABNORMAL HIGH (ref 32.0–48.0)
pH, Arterial: 7.048 — CL (ref 7.350–7.450)
pH, Arterial: 7.047 — CL (ref 7.350–7.450)
pH, Arterial: 7.073 — CL (ref 7.350–7.450)
pH, Arterial: 7.107 — CL (ref 7.350–7.450)
pO2, Arterial: 50 mmHg — ABNORMAL LOW (ref 83.0–108.0)
pO2, Arterial: 58 mmHg — ABNORMAL LOW (ref 83.0–108.0)
pO2, Arterial: 73 mmHg — ABNORMAL LOW (ref 83.0–108.0)
pO2, Arterial: 68 mmHg — ABNORMAL LOW (ref 83.0–108.0)

## 2018-02-11 LAB — MAGNESIUM: Magnesium: 2.5 mg/dL — ABNORMAL HIGH (ref 1.7–2.4)

## 2018-02-11 LAB — GLUCOSE, CAPILLARY
Glucose-Capillary: 92 mg/dL (ref 70–99)
Glucose-Capillary: 78 mg/dL (ref 70–99)
Glucose-Capillary: 68 mg/dL — ABNORMAL LOW (ref 70–99)
Glucose-Capillary: 88 mg/dL (ref 70–99)
Glucose-Capillary: 100 mg/dL — ABNORMAL HIGH (ref 70–99)

## 2018-02-11 LAB — CBC
HCT: 34.6 % — ABNORMAL LOW (ref 39.0–52.0)
Hemoglobin: 11 g/dL — ABNORMAL LOW (ref 13.0–17.0)
MCH: 30.6 pg (ref 26.0–34.0)
MCHC: 31.8 g/dL (ref 30.0–36.0)
MCV: 96.1 fL (ref 80.0–100.0)
Platelets: 272 10*3/uL (ref 150–400)
RBC: 3.6 MIL/uL — ABNORMAL LOW (ref 4.22–5.81)
RDW: 15.6 % — ABNORMAL HIGH (ref 11.5–15.5)
WBC: 26.6 10*3/uL — ABNORMAL HIGH (ref 4.0–10.5)
nRBC: 0 % (ref 0.0–0.2)

## 2018-02-11 LAB — BLOOD GAS, ARTERIAL
Acid-base deficit: 9.6 mmol/L — ABNORMAL HIGH (ref 0.0–2.0)
Bicarbonate: 17.5 mmol/L — ABNORMAL LOW (ref 20.0–28.0)
Drawn by: 10006
FIO2: 100
MECHVT: 410 mL
O2 Saturation: 96.1 %
PEEP: 18 cmH2O
Patient temperature: 100
RATE: 35 resp/min
pCO2 arterial: 52.5 mmHg — ABNORMAL HIGH (ref 32.0–48.0)
pH, Arterial: 7.154 — CL (ref 7.350–7.450)
pO2, Arterial: 89.4 mmHg (ref 83.0–108.0)

## 2018-02-11 LAB — COMPREHENSIVE METABOLIC PANEL
ALT: 35 U/L (ref 0–44)
AST: 110 U/L — ABNORMAL HIGH (ref 15–41)
Albumin: 1.2 g/dL — ABNORMAL LOW (ref 3.5–5.0)
Alkaline Phosphatase: 76 U/L (ref 38–126)
Anion gap: 10 (ref 5–15)
BUN: 23 mg/dL — ABNORMAL HIGH (ref 6–20)
CO2: 19 mmol/L — ABNORMAL LOW (ref 22–32)
Calcium: 7 mg/dL — ABNORMAL LOW (ref 8.9–10.3)
Chloride: 106 mmol/L (ref 98–111)
Creatinine, Ser: 1.8 mg/dL — ABNORMAL HIGH (ref 0.61–1.24)
GFR calc Af Amer: 50 mL/min — ABNORMAL LOW (ref >60)
GFR calc non Af Amer: 43 mL/min — ABNORMAL LOW (ref >60)
Glucose, Bld: 86 mg/dL (ref 70–99)
Potassium: 4.4 mmol/L (ref 3.5–5.1)
Sodium: 135 mmol/L (ref 135–145)
Total Bilirubin: 1 mg/dL (ref 0.3–1.2)
Total Protein: 4.7 g/dL — ABNORMAL LOW (ref 6.5–8.1)

## 2018-02-11 LAB — PROCALCITONIN: Procalcitonin: 49.28 ng/mL

## 2018-02-11 LAB — PNEUMOCYSTIS JIROVECI SMEAR BY DFA: Pneumocystis jiroveci Ag: NEGATIVE

## 2018-02-11 LAB — LEGIONELLA PNEUMOPHILA SEROGP 1 UR AG: L. pneumophila Serogp 1 Ur Ag: POSITIVE — AB

## 2018-02-11 IMAGING — DX DG CHEST 1V PORT
1 series · 1 of 1 positions shown · non-contrast
Comparison: Yesterday

CLINICAL DATA: Acute respiratory failure with hypoxia

EXAM:
PORTABLE CHEST 1 VIEW

[chest ap]
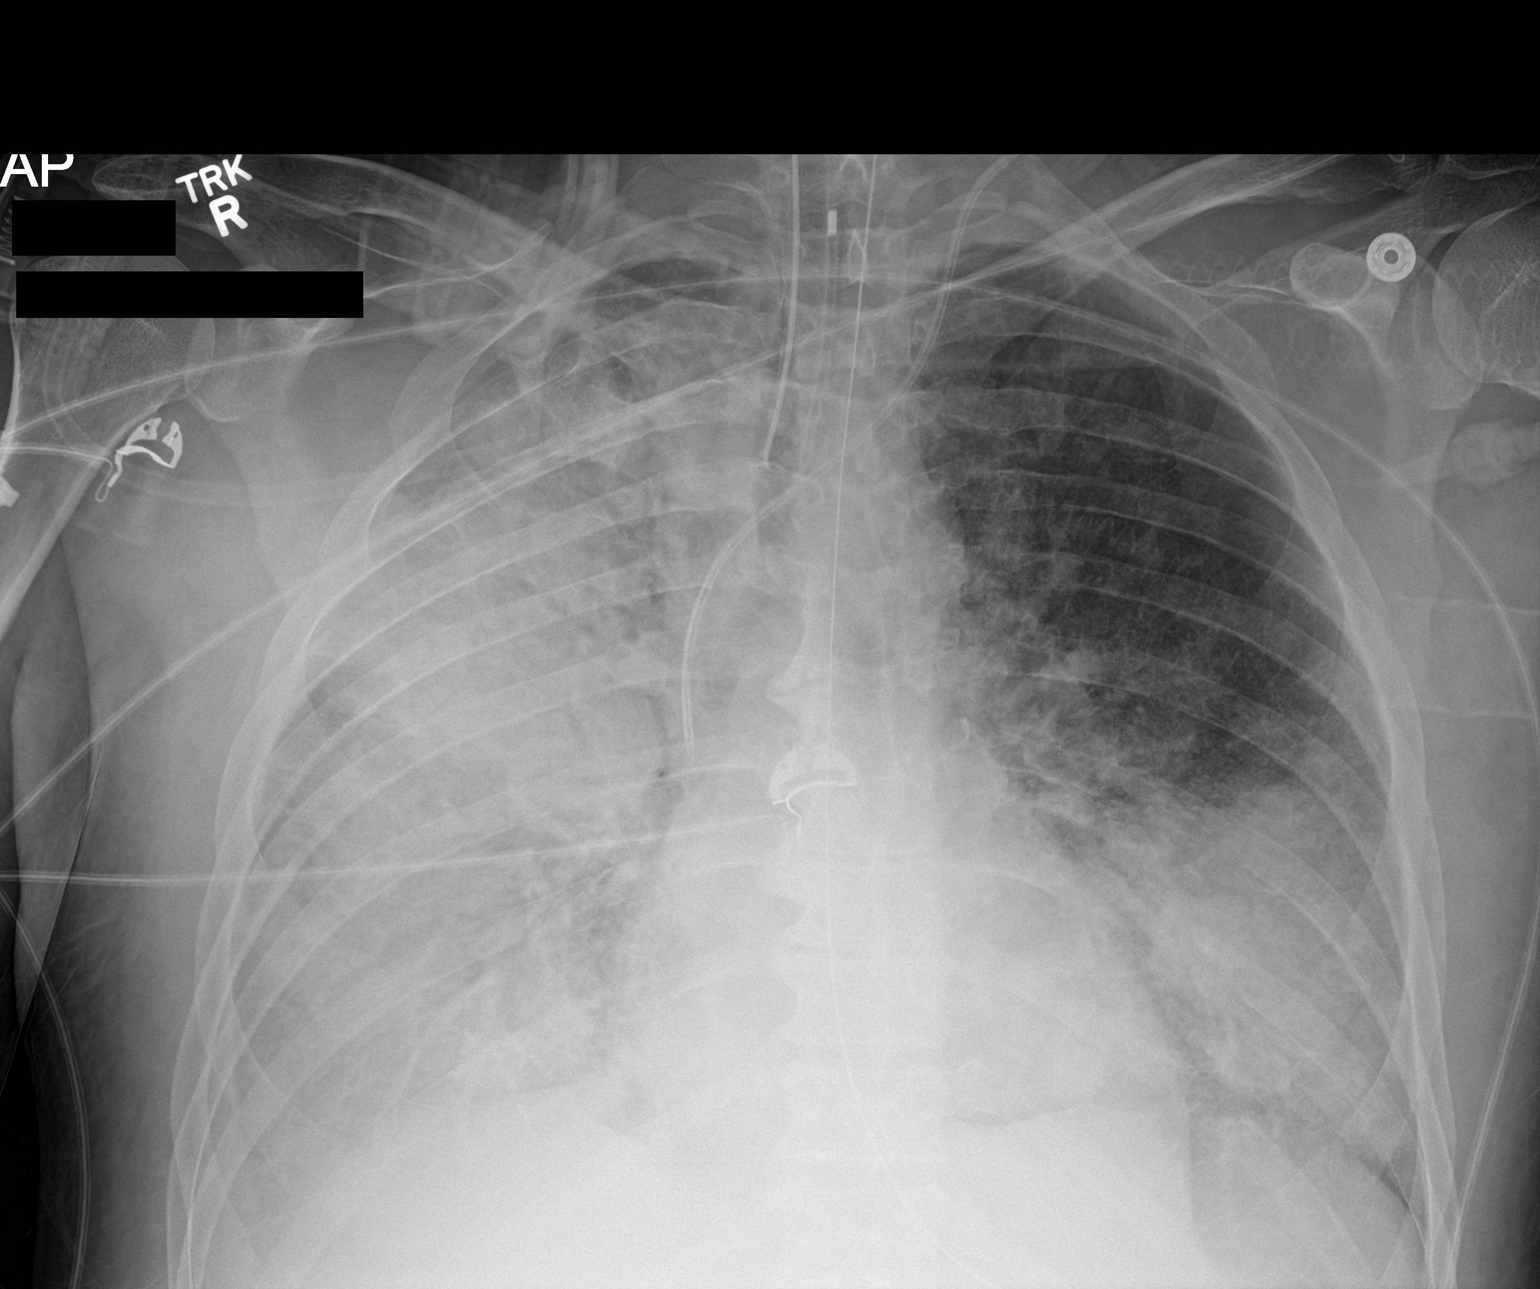

[1 of 1 positions shown; findings below may reference images not displayed]

FINDINGS: Extensive airspace disease asymmetric to the right. There is
reported history of pneumonia and sepsis. Normal heart size. No
effusion or pneumothorax. Endotracheal tube with tip between the
clavicular heads and carina. Left IJ line with tip at the upper
cavoatrial junction. An orogastric tube at least reaches the
stomach.
IMPRESSION: Stable hardware positioning and severe bilateral airspace disease.

## 2018-02-11 MED ORDER — FENTANYL 2500MCG IN NS 250ML (10MCG/ML) PREMIX INFUSION
25.0000 ug/h | INTRAVENOUS | Status: DC
  Administered 2018-02-11: 300 ug/h via INTRAVENOUS
  Administered 2018-02-12: 400 ug/h via INTRAVENOUS
  Administered 2018-02-12: 300 ug/h via INTRAVENOUS
  Administered 2018-02-12: 200 ug/h via INTRAVENOUS
  Administered 2018-02-13 (×3): 300 ug/h via INTRAVENOUS
  Administered 2018-02-14: 400 ug/h via INTRAVENOUS
  Administered 2018-02-14: 350 ug/h via INTRAVENOUS
  Administered 2018-02-14 (×2): 400 ug/h via INTRAVENOUS
  Administered 2018-02-15 (×2): 350 ug/h via INTRAVENOUS
  Administered 2018-02-15 – 2018-02-16 (×2): 300 ug/h via INTRAVENOUS
  Administered 2018-02-16: 325 ug/h via INTRAVENOUS
  Administered 2018-02-16: 350 ug/h via INTRAVENOUS
  Administered 2018-02-17 (×3): 325 ug/h via INTRAVENOUS
  Administered 2018-02-18: 275 ug/h via INTRAVENOUS
  Administered 2018-02-18: 350 ug/h via INTRAVENOUS
  Administered 2018-02-19: 200 ug/h via INTRAVENOUS
  Filled 2018-02-11 (×23): qty 250

## 2018-02-11 MED ORDER — FUROSEMIDE 10 MG/ML IJ SOLN
80.0000 mg | Freq: Two times a day (BID) | INTRAMUSCULAR | Status: AC
  Administered 2018-02-11 – 2018-02-12 (×2): 80 mg via INTRAVENOUS
  Filled 2018-02-11 (×2): qty 8

## 2018-02-11 MED ORDER — SODIUM BICARBONATE 8.4 % IV SOLN
INTRAVENOUS | Status: DC
  Administered 2018-02-11 – 2018-02-13 (×6): via INTRAVENOUS
  Filled 2018-02-11 (×8): qty 150

## 2018-02-11 MED ORDER — VANCOMYCIN HCL 10 G IV SOLR: 1500.0000 mg | INTRAVENOUS | Status: DC

## 2018-02-11 MED ORDER — SODIUM CHLORIDE 0.9 % IV SOLN
0.5000 ug/kg/min | INTRAVENOUS | Status: DC
  Filled 2018-02-11 (×3): qty 20

## 2018-02-11 MED ORDER — AMIODARONE LOAD VIA INFUSION
150.0000 mg | Freq: Once | INTRAVENOUS | Status: AC
  Administered 2018-02-11: 150 mg via INTRAVENOUS
  Filled 2018-02-11: qty 83.34

## 2018-02-11 MED ORDER — INFLUENZA VAC SPLIT QUAD 0.5 ML IM SUSY
0.5000 mL | PREFILLED_SYRINGE | INTRAMUSCULAR | Status: DC
  Filled 2018-02-11: qty 0.5

## 2018-02-11 MED ORDER — AMIODARONE IV BOLUS ONLY 150 MG/100ML: 150.0000 mg | Freq: Once | INTRAVENOUS | Status: DC

## 2018-02-11 NOTE — Progress Notes (Signed)
  Echocardiogram 2D Echocardiogram has been performed.  Roberto Knapp 02/11/2018, 2:51 PM

## 2018-02-11 NOTE — Progress Notes (Signed)
NAME:  Roberto Knapp, MRN:  [id number], DOB:  1971-01-19, LOS: 2 ADMISSION DATE:  02/09/2018, CONSULTATION DATE:  12/9 REFERRING MD:  Candiss Norse, CHIEF COMPLAINT:  Acute hypoxic respiratory failure    Brief History   55 yom admitted 12/8 w/ CAP, probable gastritis (from NSAIDS) and progressive hypoxic resp failure   History of present illness   47 year old male presented to ED 12/8 w/ cc: fever, body ache, abd pain, N/V, melena, BRBPR, cough and SOB. Initial onset about 10d prior. Initial symptoms were the fever, N/V/D,  body ache, cough shortness of breath for which he was taking advil about every 4 hours, the melena and BRBPR started a few days later. He reported the abd pain as epigastric in nature.  In ER: he was febrile, tachycardic,his RR was in 30s, wbc 18.9, CXR showed marked R>L airspace disease. CT abd/pelvis was neg. FOB pos, influenza PCR neg, UDS was positive for THC. He was admitted to IM service. Started on supplemental oxygen and abx. His respiratory status continued to worsen and by am 12/9 he was titrated up to 100 % on BIPAP w/ marked accessory use and RR in excess of 35 BPM. His HR on tele was in 160s in atrial fib. PCCM asked to see.   Past Medical History  smoker  Significant Hospital Events   12/8 admitted w/ working dx PNA, GIB (prb 2/2 NSAIDs) and acute resp failure 12/9 PCCM called. Resp status progressed. 100% BIPAP. R?L infiltrate on CXR 5/9 paralytics and prone positioning  Consults:  PCCM   Procedures:  OETT 12/9 >>  Art line 12/9 >>  L IJ HD catheter 12/9 >>   Significant Diagnostic Tests:  CT abdomen/pelvis 12/8 >> extensive bilateral basilar airspace disease, no acute findings in the abdomen or pelvis  Micro Data:  Influenza PCR 12/8: neg Strep antigen 12/8: neg Legionella antigen 12/8>>> Respiratory culture 12/9>>> BCX2 12/9>>> Urine 12/8 >> negative BAL 12/9 >>  Pneumocystis DFA 12/9 >>  cdiff 12/9: neg GI Panel 12/9>>> negative  Antimicrobials:   Zosyn 12/9>>> Vanc 12/8>>> azith 12/9>>>  Interim history/subjective:  Patient in prone position approximately 1800 12/9 Has had desaturations with any manipulation including even head turning, recruitment performed Atrial fibrillation, on amiodarone, bolus given overnight Continued fever, temperature 103.8 overnight  Objective   Blood pressure (!) 74/51, pulse (!) 113, temperature (!) 100.9 F (38.3 C), resp. rate (!) 30, height _0  (1.727 m), weight 83.1 kg, SpO2 (!) 85 %. CVP:  [16 mmHg] 16 mmHg  Vent Mode: PRVC FiO2 (%):  [70 %-100 %] 100 % Set Rate:  [30 bmp] 30 bmp Vt Set:  [410 mL] 410 mL PEEP:  [15 cmH20] 15 cmH20 Plateau Pressure:  [26 cmH20-33 cmH20] 33 cmH20   Intake/Output Summary (Last 24 hours) at 02/11/2018 0852 Last data filed at 02/11/2018 0758 Gross per 24 hour  Intake 6943.37 ml  Output 1685 ml  Net 5258.37 ml   Filed Weights   02/09/18 1701 02/10/18 0147 02/11/18 0600  Weight: 65.8 kg 71.4 kg 83.1 kg    Examination: General: Critically ill, in the prone position HENT: Endotracheal tube appears to be in good position, no current evidence of facial skin injury Lungs: Diffuse rhonchi bilaterally, left-sided inspiratory squeak Cardiovascular: Irregularly irregular, tachycardic Abdomen: Difficult to examine in prone position, bowel sounds are positive Extremities: No significant edema Neuro: Sedated and paralyzed GU: Foley catheter in place  Resolved Hospital Problem list     Assessment & Plan:  Acute Hypoxic respiratory failure in setting of CAP vs aspiration (favor CAP) w/  ARDS.  No history of vaping in the last year per his wife Plan Ventilation per ARDS protocol Paralytics, currently in prone position.  16-hour mark is around 10:00 this morning, plan to go back to the supine position Antibiotics: Vancomycin, azithromycin, Zosyn Follow culture data especially BAL results May need to strongly consider transfer for possible ECMO depending on  course 12/10  Shock, presumed septic shock Plan Continue phenylephrine as ordered, wean as able Antibiotics as above  afib w/ RVR; currently has perfusing BP Plan Continue amiodarone infusion as ordered, given his high FiO2 needs concern for potential pulmonary toxicity Anticoagulation deferred given GI bleeding on presentation  Acute renal failure, nonoliguric Plan Follow urine output and BMP closely Avoid nephrotoxins  GIB: Suspect this 2/2 NSAIDS Plan Continue pantoprazole twice daily Hold off on nutrition at this time, consider going forward NPO  Fluid and electrolyte imbalance: hyponatremia, hypokalemia  Plan Continue current IV fluids Follow BMP and replace electrolytes as indicated  Mild LFT elevation Plan  Stable on follow-up, follow LFT intermittently    Best practice:  Diet: NPO Pain/Anxiety/Delirium protocol (if indicated): 12/9 VAP protocol (if indicated): 12/9 DVT prophylaxis: [place] heparin  GI prophylaxis: PPI BID Glucose control: na Mobility: BR Code Status: full code  Family Communication: updated  Disposition:   Labs   CBC: Recent Labs  Lab 02/09/18 1725  02/09/18 2124 02/10/18 0540 02/10/18 1144 02/10/18 2118 02/11/18 0415  WBC 18.8*  --   --  18.2*  --   --  26.6*  NEUTROABS 17.7*  --   --   --   --   --   --   HGB 11.6*   < > 10.3* 10.6* 11.1* 10.5* 11.0*  HCT 33.7*   < > 31.2* 31.2* 33.0* 33.3* 34.6*  MCV 88.5  --   --  89.9  --   --  96.1  PLT 217  --   --  149*  --   --  272   < > = values in this interval not displayed.    Basic Metabolic Panel: Recent Labs  Lab 02/09/18 1725 02/09/18 1757 02/09/18 2124 02/10/18 0540 02/11/18 0152  NA 121* 124* 129* 131* 135  K 3.0* 3.1* 3.5 3.4* 4.4  CL 92* 93* 99 101 106  CO2 19*  --  17* 20* 19*  GLUCOSE 105* 98 84 122* 86  BUN 23* 26* 23* 17 23*  CREATININE 1.20 1.30* 1.14 1.22 1.80*  CALCIUM 7.4*  --  6.8* 7.2* 7.0*  MG 1.9  --   --   --  2.5*   GFR: Estimated Creatinine  Clearance: 52.2 mL/min (A) (by C-G formula based on SCr of 1.8 mg/dL (H)). Recent Labs  Lab 02/09/18 1725 02/09/18 1758 02/09/18 2014 02/09/18 2124 02/10/18 0540 02/10/18 0759 02/11/18 0152 02/11/18 0415  PROCALCITON  --   --   --  29.93 31.41  --  49.28  --   WBC 18.8*  --   --   --  18.2*  --   --  26.6*  LATICACIDVEN  --  1.74 1.33  --  1.8 1.5  --   --     Liver Function Tests: Recent Labs  Lab 02/09/18 1725 02/10/18 0540 02/11/18 0152  AST 136* 115* 110*  ALT 57* 46* 35  ALKPHOS 90 79 76  BILITOT 1.2 1.0 1.0  PROT 5.6* 5.1* 4.7*  ALBUMIN 1.7* 1.5*  1.2*   Recent Labs  Lab 02/09/18 1725  LIPASE 21   No results for input(s): AMMONIA in the last 168 hours.  ABG    Component Value Date/Time   PHART 7.301 (L) 02/10/2018 1523   PCO2ART 39.9 02/10/2018 1523   PO2ART 64.0 (L) 02/10/2018 1523   HCO3 19.7 (L) 02/10/2018 1523   TCO2 21 (L) 02/10/2018 1523   ACIDBASEDEF 6.0 (H) 02/10/2018 1523   O2SAT 90.0 02/10/2018 1523     Coagulation Profile: Recent Labs  Lab 02/09/18 1725  INR 1.14    Cardiac Enzymes: Recent Labs  Lab 02/09/18 2124  TROPONINI 0.05*    HbA1C: No results found for: HGBA1C  CBG: Recent Labs  Lab 02/10/18 1548 02/10/18 1935 02/10/18 2328 02/11/18 0413 02/11/18 0744  GLUCAP 97 105* 87 92 78    Review of Systems:   Not able   Past Medical History  He,  has a past medical history of Drug overdose, intentional ([place]).   Surgical History    Past Surgical History:  Procedure Laterality Date  . FASCIOTOMY  09/08/2011   Procedure: FASCIOTOMY;  Surgeon: Elam Dutch, MD;  Location: [place];  Service: Vascular;  Laterality: Left;  . FEMORAL-POPLITEAL BYPASS GRAFT  09/08/2011   Procedure: BYPASS GRAFT FEMORAL-POPLITEAL ARTERY;  Surgeon: Elam Dutch, MD;  [facility] OR;  Service: Vascular;  Laterality: Left;  . NO PAST SURGERIES       Social History   reports that he has been smoking cigarettes. He has a 12.50 pack-year  smoking history. He has never used smokeless tobacco. He reports that he drinks alcohol. He reports that he does not use drugs.   Family History   His family history includes Hypertension in his father.   Allergies No Known Allergies   Home Medications  Prior to Admission medications   Medication Sig Start Date End Date Taking? Authorizing Provider  Acetaminophen (TYLENOL PO) Take 2 tablets by mouth every 6 (six) hours as needed (pain/fever/headache).   Yes [provider]  ibuprofen (ADVIL,MOTRIN) 200 MG tablet Take 400 mg by mouth every 6 (six) hours as needed for fever, headache, mild pain, moderate pain or cramping.   Yes [provider]  lisdexamfetamine (VYVANSE) 50 MG capsule Take 50 mg by mouth daily.   Yes [provider]     Critical care time: 24 min    Baltazar Apo, MD, PhD 02/11/2018, 9:24 AM [facility] [phone] or if no answer [phone]

## 2018-02-11 NOTE — Progress Notes (Signed)
eLink Physician-Brief Progress Note Patient Name: Roberto BussingGary W Yiu DOB: [birth year] MRN: [id number]   Date of Service  02/11/2018  HPI/Events of Note  Multiple issues: 1. AFIB with RVR - Ventricular rate = 144 and 2. Temp = 103.8 in spite of Tylenol. Currently on an Amiodarone IV infusion.  eICU Interventions  Will order: 1. Amiodarone bolus 150 mg IV over 10 minutes now.  2. Send AM labs now. 3. Cooling blanket.      Intervention Category Major Interventions: Arrhythmia - evaluation and management  Sommer,Steven Eugene 02/11/2018, 1:25 AM

## 2018-02-11 NOTE — Progress Notes (Signed)
Patients head turned to right with sats dropping to 85%. Recruitment performed with improvement of sats to 94%. Sats now 90%. Will continue to monitor.

## 2018-02-11 NOTE — Progress Notes (Addendum)
eLink Physician-Brief Progress Note Patient Name: Roberto BussingGary W Ridgeway DOB: [birth year] MRN: [id number]   Date of Service  02/11/2018  HPI/Events of Note  Reviewed ABG which is much better 7.154/52.5/89.4 <-- 7.107/55.8/68.  Pt is getting HCO3 gtt at 12725ml/hr. Pt still with no urine output.  eICU Interventions  Continue current management.       Intervention Category Major Interventions: Acid-Base disturbance - evaluation and management  Larinda ButteryVanessa Mar Walmer 02/11/2018, 9:28 PM   5:15 AM  Repeat ABG continues to improve.   Check CXR for ETT placement.  Recommended to titrate FiO2 down first before going down on the PEEP.

## 2018-02-11 NOTE — Progress Notes (Signed)
Patients head turned to the left side without any complications. Sats 95%. Will continue to monitor.

## 2018-02-11 NOTE — Progress Notes (Signed)
Patient wife states that he does not use a Vaper but that he does smoke marijuana

## 2018-02-11 NOTE — Progress Notes (Signed)
Dr Delton CoombesByrum notified no urine out x3 hours

## 2018-02-11 NOTE — Progress Notes (Signed)
Pharmacy Antibiotic Note  Roberto Knapp is a 47 y.o. male admitted on 02/09/2018 with 10 days of fever, aches, SOB, abdominal pain, nausea and vomiting.  Pharmacy has been consulted for vancomycin dosing for PNA.  Patient is also on Zosyn and azithromycin.   SCr worsened to 1.8, CrCL 52 ml/min, Tmax 103.5, WBC up to 26.6, LA down to 1.5 and PCT up to 49.   Plan: Change vanc to 1519m IV Q24H for AUC 527 using SCr 1.8 Continue Zosyn EID 3.375gm IV Q8H per MD Continue Azith 5057mIV Q24H per MD Monitor renal fxn, clinical progress, vanc level as indicated   Height: _0  (172.7 cm) Weight: 183 lb 3.2 oz (83.1 kg) IBW/kg (Calculated) : 68.4  Temp (24hrs), Avg:102.6 F (39.2 C), Min:100.2 F (37.9 C), Max:103.8 F (39.9 C)  Recent Labs  Lab 02/09/18 1725 02/09/18 1757 02/09/18 1758 02/09/18 2014 02/09/18 2124 02/10/18 0540 02/10/18 0759 02/11/18 0152 02/11/18 0415  WBC 18.8*  --   --   --   --  18.2*  --   --  26.6*  CREATININE 1.20 1.30*  --   --  1.14 1.22  --  1.80*  --   LATICACIDVEN  --   --  1.74 1.33  --  1.8 1.5  --   --     Estimated Creatinine Clearance: 52.2 mL/min (A) (by C-G formula based on SCr of 1.8 mg/dL (H)).    No Known Allergies   Vanc x1 12/8, 12/9 >> Cefepime x1 12/8 Flagyl 12/8 >> 12/9 Zosyn 12/9 >> Azith 12/8 >>  12/8 BCx -  12/8 UCx - negative 12/8 GI panel PCR - negative 12/8 C.diff - negative 12/9 MRSA PCR - negative 12/9 BAL -   Roberto Crisafulli D. DaMina MarblePharmD, BCPS, BCGranada2/12/2017, 2:04 PM

## 2018-02-11 NOTE — Progress Notes (Signed)
Titration of metanephrine with cuff b/p

## 2018-02-12 ENCOUNTER — Inpatient Hospital Stay (HOSPITAL_COMMUNITY): Payer: BLUE CROSS/BLUE SHIELD

## 2018-02-12 DIAGNOSIS — J80 Acute respiratory distress syndrome: Secondary | ICD-10-CM

## 2018-02-12 LAB — BLOOD GAS, ARTERIAL
Acid-base deficit: 8 mmol/L — ABNORMAL HIGH (ref 0.0–2.0)
Acid-base deficit: 7.3 mmol/L — ABNORMAL HIGH (ref 0.0–2.0)
Bicarbonate: 18.5 mmol/L — ABNORMAL LOW (ref 20.0–28.0)
Bicarbonate: 18.7 mmol/L — ABNORMAL LOW (ref 20.0–28.0)
Drawn by: 10006
Drawn by: 10006
FIO2: 100
FIO2: 100
MECHVT: 410 mL
MECHVT: 410 mL
O2 Saturation: 97.6 %
O2 Saturation: 98 %
PEEP: 18 cmH2O
PEEP: 14 cmH2O
Patient temperature: 99.5
Patient temperature: 99.1
RATE: 35 resp/min
RATE: 35 resp/min
pCO2 arterial: 49.3 mmHg — ABNORMAL HIGH (ref 32.0–48.0)
pCO2 arterial: 45.7 mmHg (ref 32.0–48.0)
pH, Arterial: 7.202 — ABNORMAL LOW (ref 7.350–7.450)
pH, Arterial: 7.238 — ABNORMAL LOW (ref 7.350–7.450)
pO2, Arterial: 110 mmHg — ABNORMAL HIGH (ref 83.0–108.0)
pO2, Arterial: 117 mmHg — ABNORMAL HIGH (ref 83.0–108.0)

## 2018-02-12 LAB — BASIC METABOLIC PANEL
Anion gap: 15 (ref 5–15)
Anion gap: 17 — ABNORMAL HIGH (ref 5–15)
BUN: 39 mg/dL — ABNORMAL HIGH (ref 6–20)
BUN: 42 mg/dL — ABNORMAL HIGH (ref 6–20)
CO2: 17 mmol/L — ABNORMAL LOW (ref 22–32)
CO2: 18 mmol/L — ABNORMAL LOW (ref 22–32)
Calcium: 6.7 mg/dL — ABNORMAL LOW (ref 8.9–10.3)
Calcium: 6.5 mg/dL — ABNORMAL LOW (ref 8.9–10.3)
Chloride: 102 mmol/L (ref 98–111)
Chloride: 101 mmol/L (ref 98–111)
Creatinine, Ser: 3.74 mg/dL — ABNORMAL HIGH (ref 0.61–1.24)
Creatinine, Ser: 4 mg/dL — ABNORMAL HIGH (ref 0.61–1.24)
GFR calc Af Amer: 21 mL/min — ABNORMAL LOW (ref >60)
GFR calc Af Amer: 19 mL/min — ABNORMAL LOW (ref >60)
GFR calc non Af Amer: 18 mL/min — ABNORMAL LOW (ref >60)
GFR calc non Af Amer: 16 mL/min — ABNORMAL LOW (ref >60)
Glucose, Bld: 121 mg/dL — ABNORMAL HIGH (ref 70–99)
Glucose, Bld: 119 mg/dL — ABNORMAL HIGH (ref 70–99)
Potassium: 4 mmol/L (ref 3.5–5.1)
Potassium: 3.6 mmol/L (ref 3.5–5.1)
Sodium: 134 mmol/L — ABNORMAL LOW (ref 135–145)
Sodium: 136 mmol/L (ref 135–145)

## 2018-02-12 LAB — RENAL FUNCTION PANEL
Albumin: <1 g/dL — ABNORMAL LOW (ref 3.5–5.0)
Anion gap: 13 (ref 5–15)
BUN: 39 mg/dL — ABNORMAL HIGH (ref 6–20)
CO2: 23 mmol/L (ref 22–32)
Calcium: 6.4 mg/dL — CL (ref 8.9–10.3)
Chloride: 99 mmol/L (ref 98–111)
Creatinine, Ser: 3.69 mg/dL — ABNORMAL HIGH (ref 0.61–1.24)
GFR calc Af Amer: 21 mL/min — ABNORMAL LOW (ref >60)
GFR calc non Af Amer: 18 mL/min — ABNORMAL LOW (ref >60)
Glucose, Bld: 120 mg/dL — ABNORMAL HIGH (ref 70–99)
Phosphorus: 6.1 mg/dL — ABNORMAL HIGH (ref 2.5–4.6)
Potassium: 3.5 mmol/L (ref 3.5–5.1)
Sodium: 135 mmol/L (ref 135–145)

## 2018-02-12 LAB — GLUCOSE, CAPILLARY
Glucose-Capillary: 101 mg/dL — ABNORMAL HIGH (ref 70–99)
Glucose-Capillary: 103 mg/dL — ABNORMAL HIGH (ref 70–99)
Glucose-Capillary: 104 mg/dL — ABNORMAL HIGH (ref 70–99)
Glucose-Capillary: 105 mg/dL — ABNORMAL HIGH (ref 70–99)
Glucose-Capillary: 109 mg/dL — ABNORMAL HIGH (ref 70–99)
Glucose-Capillary: 113 mg/dL — ABNORMAL HIGH (ref 70–99)
Glucose-Capillary: 125 mg/dL — ABNORMAL HIGH (ref 70–99)

## 2018-02-12 LAB — CULTURE, BAL-QUANTITATIVE W GRAM STAIN
Culture: NO GROWTH
Special Requests: NORMAL

## 2018-02-12 LAB — CBC
HCT: 31.9 % — ABNORMAL LOW (ref 39.0–52.0)
Hemoglobin: 10.4 g/dL — ABNORMAL LOW (ref 13.0–17.0)
MCH: 30.2 pg (ref 26.0–34.0)
MCHC: 32.6 g/dL (ref 30.0–36.0)
MCV: 92.7 fL (ref 80.0–100.0)
Platelets: 198 10*3/uL (ref 150–400)
RBC: 3.44 MIL/uL — ABNORMAL LOW (ref 4.22–5.81)
RDW: 16 % — ABNORMAL HIGH (ref 11.5–15.5)
WBC: 31.1 10*3/uL — ABNORMAL HIGH (ref 4.0–10.5)
nRBC: 0 % (ref 0.0–0.2)

## 2018-02-12 LAB — POCT I-STAT 3, ART BLOOD GAS (G3+)
Acid-base deficit: 7 mmol/L — ABNORMAL HIGH (ref 0.0–2.0)
Bicarbonate: 21.3 mmol/L (ref 20.0–28.0)
O2 Saturation: 94 %
Patient temperature: 97.3
TCO2: 23 mmol/L (ref 22–32)
pCO2 arterial: 51.2 mmHg — ABNORMAL HIGH (ref 32.0–48.0)
pH, Arterial: 7.223 — ABNORMAL LOW (ref 7.350–7.450)
pO2, Arterial: 81 mmHg — ABNORMAL LOW (ref 83.0–108.0)

## 2018-02-12 LAB — MAGNESIUM: Magnesium: 2.6 mg/dL — ABNORMAL HIGH (ref 1.7–2.4)

## 2018-02-12 IMAGING — DX DG CHEST 1V PORT
1 series · 1 of 1 positions shown · non-contrast
Comparison: [DATE] at [A6] hours

CLINICAL DATA: Status post central line placement.

EXAM:
PORTABLE CHEST 1 VIEW

[chest]
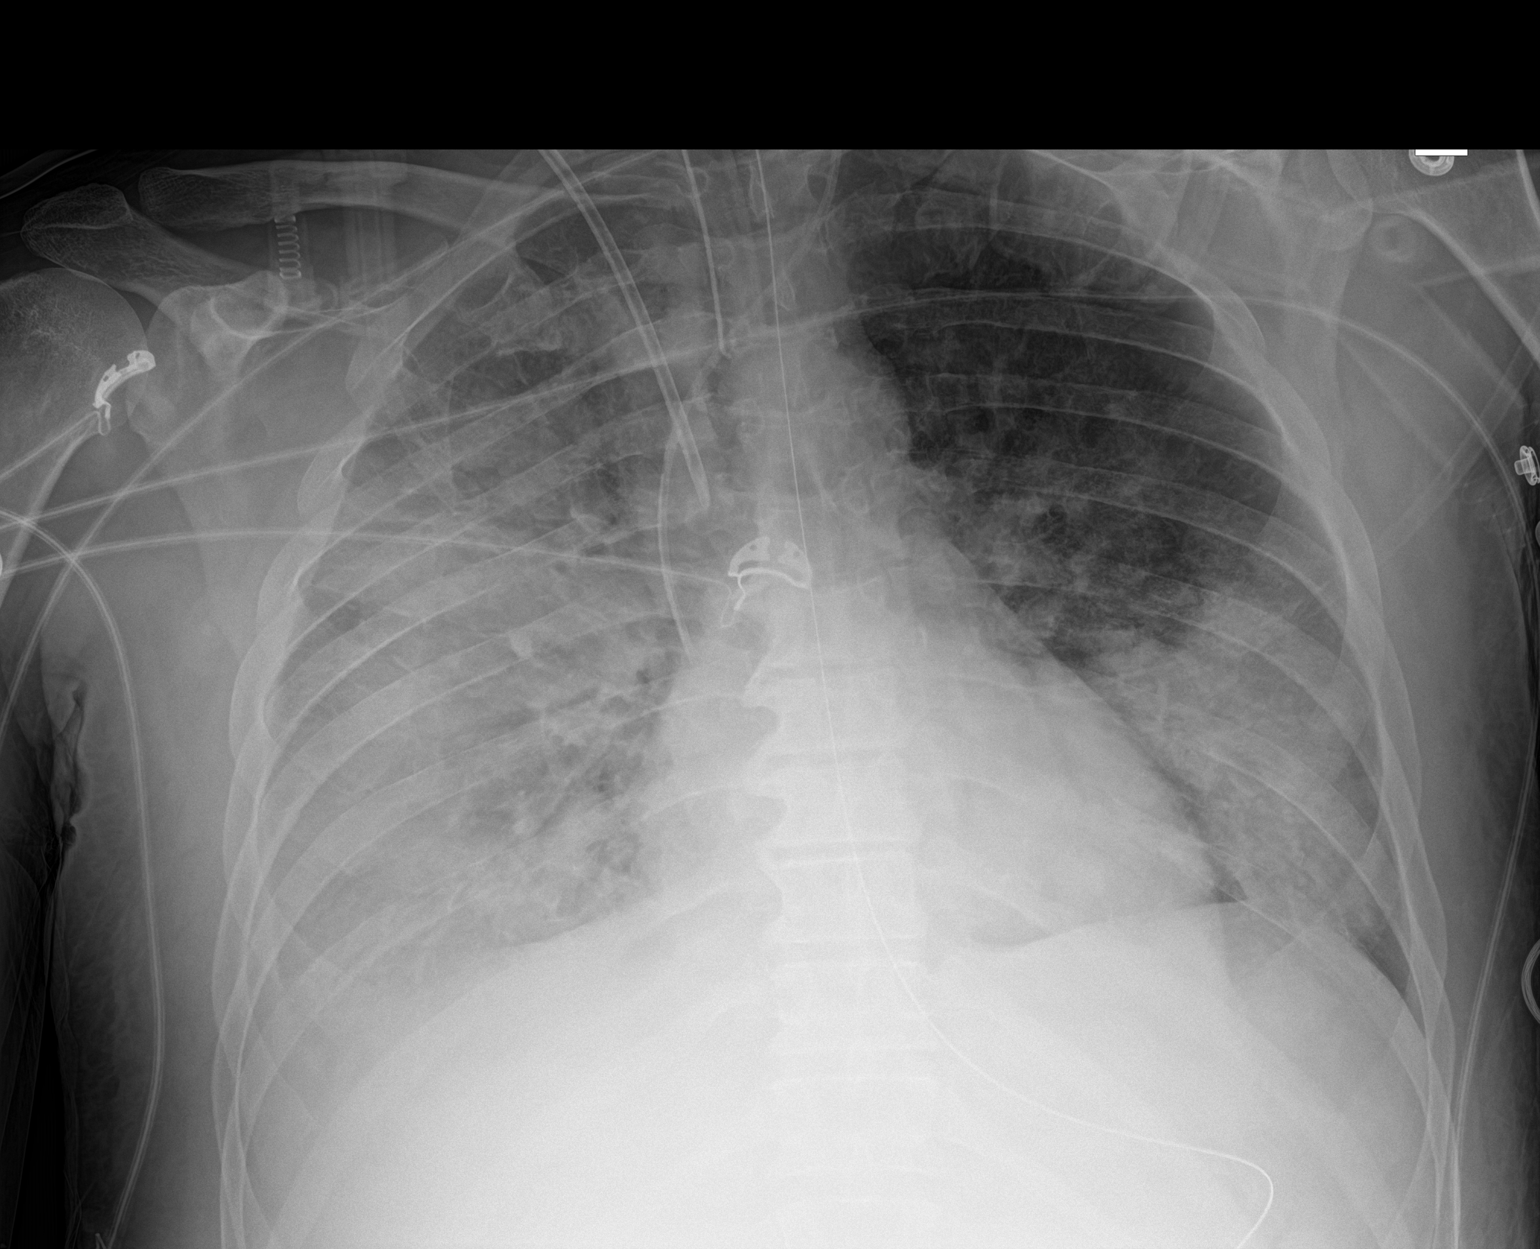

[1 of 1 positions shown; findings below may reference images not displayed]

FINDINGS: Endotracheal tube and left jugular catheter are unchanged. Enteric
tube courses into the left upper abdomen with tip not imaged. A new
right jugular catheter terminates over the mid to upper SVC.
Extensive airspace consolidation throughout the majority of the
right lung and throughout the left lower lobe is unchanged. There
may be a small right pleural effusion. No pneumothorax is
identified.
IMPRESSION: 1. New right jugular catheter terminates over the mid to upper SVC.
No pneumothorax.
2. Unchanged extensive bilateral airspace disease.

## 2018-02-12 IMAGING — DX DG CHEST 1V PORT
1 series · 1 of 1 positions shown · non-contrast
Comparison: [DATE] chest radiograph

CLINICAL DATA: 47 y/o M; respiratory failure and shortness of
breath.

EXAM:
PORTABLE CHEST 1 VIEW

[chest]
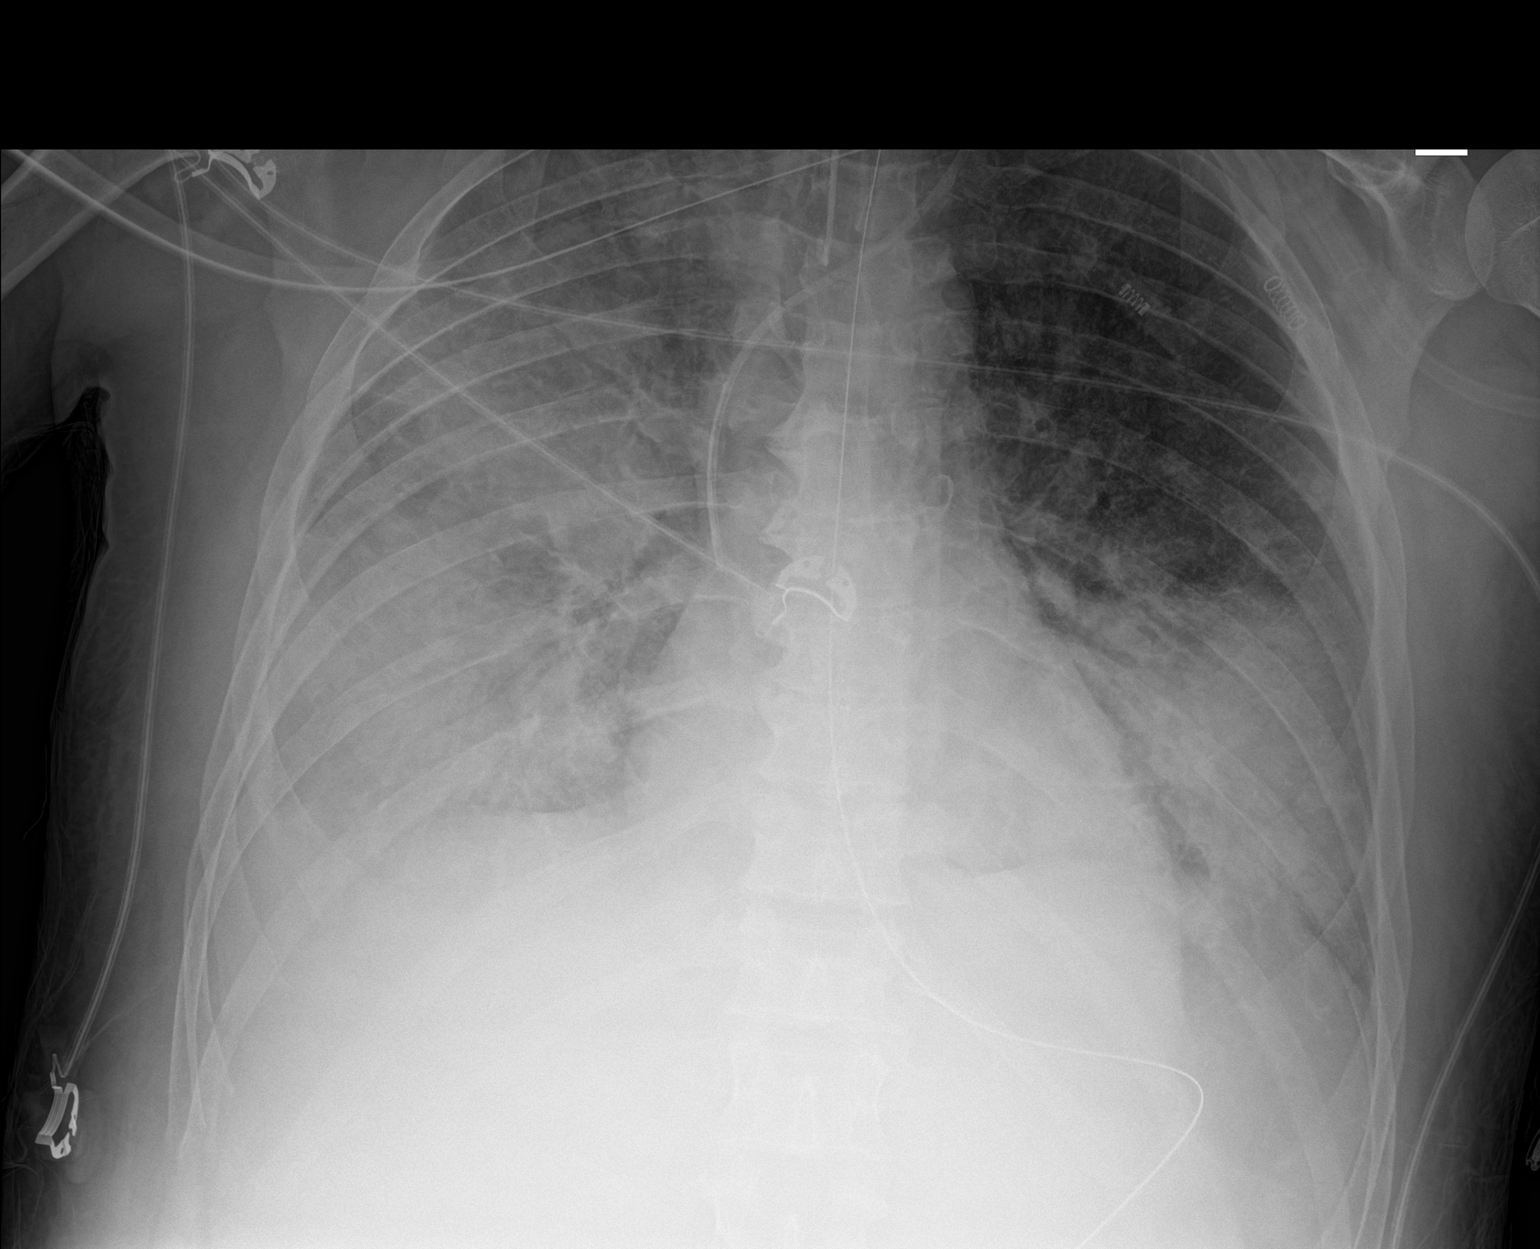

[1 of 1 positions shown; findings below may reference images not displayed]

FINDINGS: Stable endotracheal tube and left central venous catheter. Enteric
tube extends below field of view and abdomen. Stable
right-greater-than-left diffuse pulmonary consolidation. No
pneumothorax. Bones are unremarkable.
IMPRESSION: Stable lines and tubes.  Stable severe airspace disease.

## 2018-02-12 MED ORDER — NOREPINEPHRINE 4 MG/250ML-% IV SOLN: 0.0000 ug/min | INTRAVENOUS | Status: DC

## 2018-02-12 MED ORDER — PRISMASOL BGK 4/2.5 32-4-2.5 MEQ/L REPLACEMENT SOLN
Status: DC
  Administered 2018-02-12 – 2018-02-17 (×11): via INTRAVENOUS_CENTRAL
  Filled 2018-02-12 (×14): qty 5000

## 2018-02-12 MED ORDER — PRISMASOL BGK 4/2.5 32-4-2.5 MEQ/L IV SOLN
INTRAVENOUS | Status: DC
  Administered 2018-02-12 – 2018-02-17 (×38): via INTRAVENOUS_CENTRAL
  Filled 2018-02-12 (×51): qty 5000

## 2018-02-12 MED ORDER — CHLORHEXIDINE GLUCONATE CLOTH 2 % EX PADS
6.0000 | MEDICATED_PAD | Freq: Every day | CUTANEOUS | Status: DC
  Administered 2018-02-12 – 2018-02-18 (×6): 6 via TOPICAL

## 2018-02-12 MED ORDER — SODIUM CHLORIDE 0.9 % FOR CRRT
INTRAVENOUS_CENTRAL | Status: DC | PRN
  Filled 2018-02-12: qty 1000

## 2018-02-12 MED ORDER — HEPARIN SODIUM (PORCINE) 1000 UNIT/ML DIALYSIS
1000.0000 [IU] | INTRAMUSCULAR | Status: DC | PRN
  Administered 2018-02-17: 2400 [IU] via INTRAVENOUS_CENTRAL
  Filled 2018-02-12 (×2): qty 6
  Filled 2018-02-12: qty 3

## 2018-02-12 MED ORDER — PIPERACILLIN-TAZOBACTAM 3.375 G IVPB 30 MIN
3.3750 g | Freq: Four times a day (QID) | INTRAVENOUS | Status: DC
  Administered 2018-02-12 – 2018-02-13 (×3): 3.375 g via INTRAVENOUS
  Filled 2018-02-12 (×4): qty 50

## 2018-02-12 MED ORDER — NOREPINEPHRINE 16 MG/250ML-% IV SOLN
0.0000 ug/min | INTRAVENOUS | Status: DC
  Administered 2018-02-12: 10 ug/min via INTRAVENOUS
  Filled 2018-02-12: qty 250

## 2018-02-12 MED ORDER — PRISMASOL BGK 4/2.5 32-4-2.5 MEQ/L REPLACEMENT SOLN
Status: DC
  Administered 2018-02-12 – 2018-02-17 (×11): via INTRAVENOUS_CENTRAL
  Filled 2018-02-12 (×14): qty 5000

## 2018-02-12 NOTE — Progress Notes (Signed)
eLink Physician-Brief Progress Note Patient Name: Roberto BussingGary W Knapp DOB: [birth year] MRN: [id number]   Date of Service  02/12/2018  HPI/Events of Note  Pt maxed on neosynephrine but unable to remove fluid from CRRT due to hypotension.  eICU Interventions  Add levophed.     Intervention Category Major Interventions: Hypotension - evaluation and management  Larinda ButteryVanessa Cartier Washko 02/12/2018, 8:51 PM

## 2018-02-12 NOTE — Procedures (Signed)
Hemodialysis Insertion Procedure Note Roberto BussingGary W Knapp [id number] 1970/09/23  Procedure: Insertion of Hemodialysis Catheter Type: 3 port  Indications: Hemodialysis   Procedure Details Consent: Risks of procedure as well as the alternatives and risks of each were explained to the (patient/caregiver).  Consent for procedure obtained. Time Out: Verified patient identification, verified procedure, site/side was marked, verified correct patient position, special equipment/implants available, medications/allergies/relevent history reviewed, required imaging and test results available.  Performed  Maximum sterile technique was used including antiseptics, cap, gloves, gown, hand hygiene, mask and sheet. Skin prep: Chlorhexidine; local anesthetic administered A antimicrobial bonded/coated triple lumen catheter was placed in the right internal jugular vein using the Seldinger technique. Ultrasound guidance used.Yes.   Catheter placed to 15 cm. Blood aspirated via all 3 ports and then flushed x 3. Line sutured x 2 and dressing applied.  Evaluation Blood flow good Complications: No apparent complications Patient did tolerate procedure well. Chest X-ray ordered to verify placement.  CXR: pending.  Roberto CanalesSteve Minor ACNP [facility]ackLe [facility] PCCM Pager [phone] till 1 pm If no answer page [phone] 02/12/2018, 11:45 AM

## 2018-02-12 NOTE — Progress Notes (Signed)
NAME:  Roberto Knapp, MRN:  [id number], DOB:  1970-06-08, LOS: 3 ADMISSION DATE:  02/09/2018, CONSULTATION DATE:  12/9 REFERRING MD:  Candiss Norse, CHIEF COMPLAINT:  Acute hypoxic respiratory failure    Brief History   60 yom admitted 12/8 w/ CAP, probable gastritis (from NSAIDS) and progressive hypoxic resp failure   History of present illness   46 year old male presented to ED 12/8 w/ cc: fever, body ache, abd pain, N/V, melena, BRBPR, cough and SOB. Initial onset about 10d prior. Initial symptoms were the fever, N/V/D,  body ache, cough shortness of breath for which he was taking advil about every 4 hours, the melena and BRBPR started a few days later. He reported the abd pain as epigastric in nature.  In ER: he was febrile, tachycardic,his RR was in 30s, wbc 18.9, CXR showed marked R>L airspace disease. CT abd/pelvis was neg. FOB pos, influenza PCR neg, UDS was positive for THC. He was admitted to IM service. Started on supplemental oxygen and abx. His respiratory status continued to worsen and by am 12/9 he was titrated up to 100 % on BIPAP w/ marked accessory use and RR in excess of 35 BPM. His HR on tele was in 160s in atrial fib. PCCM asked to see.   Past Medical History  smoker  Significant Hospital Events   12/8 admitted w/ working dx PNA, GIB (prb 2/2 NSAIDs) and acute resp failure 12/9 PCCM called. Resp status progressed. 100% BIPAP. R?L infiltrate on CXR 5/9 paralytics and prone positioning  Consults:  PCCM   Procedures:  OETT 12/9 >>  Art line 12/9 >>  L IJ HD catheter 12/9 >>   Significant Diagnostic Tests:  CT abdomen/pelvis 12/8 >> extensive bilateral basilar airspace disease, no acute findings in the abdomen or pelvis  Micro Data:  Influenza PCR 12/8: negative Strep antigen 12/8: negative Legionella antigen 12/8>>> positive Respiratory culture 12/9>>> BCX2 12/9>>> Urine 12/8 >> negative BAL 12/9 >> negative Pneumocystis DFA 12/9 >> negative cdiff 12/9: negative GI  Panel 12/9>>> negative  Antimicrobials:  Zosyn 12/9>>> Vanc 12/8>>> 12/10 azith 12/9>>>  Interim history/subjective:  Patient an uric over the last 24 hours, evolving renal failure FiO2 has been decreased to 80, PEEP decreased to 14 Phenylephrine down to 190   Objective   Blood pressure 94/65, pulse (!) 101, temperature 98.6 F (37 C), resp. rate (!) 35, height _0  (1.727 m), weight 86.4 kg, SpO2 97 %. CVP:  [19 mmHg] 19 mmHg  Vent Mode: PRVC FiO2 (%):  [80 %-100 %] (P) 80 % Set Rate:  [33 bmp-35 bmp] 35 bmp Vt Set:  [410 mL] 410 mL PEEP:  [14 cmH20-18 cmH20] 14 cmH20 Plateau Pressure:  [26 cmH20-35 cmH20] 31 cmH20   Intake/Output Summary (Last 24 hours) at 02/12/2018 0805 Last data filed at 02/12/2018 0600 Gross per 24 hour  Intake 5784.19 ml  Output 33 ml  Net 5751.19 ml   Filed Weights   02/10/18 0147 02/11/18 0600 02/12/18 0500  Weight: 71.4 kg 83.1 kg 86.4 kg    Examination: General: Critically ill, ventilated, now in the supine position HENT: Endotracheal tube in good position, oropharynx clear, periorbital edema is improving Lungs: Bilateral diffuse rhonchi, inspiratory left-sided squeak Cardiovascular: Irregularly irregular, tachycardic, no murmur Abdomen: Very scant bowel sounds, no distention Extremities: Trace pretibial edema Neuro: Paralyzed and sedated GU: Foley catheter in place  Resolved Hospital Problem list     Assessment & Plan:  Acute Hypoxic respiratory failure in setting of  CAP vs aspiration (favor CAP, Legionella antigen positive) w/  ARDS.  No history of vaping in the last year per his wife Plan Continue ventilation per ARDS protocol.  FiO2 now 80%.  PEEP at 14.  I will consider increasing the PEEP if necessary to achieve an FiO2 goal of 60% today. Continue paralytics.  Based on course I would leave him in the supine position, avoid repeat proning for now. Antibiotics: Azithromycin, Zosyn.  Vancomycin discontinued Depending on course we  will still consider whether he will need transfer for ECMO.  Shock, presumed septic shock Plan Wean phenylephrine as able Antibiotics as above Check random cortisol with next blood draw, consider hydrocortisone  afib w/ RVR; currently has perfusing BP Plan Continue amiodarone infusion as ordered.  Some concern regarding the potential for pulmonary toxicity given his high FiO2 needs. Anticoagulation deferred at this time due to recent GI blood loss  Acute renal failure, oliguric Mixed anion gap metabolic and respiratory acidosis Plan He will need renal replacement therapy today.  Will discuss with nephrology and place hemodialysis catheter. Continue bicarb infusion at same rate for now, hopefully will be able to decrease once hemodialysis initiated  GIB: Suspect this 2/2 NSAIDS Plan Continue pantoprazole as ordered Consider initiation nutrition next 24 to 48 hours.  Avoiding currently given volume status and paralytics  Fluid and electrolyte imbalance: hyponatremia, hypokalemia  Plan Continue current IV fluids Follow BMP, replace electrolytes as indicated  Mild LFT elevation Plan  Follow LFT next blood draw and intermittently    Best practice:  Diet: NPO Pain/Anxiety/Delirium protocol (if indicated): 12/9 VAP protocol (if indicated): 12/9 DVT prophylaxis: [place] heparin  GI prophylaxis: PPI BID Glucose control: na Mobility: BR Code Status: full code  Family Communication: Updated the patient's wife and brother at bedside 12/11 Disposition: ICU  Labs   CBC: Recent Labs  Lab 02/09/18 1725  02/10/18 0540 02/10/18 1144 02/10/18 2118 02/11/18 0415 02/12/18 0358  WBC 18.8*  --  18.2*  --   --  26.6* 31.1*  NEUTROABS 17.7*  --   --   --   --   --   --   HGB 11.6*   < > 10.6* 11.1* 10.5* 11.0* 10.4*  HCT 33.7*   < > 31.2* 33.0* 33.3* 34.6* 31.9*  MCV 88.5  --  89.9  --   --  96.1 92.7  PLT 217  --  149*  --   --  272 198   < > = values in this interval not displayed.     Basic Metabolic Panel: Recent Labs  Lab 02/09/18 1725  02/09/18 2124 02/10/18 0540 02/11/18 0152 02/11/18 1828 02/12/18 0358  NA 121*   < > 129* 131* 135 133* 134*  K 3.0*   < > 3.5 3.4* 4.4 4.2 4.0  CL 92*   < > 99 101 106 102 102  CO2 19*  --  17* 20* 19* 16* 17*  GLUCOSE 105*   < > 84 122* 86 98 121*  BUN 23*   < > 23* 17 23* 35* 39*  CREATININE 1.20   < > 1.14 1.22 1.80* 3.30* 3.74*  CALCIUM 7.4*  --  6.8* 7.2* 7.0* 6.8* 6.7*  MG 1.9  --   --   --  2.5*  --  2.6*   < > = values in this interval not displayed.   GFR: Estimated Creatinine Clearance: 25.5 mL/min (A) (by C-G formula based on SCr of 3.74 mg/dL (H)). Recent Labs  Lab 02/09/18 1725 02/09/18 1758 02/09/18 2014 02/09/18 2124 02/10/18 0540 02/10/18 0759 02/11/18 0152 02/11/18 0415 02/12/18 0358  PROCALCITON  --   --   --  29.93 31.41  --  49.28  --   --   WBC 18.8*  --   --   --  18.2*  --   --  26.6* 31.1*  LATICACIDVEN  --  1.74 1.33  --  1.8 1.5  --   --   --     Liver Function Tests: Recent Labs  Lab 02/09/18 1725 02/10/18 0540 02/11/18 0152  AST 136* 115* 110*  ALT 57* 46* 35  ALKPHOS 90 79 76  BILITOT 1.2 1.0 1.0  PROT 5.6* 5.1* 4.7*  ALBUMIN 1.7* 1.5* 1.2*   Recent Labs  Lab 02/09/18 1725  LIPASE 21   No results for input(s): AMMONIA in the last 168 hours.  ABG    Component Value Date/Time   PHART 7.238 (L) 02/12/2018 0510   PCO2ART 45.7 02/12/2018 0510   PO2ART 117 (H) 02/12/2018 0510   HCO3 18.7 (L) 02/12/2018 0510   TCO2 19 (L) 02/11/2018 1441   ACIDBASEDEF 7.3 (H) 02/12/2018 0510   O2SAT 98.0 02/12/2018 0510     Coagulation Profile: Recent Labs  Lab 02/09/18 1725  INR 1.14    Cardiac Enzymes: Recent Labs  Lab 02/09/18 2124  TROPONINI 0.05*    HbA1C: No results found for: HGBA1C  CBG: Recent Labs  Lab 02/11/18 1537 02/11/18 2004 02/12/18 0016 02/12/18 0402 02/12/18 0752  GLUCAP 88 100* 103* 104* 109*    Review of Systems:   Not able   Past  Medical History  He,  has a past medical history of Drug overdose, intentional ([place]).   Surgical History    Past Surgical History:  Procedure Laterality Date  . FASCIOTOMY  09/08/2011   Procedure: FASCIOTOMY;  Surgeon: Elam Dutch, MD;  Location: [place];  Service: Vascular;  Laterality: Left;  . FEMORAL-POPLITEAL BYPASS GRAFT  09/08/2011   Procedure: BYPASS GRAFT FEMORAL-POPLITEAL ARTERY;  Surgeon: Elam Dutch, MD;  Location[facility];  Service: Vascular;  Laterality: Left;  . NO PAST SURGERIES       Social History   reports that he has been smoking cigarettes. He has a 12.50 pack-year smoking history. He has never used smokeless tobacco. He reports that he drinks alcohol. He reports that he does not use drugs.   Family History   His family history includes Hypertension in his father.   Allergies No Known Allergies   Home Medications  Prior to Admission medications   Medication Sig Start Date End Date Taking? Authorizing Provider  Acetaminophen (TYLENOL PO) Take 2 tablets by mouth every 6 (six) hours as needed (pain/fever/headache).   Yes [provider]  ibuprofen (ADVIL,MOTRIN) 200 MG tablet Take 400 mg by mouth every 6 (six) hours as needed for fever, headache, mild pain, moderate pain or cramping.   Yes [provider]  lisdexamfetamine (VYVANSE) 50 MG capsule Take 50 mg by mouth daily.   Yes [provider]     Critical care time: 46 min    Baltazar Apo, MD, PhD 02/12/2018, 8:05 AM [facility] [phone] or if no answer [phone]

## 2018-02-12 NOTE — Progress Notes (Signed)
Pharmacy Antibiotic Note  Roberto Knapp is a 47 y.o. male admitted on 02/09/2018 with 10 days of fever, aches, SOB, abdominal pain, nausea and vomiting.  Currently on Zosyn and azithromycin for sepsis 2/2 PNA.  Positive for Legionella.   Patient to start CRRT.  Now afebrile, WBC up to 31.1.   Plan: Adjust Zosyn to 3.375gm IV Q6H, infuse over 30 min for CRRT Continue azith 563m IV Q24H Monitor CRRT tolerance/interruption, clinical progress   Height: _0  (172.7 cm) Weight: 190 lb 7.6 oz (86.4 kg) IBW/kg (Calculated) : 68.4  Temp (24hrs), Avg:99.5 F (37.5 C), Min:97.2 F (36.2 C), Max:100.4 F (38 C)  Recent Labs  Lab 02/09/18 1725  02/09/18 1758 02/09/18 2014  02/10/18 0540 02/10/18 0759 02/11/18 0152 02/11/18 0415 02/11/18 1828 02/12/18 0358 02/12/18 0922  WBC 18.8*  --   --   --   --  18.2*  --   --  26.6*  --  31.1*  --   CREATININE 1.20   < >  --   --    < > 1.22  --  1.80*  --  3.30* 3.74* 4.00*  LATICACIDVEN  --   --  1.74 1.33  --  1.8 1.5  --   --   --   --   --    < > = values in this interval not displayed.    Estimated Creatinine Clearance: 23.9 mL/min (A) (by C-G formula based on SCr of 4 mg/dL (H)).    No Known Allergies  Vanc x1 12/8, 12/9 >> 12/10 Cefepime x1 12/8 Flagyl 12/8 >> 12/9 Zosyn 12/9 >> Azith 12/8 >>  12/8 BCx - NGTD 12/8 UCx - negative 12/8 GI panel PCR - negative 12/8 C.diff - negative 12/9 MRSA PCR - negative 12/9 BAL - negative 12/9 Strep pneumo Ur Ag - negative 12/9 Legionella - positive  Tiauna Whisnant D. DMina Marble PharmD, BCPS, BMcEwen12/01/2018, 2:06 PM

## 2018-02-12 NOTE — Consult Note (Signed)
Referring Provider: No ref. provider found Primary Care Physician:  Donald Prose, MD Primary Nephrologist:    Reason for Consultation: Acute kidney injury, hypoxic respiratory failure, hypotension and septic shock, metabolic acidosis,  HPI: This is a very pleasant 47 year old gentleman who was brought to the emergency department 02/09/2018 with a febrile illness.  He was thought to have pneumonia with right greater than left airspace disease.  He continued to have worsening hypoxic respiratory failure and was transitioned from oxygen through nasal cannula to BiPAP to intubation over the ensuing 24 hours.  His flu serology was negative, strep antigen was negative Legionella antigen and respiratory cultures are all pending.  BAL was performed on 02/10/2018 pneumocystis is also pending at this time.  He was placed on broad-spectrum antibiotic Zosyn, vancomycin and azithromycin  On admission it appears that patient has been taking Advil and a developed melena stools.  These were reported to his admitting physician he also reports drinking 2-3 beers per day denies illicit drugs but was positive for THC.  He has a history of a fasciotomy performed in 2013 and femoropopliteal bypass performed by Dr. Oneida Alar in 2013.  In reviewing flowsheet it appears that there is been some systolic hypertension developed on 02/10/2018 with blood pressures dipped into the 90s millimeters of mercury.    Creatinine since admission 02/09/2018   1.2  02/10/2018   1.2  02/11/2018  1.8  02/11/2018   3.3  02/12/2018   3.74  02/12/2018   4.0  Urine output is minimal 1.4 L out 02/10/2018 and 308 cc out 02/11/2018  Blood pressure 94/69 pulse 96 temperature 97.3 O2 sats 98% FiO2 80%  IV pressors   phenylephrine for 37.1. IV sodium bicarbonate  Azithromycin IV 500 mg daily Zosyn 3.375 g every 8 hours  Amiodarone drip Lasix 80 mg IV twice daily  pH 7.22 PCO2 51.2 PO2 81 sodium 136 potassium 3.6 chloride 101 CO2 18 glucose 119  BUN 42 creatinine 4.0 calcium 6.5 magnesium 2.6 WBC 31.1 hemoglobin 10.4 platelets 198       Past Medical History:  Diagnosis Date  . Drug overdose, intentional [facility])     Past Surgical History:  Procedure Laterality Date  . FASCIOTOMY  09/08/2011   Procedure: FASCIOTOMY;  Surgeon: Elam Dutch, MD;  Location: [facility];  Service: Vascular;  Laterality: Left;  . FEMORAL-POPLITEAL BYPASS GRAFT  09/08/2011   Procedure: BYPASS GRAFT FEMORAL-POPLITEAL ARTERY;  Surgeon: Elam Dutch, MD;  Location: [facility] OR;  Service: Vascular;  Laterality: Left;  . NO PAST SURGERIES      Prior to Admission medications   Medication Sig Start Date End Date Taking? Authorizing Provider  Acetaminophen (TYLENOL PO) Take 2 tablets by mouth every 6 (six) hours as needed (pain/fever/headache).   Yes [provider]  ibuprofen (ADVIL,MOTRIN) 200 MG tablet Take 400 mg by mouth every 6 (six) hours as needed for fever, headache, mild pain, moderate pain or cramping.   Yes [provider]  lisdexamfetamine (VYVANSE) 50 MG capsule Take 50 mg by mouth daily.   Yes [provider]    Current Facility-Administered Medications  Medication Dose Route Frequency Provider Last Rate Last Dose  . 0.9 %  sodium chloride infusion   Intravenous PRN Erick Colace, NP      . acetaminophen (TYLENOL) suppository 650 mg  650 mg Rectal Q6H PRN Rush Farmer, MD   650 mg at 02/10/18 2040  . acetaminophen (TYLENOL) tablet 650 mg  650 mg Oral Q6H  PRN Vianne Bulls, MD   650 mg at 02/10/18 0517  . amiodarone (NEXTERONE PREMIX) 360-4.14 MG/200ML-% (1.8 mg/mL) IV infusion  30 mg/hr Intravenous Continuous Erick Colace, NP 16.67 mL/hr at 02/12/18 1200 30 mg/hr at 02/12/18 1200  . artificial tears (LACRILUBE) ophthalmic ointment 1 application  1 application Both Eyes W7P Erick Colace, NP   1 application at 71/06/26 0548  . azithromycin (ZITHROMAX) 500 mg in sodium chloride 0.9 % 250 mL IVPB  500 mg  Intravenous Q24H Opyd, Ilene Qua, MD   Stopped at 02/11/18 2047  . chlorhexidine gluconate (MEDLINE KIT) (PERIDEX) 0.12 % solution 15 mL  15 mL Mouth Rinse BID Rush Farmer, MD   15 mL at 02/12/18 0811  . Chlorhexidine Gluconate Cloth 2 % PADS 6 each  6 each Topical Q0600 Collene Gobble, MD      . cisatracurium (NIMBEX) 200 mg in sodium chloride 0.9 % 200 mL (1 mg/mL) infusion  0.5-10 mcg/kg/min (Order-Specific) Intravenous Titrated Collene Gobble, MD 5.49 mL/hr at 02/12/18 0232 1.2 mcg/kg/min at 02/12/18 0232  . fentaNYL (SUBLIMAZE) bolus via infusion 50 mcg  50 mcg Intravenous Q1H PRN Erick Colace, NP   50 mcg at 02/10/18 1450  . fentaNYL (SUBLIMAZE) bolus via infusion 50 mcg  50 mcg Intravenous Q30 min PRN Erick Colace, NP      . fentaNYL (SUBLIMAZE) injection 100 mcg  100 mcg Intravenous Once PRN Erick Colace, NP      . fentaNYL 2525mg in NS 2538m(10107mml) infusion-PREMIX  25-400 mcg/hr Intravenous Continuous ByrCollene GobbleD 30 mL/hr at 02/12/18 1200 300 mcg/hr at 02/12/18 1200  . Influenza vac split quadrivalent PF (FLUARIX) injection 0.5 mL  0.5 mL Intramuscular Tomorrow-1000 YacRush FarmerD   Stopped at 02/12/18 [id number] levalbuterol (XOPENEX) nebulizer solution 0.63 mg  0.63 mg Nebulization Q6H PRN Blount, Xenia T, NP      . MEDLINE mouth rinse  15 mL Mouth Rinse 10 times per day YacRush FarmerD   15 mL at 02/12/18 1150  . ondansetron (ZOFRAN) injection 4 mg  4 mg Intravenous Q6H PRN Opyd, TimIlene QuaD   4 mg at 02/10/18 0550  . pantoprazole (PROTONIX) injection 40 mg  40 mg Intravenous Q12H Opyd, TimIlene QuaD   40 mg at 02/12/18 0923  . phenylephrine (NEOSYNEPHRINE) 40-0.9 MG/250ML-% infusion  0-400 mcg/min Intravenous Titrated YacRush FarmerD 82.5 mL/hr at 02/12/18 1200 220 mcg/min at 02/12/18 1200  . piperacillin-tazobactam (ZOSYN) IVPB 3.375 g  3.375 g Intravenous Q8H SinThurnell LoseD   Stopped at 02/12/18 1005  . propofol (DIPRIVAN) 1000 MG/100ML  infusion  5-80 mcg/kg/min Intravenous Continuous YacRush FarmerD 21.4 mL/hr at 02/12/18 1216 50 mcg/kg/min at 02/12/18 1216  . sodium bicarbonate 150 mEq in dextrose 5 % 1,000 mL infusion   Intravenous Continuous ByrCollene GobbleD 125 mL/hr at 02/12/18 1200    . sodium chloride flush (NS) 0.9 % injection 3 mL  3 mL Intravenous Q12H Opyd, TimIlene QuaD   3 mL at 02/12/18 0924    Allergies as of 02/09/2018  . (No Known Allergies)    Family History  Problem Relation Age of Onset  . Hypertension Father     Social History   Socioeconomic History  . Marital status: Married    Spouse name: Not on file  . Number of children: Not on file  . Years of education: Not on  file  . Highest education level: Not on file  Occupational History  . Not on file  Social Needs  . Financial resource strain: Not on file  . Food insecurity:    Worry: Not on file    Inability: Not on file  . Transportation needs:    Medical: Not on file    Non-medical: Not on file  Tobacco Use  . Smoking status: Current Every Day Smoker    Packs/day: 0.50    Years: 25.00    Pack years: 12.50    Types: Cigarettes  . Smokeless tobacco: Never Used  . Tobacco comment: pt states that he is trying to quit and has cut back  Substance and Sexual Activity  . Alcohol use: Yes  . Drug use: No  . Sexual activity: Not on file  Lifestyle  . Physical activity:    Days per week: Not on file    Minutes per session: Not on file  . Stress: Not on file  Relationships  . Social connections:    Talks on phone: Not on file    Gets together: Not on file    Attends religious service: Not on file    Active member of club or organization: Not on file    Attends meetings of clubs or organizations: Not on file    Relationship status: Not on file  . Intimate partner violence:    Fear of current or ex partner: Not on file    Emotionally abused: Not on file    Physically abused: Not on file    Forced sexual activity: Not on  file  Other Topics Concern  . Not on file  Social History Narrative   ** Merged History Encounter **        Review of Systems: Able to obtain  Sedated and intubated FiO2 80%  Physical Exam: Vital signs in last 24 hours: Temp:  [97.3 F (36.3 C)-100.4 F (38 C)] 97.3 F (36.3 C) (12/11 1200) Pulse Rate:  [34-117] 99 (12/11 1200) Resp:  [25-36] 35 (12/11 1200) BP: (69-107)/(48-75) 94/69 (12/11 1200) SpO2:  [85 %-99 %] 98 % (12/11 1207) Arterial Line BP: (59-103)/(38-61) 94/58 (12/11 1200) FiO2 (%):  [80 %-100 %] 80 % (12/11 1207) Weight:  [86.4 kg] 86.4 kg (12/11 0500) Last BM Date: 02/09/18 General:   Ill-appearing gentleman  Head:  Normocephalic and atraumatic. Eyes:  Sclera clear, no icterus.   Conjunctiva pink. Ears:  Normal auditory acuity. Nose:  No deformity, discharge,  or lesions. Mouth:  No deformity or lesions, dentition normal.  ET tube Neck:  Supple; no masses or thyromegaly. JVP not elevated Lungs: Coarse breath sounds heard through ventilator Heart: Irregular rate and rhythm Abdomen: Hypoactive bowel sounds Msk:  Symmetrical without gross deformities. Normal posture. Pulses:  No carotid, renal, femoral bruits. DP and PT symmetrical and equal Extremities:  Without clubbing or edema. Neurologic:  Alert and  oriented x4;  grossly normal neurologically. Skin:  Intact without significant lesions or rashes. Cervical Nodes:  No significant cervical adenopathy.   Intake/Output from previous day: 12/10 0701 - 12/11 0700 In: 6285.2 [I.V.:5883.9; IV Piggyback:401.4] Out: 43 [Urine:43] Intake/Output this shift: Total I/O In: 1847.1 [I.V.:1797.2; IV Piggyback:49.9] Out: 325 [Emesis/NG output:325]  Lab Results: Recent Labs    02/10/18 0540  02/10/18 2118 02/11/18 0415 02/12/18 0358  WBC 18.2*  --   --  26.6* 31.1*  HGB 10.6*   < > 10.5* 11.0* 10.4*  HCT 31.2*   < > 33.3* 34.6*  31.9*  PLT 149*  --   --  272 198   < > = values in this interval not  displayed.   BMET Recent Labs    02/11/18 1828 02/12/18 0358 02/12/18 0922  NA 133* 134* 136  K 4.2 4.0 3.6  CL 102 102 101  CO2 16* 17* 18*  GLUCOSE 98 121* 119*  BUN 35* 39* 42*  CREATININE 3.30* 3.74* 4.00*  CALCIUM 6.8* 6.7* 6.5*   LFT Recent Labs    02/10/18 0540 02/11/18 0152  PROT 5.1* 4.7*  ALBUMIN 1.5* 1.2*  AST 115* 110*  ALT 46* 35  ALKPHOS 79 76  BILITOT 1.0 1.0  BILIDIR 0.5*  --   IBILI 0.5  --    PT/INR Recent Labs    02/09/18 1725  LABPROT 14.5  INR 1.14   Hepatitis Panel No results for input(s): HEPBSAG, HCVAB, HEPAIGM, HEPBIGM in the last 72 hours.  Studies/Results: Dg Chest Port 1 View  Result Date: 02/12/2018 CLINICAL DATA:  Status post central line placement. EXAM: PORTABLE CHEST 1 VIEW COMPARISON:  02/12/2018 at 0513 hours FINDINGS: Endotracheal tube and left jugular catheter are unchanged. Enteric tube courses into the left upper abdomen with tip not imaged. A new right jugular catheter terminates over the mid to upper SVC. Extensive airspace consolidation throughout the majority of the right lung and throughout the left lower lobe is unchanged. There may be a small right pleural effusion. No pneumothorax is identified. IMPRESSION: 1. New right jugular catheter terminates over the mid to upper SVC. No pneumothorax. 2. Unchanged extensive bilateral airspace disease. Electronically Signed   By: Logan Bores M.D.   On: 02/12/2018 12:26   Dg Chest Port 1 View  Result Date: 02/12/2018 CLINICAL DATA:  47 y/o M; respiratory failure and shortness of breath. EXAM: PORTABLE CHEST 1 VIEW COMPARISON:  02/11/2018 chest radiograph FINDINGS: Stable endotracheal tube and left central venous catheter. Enteric tube extends below field of view and abdomen. Stable right-greater-than-left diffuse pulmonary consolidation. No pneumothorax. Bones are unremarkable. IMPRESSION: Stable lines and tubes.  Stable severe airspace disease. Electronically Signed   By: Kristine Garbe M.D.   On: 02/12/2018 05:36   Dg Chest Port 1 View  Result Date: 02/11/2018 CLINICAL DATA:  Acute respiratory failure with hypoxia EXAM: PORTABLE CHEST 1 VIEW COMPARISON:  Yesterday FINDINGS: Extensive airspace disease asymmetric to the right. There is reported history of pneumonia and sepsis. Normal heart size. No effusion or pneumothorax. Endotracheal tube with tip between the clavicular heads and carina. Left IJ line with tip at the upper cavoatrial junction. An orogastric tube at least reaches the stomach. IMPRESSION: Stable hardware positioning and severe bilateral airspace disease. Electronically Signed   By: Monte Fantasia M.D.   On: 02/11/2018 12:55   Dg Chest Port 1 View  Result Date: 02/10/2018 CLINICAL DATA:  Intubated patient, respiratory failure, drug overdose, septic shock, current smoker. EXAM: PORTABLE CHEST 1 VIEW COMPARISON:  Portable chest x-ray of February 10, 2018 at 6:06 a.m. FINDINGS: Confluent airspace opacities are present throughout the right lung and are little changed. On the left increasing airspace opacity is developing in the upper lobe. Persistent increased density in the lower lobe is present. The heart is normal in size. The central pulmonary vascularity is prominent. External pacemaker defibrillator pads are present. The endotracheal tube tip projects 4.1 cm above the carina. The esophagogastric tube tip in proximal port project below the GE junction. The left internal jugular venous catheter tip projects over the  midportion of the SVC. IMPRESSION: Interval intubation of the trachea and esophagus and placement of a left internal jugular venous catheter. The support structures are in reasonable position. Worsening airspace opacity in the left upper lung. Fairly stable airspace opacities elsewhere in both lungs. Electronically Signed   By: David  Martinique M.D.   On: 02/10/2018 15:22    Assessment/Plan:  1. Acute kidney injury in the setting of  hypotension sepsis use of nonsteroidal anti-inflammatory drugs.  CT scan of abdomen and pelvis performed with IV contrast 02/07/2018.  No evidence of ACE inhibitors or ARB's.  No evidence of hydronephrosis on CT scan.  Will send urine for urinalysis and urine indices.  Will initiate CRRT no heparin.  4/2.5 PrismaSol replacement fluids and dialysate fluids 2.  Hypotension requiring phenylephrine support.  2D echo revealed ejection fraction 40 to 45%.  Most likely appears to be secondary to septic shock.  Will discontinue Lasix as patient will be receiving dialysis with 50-100 cc removal 3.  Sepsis.  Continues on Zosyn 3.375 g every 8 hours.  Pressors. 4.  Metabolic acidosis continues on IV bicarbonate.  We will continue to follow may need to discontinue IV bicarbonate once dialysis initiated 5.  Atrial fibrillation secondary to sepsis patient on IV amiodarone 6.  GI prophylaxis with Protonix 40 mg every 12 hours          LOS: [place] _0 _1 :56 PM

## 2018-02-12 NOTE — Progress Notes (Signed)
Initial Nutrition Assessment  DOCUMENTATION CODES:   Not applicable  INTERVENTION:   If unable to extubate patient within the next 24 hours, recommend start TF via OGT:  Vital High Protein at 65 ml/h (1560 ml per day)  Provides 1560 kcal (2125 kcal total with propofol), 137 gm protein, 1304 ml free water daily  NUTRITION DIAGNOSIS:   Inadequate oral intake related to inability to eat as evidenced by NPO status.  GOAL:   Patient will meet greater than or equal to 90% of their needs  MONITOR:   Vent status, Labs, Skin, I & O's  REASON FOR ASSESSMENT:   Ventilator    ASSESSMENT:   47 yo male with PMH of drug overdose, DM, and renal insufficiency who was admitted on 12/8 with CAD, gastritis (from NSAIDS) and progressive respiratory failure.    Spoke with patient's wife at bedside. She reports that his weight has been stable. She thinks he doesn't eat enough; he eats small amounts because he takes adderral. He works in [employer], so he usually stays lean in the summer and gains ~5 lbs in the winter. Patient has had abdominal pain, N/V, fever for ~10 days PTA.   Patient is currently intubated on ventilator support MV: 13.8 L/min Temp (24hrs), Avg:99.6 F (37.6 C), Min:97.3 F (36.3 C), Max:100.4 F (38 C)  Propofol: 21.4 ml/hr providing 565 kcal from lipid.  Labs reviewed. Medications reviewed and include nimbex, neosynephrine, propofol.  Weight 71.4 kg on admission, trending up with positive I/O.  NUTRITION - FOCUSED PHYSICAL EXAM:    Most Recent Value  Orbital Region  No depletion  Upper Arm Region  No depletion  Thoracic and Lumbar Region  No depletion  Buccal Region  No depletion  Temple Region  No depletion  Clavicle Bone Region  No depletion  Clavicle and Acromion Bone Region  No depletion  Scapular Bone Region  Unable to assess  Dorsal Hand  No depletion  Patellar Region  No depletion  Anterior Thigh Region  No depletion  Posterior Calf Region  No  depletion  Edema (RD Assessment)  Mild  Hair  Reviewed  Eyes  Unable to assess  Mouth  Unable to assess  Skin  Reviewed  Nails  Reviewed       Diet Order:   Diet Order    None      EDUCATION NEEDS:   No education needs have been identified at this time  Skin:  Skin Assessment: Reviewed RN Assessment  Last BM:  12/8  Height:   Ht Readings from Last 1 Encounters:  02/10/18 5\' 8"  (1.727 m)    Weight:   Wt Readings from Last 1 Encounters:  02/12/18 86.4 kg   71.4 kg on admission  Ideal Body Weight:  70 kg  BMI:  Body mass index is 28.96 kg/m.  Estimated Nutritional Needs:   Kcal:  2050  Protein:  110-130 gm  Fluid:  >/= 2 L    Joaquin CourtsKimberly Jaishon Krisher, RD, LDN, CNSC Pager [phone] After Hours Pager [phone]

## 2018-02-13 ENCOUNTER — Inpatient Hospital Stay (HOSPITAL_COMMUNITY): Payer: BLUE CROSS/BLUE SHIELD

## 2018-02-13 LAB — BLOOD GAS, ARTERIAL
Acid-base deficit: 1.6 mmol/L (ref 0.0–2.0)
Bicarbonate: 24.7 mmol/L (ref 20.0–28.0)
Drawn by: 546051
FIO2: 70
MECHVT: 410 mL
O2 Saturation: 95.7 %
PEEP: 14 cmH2O
Patient temperature: 98.6
RATE: 35 resp/min
pCO2 arterial: 57.7 mmHg — ABNORMAL HIGH (ref 32.0–48.0)
pH, Arterial: 7.253 — ABNORMAL LOW (ref 7.350–7.450)
pO2, Arterial: 87.9 mmHg (ref 83.0–108.0)

## 2018-02-13 LAB — RENAL FUNCTION PANEL
Albumin: 1 g/dL — ABNORMAL LOW (ref 3.5–5.0)
Albumin: 1.1 g/dL — ABNORMAL LOW (ref 3.5–5.0)
Anion gap: 17 — ABNORMAL HIGH (ref 5–15)
Anion gap: 16 — ABNORMAL HIGH (ref 5–15)
BUN: 28 mg/dL — ABNORMAL HIGH (ref 6–20)
BUN: 23 mg/dL — ABNORMAL HIGH (ref 6–20)
CO2: 22 mmol/L (ref 22–32)
CO2: 23 mmol/L (ref 22–32)
Calcium: 6.6 mg/dL — ABNORMAL LOW (ref 8.9–10.3)
Calcium: 6.9 mg/dL — ABNORMAL LOW (ref 8.9–10.3)
Chloride: 99 mmol/L (ref 98–111)
Chloride: 100 mmol/L (ref 98–111)
Creatinine, Ser: 2.93 mg/dL — ABNORMAL HIGH (ref 0.61–1.24)
Creatinine, Ser: 2.45 mg/dL — ABNORMAL HIGH (ref 0.61–1.24)
GFR calc Af Amer: 28 mL/min — ABNORMAL LOW (ref >60)
GFR calc Af Amer: 35 mL/min — ABNORMAL LOW (ref >60)
GFR calc non Af Amer: 24 mL/min — ABNORMAL LOW (ref >60)
GFR calc non Af Amer: 30 mL/min — ABNORMAL LOW (ref >60)
Glucose, Bld: 119 mg/dL — ABNORMAL HIGH (ref 70–99)
Glucose, Bld: 85 mg/dL (ref 70–99)
Phosphorus: 4.4 mg/dL (ref 2.5–4.6)
Phosphorus: 3.4 mg/dL (ref 2.5–4.6)
Potassium: 3.6 mmol/L (ref 3.5–5.1)
Potassium: 4 mmol/L (ref 3.5–5.1)
Sodium: 138 mmol/L (ref 135–145)
Sodium: 139 mmol/L (ref 135–145)

## 2018-02-13 LAB — POCT I-STAT 3, ART BLOOD GAS (G3+)
Acid-base deficit: 1 mmol/L (ref 0.0–2.0)
Bicarbonate: 26.3 mmol/L (ref 20.0–28.0)
Bicarbonate: 25.2 mmol/L (ref 20.0–28.0)
O2 Saturation: 95 %
O2 Saturation: 91 %
Patient temperature: 98
Patient temperature: 98
TCO2: 28 mmol/L (ref 22–32)
TCO2: 27 mmol/L (ref 22–32)
pCO2 arterial: 50.4 mmHg — ABNORMAL HIGH (ref 32.0–48.0)
pCO2 arterial: 46.5 mmHg (ref 32.0–48.0)
pH, Arterial: 7.324 — ABNORMAL LOW (ref 7.350–7.450)
pH, Arterial: 7.34 — ABNORMAL LOW (ref 7.350–7.450)
pO2, Arterial: 83 mmHg (ref 83.0–108.0)
pO2, Arterial: 64 mmHg — ABNORMAL LOW (ref 83.0–108.0)

## 2018-02-13 LAB — TRIGLYCERIDES: Triglycerides: 722 mg/dL — ABNORMAL HIGH (ref <150)

## 2018-02-13 LAB — CBC
HCT: 29.4 % — ABNORMAL LOW (ref 39.0–52.0)
Hemoglobin: 10.2 g/dL — ABNORMAL LOW (ref 13.0–17.0)
MCH: 31 pg (ref 26.0–34.0)
MCHC: 34.7 g/dL (ref 30.0–36.0)
MCV: 89.4 fL (ref 80.0–100.0)
Platelets: 161 10*3/uL (ref 150–400)
RBC: 3.29 MIL/uL — ABNORMAL LOW (ref 4.22–5.81)
RDW: 15.8 % — ABNORMAL HIGH (ref 11.5–15.5)
WBC: 24 10*3/uL — ABNORMAL HIGH (ref 4.0–10.5)
nRBC: 0.1 % (ref 0.0–0.2)

## 2018-02-13 LAB — POCT ACTIVATED CLOTTING TIME
Activated Clotting Time: 164 seconds
Activated Clotting Time: 147 seconds
Activated Clotting Time: 158 seconds
Activated Clotting Time: 158 seconds

## 2018-02-13 LAB — HEPATIC FUNCTION PANEL
ALT: 29 U/L (ref 0–44)
AST: 73 U/L — ABNORMAL HIGH (ref 15–41)
Albumin: 1 g/dL — ABNORMAL LOW (ref 3.5–5.0)
Alkaline Phosphatase: 97 U/L (ref 38–126)
Bilirubin, Direct: 0.5 mg/dL — ABNORMAL HIGH (ref 0.0–0.2)
Indirect Bilirubin: 0.4 mg/dL (ref 0.3–0.9)
Total Bilirubin: 0.9 mg/dL (ref 0.3–1.2)
Total Protein: 4.7 g/dL — ABNORMAL LOW (ref 6.5–8.1)

## 2018-02-13 LAB — MAGNESIUM: Magnesium: 2.3 mg/dL (ref 1.7–2.4)

## 2018-02-13 LAB — GLUCOSE, CAPILLARY
Glucose-Capillary: 111 mg/dL — ABNORMAL HIGH (ref 70–99)
Glucose-Capillary: 113 mg/dL — ABNORMAL HIGH (ref 70–99)
Glucose-Capillary: 117 mg/dL — ABNORMAL HIGH (ref 70–99)
Glucose-Capillary: 120 mg/dL — ABNORMAL HIGH (ref 70–99)
Glucose-Capillary: 64 mg/dL — ABNORMAL LOW (ref 70–99)
Glucose-Capillary: 95 mg/dL (ref 70–99)

## 2018-02-13 LAB — PROTIME-INR
INR: 1.07
Prothrombin Time: 13.8 seconds (ref 11.4–15.2)

## 2018-02-13 IMAGING — DX DG CHEST 1V PORT
1 series · 1 of 1 positions shown · non-contrast
Comparison: [DATE]

CLINICAL DATA: Respiratory failure and hypoxia

EXAM:
PORTABLE CHEST 1 VIEW

[chest ap]
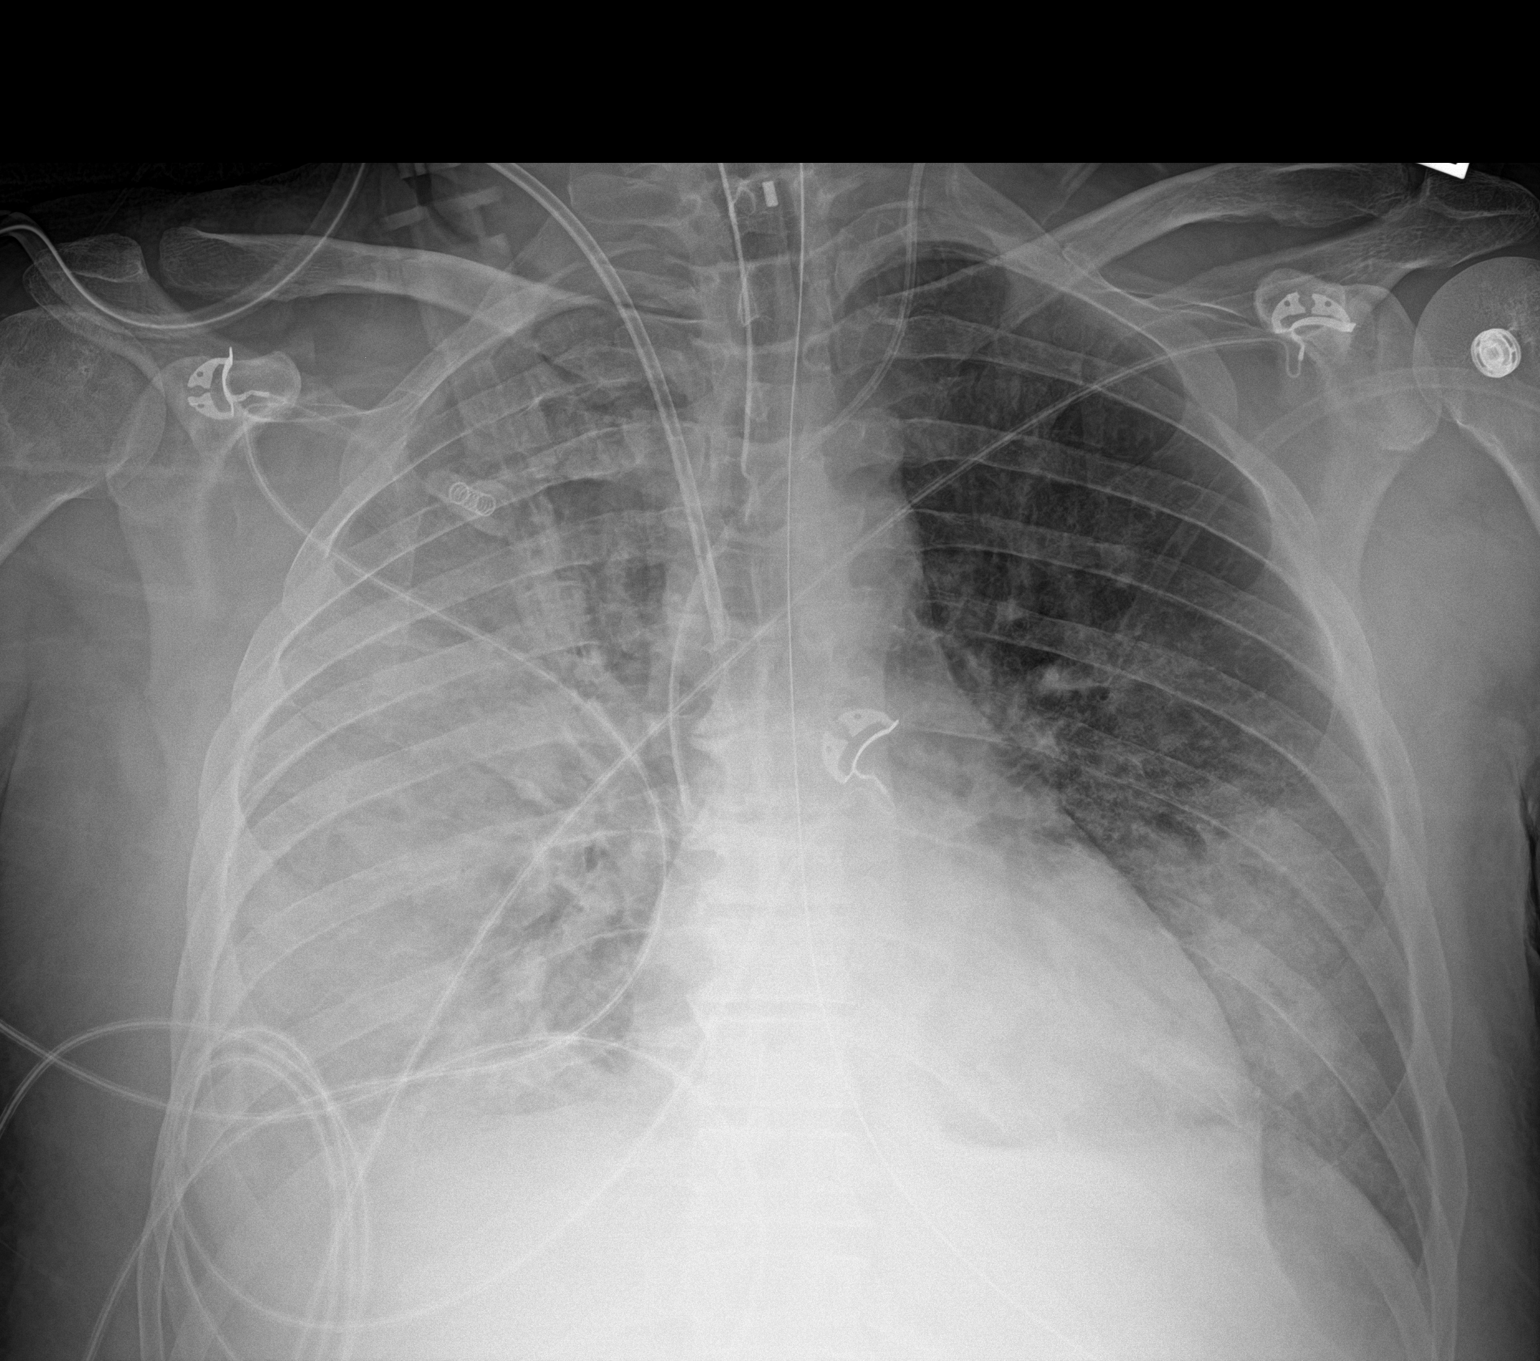

[1 of 1 positions shown; findings below may reference images not displayed]

FINDINGS: Cardiac shadow is stable. Endotracheal tube, nasogastric catheter
and bilateral jugular catheters are again noted and stable. The
lungs are well aerated bilaterally with diffuse infiltrate
throughout the right lung as well as within the left lung base. No
pneumothorax is seen. No bony abnormality is noted.
IMPRESSION: Diffuse infiltrates right greater than left similar to that seen on
the prior exam.

Tubes and lines stable in appearance.

## 2018-02-13 MED ORDER — LEVOFLOXACIN IN D5W 500 MG/100ML IV SOLN
500.0000 mg | Freq: Once | INTRAVENOUS | Status: AC
  Administered 2018-02-13: 500 mg via INTRAVENOUS
  Filled 2018-02-13: qty 100

## 2018-02-13 MED ORDER — SODIUM CHLORIDE 0.9 % IV SOLN
0.0000 mg/h | INTRAVENOUS | Status: DC
  Administered 2018-02-13: 2 mg/h via INTRAVENOUS
  Administered 2018-02-14: 10 mg/h via INTRAVENOUS
  Administered 2018-02-14: 8 mg/h via INTRAVENOUS
  Administered 2018-02-14: 10 mg/h via INTRAVENOUS
  Administered 2018-02-14: 8 mg/h via INTRAVENOUS
  Administered 2018-02-15 (×2): 6 mg/h via INTRAVENOUS
  Administered 2018-02-15: 7 mg/h via INTRAVENOUS
  Administered 2018-02-16: 5 mg/h via INTRAVENOUS
  Administered 2018-02-16: 6 mg/h via INTRAVENOUS
  Administered 2018-02-17: 5 mg/h via INTRAVENOUS
  Administered 2018-02-17: 3 mg/h via INTRAVENOUS
  Administered 2018-02-18: 4 mg/h via INTRAVENOUS
  Filled 2018-02-13 (×13): qty 10

## 2018-02-13 MED ORDER — LEVOFLOXACIN IN D5W 250 MG/50ML IV SOLN
250.0000 mg | INTRAVENOUS | Status: DC
  Administered 2018-02-14 – 2018-02-17 (×4): 250 mg via INTRAVENOUS
  Filled 2018-02-13 (×4): qty 50

## 2018-02-13 MED ORDER — HEPARIN BOLUS VIA INFUSION (CRRT)
1000.0000 [IU] | INTRAVENOUS | Status: DC | PRN
  Filled 2018-02-13: qty 1000

## 2018-02-13 MED ORDER — PRO-STAT SUGAR FREE PO LIQD
30.0000 mL | Freq: Four times a day (QID) | ORAL | Status: DC
  Administered 2018-02-13 – 2018-02-18 (×20): 30 mL
  Filled 2018-02-13 (×20): qty 30

## 2018-02-13 MED ORDER — HYDROCORTISONE NA SUCCINATE PF 100 MG IJ SOLR
50.0000 mg | Freq: Four times a day (QID) | INTRAMUSCULAR | Status: DC
  Administered 2018-02-13 – 2018-02-17 (×17): 50 mg via INTRAVENOUS
  Filled 2018-02-13 (×17): qty 2

## 2018-02-13 MED ORDER — SODIUM CHLORIDE 0.9 % IV SOLN
250.0000 [IU]/h | INTRAVENOUS | Status: DC
  Administered 2018-02-13: 1000 [IU]/h via INTRAVENOUS_CENTRAL
  Administered 2018-02-14: 1900 [IU]/h via INTRAVENOUS_CENTRAL
  Administered 2018-02-14: 2200 [IU]/h via INTRAVENOUS_CENTRAL
  Administered 2018-02-14: 1900 [IU]/h via INTRAVENOUS_CENTRAL
  Administered 2018-02-14: 1700 [IU]/h via INTRAVENOUS_CENTRAL
  Administered 2018-02-14: 1100 [IU]/h via INTRAVENOUS_CENTRAL
  Administered 2018-02-14 – 2018-02-15 (×3): 2200 [IU]/h via INTRAVENOUS_CENTRAL
  Filled 2018-02-13 (×9): qty 2

## 2018-02-13 MED ORDER — MIDAZOLAM BOLUS VIA INFUSION
1.0000 mg | INTRAVENOUS | Status: DC | PRN
  Administered 2018-02-15 – 2018-02-17 (×2): 2 mg via INTRAVENOUS
  Administered 2018-02-17: 1 mg via INTRAVENOUS
  Administered 2018-02-17: 2 mg via INTRAVENOUS
  Filled 2018-02-13: qty 2

## 2018-02-13 MED ORDER — VITAL HIGH PROTEIN PO LIQD
1000.0000 mL | ORAL | Status: DC
  Administered 2018-02-13 – 2018-02-18 (×7): 1000 mL

## 2018-02-13 NOTE — Progress Notes (Signed)
NAME:  Roberto Knapp, MRN:  [id number], DOB:  [birth year], LOS: 4 ADMISSION DATE:  02/09/2018, CONSULTATION DATE:  12/9 REFERRING MD:  Candiss Norse, CHIEF COMPLAINT:  Acute hypoxic respiratory failure    Brief History   21 yom admitted 12/8 w/ CAP, probable gastritis (from NSAIDS) and progressive hypoxic resp failure   History of present illness   47 year old male presented to ED 12/8 w/ cc: fever, body ache, abd pain, N/V, melena, BRBPR, cough and SOB. Initial onset about 10d prior. Initial symptoms were the fever, N/V/D,  body ache, cough shortness of breath for which he was taking advil about every 4 hours, the melena and BRBPR started a few days later. He reported the abd pain as epigastric in nature.  In ER: he was febrile, tachycardic,his RR was in 30s, wbc 18.9, CXR showed marked R>L airspace disease. CT abd/pelvis was neg. FOB pos, influenza PCR neg, UDS was positive for THC. He was admitted to IM service. Started on supplemental oxygen and abx. His respiratory status continued to worsen and by am 12/9 he was titrated up to 100 % on BIPAP w/ marked accessory use and RR in excess of 35 BPM. His HR on tele was in 160s in atrial fib. PCCM asked to see.   Past Medical History  smoker  Significant Hospital Events   12/8 admitted w/ working dx PNA, GIB (prb 2/2 NSAIDs) and acute resp failure 12/9 PCCM called. Resp status progressed. 100% BIPAP. R?L infiltrate on CXR 5/9 paralytics and prone positioning  Consults:  PCCM   Procedures:  OETT 12/9 >>  Art line 12/9 >>  L IJ HD catheter 12/9 >>   Significant Diagnostic Tests:  CT abdomen/pelvis 12/8 >> extensive bilateral basilar airspace disease, no acute findings in the abdomen or pelvis  Micro Data:  Influenza PCR 12/8: negative Strep antigen 12/8: negative Legionella antigen 12/8>>> positive Respiratory culture 12/9>>> BCX2 12/9>>> Urine 12/8 >> negative BAL 12/9 >> negative Pneumocystis DFA 12/9 >> negative cdiff 12/9: negative GI  Panel 12/9>>> negative  Antimicrobials:  Zosyn 12/9>>> 12/12 Vanc 12/8>>> 12/10 azith 12/9>>> 12/12 Levofloxacin 12/12 >>   Interim history/subjective:  Paralytics were discontinued p.m. 12/11 after no twitches on train-of-four.  He has tolerated, remains off. FiO2 weaned to 70% this morning, PEEP remains 14. CVVH initiated, currently on hold due to clotted filter.    Objective   Blood pressure 96/72, pulse 92, temperature (!) 97.3 F (36.3 C), resp. rate (!) 31, height _0  (1.727 m), weight 85.7 kg, SpO2 97 %.    Vent Mode: PRVC FiO2 (%):  [60 %-80 %] 60 % Set Rate:  [35 bmp] 35 bmp Vt Set:  [410 mL] 410 mL PEEP:  [14 cmH20] 14 cmH20 Plateau Pressure:  [25 cmH20-31 cmH20] 31 cmH20   Intake/Output Summary (Last 24 hours) at 02/13/2018 0848 Last data filed at 02/13/2018 0800 Gross per 24 hour  Intake 7802.2 ml  Output 5175 ml  Net 2627.2 ml   Filed Weights   02/11/18 0600 02/12/18 0500 02/13/18 0500  Weight: 83.1 kg 86.4 kg 85.7 kg    Examination: General: Ill-appearing man, ventilated, supine position, hiccups HENT: Endotracheal tube in good position, periorbital edema continues to improve, oropharynx clear Lungs: Initiating some of his own breaths, rarely.  Coarse crackles bilaterally with inspiratory squeaks Cardiovascular: Irregular, good rate control Abdomen: Scant bowel sounds, no distention Extremities: Trace to 1+ extremity edema Neuro: Deeply sedated, does not follow commands, respond to pain GU: Foley in  place, significant scrotal edema  Resolved Hospital Problem list     Assessment & Plan:  Acute Hypoxic respiratory failure in setting of CAP vs aspiration (favor CAP, Legionella antigen positive) w/  ARDS.  No history of vaping in the last year per his wife Plan Okay to hold off on reinitiation of paralytics given our ability to wean his FiO2 with a stable PEEP of 14.  Come down to FiO2 60% now and follow ABG. Antibiotics changed today, Zosyn and  azithromycin changed to levofloxacin Goal for volume removal once CVVH is well-established  Shock, presumed septic shock Plan Norepinephrine added to phenylephrine last night to tolerate CVVH and volume removal.  I will try to wean the phenylephrine as able, continue the norepinephrine as long as it does not exacerbate atrial fibrillation, cause tachycardia Antibiotics as detailed above Add stress dose steroids 12/12  afib w/ RVR; currently has perfusing BP, heart rate controlled Plan Continue current amiodarone infusion.  Could consider transition to enteral if we are able to leave the paralytics off Anticoagulation deferred at this time given recent GI blood loss  Acute renal failure, oliguric Mixed anion gap metabolic and respiratory acidosis Plan CVVH initiated.  Goal volume removal I will stop his bicarb drip today 12/12  GIB: Suspect this 2/2 NSAIDS Plan Continue pantoprazole as ordered Initiate nutrition since his paralytics have been discontinued  Fluid and electrolyte imbalance: hyponatremia, hypokalemia  Plan Bicarbonate discontinued, minimize IV fluids with goal volume removal Follow BMP, replace electrolytes as indicated  Mild LFT elevation, stable to improved 12/12 Plan  Follow LFT intermittently    Best practice:  Diet: Start tube feeds 12/12 Pain/Anxiety/Delirium protocol (if indicated): 12/9 VAP protocol (if indicated): 12/9 DVT prophylaxis: [place] heparin  GI prophylaxis: PPI BID Glucose control: May become necessary with initiation tube feeding Mobility: BR Code Status: full code  Family Communication: Updated the patient's wife and brother at bedside 12/12 Disposition: ICU  Labs   CBC: Recent Labs  Lab 02/09/18 1725  02/10/18 0540 02/10/18 1144 02/10/18 2118 02/11/18 0415 02/12/18 0358 02/13/18 0409  WBC 18.8*  --  18.2*  --   --  26.6* 31.1* 24.0*  NEUTROABS 17.7*  --   --   --   --   --   --   --   HGB 11.6*   < > 10.6* 11.1* 10.5* 11.0*  10.4* 10.2*  HCT 33.7*   < > 31.2* 33.0* 33.3* 34.6* 31.9* 29.4*  MCV 88.5  --  89.9  --   --  96.1 92.7 89.4  PLT 217  --  149*  --   --  272 198 161   < > = values in this interval not displayed.    Basic Metabolic Panel: Recent Labs  Lab 02/09/18 1725  02/11/18 0152 02/11/18 1828 02/12/18 0358 02/12/18 0922 02/12/18 1643 02/13/18 0409  NA 121*   < > 135 133* 134* 136 135 138  K 3.0*   < > 4.4 4.2 4.0 3.6 3.5 3.6  CL 92*   < > 106 102 102 101 99 99  CO2 19*   < > 19* 16* 17* 18* 23 22  GLUCOSE 105*   < > 86 98 121* 119* 120* 119*  BUN 23*   < > 23* 35* 39* 42* 39* 28*  CREATININE 1.20   < > 1.80* 3.30* 3.74* 4.00* 3.69* 2.93*  CALCIUM 7.4*   < > 7.0* 6.8* 6.7* 6.5* 6.4* 6.6*  MG 1.9  --  2.5*  --  2.6*  --   --  2.3  PHOS  --   --   --   --   --   --  6.1* 4.4   < > = values in this interval not displayed.   GFR: Estimated Creatinine Clearance: 32.5 mL/min (A) (by C-G formula based on SCr of 2.93 mg/dL (H)). Recent Labs  Lab 02/09/18 1758 02/09/18 2014 02/09/18 2124 02/10/18 0540 02/10/18 0759 02/11/18 0152 02/11/18 0415 02/12/18 0358 02/13/18 0409  PROCALCITON  --   --  29.93 31.41  --  49.28  --   --   --   WBC  --   --   --  18.2*  --   --  26.6* 31.1* 24.0*  LATICACIDVEN 1.74 1.33  --  1.8 1.5  --   --   --   --     Liver Function Tests: Recent Labs  Lab 02/09/18 1725 02/10/18 0540 02/11/18 0152 02/12/18 1643 02/13/18 0409  AST 136* 115* 110*  --  73*  ALT 57* 46* 35  --  29  ALKPHOS 90 79 76  --  97  BILITOT 1.2 1.0 1.0  --  0.9  PROT 5.6* 5.1* 4.7*  --  4.7*  ALBUMIN 1.7* 1.5* 1.2* <1.0* 1.0*  1.0*   Recent Labs  Lab 02/09/18 1725  LIPASE 21   No results for input(s): AMMONIA in the last 168 hours.  ABG    Component Value Date/Time   PHART 7.253 (L) 02/13/2018 0442   PCO2ART 57.7 (H) 02/13/2018 0442   PO2ART 87.9 02/13/2018 0442   HCO3 24.7 02/13/2018 0442   TCO2 23 02/12/2018 1204   ACIDBASEDEF 1.6 02/13/2018 0442   O2SAT 95.7  02/13/2018 0442     Coagulation Profile: Recent Labs  Lab 02/09/18 1725 02/13/18 0409  INR 1.14 1.07    Cardiac Enzymes: Recent Labs  Lab 02/09/18 2124  TROPONINI 0.05*    HbA1C: No results found for: HGBA1C  CBG: Recent Labs  Lab 02/12/18 1505 02/12/18 1922 02/12/18 2327 02/13/18 0318 02/13/18 0753  GLUCAP 101* 125* 113* 111* 117*    Review of Systems:   Not able   Past Medical History  He,  has a past medical history of Drug overdose, intentional ([place]).   Surgical History    Past Surgical History:  Procedure Laterality Date  . FASCIOTOMY  09/08/2011   Procedure: FASCIOTOMY;  Surgeon: Elam Dutch, MD;  Location: [place];  Service: Vascular;  Laterality: Left;  . FEMORAL-POPLITEAL BYPASS GRAFT  09/08/2011   Procedure: BYPASS GRAFT FEMORAL-POPLITEAL ARTERY;  Surgeon: Elam Dutch, MD;  Location: [facility] OR;  Service: Vascular;  Laterality: Left;  . NO PAST SURGERIES       Social History   reports that he has been smoking cigarettes. He has a 12.50 pack-year smoking history. He has never used smokeless tobacco. He reports current alcohol use. He reports that he does not use drugs.   Family History   His family history includes Hypertension in his father.   Allergies No Known Allergies   Home Medications  Prior to Admission medications   Medication Sig Start Date End Date Taking? Authorizing Provider  Acetaminophen (TYLENOL PO) Take 2 tablets by mouth every 6 (six) hours as needed (pain/fever/headache).   Yes [provider]  ibuprofen (ADVIL,MOTRIN) 200 MG tablet Take 400 mg by mouth every 6 (six) hours as needed for fever, headache, mild pain, moderate pain or cramping.  Yes [provider]  lisdexamfetamine (VYVANSE) 50 MG capsule Take 50 mg by mouth daily.   Yes [provider]     Critical care time: 35 min    Baltazar Apo, MD, PhD 02/13/2018, 8:48 AM [facility] [phone] or if no answer  [phone]

## 2018-02-13 NOTE — Progress Notes (Signed)
Roberto Knapp ROUNDING NOTE   Subjective:   Appears to be a little better this morning decreasing pressor requirements.  Sedated and remains on ventilator family at bedside  Blood pressure 90/62 pulse 81 temperature 97.3 map 81 O2 sats 100% on FiO2 60%  IV phenylephrine 102.8 IV norepinephrine 4.6  IV Levaquin  IV amiodarone  pH 7.2 PCO2 57 PO2 87.9 sodium 138 potassium 3.6 chloride 99 CO2 22 BUN 28 creatinine 2.93 calcium 6.6 phosphorus 4.4 alkaline phosphatase 97 albumin 1.0 ALT 29 AST 73 WBC 24 hemoglobin 10.2 platelets 161 INR 1.07    Objective:  Vital signs in last 24 hours:  Temp:  [94.1 F (34.5 C)-97.9 F (36.6 C)] 97.3 F (36.3 C) (12/12 0900) Pulse Rate:  [51-103] 81 (12/12 0900) Resp:  [0-35] 29 (12/12 0900) BP: (76-115)/(58-85) 90/62 (12/12 0900) SpO2:  [97 %-100 %] 99 % (12/12 0900) Arterial Line BP: (70-117)/(46-74) 104/61 (12/12 0900) FiO2 (%):  [60 %-80 %] 60 % (12/12 0844) Weight:  [85.7 kg] 85.7 kg (12/12 0500)  Weight change: -0.7 kg Filed Weights   02/11/18 0600 02/12/18 0500 02/13/18 0500  Weight: 83.1 kg 86.4 kg 85.7 kg    Intake/Output: I/O last 3 completed shifts: In: 11933.6 [I.V.:11343.8; IV Piggyback:589.8] Out: 1610 [Emesis/NG output:625; Other:4700]   Intake/Output this shift:  Total I/O In: 168.9 [I.V.:168.9] Out: 26 [Other:26] ET tube placed ventilator CVS-tachycardic no rubs gallops RS-coarse respiratory breath sounds ABD-hypoactive bowel sounds EXT- no edema   Basic Metabolic Panel: Recent Labs  Lab 02/09/18 1725  02/11/18 0152 02/11/18 1828 02/12/18 0358 02/12/18 0922 02/12/18 1643 02/13/18 0409  NA 121*   < > 135 133* 134* 136 135 138  K 3.0*   < > 4.4 4.2 4.0 3.6 3.5 3.6  CL 92*   < > 106 102 102 101 99 99  CO2 19*   < > 19* 16* 17* 18* 23 22  GLUCOSE 105*   < > 86 98 121* 119* 120* 119*  BUN 23*   < > 23* 35* 39* 42* 39* 28*  CREATININE 1.20   < > 1.80* 3.30* 3.74* 4.00* 3.69* 2.93*  CALCIUM  7.4*   < > 7.0* 6.8* 6.7* 6.5* 6.4* 6.6*  MG 1.9  --  2.5*  --  2.6*  --   --  2.3  PHOS  --   --   --   --   --   --  6.1* 4.4   < > = values in this interval not displayed.    Liver Function Tests: Recent Labs  Lab 02/09/18 1725 02/10/18 0540 02/11/18 0152 02/12/18 1643 02/13/18 0409  AST 136* 115* 110*  --  73*  ALT 57* 46* 35  --  29  ALKPHOS 90 79 76  --  97  BILITOT 1.2 1.0 1.0  --  0.9  PROT 5.6* 5.1* 4.7*  --  4.7*  ALBUMIN 1.7* 1.5* 1.2* <1.0* 1.0*  1.0*   Recent Labs  Lab 02/09/18 1725  LIPASE 21   No results for input(s): AMMONIA in the last 168 hours.  CBC: Recent Labs  Lab 02/09/18 1725  02/10/18 0540 02/10/18 1144 02/10/18 2118 02/11/18 0415 02/12/18 0358 02/13/18 0409  WBC 18.8*  --  18.2*  --   --  26.6* 31.1* 24.0*  NEUTROABS 17.7*  --   --   --   --   --   --   --   HGB 11.6*   < > 10.6* 11.1* 10.5*  11.0* 10.4* 10.2*  HCT 33.7*   < > 31.2* 33.0* 33.3* 34.6* 31.9* 29.4*  MCV 88.5  --  89.9  --   --  96.1 92.7 89.4  PLT 217  --  149*  --   --  272 198 161   < > = values in this interval not displayed.    Cardiac Enzymes: Recent Labs  Lab 02/09/18 2124  TROPONINI 0.05*    BNP: Invalid input(s): POCBNP  CBG: Recent Labs  Lab 02/12/18 1505 02/12/18 1922 02/12/18 2327 02/13/18 0318 02/13/18 0753  GLUCAP 101* 125* 113* 111* 117*    Microbiology: Results for orders placed or performed during the hospital encounter of 02/09/18  Culture, blood (routine x 2)     Status: None (Preliminary result)   Collection Time: 02/09/18  5:30 PM  Result Value Ref Range Status   Specimen Description BLOOD LEFT ANTECUBITAL  Final   Special Requests   Final    BOTTLES DRAWN AEROBIC AND ANAEROBIC Blood Culture adequate volume   Culture   Final    NO GROWTH 3 DAYS Performed at [facility], [place] [address] [place], [place] [address]    Report Status PENDING  Incomplete  Urine culture     Status: None   Collection Time: 02/09/18  6:16 PM   Result Value Ref Range Status   Specimen Description URINE, RANDOM  Final   Special Requests NONE  Final   Culture   Final    NO GROWTH Performed at [facility], [address], [place], [place] [address]    Report Status 02/10/2018 FINAL  Final  Culture, blood (routine x 2)     Status: None (Preliminary result)   Collection Time: 02/09/18  6:16 PM  Result Value Ref Range Status   Specimen Description BLOOD BLOOD RIGHT WRIST  Final   Special Requests   Final    BOTTLES DRAWN AEROBIC AND ANAEROBIC Blood Culture adequate volume   Culture   Final    NO GROWTH 3 DAYS Performed at [facility], [place] [address], [place], [place] [address]    Report Status PENDING  Incomplete  Gastrointestinal Panel by PCR , Stool     Status: None   Collection Time: 02/09/18  6:36 PM  Result Value Ref Range Status   Campylobacter species NOT DETECTED NOT DETECTED Final   Plesimonas shigelloides NOT DETECTED NOT DETECTED Final   Salmonella species NOT DETECTED NOT DETECTED Final   Yersinia enterocolitica NOT DETECTED NOT DETECTED Final   Vibrio species NOT DETECTED NOT DETECTED Final   Vibrio cholerae NOT DETECTED NOT DETECTED Final   Enteroaggregative E coli (EAEC) NOT DETECTED NOT DETECTED Final   Enteropathogenic E coli (EPEC) NOT DETECTED NOT DETECTED Final   Enterotoxigenic E coli (ETEC) NOT DETECTED NOT DETECTED Final   Shiga like toxin producing E coli (STEC) NOT DETECTED NOT DETECTED Final   Shigella/Enteroinvasive E coli (EIEC) NOT DETECTED NOT DETECTED Final   Cryptosporidium NOT DETECTED NOT DETECTED Final   Cyclospora cayetanensis NOT DETECTED NOT DETECTED Final   Entamoeba histolytica NOT DETECTED NOT DETECTED Final   Giardia lamblia NOT DETECTED NOT DETECTED Final   Adenovirus F40/41 NOT DETECTED NOT DETECTED Final   Astrovirus NOT DETECTED NOT DETECTED Final   Norovirus GI/GII NOT DETECTED NOT DETECTED Final   Rotavirus A NOT DETECTED NOT DETECTED Final   Sapovirus (I,  II, IV, and V) NOT DETECTED NOT DETECTED Final    Comment: Performed  at [facility], [place] [address] [place], [place] [address]  C difficile quick scan w PCR reflex     Status: None   Collection Time: 02/09/18  6:36 PM  Result Value Ref Range Status   C Diff antigen NEGATIVE NEGATIVE Final   C Diff toxin NEGATIVE NEGATIVE Final   C Diff interpretation No C. difficile detected.  Final    Comment: Performed at [facility], [place] [address], [place], [place] [address]  MRSA PCR Screening     Status: None   Collection Time: 02/10/18  1:34 AM  Result Value Ref Range Status   MRSA by PCR NEGATIVE NEGATIVE Final    Comment:        The GeneXpert MRSA Assay (FDA approved for NASAL specimens only), is one component of a comprehensive MRSA colonization surveillance program. It is not intended to diagnose MRSA infection nor to guide or monitor treatment for MRSA infections. Performed at [facility], [place] [address] [place], [place] [address]   Culture, bal-quantitative     Status: None   Collection Time: 02/10/18  1:26 PM  Result Value Ref Range Status   Specimen Description BRONCHIAL ALVEOLAR LAVAGE  Final   Special Requests Normal  Final   Gram Stain   Final    FEW WBC PRESENT,BOTH PMN AND MONONUCLEAR NO ORGANISMS SEEN    Culture   Final    NO GROWTH 2 DAYS Performed at [facility], [place] [address] [place], [place] [address]    Report Status 02/12/2018 FINAL  Final  Pneumocystis smear by DFA     Status: None   Collection Time: 02/10/18  1:26 PM  Result Value Ref Range Status   Specimen Source-PJSRC BRONCHIAL ALVEOLAR LAVAGE  Final   Pneumocystis jiroveci Ag NEGATIVE  Final    Comment: Performed at [facility] Performed at [facility], [address] [place], [place] [address]     Coagulation Studies: Recent Labs    02/13/18 0409  LABPROT 13.8  INR 1.07    Urinalysis: No results for input(s): COLORURINE, LABSPEC, PHURINE,  GLUCOSEU, HGBUR, BILIRUBINUR, KETONESUR, PROTEINUR, UROBILINOGEN, NITRITE, LEUKOCYTESUR in the last 72 hours.  Invalid input(s): APPERANCEUR    Imaging: Dg Chest Port 1 View  Result Date: 02/13/2018 CLINICAL DATA:  Respiratory failure and hypoxia EXAM: PORTABLE CHEST 1 VIEW COMPARISON:  02/12/2018 FINDINGS: Cardiac shadow is stable. Endotracheal tube, nasogastric catheter and bilateral jugular catheters are again noted and stable. The lungs are well aerated bilaterally with diffuse infiltrate throughout the right lung as well as within the left lung base. No pneumothorax is seen. No bony abnormality is noted. IMPRESSION: Diffuse infiltrates right greater than left similar to that seen on the prior exam. Tubes and lines stable in appearance. Electronically Signed   By: Inez Catalina M.D.   On: 02/13/2018 07:52   Dg Chest Port 1 View  Result Date: 02/12/2018 CLINICAL DATA:  Status post central line placement. EXAM: PORTABLE CHEST 1 VIEW COMPARISON:  02/12/2018 at 0513 hours FINDINGS: Endotracheal tube and left jugular catheter are unchanged. Enteric tube courses into the left upper abdomen with tip not imaged. A new right jugular catheter terminates over the mid to upper SVC. Extensive airspace consolidation throughout the majority of the right lung and throughout the left lower lobe is unchanged. There may be a small right pleural effusion. No pneumothorax is identified. IMPRESSION: 1. New right jugular catheter terminates over the mid to upper SVC.  No pneumothorax. 2. Unchanged extensive bilateral airspace disease. Electronically Signed   By: Logan Bores M.D.   On: 02/12/2018 12:26   Dg Chest Port 1 View  Result Date: 02/12/2018 CLINICAL DATA:  47 y/o M; respiratory failure and shortness of breath. EXAM: PORTABLE CHEST 1 VIEW COMPARISON:  02/11/2018 chest radiograph FINDINGS: Stable endotracheal tube and left central venous catheter. Enteric tube extends below field of view and abdomen. Stable  right-greater-than-left diffuse pulmonary consolidation. No pneumothorax. Bones are unremarkable. IMPRESSION: Stable lines and tubes.  Stable severe airspace disease. Electronically Signed   By: Kristine Garbe M.D.   On: 02/12/2018 05:36   Dg Chest Port 1 View  Result Date: 02/11/2018 CLINICAL DATA:  Acute respiratory failure with hypoxia EXAM: PORTABLE CHEST 1 VIEW COMPARISON:  Yesterday FINDINGS: Extensive airspace disease asymmetric to the right. There is reported history of pneumonia and sepsis. Normal heart size. No effusion or pneumothorax. Endotracheal tube with tip between the clavicular heads and carina. Left IJ line with tip at the upper cavoatrial junction. An orogastric tube at least reaches the stomach. IMPRESSION: Stable hardware positioning and severe bilateral airspace disease. Electronically Signed   By: Monte Fantasia M.D.   On: 02/11/2018 12:55     Medications:   .  prismasol BGK 4/2.5 500 mL/hr at 02/13/18 0149  .  prismasol BGK 4/2.5 500 mL/hr at 02/13/18 0205  . sodium chloride    . amiodarone 30 mg/hr (02/13/18 0800)  . fentaNYL infusion INTRAVENOUS 250 mcg/hr (02/13/18 0800)  . [START ON 02/14/2018] levofloxacin (LEVAQUIN) IV    . levofloxacin (LEVAQUIN) IV    . norepinephrine (LEVOPHED) Adult infusion 3 mcg/min (02/13/18 0800)  . phenylephrine (NEO-SYNEPHRINE) Adult infusion 270 mcg/min (02/13/18 0800)  . prismasol BGK 4/2.5 2,000 mL/hr at 02/13/18 0733  . propofol (DIPRIVAN) infusion 30 mcg/kg/min (02/13/18 0800)  . sodium chloride     . chlorhexidine gluconate (MEDLINE KIT)  15 mL Mouth Rinse BID  . Chlorhexidine Gluconate Cloth  6 each Topical Q0600  . hydrocortisone sod succinate (SOLU-CORTEF) inj  50 mg Intravenous Q6H  . Influenza vac split quadrivalent PF  0.5 mL Intramuscular Tomorrow-1000  . mouth rinse  15 mL Mouth Rinse 10 times per day  . pantoprazole (PROTONIX) IV  40 mg Intravenous Q12H  . sodium chloride flush  3 mL Intravenous Q12H    sodium chloride, acetaminophen, acetaminophen **OR** [DISCONTINUED] acetaminophen, fentaNYL, fentaNYL, fentaNYL (SUBLIMAZE) injection, heparin, levalbuterol, [DISCONTINUED] ondansetron **OR** ondansetron (ZOFRAN) IV, sodium chloride  Assessment/ Plan:   Acute kidney injury in setting of hypotension sepsis nonsteroidal anti-inflammatory drug use and CT scan of abdomen pelvis performed with IV contrast on 02/07/2018.  CRRT initiated appears to be stable on this therapy will continue.  Urinalysis and urine sodium pending  Hypotension requiring phenylephrine and I would leave this at that EF 40 to 45% secondary to septic shock.  Sepsis now changed to Levaquin IV  Metabolic acidosis IV bicarbonate discontinued appears to be stable  Atrial fibrillation secondary to sepsis continues on IV amiodarone  GI prophylaxis with Protonix 40 mg every 12 hours.   LOS: [place] _0 _1 :43 AM

## 2018-02-13 NOTE — Progress Notes (Signed)
Nutrition Follow-up  DOCUMENTATION CODES:   Not applicable  INTERVENTION:    Vital High Protein at 50 ml/h (1200 ml per day)  Pro-stat 30 ml QID  Provides 1600 kcal (2051 kcal total with current propofol), 165 gm protein, 1003 ml free water daily  NUTRITION DIAGNOSIS:   Inadequate oral intake related to inability to eat as evidenced by NPO status.  Ongoing   GOAL:   Patient will meet greater than or equal to 90% of their needs  Progressing, TF being initiated today  MONITOR:   Vent status, Labs, Skin, I & O's  REASON FOR ASSESSMENT:   Consult Enteral/tube feeding initiation and management  ASSESSMENT:   47 yo male with PMH of drug overdose, DM, and renal insufficiency who was admitted on 12/8 with CAD, gastritis (from NSAIDS) and progressive respiratory failure.   Current medical issues include AKI, hypotension, sepsis, metabolic acidosis.  Received MD Consult for TF initiation and management. CRRT was initiated yesterday afternoon.    Patient remains intubated on ventilator support MV: 14.1 L/min Temp (24hrs), Avg:96.3 F (35.7 C), Min:94.1 F (34.5 C), Max:97.9 F (36.6 C)  Propofol: 17.1 ml/hr providing 451 kcal from lipid    Labs reviewed. Medications reviewed and include levophed, neosynephrine, propofol, IV bicarb.  Nimbex d/c'ed.   Diet Order:   Diet Order    None      EDUCATION NEEDS:   No education needs have been identified at this time  Skin:  Skin Assessment: Reviewed RN Assessment  Last BM:  12/8  Height:   Ht Readings from Last 1 Encounters:  02/10/18 5\' 8"  (1.727 m)    Weight:   Wt Readings from Last 1 Encounters:  02/13/18 85.7 kg   71.4 kg on admission Weight up with positive volume status  Ideal Body Weight:  70 kg  BMI:  Body mass index is 28.73 kg/m.  Estimated Nutritional Needs:   Kcal:  2050  Protein:  150-175 gm  Fluid:  >/= 2 L    Joaquin CourtsKimberly Gaelle Adriance, RD, LDN, CNSC Pager [phone] After Hours  Pager [phone]

## 2018-02-13 NOTE — Progress Notes (Signed)
Was informed by nursing that pt is clotting off systems with CRRT on no heparin- hgb over 10- plt over 100- will add heparin and restart system  Cecille AverKellie A Maurina Fawaz

## 2018-02-14 ENCOUNTER — Inpatient Hospital Stay (HOSPITAL_COMMUNITY): Payer: BLUE CROSS/BLUE SHIELD

## 2018-02-14 LAB — POCT ACTIVATED CLOTTING TIME
Activated Clotting Time: 158 seconds
Activated Clotting Time: 163 seconds
Activated Clotting Time: 164 seconds
Activated Clotting Time: 164 seconds
Activated Clotting Time: 169 seconds
Activated Clotting Time: 169 seconds
Activated Clotting Time: 169 seconds
Activated Clotting Time: 169 seconds
Activated Clotting Time: 169 seconds
Activated Clotting Time: 169 seconds
Activated Clotting Time: 169 seconds
Activated Clotting Time: 169 seconds
Activated Clotting Time: 175 seconds
Activated Clotting Time: 180 seconds
Activated Clotting Time: 180 seconds
Activated Clotting Time: 191 seconds
Activated Clotting Time: 191 seconds
Activated Clotting Time: 197 seconds
Activated Clotting Time: 197 seconds
Activated Clotting Time: 197 seconds
Activated Clotting Time: 208 seconds

## 2018-02-14 LAB — RENAL FUNCTION PANEL
Albumin: 1.1 g/dL — ABNORMAL LOW (ref 3.5–5.0)
Albumin: 1.2 g/dL — ABNORMAL LOW (ref 3.5–5.0)
Anion gap: 12 (ref 5–15)
Anion gap: 13 (ref 5–15)
BUN: 23 mg/dL — ABNORMAL HIGH (ref 6–20)
BUN: 30 mg/dL — ABNORMAL HIGH (ref 6–20)
CO2: 25 mmol/L (ref 22–32)
CO2: 25 mmol/L (ref 22–32)
Calcium: 7.2 mg/dL — ABNORMAL LOW (ref 8.9–10.3)
Calcium: 7.4 mg/dL — ABNORMAL LOW (ref 8.9–10.3)
Chloride: 102 mmol/L (ref 98–111)
Chloride: 102 mmol/L (ref 98–111)
Creatinine, Ser: 2.1 mg/dL — ABNORMAL HIGH (ref 0.61–1.24)
Creatinine, Ser: 2.08 mg/dL — ABNORMAL HIGH (ref 0.61–1.24)
GFR calc Af Amer: 42 mL/min — ABNORMAL LOW (ref >60)
GFR calc Af Amer: 42 mL/min — ABNORMAL LOW (ref >60)
GFR calc non Af Amer: 36 mL/min — ABNORMAL LOW (ref >60)
GFR calc non Af Amer: 36 mL/min — ABNORMAL LOW (ref >60)
Glucose, Bld: 129 mg/dL — ABNORMAL HIGH (ref 70–99)
Glucose, Bld: 130 mg/dL — ABNORMAL HIGH (ref 70–99)
Phosphorus: 3.5 mg/dL (ref 2.5–4.6)
Phosphorus: 2.6 mg/dL (ref 2.5–4.6)
Potassium: 3.9 mmol/L (ref 3.5–5.1)
Potassium: 3.7 mmol/L (ref 3.5–5.1)
Sodium: 139 mmol/L (ref 135–145)
Sodium: 140 mmol/L (ref 135–145)

## 2018-02-14 LAB — POCT I-STAT 3, ART BLOOD GAS (G3+)
Acid-Base Excess: 1 mmol/L (ref 0.0–2.0)
Bicarbonate: 27.2 mmol/L (ref 20.0–28.0)
O2 Saturation: 93 %
Patient temperature: 97.5
TCO2: 29 mmol/L (ref 22–32)
pCO2 arterial: 49.4 mmHg — ABNORMAL HIGH (ref 32.0–48.0)
pH, Arterial: 7.347 — ABNORMAL LOW (ref 7.350–7.450)
pO2, Arterial: 70 mmHg — ABNORMAL LOW (ref 83.0–108.0)

## 2018-02-14 LAB — CULTURE, BLOOD (ROUTINE X 2)
Culture: NO GROWTH
Culture: NO GROWTH
Special Requests: ADEQUATE
Special Requests: ADEQUATE

## 2018-02-14 LAB — GLUCOSE, CAPILLARY
Glucose-Capillary: 110 mg/dL — ABNORMAL HIGH (ref 70–99)
Glucose-Capillary: 115 mg/dL — ABNORMAL HIGH (ref 70–99)
Glucose-Capillary: 121 mg/dL — ABNORMAL HIGH (ref 70–99)
Glucose-Capillary: 124 mg/dL — ABNORMAL HIGH (ref 70–99)
Glucose-Capillary: 125 mg/dL — ABNORMAL HIGH (ref 70–99)
Glucose-Capillary: 128 mg/dL — ABNORMAL HIGH (ref 70–99)

## 2018-02-14 LAB — CBC
HCT: 26 % — ABNORMAL LOW (ref 39.0–52.0)
Hemoglobin: 8.8 g/dL — ABNORMAL LOW (ref 13.0–17.0)
MCH: 30.1 pg (ref 26.0–34.0)
MCHC: 33.8 g/dL (ref 30.0–36.0)
MCV: 89 fL (ref 80.0–100.0)
Platelets: 136 10*3/uL — ABNORMAL LOW (ref 150–400)
RBC: 2.92 MIL/uL — ABNORMAL LOW (ref 4.22–5.81)
RDW: 15.9 % — ABNORMAL HIGH (ref 11.5–15.5)
WBC: 18.6 10*3/uL — ABNORMAL HIGH (ref 4.0–10.5)
nRBC: 0.1 % (ref 0.0–0.2)

## 2018-02-14 LAB — MAGNESIUM: Magnesium: 2.5 mg/dL — ABNORMAL HIGH (ref 1.7–2.4)

## 2018-02-14 LAB — APTT: aPTT: 54 seconds — ABNORMAL HIGH (ref 24–36)

## 2018-02-14 IMAGING — DX DG CHEST 1V PORT
1 series · 1 of 1 positions shown · non-contrast
Comparison: [DATE]

CLINICAL DATA: Acute respiratory failure with hypoxia

EXAM:
PORTABLE CHEST 1 VIEW

[chest]
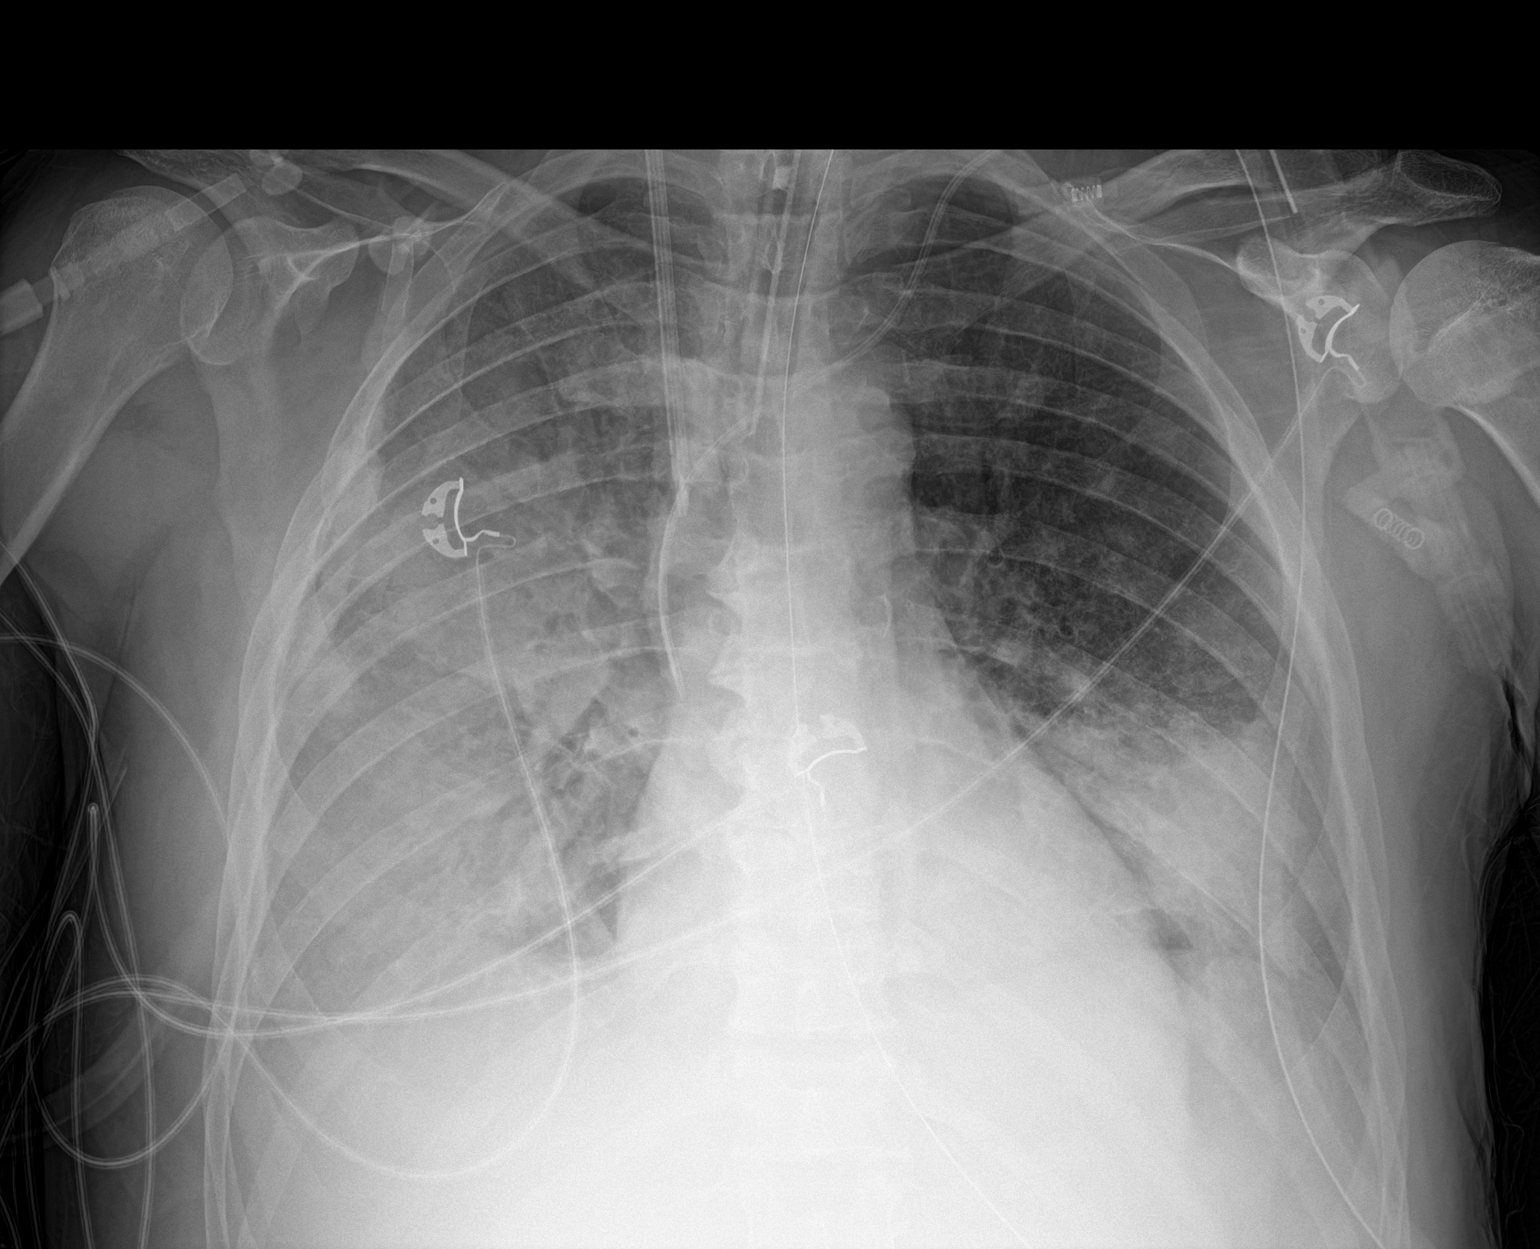

[1 of 1 positions shown; findings below may reference images not displayed]

FINDINGS: Endotracheal tube in good position. New right jugular central venous
catheter tip in the SVC. Left jugular central venous catheter tip in
the lower SVC. No pneumothorax. NG tube enters the stomach

Extensive bilateral airspace disease right greater than left appears
stable.
IMPRESSION: Extensive bilateral airspace disease right greater than left is
unchanged.

## 2018-02-14 NOTE — Progress Notes (Signed)
NAME:  Roberto Knapp, MRN:  [id number], DOB:  Dec 27, 1970, LOS: 5 ADMISSION DATE:  02/09/2018, CONSULTATION DATE:  12/9 REFERRING MD:  Candiss Norse, CHIEF COMPLAINT:  Acute hypoxic respiratory failure    Brief History   1 yom admitted 12/8 w/ CAP, probable gastritis (from NSAIDS) and progressive hypoxic resp failure   History of present illness   47 year old male presented to ED 12/8 w/ cc: fever, body ache, abd pain, N/V, melena, BRBPR, cough and SOB. Initial onset about 10d prior. Initial symptoms were the fever, N/V/D,  body ache, cough shortness of breath for which he was taking advil about every 4 hours, the melena and BRBPR started a few days later. He reported the abd pain as epigastric in nature.  In ER: he was febrile, tachycardic,his RR was in 30s, wbc 18.9, CXR showed marked R>L airspace disease. CT abd/pelvis was neg. FOB pos, influenza PCR neg, UDS was positive for THC. He was admitted to IM service. Started on supplemental oxygen and abx. His respiratory status continued to worsen and by am 12/9 he was titrated up to 100 % on BIPAP w/ marked accessory use and RR in excess of 35 BPM. His HR on tele was in 160s in atrial fib. PCCM asked to see.   Past Medical History  smoker  Significant Hospital Events   12/8 admitted w/ working dx PNA, GIB (prb 2/2 NSAIDs) and acute resp failure 12/9 PCCM called. Resp status progressed. 100% BIPAP. R?L infiltrate on CXR 5/9 paralytics and prone positioning  Consults:  PCCM   Procedures:  OETT 12/9 >>  Art line 12/9 >>  L IJ HD catheter 12/9 >>   Significant Diagnostic Tests:  CT abdomen/pelvis 12/8 >> extensive bilateral basilar airspace disease, no acute findings in the abdomen or pelvis  Micro Data:  Influenza PCR 12/8: negative Strep antigen 12/8: negative Legionella antigen 12/8>>> positive Respiratory culture 12/9>>> BCX2 12/9>>> Urine 12/8 >> negative BAL 12/9 >> negative Pneumocystis DFA 12/9 >> negative cdiff 12/9: negative GI  Panel 12/9>>> negative  Antimicrobials:  Zosyn 12/9>>> 12/12 Vanc 12/8>>> 12/10 azith 12/9>>> 12/12 Levofloxacin 12/12 >>   Interim history/subjective:  Paralytics remain off Weaning PEEP and FiO2 Now appears to be in sinus rhythm Pressors have been weaned to off   Objective   Blood pressure 113/73, pulse 78, temperature (!) 97.2 F (36.2 C), resp. rate (!) 37, height _0  (1.727 m), weight 82.3 kg, SpO2 94 %.    Vent Mode: PRVC FiO2 (%):  [50 %-60 %] 50 % Set Rate:  [35 bmp] 35 bmp Vt Set:  [410 mL] 410 mL PEEP:  [10 cmH20] 10 cmH20 Plateau Pressure:  [20 cmH20-27 cmH20] 27 cmH20   Intake/Output Summary (Last 24 hours) at 02/14/2018 1245 Last data filed at 02/14/2018 1200 Gross per 24 hour  Intake 3519.39 ml  Output 5960 ml  Net -2440.61 ml   Filed Weights   02/12/18 0500 02/13/18 0500 02/14/18 0100  Weight: 86.4 kg 85.7 kg 82.3 kg    Examination: General: Ill-appearing man, ventilated, supine position HENT: Endotracheal tube in good position, periorbital edema continues to improve, oropharynx clear Lungs: Crackles bilaterally with some inspiratory squeaks Cardiovascular: Regular, heart rate 70 Abdomen: Scant bowel sounds, no distention Extremities: 1+ edema Neuro: Deeply sedated, does not follow commands, respond to pain GU: Foley in place, significant scrotal edema  Resolved Hospital Problem list     Assessment & Plan:  Acute Hypoxic respiratory failure in setting of CAP vs aspiration (  favor CAP, Legionella antigen positive) w/  ARDS.  No history of vaping in the last year per his wife Plan Continue to wean PEEP and FiO2 as able.  Hopefully will be able to start lightening sedation soon, that his respiratory status will tolerate Continue levofloxacin monotherapy Goal for removal via CVVH  Shock, presumed septic shock Plan Pressors weaned to off Antibiotics as above Continue stress dose hydrocortisone for 4 more days  afib w/ RVR; currently has  perfusing BP, heart rate controlled Plan Now back in sinus rhythm.  Strongly consider discontinuation amiodarone on 12/13, follow for any rhythm instability Anticoagulation deferred at this time given recent GI blood loss  Acute renal failure, oliguric Mixed anion gap metabolic and respiratory acidosis Plan Continue CVVHD.  Appreciate nephrology assistance. Bicarbonate drip discontinued  GIB: Suspect this 2/2 NSAIDS Plan Continue pantoprazole as ordered Okay to start nutrition 12/13  Fluid and electrolyte imbalance: hyponatremia, hypokalemia  Plan Minimize IV fluids Follow BMP, replace electrolytes as indicated  Mild LFT elevation, stable to improved 12/12 Plan  Follow LFT intermittently    Best practice:  Diet: Start tube feeds 12/13 Pain/Anxiety/Delirium protocol (if indicated): 12/9 VAP protocol (if indicated): 12/9 DVT prophylaxis: [place] heparin  GI prophylaxis: PPI BID Glucose control: May become necessary with initiation tube feeding Mobility: BR Code Status: full code  Family Communication: Updated the patient's wife and brother at bedside 12/13 Disposition: ICU  Labs   CBC: Recent Labs  Lab 02/09/18 1725  02/10/18 0540  02/10/18 2118 02/11/18 0415 02/12/18 0358 02/13/18 0409 02/14/18 0320  WBC 18.8*  --  18.2*  --   --  26.6* 31.1* 24.0* 18.6*  NEUTROABS 17.7*  --   --   --   --   --   --   --   --   HGB 11.6*   < > 10.6*   < > 10.5* 11.0* 10.4* 10.2* 8.8*  HCT 33.7*   < > 31.2*   < > 33.3* 34.6* 31.9* 29.4* 26.0*  MCV 88.5  --  89.9  --   --  96.1 92.7 89.4 89.0  PLT 217  --  149*  --   --  272 198 161 136*   < > = values in this interval not displayed.    Basic Metabolic Panel: Recent Labs  Lab 02/09/18 1725  02/11/18 0152  02/12/18 0358 02/12/18 0922 02/12/18 1643 02/13/18 0409 02/13/18 1555 02/14/18 0320  NA 121*   < > 135   < > 134* 136 135 138 139 139  K 3.0*   < > 4.4   < > 4.0 3.6 3.5 3.6 4.0 3.9  CL 92*   < > 106   < > 102 101 99  99 100 102  CO2 19*   < > 19*   < > 17* 18* _0 GLUCOSE 105*   < > 86   < > 121* 119* 120* 119* 85 129*  BUN 23*   < > 23*   < > 39* 42* 39* 28* 23* 23*  CREATININE 1.20   < > 1.80*   < > 3.74* 4.00* 3.69* 2.93* 2.45* 2.10*  CALCIUM 7.4*   < > 7.0*   < > 6.7* 6.5* 6.4* 6.6* 6.9* 7.2*  MG 1.9  --  2.5*  --  2.6*  --   --  2.3  --  2.5*  PHOS  --   --   --   --   --   --  6.1* 4.4 3.4 3.5   < > = values in this interval not displayed.   GFR: Estimated Creatinine Clearance: 44.5 mL/min (A) (by C-G formula based on SCr of 2.1 mg/dL (H)). Recent Labs  Lab 02/09/18 1758 02/09/18 2014 02/09/18 2124 02/10/18 0540 02/10/18 0759 02/11/18 0152 02/11/18 0415 02/12/18 0358 02/13/18 0409 02/14/18 0320  PROCALCITON  --   --  29.93 31.41  --  49.28  --   --   --   --   WBC  --   --   --  18.2*  --   --  26.6* 31.1* 24.0* 18.6*  LATICACIDVEN 1.74 1.33  --  1.8 1.5  --   --   --   --   --     Liver Function Tests: Recent Labs  Lab 02/09/18 1725 02/10/18 0540 02/11/18 0152 02/12/18 1643 02/13/18 0409 02/13/18 1555 02/14/18 0320  AST 136* 115* 110*  --  73*  --   --   ALT 57* 46* 35  --  29  --   --   ALKPHOS 90 79 76  --  97  --   --   BILITOT 1.2 1.0 1.0  --  0.9  --   --   PROT 5.6* 5.1* 4.7*  --  4.7*  --   --   ALBUMIN 1.7* 1.5* 1.2* <1.0* 1.0*  1.0* 1.1* 1.1*   Recent Labs  Lab 02/09/18 1725  LIPASE 21   No results for input(s): AMMONIA in the last 168 hours.  ABG    Component Value Date/Time   PHART 7.347 (L) 02/14/2018 0935   PCO2ART 49.4 (H) 02/14/2018 0935   PO2ART 70.0 (L) 02/14/2018 0935   HCO3 27.2 02/14/2018 0935   TCO2 29 02/14/2018 0935   ACIDBASEDEF 1.0 02/13/2018 1335   O2SAT 93.0 02/14/2018 0935     Coagulation Profile: Recent Labs  Lab 02/09/18 1725 02/13/18 0409  INR 1.14 1.07    Cardiac Enzymes: Recent Labs  Lab 02/09/18 2124  TROPONINI 0.05*    HbA1C: No results found for: HGBA1C  CBG: Recent Labs  Lab 02/13/18 1932  02/13/18 2342 02/14/18 0320 02/14/18 0803 02/14/18 1120  GLUCAP 120* 113* 124* 115* 128*    Review of Systems:   Not able   Past Medical History  He,  has a past medical history of Drug overdose, intentional ([place]).   Surgical History    Past Surgical History:  Procedure Laterality Date  . FASCIOTOMY  09/08/2011   Procedure: FASCIOTOMY;  Surgeon: Elam Dutch, MD;  Location: [place];  Service: Vascular;  Laterality: Left;  . FEMORAL-POPLITEAL BYPASS GRAFT  09/08/2011   Procedure: BYPASS GRAFT FEMORAL-POPLITEAL ARTERY;  Surgeon: Elam Dutch, MD;  Location: [facility] OR;  Service: Vascular;  Laterality: Left;  . NO PAST SURGERIES       Social History   reports that he has been smoking cigarettes. He has a 12.50 pack-year smoking history. He has never used smokeless tobacco. He reports current alcohol use. He reports that he does not use drugs.   Family History   His family history includes Hypertension in his father.   Allergies No Known Allergies   Home Medications  Prior to Admission medications   Medication Sig Start Date End Date Taking? Authorizing Provider  Acetaminophen (TYLENOL PO) Take 2 tablets by mouth every 6 (six) hours as needed (pain/fever/headache).   Yes [provider]  ibuprofen (ADVIL,MOTRIN) 200 MG tablet Take 400 mg by mouth every  6 (six) hours as needed for fever, headache, mild pain, moderate pain or cramping.   Yes [provider]  lisdexamfetamine (VYVANSE) 50 MG capsule Take 50 mg by mouth daily.   Yes [provider]     Critical care time: 42 min    Baltazar Apo, MD, PhD 02/14/2018, 12:45 PM [facility] [phone] or if no answer [phone]

## 2018-02-14 NOTE — Progress Notes (Signed)
[facility] ROUNDING NOTE   Subjective:   Stable sedated ventilator family at bedside  Blood pressure 107/66 pulse 77 temperature 97.9 O2 sats 94% 50% FiO2 ventilator   IV Levaquin  IV amiodarone  Sodium 139 potassium 3.9 chloride 102 CO2 25 BUN 23 creatinine 2.1 glucose 129 calcium 7.2 phosphorus 3.5 WBC 18.6 hemoglobin 8.8 platelets 136   Objective:  Vital signs in last 24 hours:  Temp:  [93.6 F (34.2 C)-97.9 F (36.6 C)] 97.5 F (36.4 C) (12/13 0900) Pulse Rate:  [55-89] 79 (12/13 0900) Resp:  [0-39] 36 (12/13 0900) BP: (81-128)/(54-90) 103/66 (12/13 0900) SpO2:  [91 %-100 %] 94 % (12/13 0900) Arterial Line BP: (90-138)/(51-76) 125/58 (12/13 0900) FiO2 (%):  [50 %-60 %] 50 % (12/13 0900) Weight:  [82.3 kg] 82.3 kg (12/13 0100)  Weight change: -3.4 kg Filed Weights   02/12/18 0500 02/13/18 0500 02/14/18 0100  Weight: 86.4 kg 85.7 kg 82.3 kg    Intake/Output: I/O last 3 completed shifts: In: 7926.9 [I.V.:6951.9; Other:10; NG/GT:815; IV Piggyback:150] Out: 2876 [Urine:10; Emesis/NG output:200; OTLXB:2620]   Intake/Output this shift:  Total I/O In: 398.8 [I.V.:224.9; NG/GT:150; IV Piggyback:23.9] Out: 728 [Other:728] ET tube placed ventilator CVS-tachycardic no rubs gallops RS-coarse respiratory breath sounds ABD-hypoactive bowel sounds EXT- no edema   Basic Metabolic Panel: Recent Labs  Lab 02/09/18 1725  02/11/18 0152  02/12/18 0358 02/12/18 0922 02/12/18 1643 02/13/18 0409 02/13/18 1555 02/14/18 0320  NA 121*   < > 135   < > 134* 136 135 138 139 139  K 3.0*   < > 4.4   < > 4.0 3.6 3.5 3.6 4.0 3.9  CL 92*   < > 106   < > 102 101 99 99 100 102  CO2 19*   < > 19*   < > 17* 18* _0 GLUCOSE 105*   < > 86   < > 121* 119* 120* 119* 85 129*  BUN 23*   < > 23*   < > 39* 42* 39* 28* 23* 23*  CREATININE 1.20   < > 1.80*   < > 3.74* 4.00* 3.69* 2.93* 2.45* 2.10*  CALCIUM 7.4*   < > 7.0*   < > 6.7* 6.5* 6.4* 6.6* 6.9* 7.2*  MG 1.9   --  2.5*  --  2.6*  --   --  2.3  --  2.5*  PHOS  --   --   --   --   --   --  6.1* 4.4 3.4 3.5   < > = values in this interval not displayed.    Liver Function Tests: Recent Labs  Lab 02/09/18 1725 02/10/18 0540 02/11/18 0152 02/12/18 1643 02/13/18 0409 02/13/18 1555 02/14/18 0320  AST 136* 115* 110*  --  73*  --   --   ALT 57* 46* 35  --  29  --   --   ALKPHOS 90 79 76  --  97  --   --   BILITOT 1.2 1.0 1.0  --  0.9  --   --   PROT 5.6* 5.1* 4.7*  --  4.7*  --   --   ALBUMIN 1.7* 1.5* 1.2* <1.0* 1.0*  1.0* 1.1* 1.1*   Recent Labs  Lab 02/09/18 1725  LIPASE 21   No results for input(s): AMMONIA in the last 168 hours.  CBC: Recent Labs  Lab 02/09/18 1725  02/10/18 0540  02/10/18 2118 02/11/18 0415 02/12/18 3559  02/13/18 0409 02/14/18 0320  WBC 18.8*  --  18.2*  --   --  26.6* 31.1* 24.0* 18.6*  NEUTROABS 17.7*  --   --   --   --   --   --   --   --   HGB 11.6*   < > 10.6*   < > 10.5* 11.0* 10.4* 10.2* 8.8*  HCT 33.7*   < > 31.2*   < > 33.3* 34.6* 31.9* 29.4* 26.0*  MCV 88.5  --  89.9  --   --  96.1 92.7 89.4 89.0  PLT 217  --  149*  --   --  272 198 161 136*   < > = values in this interval not displayed.    Cardiac Enzymes: Recent Labs  Lab 02/09/18 2124  TROPONINI 0.05*    BNP: Invalid input(s): POCBNP  CBG: Recent Labs  Lab 02/13/18 1526 02/13/18 1932 02/13/18 2342 02/14/18 0320 02/14/18 0803  GLUCAP 95 120* 113* 38* 115*    Microbiology: Results for orders placed or performed during the hospital encounter of 02/09/18  Culture, blood (routine x 2)     Status: None   Collection Time: 02/09/18  5:30 PM  Result Value Ref Range Status   Specimen Description BLOOD LEFT ANTECUBITAL  Final   Special Requests   Final    BOTTLES DRAWN AEROBIC AND ANAEROBIC Blood Culture adequate volume   Culture   Final    NO GROWTH 5 DAYS Performed at [facility], [place] [address] [place], [place] [address]    Report Status 02/14/2018 FINAL  Final   Urine culture     Status: None   Collection Time: 02/09/18  6:16 PM  Result Value Ref Range Status   Specimen Description URINE, RANDOM  Final   Special Requests NONE  Final   Culture   Final    NO GROWTH Performed at [facility], [place] [address], [place], [place] [address]    Report Status 02/10/2018 FINAL  Final  Culture, blood (routine x 2)     Status: None   Collection Time: 02/09/18  6:16 PM  Result Value Ref Range Status   Specimen Description BLOOD BLOOD RIGHT WRIST  Final   Special Requests   Final    BOTTLES DRAWN AEROBIC AND ANAEROBIC Blood Culture adequate volume   Culture   Final    NO GROWTH 5 DAYS Performed at [facility], [place] [address] [place], [place] [address]    Report Status 02/14/2018 FINAL  Final  Gastrointestinal Panel by PCR , Stool     Status: None   Collection Time: 02/09/18  6:36 PM  Result Value Ref Range Status   Campylobacter species NOT DETECTED NOT DETECTED Final   Plesimonas shigelloides NOT DETECTED NOT DETECTED Final   Salmonella species NOT DETECTED NOT DETECTED Final   Yersinia enterocolitica NOT DETECTED NOT DETECTED Final   Vibrio species NOT DETECTED NOT DETECTED Final   Vibrio cholerae NOT DETECTED NOT DETECTED Final   Enteroaggregative E coli (EAEC) NOT DETECTED NOT DETECTED Final   Enteropathogenic E coli (EPEC) NOT DETECTED NOT DETECTED Final   Enterotoxigenic E coli (ETEC) NOT DETECTED NOT DETECTED Final   Shiga like toxin producing E coli (STEC) NOT DETECTED NOT DETECTED Final   Shigella/Enteroinvasive E coli (EIEC) NOT DETECTED NOT DETECTED Final   Cryptosporidium NOT DETECTED NOT DETECTED Final   Cyclospora cayetanensis NOT DETECTED NOT DETECTED Final   Entamoeba histolytica NOT DETECTED  NOT DETECTED Final   Giardia lamblia NOT DETECTED NOT DETECTED Final   Adenovirus F40/41 NOT DETECTED NOT DETECTED Final   Astrovirus NOT DETECTED NOT DETECTED Final   Norovirus GI/GII NOT DETECTED NOT DETECTED Final   Rotavirus  A NOT DETECTED NOT DETECTED Final   Sapovirus (I, II, IV, and V) NOT DETECTED NOT DETECTED Final    Comment: Performed at [facility], [place] [place], [place] [address]  C difficile quick scan w PCR reflex     Status: None   Collection Time: 02/09/18  6:36 PM  Result Value Ref Range Status   C Diff antigen NEGATIVE NEGATIVE Final   C Diff toxin NEGATIVE NEGATIVE Final   C Diff interpretation No C. difficile detected.  Final    Comment: Performed at [facility], [place] [address], [place], [place] [address]  MRSA PCR Screening     Status: None   Collection Time: 02/10/18  1:34 AM  Result Value Ref Range Status   MRSA by PCR NEGATIVE NEGATIVE Final    Comment:        The GeneXpert MRSA Assay (FDA approved for NASAL specimens only), is one component of a comprehensive MRSA colonization surveillance program. It is not intended to diagnose MRSA infection nor to guide or monitor treatment for MRSA infections. Performed at [facility], [place] [address], [place], [place] [address]   Culture, bal-quantitative     Status: None   Collection Time: 02/10/18  1:26 PM  Result Value Ref Range Status   Specimen Description BRONCHIAL ALVEOLAR LAVAGE  Final   Special Requests Normal  Final   Gram Stain   Final    FEW WBC PRESENT,BOTH PMN AND MONONUCLEAR NO ORGANISMS SEEN    Culture   Final    NO GROWTH 2 DAYS Performed at [facility], [place] [address], [place], [place] [address]    Report Status 02/12/2018 FINAL  Final  Pneumocystis smear by DFA     Status: None   Collection Time: 02/10/18  1:26 PM  Result Value Ref Range Status   Specimen Source-PJSRC BRONCHIAL ALVEOLAR LAVAGE  Final   Pneumocystis jiroveci Ag NEGATIVE  Final    Comment: Performed at [facility] Performed at [facility], [address] [place], [place] [address]     Coagulation Studies: Recent Labs    02/13/18 0409  LABPROT 13.8  INR 1.07    Urinalysis: No  results for input(s): COLORURINE, LABSPEC, PHURINE, GLUCOSEU, HGBUR, BILIRUBINUR, KETONESUR, PROTEINUR, UROBILINOGEN, NITRITE, LEUKOCYTESUR in the last 72 hours.  Invalid input(s): APPERANCEUR    Imaging: Dg Chest Port 1 View  Result Date: 02/14/2018 CLINICAL DATA:  Acute respiratory failure with hypoxia EXAM: PORTABLE CHEST 1 VIEW COMPARISON:  02/13/2018 FINDINGS: Endotracheal tube in good position. New right jugular central venous catheter tip in the SVC. Left jugular central venous catheter tip in the lower SVC. No pneumothorax. NG tube enters the stomach Extensive bilateral airspace disease right greater than left appears stable. IMPRESSION: Extensive bilateral airspace disease right greater than left is unchanged. Electronically Signed   By: Franchot Gallo M.D.   On: 02/14/2018 07:43   Dg Chest Port 1 View  Result Date: 02/13/2018 CLINICAL DATA:  Respiratory failure and hypoxia EXAM: PORTABLE CHEST 1 VIEW COMPARISON:  02/12/2018 FINDINGS: Cardiac shadow is stable. Endotracheal tube, nasogastric catheter and bilateral jugular catheters are again noted and stable. The lungs are well aerated bilaterally with diffuse infiltrate throughout  the right lung as well as within the left lung base. No pneumothorax is seen. No bony abnormality is noted. IMPRESSION: Diffuse infiltrates right greater than left similar to that seen on the prior exam. Tubes and lines stable in appearance. Electronically Signed   By: Inez Catalina M.D.   On: 02/13/2018 07:52   Dg Chest Port 1 View  Result Date: 02/12/2018 CLINICAL DATA:  Status post central line placement. EXAM: PORTABLE CHEST 1 VIEW COMPARISON:  02/12/2018 at 0513 hours FINDINGS: Endotracheal tube and left jugular catheter are unchanged. Enteric tube courses into the left upper abdomen with tip not imaged. A new right jugular catheter terminates over the mid to upper SVC. Extensive airspace consolidation throughout the majority of the right lung and throughout  the left lower lobe is unchanged. There may be a small right pleural effusion. No pneumothorax is identified. IMPRESSION: 1. New right jugular catheter terminates over the mid to upper SVC. No pneumothorax. 2. Unchanged extensive bilateral airspace disease. Electronically Signed   By: Logan Bores M.D.   On: 02/12/2018 12:26     Medications:   .  prismasol BGK 4/2.5 500 mL/hr at 02/14/18 0029  .  prismasol BGK 4/2.5 500 mL/hr at 02/14/18 0055  . sodium chloride Stopped (02/14/18 0930)  . amiodarone 30 mg/hr (02/14/18 1000)  . fentaNYL infusion INTRAVENOUS 400 mcg/hr (02/14/18 1000)  . heparin 10,000 units/ 20 mL infusion syringe 2,200 Units/hr (02/14/18 0955)  . levofloxacin (LEVAQUIN) IV 50 mL/hr at 02/14/18 1000  . midazolam (VERSED) infusion 10 mg/hr (02/14/18 1000)  . norepinephrine (LEVOPHED) Adult infusion Stopped (02/13/18 1109)  . phenylephrine (NEO-SYNEPHRINE) Adult infusion Stopped (02/13/18 2248)  . prismasol BGK 4/2.5 2,000 mL/hr at 02/14/18 0844  . sodium chloride     . chlorhexidine gluconate (MEDLINE KIT)  15 mL Mouth Rinse BID  . Chlorhexidine Gluconate Cloth  6 each Topical Q0600  . feeding supplement (PRO-STAT SUGAR FREE 64)  30 mL Per Tube QID  . feeding supplement (VITAL HIGH PROTEIN)  1,000 mL Per Tube Q24H  . hydrocortisone sod succinate (SOLU-CORTEF) inj  50 mg Intravenous Q6H  . Influenza vac split quadrivalent PF  0.5 mL Intramuscular Tomorrow-1000  . mouth rinse  15 mL Mouth Rinse 10 times per day  . pantoprazole (PROTONIX) IV  40 mg Intravenous Q12H  . sodium chloride flush  3 mL Intravenous Q12H   sodium chloride, acetaminophen, acetaminophen **OR** [DISCONTINUED] acetaminophen, fentaNYL, heparin, heparin, levalbuterol, midazolam, [DISCONTINUED] ondansetron **OR** ondansetron (ZOFRAN) IV, sodium chloride  Assessment/ Plan:   Acute kidney injury in setting of hypotension sepsis nonsteroidal anti-inflammatory drug use and CT scan of abdomen pelvis performed  with IV contrast on 02/07/2018.  CRRT initiated appears to be stable on this therapy will continue.  There appears to be no improvement in urine output at this time and will continue CRRT for now  Hypotension appears to have resolved with improved pressures EF 40 to 45% secondary to septic shock.  Sepsis now changed to Levaquin IV  Metabolic acidosis resolved  Atrial fibrillation secondary to sepsis continues on IV amiodarone  GI prophylaxis with Protonix 40 mg every 12 hours.   LOS: [place] _0 _1 :11 AM

## 2018-02-15 ENCOUNTER — Inpatient Hospital Stay (HOSPITAL_COMMUNITY): Payer: BLUE CROSS/BLUE SHIELD

## 2018-02-15 LAB — GLUCOSE, CAPILLARY
Glucose-Capillary: 111 mg/dL — ABNORMAL HIGH (ref 70–99)
Glucose-Capillary: 106 mg/dL — ABNORMAL HIGH (ref 70–99)
Glucose-Capillary: 117 mg/dL — ABNORMAL HIGH (ref 70–99)
Glucose-Capillary: 89 mg/dL (ref 70–99)
Glucose-Capillary: 113 mg/dL — ABNORMAL HIGH (ref 70–99)

## 2018-02-15 LAB — RENAL FUNCTION PANEL
Albumin: 1.3 g/dL — ABNORMAL LOW (ref 3.5–5.0)
Albumin: 1.4 g/dL — ABNORMAL LOW (ref 3.5–5.0)
Anion gap: 10 (ref 5–15)
Anion gap: 10 (ref 5–15)
BUN: 33 mg/dL — ABNORMAL HIGH (ref 6–20)
BUN: 32 mg/dL — ABNORMAL HIGH (ref 6–20)
CO2: 27 mmol/L (ref 22–32)
CO2: 27 mmol/L (ref 22–32)
Calcium: 7.4 mg/dL — ABNORMAL LOW (ref 8.9–10.3)
Calcium: 7.8 mg/dL — ABNORMAL LOW (ref 8.9–10.3)
Chloride: 101 mmol/L (ref 98–111)
Chloride: 101 mmol/L (ref 98–111)
Creatinine, Ser: 1.81 mg/dL — ABNORMAL HIGH (ref 0.61–1.24)
Creatinine, Ser: 1.59 mg/dL — ABNORMAL HIGH (ref 0.61–1.24)
GFR calc Af Amer: 50 mL/min — ABNORMAL LOW (ref >60)
GFR calc Af Amer: 58 mL/min — ABNORMAL LOW (ref >60)
GFR calc non Af Amer: 43 mL/min — ABNORMAL LOW (ref >60)
GFR calc non Af Amer: 50 mL/min — ABNORMAL LOW (ref >60)
Glucose, Bld: 129 mg/dL — ABNORMAL HIGH (ref 70–99)
Glucose, Bld: 116 mg/dL — ABNORMAL HIGH (ref 70–99)
Phosphorus: 2.4 mg/dL — ABNORMAL LOW (ref 2.5–4.6)
Phosphorus: 2.4 mg/dL — ABNORMAL LOW (ref 2.5–4.6)
Potassium: 3.6 mmol/L (ref 3.5–5.1)
Potassium: 3.8 mmol/L (ref 3.5–5.1)
Sodium: 138 mmol/L (ref 135–145)
Sodium: 138 mmol/L (ref 135–145)

## 2018-02-15 LAB — CBC
HCT: 24.3 % — ABNORMAL LOW (ref 39.0–52.0)
Hemoglobin: 7.9 g/dL — ABNORMAL LOW (ref 13.0–17.0)
MCH: 29.6 pg (ref 26.0–34.0)
MCHC: 32.5 g/dL (ref 30.0–36.0)
MCV: 91 fL (ref 80.0–100.0)
Platelets: UNDETERMINED 10*3/uL (ref 150–400)
RBC: 2.67 MIL/uL — ABNORMAL LOW (ref 4.22–5.81)
RDW: 16.3 % — ABNORMAL HIGH (ref 11.5–15.5)
WBC: 18.5 10*3/uL — ABNORMAL HIGH (ref 4.0–10.5)
nRBC: 0.2 % (ref 0.0–0.2)

## 2018-02-15 LAB — POCT I-STAT 3, ART BLOOD GAS (G3+)
Acid-Base Excess: 2 mmol/L (ref 0.0–2.0)
Bicarbonate: 27.3 mmol/L (ref 20.0–28.0)
O2 Saturation: 97 %
Patient temperature: 97.3
TCO2: 29 mmol/L (ref 22–32)
pCO2 arterial: 43.9 mmHg (ref 32.0–48.0)
pH, Arterial: 7.398 (ref 7.350–7.450)
pO2, Arterial: 87 mmHg (ref 83.0–108.0)

## 2018-02-15 LAB — POCT ACTIVATED CLOTTING TIME: Activated Clotting Time: 191 seconds

## 2018-02-15 LAB — APTT: aPTT: 187 seconds (ref 24–36)

## 2018-02-15 LAB — MAGNESIUM: Magnesium: 2.7 mg/dL — ABNORMAL HIGH (ref 1.7–2.4)

## 2018-02-15 IMAGING — DX DG CHEST 1V PORT
1 series · 1 of 1 positions shown · non-contrast
Comparison: [DATE]

CLINICAL DATA: Acute respiratory failure.

EXAM:
PORTABLE CHEST 1 VIEW

[chest]
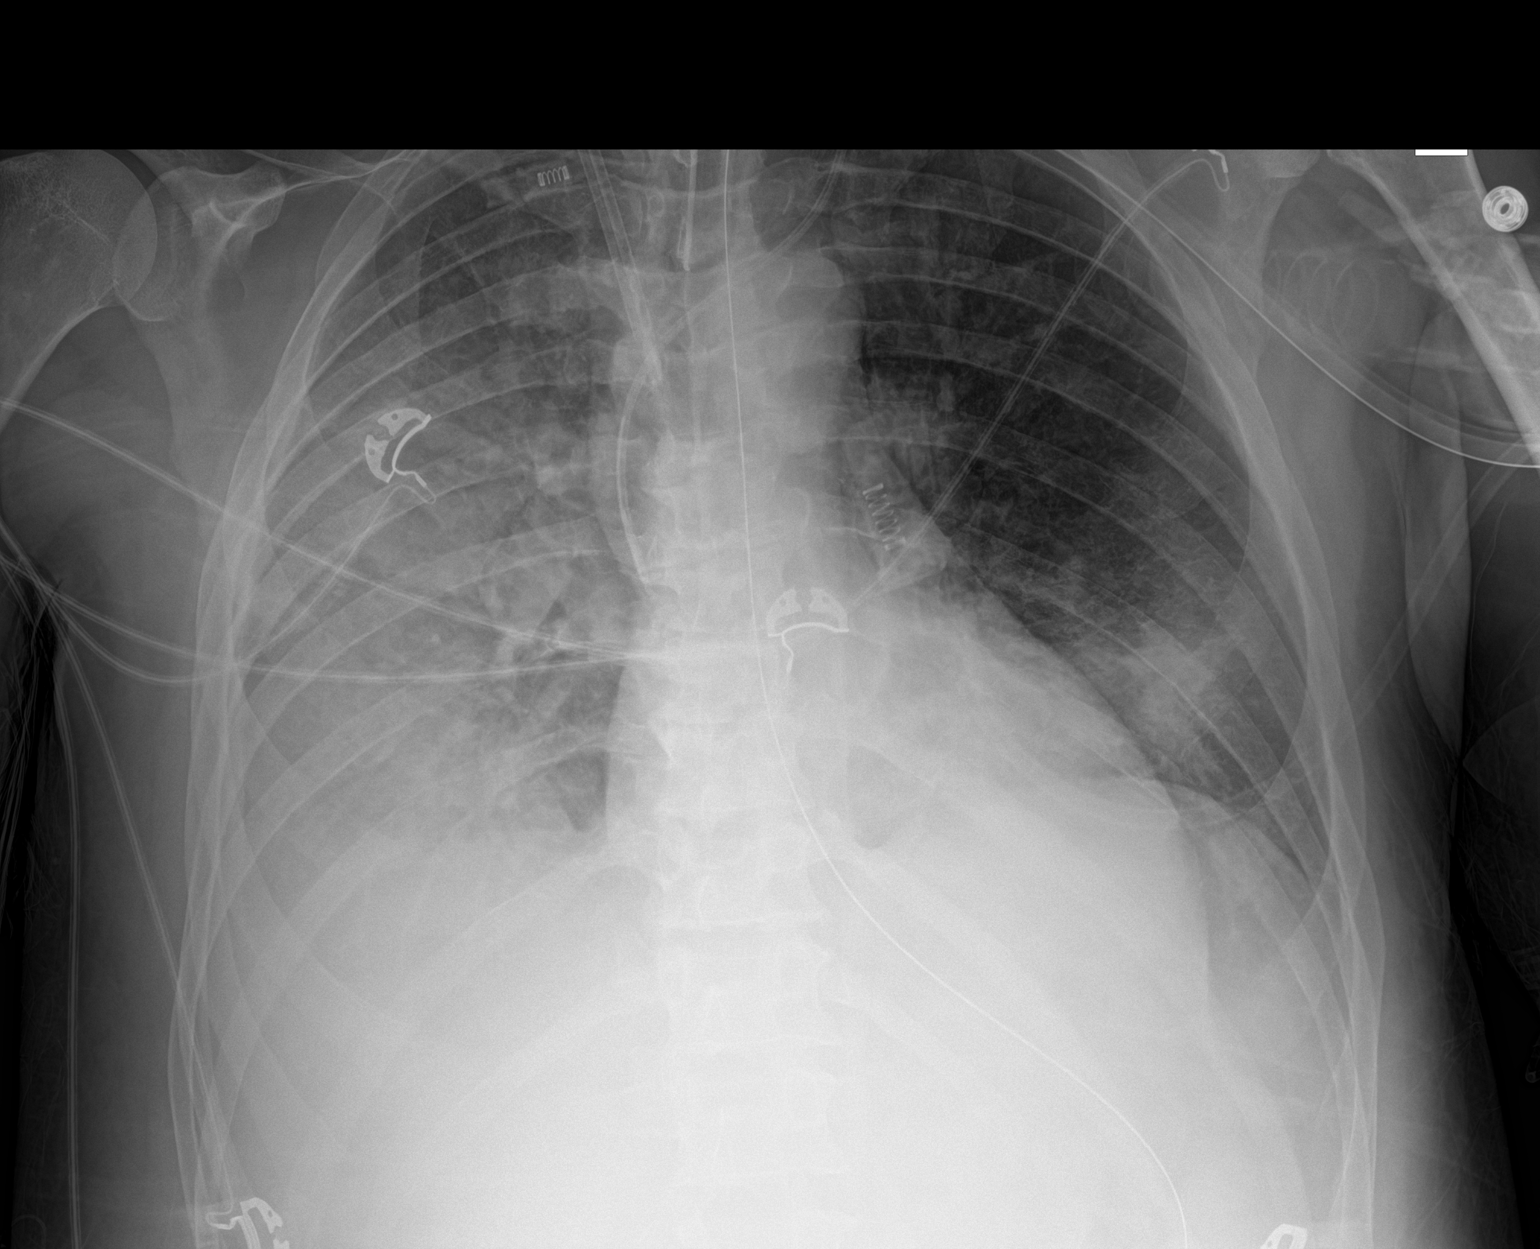

[1 of 1 positions shown; findings below may reference images not displayed]

FINDINGS: Enteric tube tip is above the carina. There is a right IJ catheter
with tip in the projection of the SVC. Left IJ catheter tip is at
the level of the cavoatrial junction. Normal heart size. Bilateral
airspace disease, right greater than left. Not significantly changed
from previous exam.
IMPRESSION: 1. No significant change in aeration to the lungs compared with
previous study.
2. Stable support apparatus.

## 2018-02-15 NOTE — Progress Notes (Signed)
[facility] ROUNDING NOTE   Subjective:   Stable sedated ventilator family at bedside  Blood pressure 124/77 pulse 62 temperature 96.8 O2 sats 100% 50% FiO2   IV Levaquin  IV amiodarone  Sodium 138 potassium 3.6 chloride 101 CO2 27 BUN 33 creatinine 1.81 calcium 7.4 glucose 129 phosphorus 2.4 magnesium 2.7 Albumin 1.3 white blood count 18.5 hemoglobin 7.9 platelets pending   Objective:  Vital signs in last 24 hours:  Temp:  [96.1 F (35.6 C)-97.9 F (36.6 C)] 96.8 F (36 C) (12/14 1300) Pulse Rate:  [62-84] 62 (12/14 1300) Resp:  [15-38] 35 (12/14 1300) BP: (103-150)/(65-98) 124/77 (12/14 1300) SpO2:  [86 %-100 %] 100 % (12/14 1300) Arterial Line BP: (125-171)/(59-76) 171/76 (12/14 1300) FiO2 (%):  [40 %-50 %] 50 % (12/14 1116) Weight:  [78.5 kg] 78.5 kg (12/14 0251)  Weight change: -3.8 kg Filed Weights   02/13/18 0500 02/14/18 0100 02/15/18 0251  Weight: 85.7 kg 82.3 kg 78.5 kg    Intake/Output: I/O last 3 completed shifts: In: 4529.8 [I.V.:2299.9; Other:40; NG/GT:2140; IV Piggyback:50] Out: 8232 [Urine:10; NUUVO:5366; Stool:125]   Intake/Output this shift:  Total I/O In: 640.2 [I.V.:290.2; NG/GT:300; IV Piggyback:50] Out: 967 [Other:967] ET tube placed ventilator CVS-tachycardic no rubs gallops RS-coarse respiratory breath sounds ABD-hypoactive bowel sounds EXT- no edema   Basic Metabolic Panel: Recent Labs  Lab 02/11/18 0152  02/12/18 0358  02/13/18 0409 02/13/18 1555 02/14/18 0320 02/14/18 1630 02/15/18 0357  NA 135   < > 134*   < > 138 139 139 140 138  K 4.4   < > 4.0   < > 3.6 4.0 3.9 3.7 3.6  CL 106   < > 102   < > 99 100 102 102 101  CO2 19*   < > 17*   < > _0 GLUCOSE 86   < > 121*   < > 119* 85 129* 130* 129*  BUN 23*   < > 39*   < > 28* 23* 23* 30* 33*  CREATININE 1.80*   < > 3.74*   < > 2.93* 2.45* 2.10* 2.08* 1.81*  CALCIUM 7.0*   < > 6.7*   < > 6.6* 6.9* 7.2* 7.4* 7.4*  MG 2.5*  --  2.6*  --  2.3  --   2.5*  --  2.7*  PHOS  --   --   --    < > 4.4 3.4 3.5 2.6 2.4*   < > = values in this interval not displayed.    Liver Function Tests: Recent Labs  Lab 02/09/18 1725 02/10/18 0540 02/11/18 0152  02/13/18 0409 02/13/18 1555 02/14/18 0320 02/14/18 1630 02/15/18 0357  AST 136* 115* 110*  --  73*  --   --   --   --   ALT 57* 46* 35  --  29  --   --   --   --   ALKPHOS 90 79 76  --  97  --   --   --   --   BILITOT 1.2 1.0 1.0  --  0.9  --   --   --   --   PROT 5.6* 5.1* 4.7*  --  4.7*  --   --   --   --   ALBUMIN 1.7* 1.5* 1.2*   < > 1.0*  1.0* 1.1* 1.1* 1.2* 1.3*   < > = values in this interval not displayed.   Recent Labs  Lab 02/09/18 1725  LIPASE 21   No results for input(s): AMMONIA in the last 168 hours.  CBC: Recent Labs  Lab 02/09/18 1725  02/11/18 0415 02/12/18 0358 02/13/18 0409 02/14/18 0320 02/15/18 0357  WBC 18.8*   < > 26.6* 31.1* 24.0* 18.6* 18.5*  NEUTROABS 17.7*  --   --   --   --   --   --   HGB 11.6*   < > 11.0* 10.4* 10.2* 8.8* 7.9*  HCT 33.7*   < > 34.6* 31.9* 29.4* 26.0* 24.3*  MCV 88.5   < > 96.1 92.7 89.4 89.0 91.0  PLT 217   < > 272 198 161 136* PLATELET CLUMPS NOTED ON SMEAR, UNABLE TO ESTIMATE   < > = values in this interval not displayed.    Cardiac Enzymes: Recent Labs  Lab 02/09/18 2124  TROPONINI 0.05*    BNP: Invalid input(s): POCBNP  CBG: Recent Labs  Lab 02/14/18 1939 02/14/18 2350 02/15/18 0353 02/15/18 0749 02/15/18 1145  GLUCAP 110* 125* 111* 106* 117*    Microbiology: Results for orders placed or performed during the hospital encounter of 02/09/18  Culture, blood (routine x 2)     Status: None   Collection Time: 02/09/18  5:30 PM  Result Value Ref Range Status   Specimen Description BLOOD LEFT ANTECUBITAL  Final   Special Requests   Final    BOTTLES DRAWN AEROBIC AND ANAEROBIC Blood Culture adequate volume   Culture   Final    NO GROWTH 5 DAYS Performed at [facility], [place] [address]  [place], [place] [address]    Report Status 02/14/2018 FINAL  Final  Urine culture     Status: None   Collection Time: 02/09/18  6:16 PM  Result Value Ref Range Status   Specimen Description URINE, RANDOM  Final   Special Requests NONE  Final   Culture   Final    NO GROWTH Performed at [facility], [place] [address], [place], [place] [address]    Report Status 02/10/2018 FINAL  Final  Culture, blood (routine x 2)     Status: None   Collection Time: 02/09/18  6:16 PM  Result Value Ref Range Status   Specimen Description BLOOD BLOOD RIGHT WRIST  Final   Special Requests   Final    BOTTLES DRAWN AEROBIC AND ANAEROBIC Blood Culture adequate volume   Culture   Final    NO GROWTH 5 DAYS Performed at [facility], [place] [address], [place], [place] [address]    Report Status 02/14/2018 FINAL  Final  Gastrointestinal Panel by PCR , Stool     Status: None   Collection Time: 02/09/18  6:36 PM  Result Value Ref Range Status   Campylobacter species NOT DETECTED NOT DETECTED Final   Plesimonas shigelloides NOT DETECTED NOT DETECTED Final   Salmonella species NOT DETECTED NOT DETECTED Final   Yersinia enterocolitica NOT DETECTED NOT DETECTED Final   Vibrio species NOT DETECTED NOT DETECTED Final   Vibrio cholerae NOT DETECTED NOT DETECTED Final   Enteroaggregative E coli (EAEC) NOT DETECTED NOT DETECTED Final   Enteropathogenic E coli (EPEC) NOT DETECTED NOT DETECTED Final   Enterotoxigenic E coli (ETEC) NOT DETECTED NOT DETECTED Final   Shiga like toxin producing E coli (STEC) NOT DETECTED NOT DETECTED Final   Shigella/Enteroinvasive E coli (EIEC) NOT DETECTED NOT DETECTED Final   Cryptosporidium NOT DETECTED NOT DETECTED Final   Cyclospora cayetanensis NOT DETECTED  NOT DETECTED Final   Entamoeba histolytica NOT DETECTED NOT DETECTED Final   Giardia lamblia NOT DETECTED NOT DETECTED Final   Adenovirus F40/41 NOT DETECTED NOT DETECTED Final   Astrovirus NOT DETECTED NOT DETECTED Final    Norovirus GI/GII NOT DETECTED NOT DETECTED Final   Rotavirus A NOT DETECTED NOT DETECTED Final   Sapovirus (I, II, IV, and V) NOT DETECTED NOT DETECTED Final    Comment: Performed at [facility], [place] [place], [place] [address]  C difficile quick scan w PCR reflex     Status: None   Collection Time: 02/09/18  6:36 PM  Result Value Ref Range Status   C Diff antigen NEGATIVE NEGATIVE Final   C Diff toxin NEGATIVE NEGATIVE Final   C Diff interpretation No C. difficile detected.  Final    Comment: Performed at [facility], [place] [address] [place], [place] [address]  MRSA PCR Screening     Status: None   Collection Time: 02/10/18  1:34 AM  Result Value Ref Range Status   MRSA by PCR NEGATIVE NEGATIVE Final    Comment:        The GeneXpert MRSA Assay (FDA approved for NASAL specimens only), is one component of a comprehensive MRSA colonization surveillance program. It is not intended to diagnose MRSA infection nor to guide or monitor treatment for MRSA infections. Performed at [facility], [place] [address] [place], [place] [address]   Culture, bal-quantitative     Status: None   Collection Time: 02/10/18  1:26 PM  Result Value Ref Range Status   Specimen Description BRONCHIAL ALVEOLAR LAVAGE  Final   Special Requests Normal  Final   Gram Stain   Final    FEW WBC PRESENT,BOTH PMN AND MONONUCLEAR NO ORGANISMS SEEN    Culture   Final    NO GROWTH 2 DAYS Performed at [facility], [place] [address], [place], [place] [address]    Report Status 02/12/2018 FINAL  Final  Pneumocystis smear by DFA     Status: None   Collection Time: 02/10/18  1:26 PM  Result Value Ref Range Status   Specimen Source-PJSRC BRONCHIAL ALVEOLAR LAVAGE  Final   Pneumocystis jiroveci Ag NEGATIVE  Final    Comment: Performed at [facility] Performed at [facility], [address], [place], [place] [address]     Coagulation Studies: Recent Labs     02/13/18 0409  LABPROT 13.8  INR 1.07    Urinalysis: No results for input(s): COLORURINE, LABSPEC, PHURINE, GLUCOSEU, HGBUR, BILIRUBINUR, KETONESUR, PROTEINUR, UROBILINOGEN, NITRITE, LEUKOCYTESUR in the last 72 hours.  Invalid input(s): APPERANCEUR    Imaging: Dg Chest Port 1 View  Result Date: 02/15/2018 CLINICAL DATA:  Acute respiratory failure. EXAM: PORTABLE CHEST 1 VIEW COMPARISON:  02/14/2018 FINDINGS: Enteric tube tip is above the carina. There is a right IJ catheter with tip in the projection of the SVC. Left IJ catheter tip is at the level of the cavoatrial junction. Normal heart size. Bilateral airspace disease, right greater than left. Not significantly changed from previous exam. IMPRESSION: 1. No significant change in aeration to the lungs compared with previous study. 2. Stable support apparatus. Electronically Signed   By: Kerby Moors M.D.   On: 02/15/2018 08:24   Dg Chest Port 1 View  Result Date: 02/14/2018 CLINICAL DATA:  Acute respiratory failure with hypoxia EXAM: PORTABLE CHEST 1 VIEW COMPARISON:  02/13/2018 FINDINGS: Endotracheal tube in good position.  New right jugular central venous catheter tip in the SVC. Left jugular central venous catheter tip in the lower SVC. No pneumothorax. NG tube enters the stomach Extensive bilateral airspace disease right greater than left appears stable. IMPRESSION: Extensive bilateral airspace disease right greater than left is unchanged. Electronically Signed   By: Franchot Gallo M.D.   On: 02/14/2018 07:43     Medications:   .  prismasol BGK 4/2.5 500 mL/hr at 02/15/18 0850  .  prismasol BGK 4/2.5 500 mL/hr at 02/15/18 0850  . sodium chloride 10 mL/hr at 02/15/18 1300  . fentaNYL infusion INTRAVENOUS 350 mcg/hr (02/15/18 1300)  . heparin 10,000 units/ 20 mL infusion syringe Stopped (02/15/18 0659)  . levofloxacin (LEVAQUIN) IV Stopped (02/15/18 1132)  . midazolam (VERSED) infusion 6 mg/hr (02/15/18 1300)  . norepinephrine  (LEVOPHED) Adult infusion Stopped (02/13/18 1109)  . phenylephrine (NEO-SYNEPHRINE) Adult infusion Stopped (02/13/18 2248)  . prismasol BGK 4/2.5 2,000 mL/hr at 02/15/18 1034  . sodium chloride     . chlorhexidine gluconate (MEDLINE KIT)  15 mL Mouth Rinse BID  . Chlorhexidine Gluconate Cloth  6 each Topical Q0600  . feeding supplement (PRO-STAT SUGAR FREE 64)  30 mL Per Tube QID  . feeding supplement (VITAL HIGH PROTEIN)  1,000 mL Per Tube Q24H  . hydrocortisone sod succinate (SOLU-CORTEF) inj  50 mg Intravenous Q6H  . Influenza vac split quadrivalent PF  0.5 mL Intramuscular Tomorrow-1000  . mouth rinse  15 mL Mouth Rinse 10 times per day  . pantoprazole (PROTONIX) IV  40 mg Intravenous Q12H  . sodium chloride flush  3 mL Intravenous Q12H   sodium chloride, acetaminophen, acetaminophen **OR** [DISCONTINUED] acetaminophen, fentaNYL, heparin, heparin, levalbuterol, midazolam, [DISCONTINUED] ondansetron **OR** ondansetron (ZOFRAN) IV, sodium chloride  Assessment/ Plan:   Acute kidney injury in setting of hypotension sepsis nonsteroidal anti-inflammatory drug use and CT scan of abdomen pelvis performed with IV contrast on 02/07/2018.  CRRT initiated appears to be stable on this therapy will continue.  There appears to be no improvement in urine output at this time and will continue CRRT for now.  He appears to be improving clinically we will continue to follow  Hypotension appears to have resolved with improved pressures EF 40 to 45% secondary to septic shock.  Sepsis IV Levaquin  Metabolic acidosis resolved  Atrial fibrillation secondary to sepsis continues on IV amiodarone  GI prophylaxis with Protonix 40 mg every 12 hours.   LOS: [place] _0 _1 :20 PM

## 2018-02-15 NOTE — Progress Notes (Signed)
eLink Physician-Brief Progress Note Patient Name: Nolon BussingGary W Helmkamp DOB: 1970-11-18 MRN: [id number]   Date of Service  02/15/2018  HPI/Events of Note  Tube feeding started recently now with significant loose stools. Request for Flexiseal.  eICU Interventions  Fecal management tube ordered     Intervention Category Minor Interventions: Other:  Darl Pikesmily T Tiras Bianchini 02/15/2018, 2:34 AM

## 2018-02-15 NOTE — Progress Notes (Signed)
CRITICAL VALUE ALERT  Critical Value:PTT 187  Date & Time Notied:  02/15/18 0630  Provider Notified: E-Link  Orders Received/Actions taken: No new orders, will continue to monitor

## 2018-02-15 NOTE — Progress Notes (Signed)
NAME:  Roberto Knapp, MRN:  [id number], DOB:  12-03-1970, LOS: 6 ADMISSION DATE:  02/09/2018, CONSULTATION DATE:  12/9 REFERRING MD:  Candiss Norse, CHIEF COMPLAINT:  Acute hypoxic respiratory failure    Brief History   79 yom admitted 12/8 w/ CAP, probable gastritis (from NSAIDS) and progressive hypoxic resp failure   History of present illness   47 year old male presented to ED 12/8 w/ cc: fever, body ache, abd pain, N/V, melena, BRBPR, cough and SOB. Initial onset about 10d prior. Initial symptoms were the fever, N/V/D,  body ache, cough shortness of breath for which he was taking advil about every 4 hours, the melena and BRBPR started a few days later. He reported the abd pain as epigastric in nature.  In ER: he was febrile, tachycardic,his RR was in 30s, wbc 18.9, CXR showed marked R>L airspace disease. CT abd/pelvis was neg. FOB pos, influenza PCR neg, UDS was positive for THC. He was admitted to IM service. Started on supplemental oxygen and abx. His respiratory status continued to worsen and by am 12/9 he was titrated up to 100 % on BIPAP w/ marked accessory use and RR in excess of 35 BPM. His HR on tele was in 160s in atrial fib. PCCM asked to see.   Past Medical History  smoker  Significant Hospital Events   12/8 admitted w/ working dx PNA, GIB (prb 2/2 NSAIDs) and acute resp failure 12/9 PCCM called. Resp status progressed. 100% BIPAP. R?L infiltrate on CXR 5/9 paralytics and prone positioning  Consults:  PCCM  Renal  Procedures:  OETT 12/9 >>  Art line 12/9 >>  L IJ HD catheter 12/9 >>   Significant Diagnostic Tests:  CT abdomen/pelvis 12/8 >> extensive bilateral basilar airspace disease, no acute findings in the abdomen or pelvis  Micro Data:  Influenza PCR 12/8: negative Strep antigen 12/8: negative Legionella antigen 12/8>>> positive Respiratory culture 12/9>>> BCX2 12/9>>> Urine 12/8 >> negative BAL 12/9 >> negative Pneumocystis DFA 12/9 >> negative cdiff 12/9:  negative GI Panel 12/9>>> negative  Antimicrobials:  Zosyn 12/9>>> 12/12 Vanc 12/8>>> 12/10 azith 12/9>>> 12/12 Levofloxacin 12/12 >>   Interim history/subjective:  Started having loose stools after tube feeding initiated 12/13 FiO2 50%, PEEP 10 Fentanyl and Versed for sedation Able to tolerate negative balance for the last 2 days  Objective   Blood pressure 123/69, pulse 84, temperature (!) 97.5 F (36.4 C), resp. rate (!) 35, height _0  (1.727 m), weight 78.5 kg, SpO2 (!) 88 %.    Vent Mode: PRVC FiO2 (%):  [40 %-50 %] 50 % Set Rate:  [35 bmp] 35 bmp Vt Set:  [410 mL] 410 mL PEEP:  [10 cmH20] 10 cmH20 Plateau Pressure:  [14 cmH20-27 cmH20] 14 cmH20   Intake/Output Summary (Last 24 hours) at 02/15/2018 1037 Last data filed at 02/15/2018 1000 Gross per 24 hour  Intake 2425.01 ml  Output 4862 ml  Net -2436.99 ml   Filed Weights   02/13/18 0500 02/14/18 0100 02/15/18 0251  Weight: 85.7 kg 82.3 kg 78.5 kg    Examination: General: Ill-appearing man, ventilated, supine in bed HENT: ET tube in good position, oropharynx clear, he does have some thick secretions that are successfully suctioned Lungs: Crackles bilaterally with some inspiratory squeaks Cardiovascular: Regular, no murmur Abdomen: Soft, nondistended with positive bowel sounds Extremities: 1+ edema Neuro: Some coughing with interaction, does grimace with pain and stimulation, not following commands GU: Foley in place, significant scrotal edema  Resolved Hospital Problem  list   Septic shock A Fib + RVR  Assessment & Plan:  Acute Hypoxic respiratory failure in setting of CAP vs aspiration (favor CAP, Legionella antigen positive) w/  ARDS.  No history of vaping in the last year per his wife Plan ARDS protocol.  Continue to wean PEEP and FiO2 as we are able.  He is tolerating some lighter sedation, is coughing more, is setting his own respiratory rate.  I will try to bring his PEEP at 8 today, follow gas  exchange Continue levofloxacin monotherapy.  Total antibiotics day 6 of 10 Continue volume removal with CVVHD.  Appreciate nephrology assistance.  Shock, presumed septic shock, resolved Plan Antibiotics as above Continue stress dose hydrocortisone for 3 more days  afib w/ RVR; currently has perfusing BP, heart rate controlled Plan Amiodarone stopped on 12/13 Anticoagulation deferred given his recent GI blood loss  Acute renal failure, oliguric Mixed anion gap metabolic and respiratory acidosis, improved Plan Continue CVVHD.  Appreciate nephrology assistance. Goal volume removal  GIB: Suspect this 2/2 NSAIDS Plan Continue PPI Tolerating tube feeding, continue  Fluid and electrolyte imbalance: hyponatremia, hypokalemia  Plan Minimizing IV fluids.  Removing volume via CVVHD Follow BMP Replace electrolytes as indicated  Mild LFT elevation, stable to improved 12/12 Plan  Follow LFT intermittently    Best practice:  Diet: Started tube feeds 12/13 Pain/Anxiety/Delirium protocol (if indicated): 12/9 VAP protocol (if indicated): 12/9 DVT prophylaxis: [place] heparin  GI prophylaxis: PPI BID Glucose control: May become necessary with initiation tube feeding Mobility: BR Code Status: full code  Family Communication: Updated the patient's wife at bedside 12/14 Disposition: ICU  Labs   CBC: Recent Labs  Lab 02/09/18 1725  02/11/18 0415 02/12/18 0358 02/13/18 0409 02/14/18 0320 02/15/18 0357  WBC 18.8*   < > 26.6* 31.1* 24.0* 18.6* 18.5*  NEUTROABS 17.7*  --   --   --   --   --   --   HGB 11.6*   < > 11.0* 10.4* 10.2* 8.8* 7.9*  HCT 33.7*   < > 34.6* 31.9* 29.4* 26.0* 24.3*  MCV 88.5   < > 96.1 92.7 89.4 89.0 91.0  PLT 217   < > 272 198 161 136* PLATELET CLUMPS NOTED ON SMEAR, UNABLE TO ESTIMATE   < > = values in this interval not displayed.    Basic Metabolic Panel: Recent Labs  Lab 02/11/18 0152  02/12/18 0358  02/13/18 0409 02/13/18 1555 02/14/18 0320  02/14/18 1630 02/15/18 0357  NA 135   < > 134*   < > 138 139 139 140 138  K 4.4   < > 4.0   < > 3.6 4.0 3.9 3.7 3.6  CL 106   < > 102   < > 99 100 102 102 101  CO2 19*   < > 17*   < > _0 GLUCOSE 86   < > 121*   < > 119* 85 129* 130* 129*  BUN 23*   < > 39*   < > 28* 23* 23* 30* 33*  CREATININE 1.80*   < > 3.74*   < > 2.93* 2.45* 2.10* 2.08* 1.81*  CALCIUM 7.0*   < > 6.7*   < > 6.6* 6.9* 7.2* 7.4* 7.4*  MG 2.5*  --  2.6*  --  2.3  --  2.5*  --  2.7*  PHOS  --   --   --    < > 4.4 3.4 3.5  2.6 2.4*   < > = values in this interval not displayed.   GFR: Estimated Creatinine Clearance: 47.8 mL/min (A) (by C-G formula based on SCr of 1.81 mg/dL (H)). Recent Labs  Lab 02/09/18 1758 02/09/18 2014 02/09/18 2124 02/10/18 0540 02/10/18 0759 02/11/18 0152  02/12/18 0358 02/13/18 0409 02/14/18 0320 02/15/18 0357  PROCALCITON  --   --  29.93 31.41  --  49.28  --   --   --   --   --   WBC  --   --   --  18.2*  --   --    < > 31.1* 24.0* 18.6* 18.5*  LATICACIDVEN 1.74 1.33  --  1.8 1.5  --   --   --   --   --   --    < > = values in this interval not displayed.    Liver Function Tests: Recent Labs  Lab 02/09/18 1725 02/10/18 0540 02/11/18 0152  02/13/18 0409 02/13/18 1555 02/14/18 0320 02/14/18 1630 02/15/18 0357  AST 136* 115* 110*  --  73*  --   --   --   --   ALT 57* 46* 35  --  29  --   --   --   --   ALKPHOS 90 79 76  --  97  --   --   --   --   BILITOT 1.2 1.0 1.0  --  0.9  --   --   --   --   PROT 5.6* 5.1* 4.7*  --  4.7*  --   --   --   --   ALBUMIN 1.7* 1.5* 1.2*   < > 1.0*  1.0* 1.1* 1.1* 1.2* 1.3*   < > = values in this interval not displayed.   Recent Labs  Lab 02/09/18 1725  LIPASE 21   No results for input(s): AMMONIA in the last 168 hours.  ABG    Component Value Date/Time   PHART 7.398 02/15/2018 0551   PCO2ART 43.9 02/15/2018 0551   PO2ART 87.0 02/15/2018 0551   HCO3 27.3 02/15/2018 0551   TCO2 29 02/15/2018 0551   ACIDBASEDEF 1.0  02/13/2018 1335   O2SAT 97.0 02/15/2018 0551     Coagulation Profile: Recent Labs  Lab 02/09/18 1725 02/13/18 0409  INR 1.14 1.07    Cardiac Enzymes: Recent Labs  Lab 02/09/18 2124  TROPONINI 0.05*    HbA1C: No results found for: HGBA1C  CBG: Recent Labs  Lab 02/14/18 1512 02/14/18 1939 02/14/18 2350 02/15/18 0353 02/15/18 0749  GLUCAP 121* 110* 125* 111* 106*    Critical care time: 33 min    Baltazar Apo, MD, PhD 02/15/2018, 10:37 AM  Pulmonary and Critical Care [phone] or if no answer [phone]

## 2018-02-16 ENCOUNTER — Inpatient Hospital Stay (HOSPITAL_COMMUNITY): Payer: BLUE CROSS/BLUE SHIELD

## 2018-02-16 LAB — GLUCOSE, CAPILLARY
Glucose-Capillary: 106 mg/dL — ABNORMAL HIGH (ref 70–99)
Glucose-Capillary: 112 mg/dL — ABNORMAL HIGH (ref 70–99)
Glucose-Capillary: 115 mg/dL — ABNORMAL HIGH (ref 70–99)
Glucose-Capillary: 124 mg/dL — ABNORMAL HIGH (ref 70–99)

## 2018-02-16 LAB — RENAL FUNCTION PANEL
Albumin: 1.4 g/dL — ABNORMAL LOW (ref 3.5–5.0)
Albumin: 1.6 g/dL — ABNORMAL LOW (ref 3.5–5.0)
Anion gap: 11 (ref 5–15)
Anion gap: 13 (ref 5–15)
BUN: 39 mg/dL — ABNORMAL HIGH (ref 6–20)
BUN: 43 mg/dL — ABNORMAL HIGH (ref 6–20)
CO2: 25 mmol/L (ref 22–32)
CO2: 22 mmol/L (ref 22–32)
Calcium: 7.6 mg/dL — ABNORMAL LOW (ref 8.9–10.3)
Calcium: 7.6 mg/dL — ABNORMAL LOW (ref 8.9–10.3)
Chloride: 101 mmol/L (ref 98–111)
Chloride: 100 mmol/L (ref 98–111)
Creatinine, Ser: 1.65 mg/dL — ABNORMAL HIGH (ref 0.61–1.24)
Creatinine, Ser: 1.57 mg/dL — ABNORMAL HIGH (ref 0.61–1.24)
GFR calc Af Amer: 56 mL/min — ABNORMAL LOW (ref >60)
GFR calc Af Amer: 59 mL/min — ABNORMAL LOW (ref >60)
GFR calc non Af Amer: 48 mL/min — ABNORMAL LOW (ref >60)
GFR calc non Af Amer: 51 mL/min — ABNORMAL LOW (ref >60)
Glucose, Bld: 125 mg/dL — ABNORMAL HIGH (ref 70–99)
Glucose, Bld: 114 mg/dL — ABNORMAL HIGH (ref 70–99)
Phosphorus: 1.9 mg/dL — ABNORMAL LOW (ref 2.5–4.6)
Phosphorus: 1.5 mg/dL — ABNORMAL LOW (ref 2.5–4.6)
Potassium: 3.7 mmol/L (ref 3.5–5.1)
Potassium: 3.5 mmol/L (ref 3.5–5.1)
Sodium: 137 mmol/L (ref 135–145)
Sodium: 135 mmol/L (ref 135–145)

## 2018-02-16 LAB — CBC
HCT: 23.7 % — ABNORMAL LOW (ref 39.0–52.0)
Hemoglobin: 7.9 g/dL — ABNORMAL LOW (ref 13.0–17.0)
MCH: 30.5 pg (ref 26.0–34.0)
MCHC: 33.3 g/dL (ref 30.0–36.0)
MCV: 91.5 fL (ref 80.0–100.0)
Platelets: UNDETERMINED 10*3/uL (ref 150–400)
RBC: 2.59 MIL/uL — ABNORMAL LOW (ref 4.22–5.81)
RDW: 16.1 % — ABNORMAL HIGH (ref 11.5–15.5)
WBC: 15.2 10*3/uL — ABNORMAL HIGH (ref 4.0–10.5)
nRBC: 0.3 % — ABNORMAL HIGH (ref 0.0–0.2)

## 2018-02-16 LAB — APTT: aPTT: 37 seconds — ABNORMAL HIGH (ref 24–36)

## 2018-02-16 LAB — MAGNESIUM: Magnesium: 2.6 mg/dL — ABNORMAL HIGH (ref 1.7–2.4)

## 2018-02-16 IMAGING — DX DG CHEST 1V PORT
1 series · 1 of 1 positions shown · non-contrast
Comparison: Radiograph [DATE].

CLINICAL DATA: Acute respiratory failure with hypoxia.

EXAM:
PORTABLE CHEST 1 VIEW

[chest]
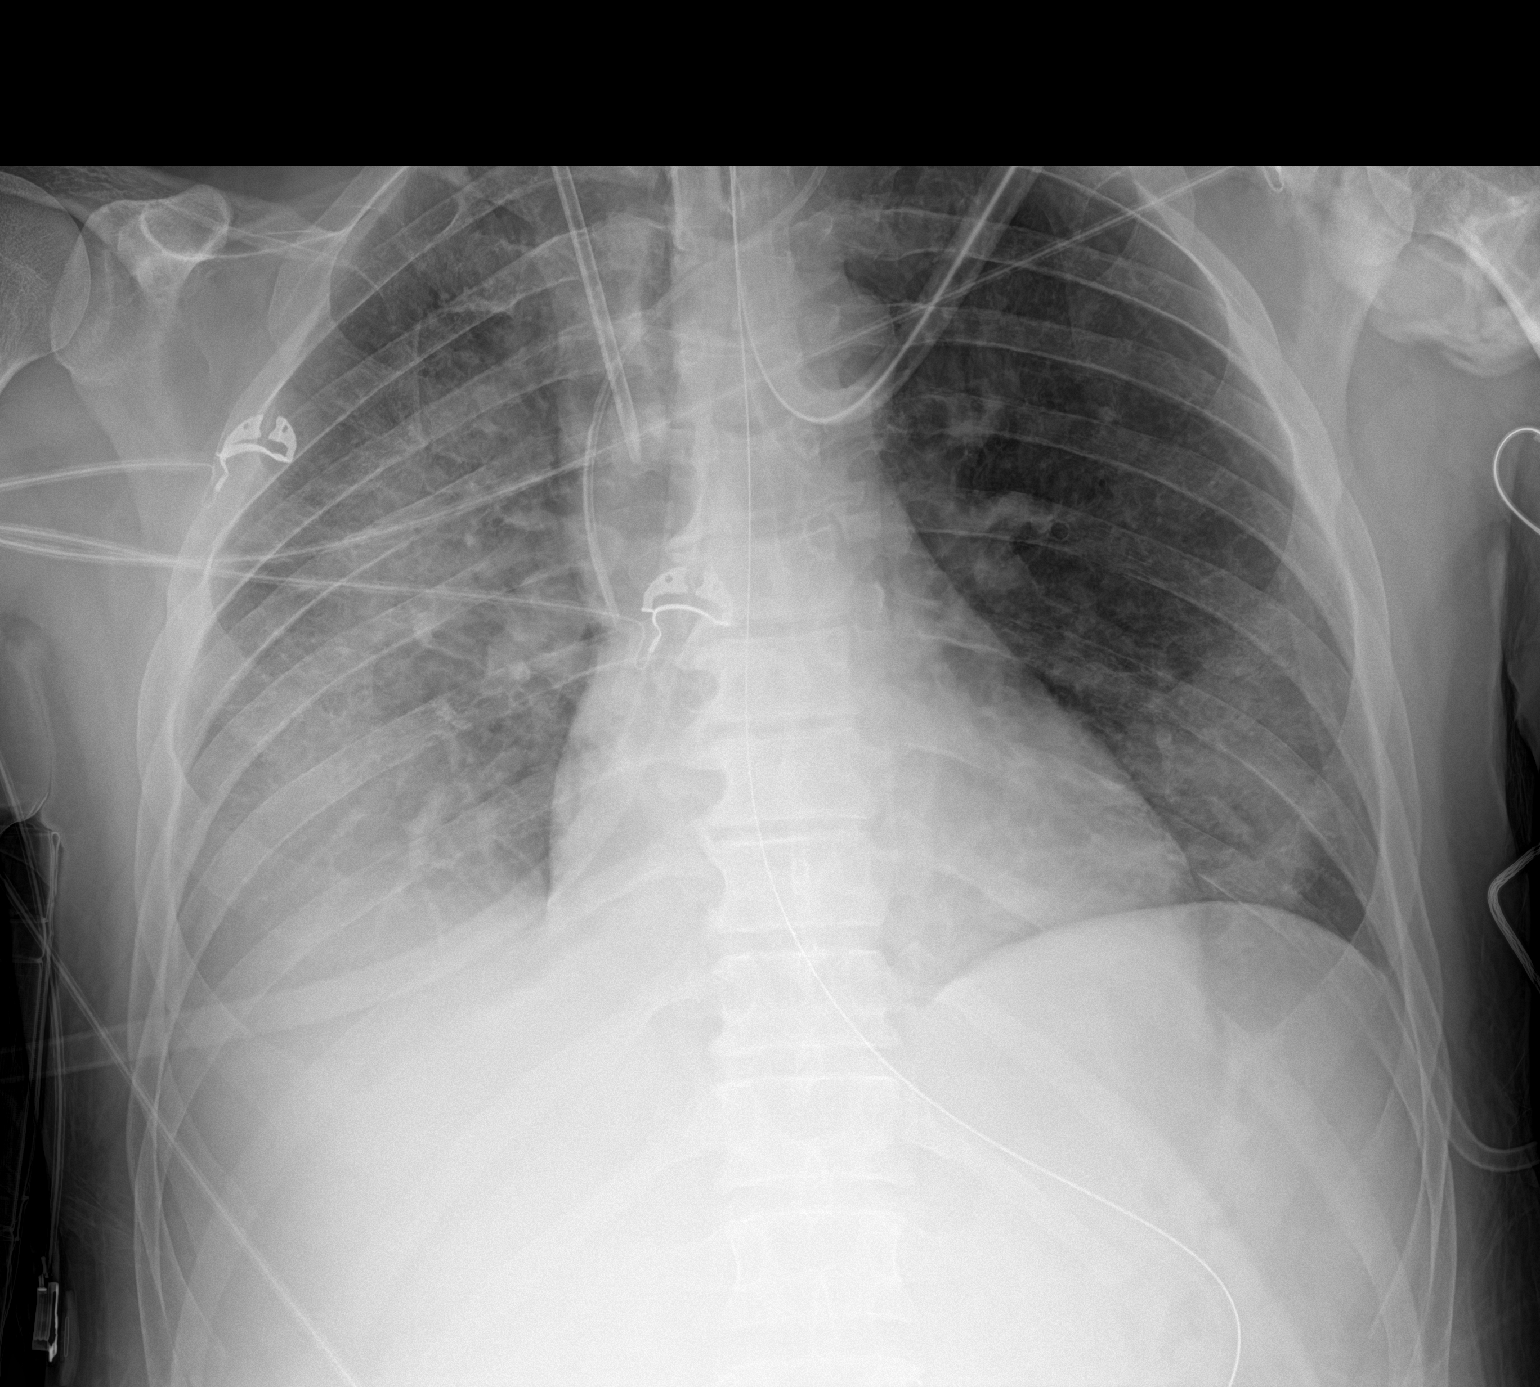

[1 of 1 positions shown; findings below may reference images not displayed]

FINDINGS: The heart size and mediastinal contours are within normal limits.
Endotracheal and nasogastric tubes are unchanged in position.
Bilateral internal jugular catheters are unchanged in position. No
pneumothorax is noted. Stable large right lung opacity is noted
concerning for pneumonia with associated effusion. Stable mild left
basilar airspace opacity is noted concerning for pneumonia.. The
visualized skeletal structures are unremarkable.
IMPRESSION: Stable support apparatus. Stable bilateral lung opacities as
described above, right greater than left.

## 2018-02-16 MED ORDER — SODIUM CHLORIDE 0.9 % IV SOLN
500.0000 [IU]/h | INTRAVENOUS | Status: DC
  Administered 2018-02-16 – 2018-02-17 (×2): 500 [IU]/h via INTRAVENOUS_CENTRAL
  Filled 2018-02-16 (×4): qty 2

## 2018-02-16 NOTE — Progress Notes (Signed)
CRRT filter clotted. Blood returned

## 2018-02-16 NOTE — Progress Notes (Signed)
NAME:  Roberto Knapp, MRN:  [id number], DOB:  1970/04/08, LOS: 7 ADMISSION DATE:  02/09/2018, CONSULTATION DATE:  12/9 REFERRING MD:  Candiss Norse, CHIEF COMPLAINT:  Acute hypoxic respiratory failure    Brief History   59 yom admitted 12/8 w/ CAP, probable gastritis (from NSAIDS) and progressive hypoxic resp failure   History of present illness   47 year old male presented to ED 12/8 w/ cc: fever, body ache, abd pain, N/V, melena, BRBPR, cough and SOB. Initial onset about 10d prior. Initial symptoms were the fever, N/V/D,  body ache, cough shortness of breath for which he was taking advil about every 4 hours, the melena and BRBPR started a few days later. He reported the abd pain as epigastric in nature.  In ER: he was febrile, tachycardic,his RR was in 30s, wbc 18.9, CXR showed marked R>L airspace disease. CT abd/pelvis was neg. FOB pos, influenza PCR neg, UDS was positive for THC. He was admitted to IM service. Started on supplemental oxygen and abx. His respiratory status continued to worsen and by am 12/9 he was titrated up to 100 % on BIPAP w/ marked accessory use and RR in excess of 35 BPM. His HR on tele was in 160s in atrial fib. PCCM asked to see.   Past Medical History  smoker  Significant Hospital Events   12/8 admitted w/ working dx PNA, GIB (prb 2/2 NSAIDs) and acute resp failure 12/9 PCCM called. Resp status progressed. 100% BIPAP. R?L infiltrate on CXR 5/9 paralytics and prone positioning  Consults:  PCCM  Renal  Procedures:  OETT 12/9 >>  Art line 12/9 >>  L IJ HD catheter 12/9 >>   Significant Diagnostic Tests:  CT abdomen/pelvis 12/8 >> extensive bilateral basilar airspace disease, no acute findings in the abdomen or pelvis  Micro Data:  Influenza PCR 12/8: negative Strep antigen 12/8: negative Legionella antigen 12/8>>> positive Respiratory culture 12/9>>> BCX2 12/9>>> Urine 12/8 >> negative BAL 12/9 >> negative Pneumocystis DFA 12/9 >> negative cdiff 12/9:  negative GI Panel 12/9>>> negative  Antimicrobials:  Zosyn 12/9>>> 12/12 Vanc 12/8>>> 12/10 azith 12/9>>> 12/12 Levofloxacin 12/12 >>   Interim history/subjective:  FiO2 currently 40%, PEEP weaned to 8 He is tolerating being less sedated, more interaction with the ventilator without desaturation Tolerated another day of negative fluid balance   Objective   Blood pressure 128/83, pulse 66, temperature (!) 97 F (36.1 C), resp. rate 18, height _0  (1.727 m), weight 75 kg, SpO2 96 %.    Vent Mode: PRVC FiO2 (%):  [40 %-50 %] 40 % Set Rate:  [35 bmp] 35 bmp Vt Set:  [410 mL] 410 mL PEEP:  [5 cmH20-8 cmH20] 5 cmH20 Plateau Pressure:  [16 cmH20-28 cmH20] 16 cmH20   Intake/Output Summary (Last 24 hours) at 02/16/2018 1233 Last data filed at 02/16/2018 1200 Gross per 24 hour  Intake 2612.43 ml  Output 4111 ml  Net -1498.57 ml   Filed Weights   02/14/18 0100 02/15/18 0251 02/16/18 0400  Weight: 82.3 kg 78.5 kg 75 kg    Examination: General: Ill-appearing man, ventilated, supine in bed HENT: ET tube in good position, oropharynx clear, occasional secretions suctioned Lungs: Crackles bilaterally, clearing on the left, still present on the right Cardiovascular: Regular, no murmur Abdomen: Cough, nondistended with positive bowel sounds Extremities: Trace to 1+ lower extremity edema Neuro: Some grimace with interaction, tries to open his eyes, does not follow commands  GU: Catheter in place, scrotal edema  Resolved Hospital  Problem list   Septic shock A Fib + RVR  Assessment & Plan:  Acute Hypoxic respiratory failure in setting of CAP vs aspiration (favor CAP, Legionella antigen positive) w/  ARDS.  No history of vaping in the last year per his wife Plan Continued slow improvement.  Continue ARDS protocol, hopefully PEEP to 5 today.  He is at the place where we can start to lighten sedation, see how he tolerates. Continue levofloxacin monotherapy.  Today is total  antibiotics day 7 of 10. Continue volume removal with CVVHD per nephrology plans  Shock, presumed septic shock, resolved Plan Antibiotics as above Continue stress dose hydrocortisone for 2 more days  afib w/ RVR; currently has perfusing BP, heart rate controlled Plan Amiodarone stopped on 12/13 Anticoagulation deferred given his recent GI blood losses  Acute renal failure, oliguric Mixed anion gap metabolic and respiratory acidosis, improved Plan Continue CVVHD with volume removal Appreciate nephrology management  GIB: Suspect this 2/2 NSAIDS Plan Continue PPI Tolerating tube feeding  Fluid and electrolyte imbalance: hyponatremia, hypokalemia  Plan Follow BMP Replace electrolytes as indicated  Mild LFT elevation, stable to improved 12/12 Plan  Follow LFT 12/16    Best practice:  Diet: Started tube feeds 12/13 Pain/Anxiety/Delirium protocol (if indicated): 12/9 VAP protocol (if indicated): 12/9 DVT prophylaxis: [place] heparin  GI prophylaxis: PPI BID Glucose control: Has not been needed Mobility: BR Code Status: full code  Family Communication: Updated the patient's wife and brother at bedside 12/15 Disposition: ICU  Labs   CBC: Recent Labs  Lab 02/09/18 1725  02/12/18 0358 02/13/18 0409 02/14/18 0320 02/15/18 0357 02/16/18 0420  WBC 18.8*   < > 31.1* 24.0* 18.6* 18.5* 15.2*  NEUTROABS 17.7*  --   --   --   --   --   --   HGB 11.6*   < > 10.4* 10.2* 8.8* 7.9* 7.9*  HCT 33.7*   < > 31.9* 29.4* 26.0* 24.3* 23.7*  MCV 88.5   < > 92.7 89.4 89.0 91.0 91.5  PLT 217   < > 198 161 136* PLATELET CLUMPS NOTED ON SMEAR, UNABLE TO ESTIMATE PLATELET CLUMPS NOTED ON SMEAR, UNABLE TO ESTIMATE   < > = values in this interval not displayed.    Basic Metabolic Panel: Recent Labs  Lab 02/12/18 0358  02/13/18 0409  02/14/18 0320 02/14/18 1630 02/15/18 0357 02/15/18 1632 02/16/18 0420  NA 134*   < > 138   < > 139 140 138 138 137  K 4.0   < > 3.6   < > 3.9 3.7 3.6  3.8 3.7  CL 102   < > 99   < > 102 102 101 101 101  CO2 17*   < > 22   < > _0 GLUCOSE 121*   < > 119*   < > 129* 130* 129* 116* 125*  BUN 39*   < > 28*   < > 23* 30* 33* 32* 39*  CREATININE 3.74*   < > 2.93*   < > 2.10* 2.08* 1.81* 1.59* 1.65*  CALCIUM 6.7*   < > 6.6*   < > 7.2* 7.4* 7.4* 7.8* 7.6*  MG 2.6*  --  2.3  --  2.5*  --  2.7*  --  2.6*  PHOS  --    < > 4.4   < > 3.5 2.6 2.4* 2.4* 1.9*   < > = values in this interval not displayed.   GFR: Estimated  Creatinine Clearance: 52.4 mL/min (A) (by C-G formula based on SCr of 1.65 mg/dL (H)). Recent Labs  Lab 02/09/18 1758 02/09/18 2014 02/09/18 2124 02/10/18 0540 02/10/18 0759 02/11/18 0152  02/13/18 0409 02/14/18 0320 02/15/18 0357 02/16/18 0420  PROCALCITON  --   --  29.93 31.41  --  49.28  --   --   --   --   --   WBC  --   --   --  18.2*  --   --    < > 24.0* 18.6* 18.5* 15.2*  LATICACIDVEN 1.74 1.33  --  1.8 1.5  --   --   --   --   --   --    < > = values in this interval not displayed.    Liver Function Tests: Recent Labs  Lab 02/09/18 1725 02/10/18 0540 02/11/18 0152  02/13/18 0409  02/14/18 0320 02/14/18 1630 02/15/18 0357 02/15/18 1632 02/16/18 0420  AST 136* 115* 110*  --  73*  --   --   --   --   --   --   ALT 57* 46* 35  --  29  --   --   --   --   --   --   ALKPHOS 90 79 76  --  97  --   --   --   --   --   --   BILITOT 1.2 1.0 1.0  --  0.9  --   --   --   --   --   --   PROT 5.6* 5.1* 4.7*  --  4.7*  --   --   --   --   --   --   ALBUMIN 1.7* 1.5* 1.2*   < > 1.0*  1.0*   < > 1.1* 1.2* 1.3* 1.4* 1.4*   < > = values in this interval not displayed.   Recent Labs  Lab 02/09/18 1725  LIPASE 21   No results for input(s): AMMONIA in the last 168 hours.  ABG    Component Value Date/Time   PHART 7.398 02/15/2018 0551   PCO2ART 43.9 02/15/2018 0551   PO2ART 87.0 02/15/2018 0551   HCO3 27.3 02/15/2018 0551   TCO2 29 02/15/2018 0551   ACIDBASEDEF 1.0 02/13/2018 1335   O2SAT 97.0  02/15/2018 0551     Coagulation Profile: Recent Labs  Lab 02/09/18 1725 02/13/18 0409  INR 1.14 1.07    Cardiac Enzymes: Recent Labs  Lab 02/09/18 2124  TROPONINI 0.05*    HbA1C: No results found for: HGBA1C  CBG: Recent Labs  Lab 02/15/18 1942 02/16/18 0024 02/16/18 0422 02/16/18 0748 02/16/18 1134  GLUCAP 113* 124* 112* 115* 106*    Critical care time: 90 min    Baltazar Apo, MD, PhD 02/16/2018, 12:33 PM [facility] [phone] or if no answer [phone]

## 2018-02-16 NOTE — Progress Notes (Signed)
[facility] ROUNDING NOTE   Subjective:   Sedated stable family at bedside.  No new updates.  Some clotting of the filter will start back heparin at 500 to 1000 units/h per CRRT  Blood pressure 128/83 pulse 66 temperature 97  IV Levaquin  IV amiodarone  Removal of 4.3 L CRRT 02/15/2018 2D echo 40 to 45% EF hypokinesia noted.  02/11/2018  Sodium 137 potassium 3.7 chloride 101 CO2 25 BUN 39 creatinine 1.65 glucose 125 calcium 7.6 phosphorus 1.9 albumin 1.4 hemoglobin 7.9 WBC 15.2 platelets clumped.  Chest x-ray shows bilateral stable lower lobe opacities   Objective:  Vital signs in last 24 hours:  Temp:  [95.9 F (35.5 C)-98.1 F (36.7 C)] 97 F (36.1 C) (12/15 1200) Pulse Rate:  [59-87] 66 (12/15 1200) Resp:  [15-44] 18 (12/15 1200) BP: (109-185)/(70-83) 128/83 (12/15 1200) SpO2:  [91 %-100 %] 96 % (12/15 1200) Arterial Line BP: (123-179)/(37-78) 179/73 (12/15 1200) FiO2 (%):  [40 %-50 %] 40 % (12/15 1107) Weight:  [75 kg] 75 kg (12/15 0400)  Weight change: -3.5 kg Filed Weights   02/14/18 0100 02/15/18 0251 02/16/18 0400  Weight: 82.3 kg 78.5 kg 75 kg    Intake/Output: I/O last 3 completed shifts: In: 3818.5 [I.V.:1788.6; Other:30; NG/GT:1950; IV Piggyback:50] Out: 5638 [Urine:5; VFIEP:3295; Stool:325]   Intake/Output this shift:  Total I/O In: 532.7 [I.V.:232.6; NG/GT:250; IV Piggyback:50.1] Out: 621 [Other:621] ET tube placed ventilator CVS-tachycardic no rubs gallops RS-coarse respiratory breath sounds ABD-hypoactive bowel sounds EXT- no edema   Basic Metabolic Panel: Recent Labs  Lab 02/12/18 0358  02/13/18 0409  02/14/18 0320 02/14/18 1630 02/15/18 0357 02/15/18 1632 02/16/18 0420  NA 134*   < > 138   < > 139 140 138 138 137  K 4.0   < > 3.6   < > 3.9 3.7 3.6 3.8 3.7  CL 102   < > 99   < > 102 102 101 101 101  CO2 17*   < > 22   < > _0 GLUCOSE 121*   < > 119*   < > 129* 130* 129* 116* 125*  BUN 39*   < > 28*   <  > 23* 30* 33* 32* 39*  CREATININE 3.74*   < > 2.93*   < > 2.10* 2.08* 1.81* 1.59* 1.65*  CALCIUM 6.7*   < > 6.6*   < > 7.2* 7.4* 7.4* 7.8* 7.6*  MG 2.6*  --  2.3  --  2.5*  --  2.7*  --  2.6*  PHOS  --    < > 4.4   < > 3.5 2.6 2.4* 2.4* 1.9*   < > = values in this interval not displayed.    Liver Function Tests: Recent Labs  Lab 02/09/18 1725 02/10/18 0540 02/11/18 0152  02/13/18 0409  02/14/18 0320 02/14/18 1630 02/15/18 0357 02/15/18 1632 02/16/18 0420  AST 136* 115* 110*  --  73*  --   --   --   --   --   --   ALT 57* 46* 35  --  29  --   --   --   --   --   --   ALKPHOS 90 79 76  --  97  --   --   --   --   --   --   BILITOT 1.2 1.0 1.0  --  0.9  --   --   --   --   --   --  PROT 5.6* 5.1* 4.7*  --  4.7*  --   --   --   --   --   --   ALBUMIN 1.7* 1.5* 1.2*   < > 1.0*  1.0*   < > 1.1* 1.2* 1.3* 1.4* 1.4*   < > = values in this interval not displayed.   Recent Labs  Lab 02/09/18 1725  LIPASE 21   No results for input(s): AMMONIA in the last 168 hours.  CBC: Recent Labs  Lab 02/09/18 1725  02/12/18 0358 02/13/18 0409 02/14/18 0320 02/15/18 0357 02/16/18 0420  WBC 18.8*   < > 31.1* 24.0* 18.6* 18.5* 15.2*  NEUTROABS 17.7*  --   --   --   --   --   --   HGB 11.6*   < > 10.4* 10.2* 8.8* 7.9* 7.9*  HCT 33.7*   < > 31.9* 29.4* 26.0* 24.3* 23.7*  MCV 88.5   < > 92.7 89.4 89.0 91.0 91.5  PLT 217   < > 198 161 136* PLATELET CLUMPS NOTED ON SMEAR, UNABLE TO ESTIMATE PLATELET CLUMPS NOTED ON SMEAR, UNABLE TO ESTIMATE   < > = values in this interval not displayed.    Cardiac Enzymes: Recent Labs  Lab 02/09/18 2124  TROPONINI 0.05*    BNP: Invalid input(s): POCBNP  CBG: Recent Labs  Lab 02/15/18 1942 02/16/18 0024 02/16/18 0422 02/16/18 0748 02/16/18 1134  GLUCAP 113* 124* 112* 115* 106*    Microbiology: Results for orders placed or performed during the hospital encounter of 02/09/18  Culture, blood (routine x 2)     Status: None   Collection Time:  02/09/18  5:30 PM  Result Value Ref Range Status   Specimen Description BLOOD LEFT ANTECUBITAL  Final   Special Requests   Final    BOTTLES DRAWN AEROBIC AND ANAEROBIC Blood Culture adequate volume   Culture   Final    NO GROWTH 5 DAYS Performed at [facility], [place] [address] [place], [place] [address]    Report Status 02/14/2018 FINAL  Final  Urine culture     Status: None   Collection Time: 02/09/18  6:16 PM  Result Value Ref Range Status   Specimen Description URINE, RANDOM  Final   Special Requests NONE  Final   Culture   Final    NO GROWTH Performed at [facility], [place] [address] [place], [place] [address]    Report Status 02/10/2018 FINAL  Final  Culture, blood (routine x 2)     Status: None   Collection Time: 02/09/18  6:16 PM  Result Value Ref Range Status   Specimen Description BLOOD BLOOD RIGHT WRIST  Final   Special Requests   Final    BOTTLES DRAWN AEROBIC AND ANAEROBIC Blood Culture adequate volume   Culture   Final    NO GROWTH 5 DAYS Performed at [facility], [place] [address], [place], [place] [address]    Report Status 02/14/2018 FINAL  Final  Gastrointestinal Panel by PCR , Stool     Status: None   Collection Time: 02/09/18  6:36 PM  Result Value Ref Range Status   Campylobacter species NOT DETECTED NOT DETECTED Final   Plesimonas shigelloides NOT DETECTED NOT DETECTED Final   Salmonella species NOT DETECTED NOT DETECTED Final   Yersinia enterocolitica NOT DETECTED NOT DETECTED Final   Vibrio species NOT DETECTED NOT DETECTED Final   Vibrio cholerae NOT DETECTED NOT DETECTED Final  Enteroaggregative E coli (EAEC) NOT DETECTED NOT DETECTED Final   Enteropathogenic E coli (EPEC) NOT DETECTED NOT DETECTED Final   Enterotoxigenic E coli (ETEC) NOT DETECTED NOT DETECTED Final   Shiga like toxin producing E coli (STEC) NOT DETECTED NOT DETECTED Final   Shigella/Enteroinvasive E coli (EIEC) NOT DETECTED NOT DETECTED Final   Cryptosporidium  NOT DETECTED NOT DETECTED Final   Cyclospora cayetanensis NOT DETECTED NOT DETECTED Final   Entamoeba histolytica NOT DETECTED NOT DETECTED Final   Giardia lamblia NOT DETECTED NOT DETECTED Final   Adenovirus F40/41 NOT DETECTED NOT DETECTED Final   Astrovirus NOT DETECTED NOT DETECTED Final   Norovirus GI/GII NOT DETECTED NOT DETECTED Final   Rotavirus A NOT DETECTED NOT DETECTED Final   Sapovirus (I, II, IV, and V) NOT DETECTED NOT DETECTED Final    Comment: Performed at [facility], [place] [place], [place] [address]  C difficile quick scan w PCR reflex     Status: None   Collection Time: 02/09/18  6:36 PM  Result Value Ref Range Status   C Diff antigen NEGATIVE NEGATIVE Final   C Diff toxin NEGATIVE NEGATIVE Final   C Diff interpretation No C. difficile detected.  Final    Comment: Performed at [facility], [place] [address], [place], [place] [address]  MRSA PCR Screening     Status: None   Collection Time: 02/10/18  1:34 AM  Result Value Ref Range Status   MRSA by PCR NEGATIVE NEGATIVE Final    Comment:        The GeneXpert MRSA Assay (FDA approved for NASAL specimens only), is one component of a comprehensive MRSA colonization surveillance program. It is not intended to diagnose MRSA infection nor to guide or monitor treatment for MRSA infections. Performed at [facility], [place] [address] [place], [place] [address]   Culture, bal-quantitative     Status: None   Collection Time: 02/10/18  1:26 PM  Result Value Ref Range Status   Specimen Description BRONCHIAL ALVEOLAR LAVAGE  Final   Special Requests Normal  Final   Gram Stain   Final    FEW WBC PRESENT,BOTH PMN AND MONONUCLEAR NO ORGANISMS SEEN    Culture   Final    NO GROWTH 2 DAYS Performed at [facility], [place] [address], [place], [place] [address]    Report Status 02/12/2018 FINAL  Final  Pneumocystis smear by DFA     Status: None   Collection Time: 02/10/18  1:26 PM   Result Value Ref Range Status   Specimen Source-PJSRC BRONCHIAL ALVEOLAR LAVAGE  Final   Pneumocystis jiroveci Ag NEGATIVE  Final    Comment: Performed at [facility] Performed at [facility], [address] [place], [place] [address]     Coagulation Studies: No results for input(s): LABPROT, INR in the last 72 hours.  Urinalysis: No results for input(s): COLORURINE, LABSPEC, PHURINE, GLUCOSEU, HGBUR, BILIRUBINUR, KETONESUR, PROTEINUR, UROBILINOGEN, NITRITE, LEUKOCYTESUR in the last 72 hours.  Invalid input(s): APPERANCEUR    Imaging: Dg Chest Port 1 View  Result Date: 02/16/2018 CLINICAL DATA:  Acute respiratory failure with hypoxia. EXAM: PORTABLE CHEST 1 VIEW COMPARISON:  Radiograph February 15, 2018. FINDINGS: The heart size and mediastinal contours are within normal limits. Endotracheal and nasogastric tubes are unchanged in position. Bilateral internal jugular catheters are unchanged in position. No pneumothorax is noted. Stable large right lung opacity is noted concerning for pneumonia with associated effusion.  Stable mild left basilar airspace opacity is noted concerning for pneumonia. The visualized skeletal structures are unremarkable. IMPRESSION: Stable support apparatus. Stable bilateral lung opacities as described above, right greater than left. Electronically Signed   By: Marijo Conception, M.D.   On: 02/16/2018 07:44   Dg Chest Port 1 View  Result Date: 02/15/2018 CLINICAL DATA:  Acute respiratory failure. EXAM: PORTABLE CHEST 1 VIEW COMPARISON:  02/14/2018 FINDINGS: Enteric tube tip is above the carina. There is a right IJ catheter with tip in the projection of the SVC. Left IJ catheter tip is at the level of the cavoatrial junction. Normal heart size. Bilateral airspace disease, right greater than left. Not significantly changed from previous exam. IMPRESSION: 1. No significant change in aeration to the lungs compared with previous study. 2. Stable support  apparatus. Electronically Signed   By: Kerby Moors M.D.   On: 02/15/2018 08:24     Medications:   .  prismasol BGK 4/2.5 500 mL/hr at 02/16/18 0449  .  prismasol BGK 4/2.5 500 mL/hr at 02/16/18 0448  . sodium chloride 10 mL/hr at 02/16/18 1200  . fentaNYL infusion INTRAVENOUS 300 mcg/hr (02/16/18 1203)  . heparin 10,000 units/ 20 mL infusion syringe 500 Units/hr (02/16/18 0739)  . levofloxacin (LEVAQUIN) IV Stopped (02/16/18 0930)  . midazolam (VERSED) infusion 4 mg/hr (02/16/18 1204)  . norepinephrine (LEVOPHED) Adult infusion Stopped (02/13/18 1109)  . phenylephrine (NEO-SYNEPHRINE) Adult infusion Stopped (02/13/18 2248)  . prismasol BGK 4/2.5 2,000 mL/hr at 02/16/18 0450  . sodium chloride     . chlorhexidine gluconate (MEDLINE KIT)  15 mL Mouth Rinse BID  . Chlorhexidine Gluconate Cloth  6 each Topical Q0600  . feeding supplement (PRO-STAT SUGAR FREE 64)  30 mL Per Tube QID  . feeding supplement (VITAL HIGH PROTEIN)  1,000 mL Per Tube Q24H  . hydrocortisone sod succinate (SOLU-CORTEF) inj  50 mg Intravenous Q6H  . Influenza vac split quadrivalent PF  0.5 mL Intramuscular Tomorrow-1000  . mouth rinse  15 mL Mouth Rinse 10 times per day  . pantoprazole (PROTONIX) IV  40 mg Intravenous Q12H  . sodium chloride flush  3 mL Intravenous Q12H   sodium chloride, acetaminophen, acetaminophen **OR** [DISCONTINUED] acetaminophen, fentaNYL, heparin, heparin, levalbuterol, midazolam, [DISCONTINUED] ondansetron **OR** ondansetron (ZOFRAN) IV, sodium chloride  Assessment/ Plan:   Acute kidney injury in setting of hypotension, sepsis and nonsteroidal anti-inflammatory drug use and CT scan of abdomen pelvis performed with IV contrast on 02/07/2018.  CRRT initiated appears to be stable on this therapy will continue.  There appears to be no improvement in urine output at this time and will continue CRRT for now.  He appears to be improving clinically we will continue to follow.  Will add back  heparin cautiously due to filter clotting.  Legionella pneumonia.  Positive for urine Legionella serology continues on Levaquin  Hypotension appears to have resolved with improved pressures EF 40 to 45% on 2D echo 02/11/2018 secondary to septic shock.  Sepsis IV Levaquin and stress dose steroids  Metabolic acidosis resolved  Atrial fibrillation secondary to sepsis continues on IV amiodarone  GI prophylaxis with Protonix 40 mg every 12 hours.  COPD with respiratory failure still requiring 50% FiO2    LOS: [place] _0 _1 :19 PM

## 2018-02-17 ENCOUNTER — Inpatient Hospital Stay (HOSPITAL_COMMUNITY): Payer: BLUE CROSS/BLUE SHIELD

## 2018-02-17 LAB — CBC
HCT: 26.1 % — ABNORMAL LOW (ref 39.0–52.0)
Hemoglobin: 8.6 g/dL — ABNORMAL LOW (ref 13.0–17.0)
MCH: 30.2 pg (ref 26.0–34.0)
MCHC: 33 g/dL (ref 30.0–36.0)
MCV: 91.6 fL (ref 80.0–100.0)
Platelets: UNDETERMINED 10*3/uL (ref 150–400)
RBC: 2.85 MIL/uL — ABNORMAL LOW (ref 4.22–5.81)
RDW: 15.7 % — ABNORMAL HIGH (ref 11.5–15.5)
WBC: 14.1 10*3/uL — ABNORMAL HIGH (ref 4.0–10.5)
nRBC: 0.2 % (ref 0.0–0.2)

## 2018-02-17 LAB — RENAL FUNCTION PANEL
Albumin: 1.6 g/dL — ABNORMAL LOW (ref 3.5–5.0)
Anion gap: 11 (ref 5–15)
BUN: 47 mg/dL — ABNORMAL HIGH (ref 6–20)
CO2: 24 mmol/L (ref 22–32)
Calcium: 7.9 mg/dL — ABNORMAL LOW (ref 8.9–10.3)
Chloride: 101 mmol/L (ref 98–111)
Creatinine, Ser: 1.6 mg/dL — ABNORMAL HIGH (ref 0.61–1.24)
GFR calc Af Amer: 58 mL/min — ABNORMAL LOW (ref >60)
GFR calc non Af Amer: 50 mL/min — ABNORMAL LOW (ref >60)
Glucose, Bld: 110 mg/dL — ABNORMAL HIGH (ref 70–99)
Phosphorus: 2.1 mg/dL — ABNORMAL LOW (ref 2.5–4.6)
Potassium: 3.8 mmol/L (ref 3.5–5.1)
Sodium: 136 mmol/L (ref 135–145)

## 2018-02-17 LAB — GLUCOSE, CAPILLARY
Glucose-Capillary: 100 mg/dL — ABNORMAL HIGH (ref 70–99)
Glucose-Capillary: 104 mg/dL — ABNORMAL HIGH (ref 70–99)
Glucose-Capillary: 104 mg/dL — ABNORMAL HIGH (ref 70–99)
Glucose-Capillary: 106 mg/dL — ABNORMAL HIGH (ref 70–99)
Glucose-Capillary: 111 mg/dL — ABNORMAL HIGH (ref 70–99)
Glucose-Capillary: 115 mg/dL — ABNORMAL HIGH (ref 70–99)
Glucose-Capillary: 119 mg/dL — ABNORMAL HIGH (ref 70–99)
Glucose-Capillary: 95 mg/dL (ref 70–99)

## 2018-02-17 LAB — MAGNESIUM: Magnesium: 2.6 mg/dL — ABNORMAL HIGH (ref 1.7–2.4)

## 2018-02-17 LAB — APTT: aPTT: 32 seconds (ref 24–36)

## 2018-02-17 IMAGING — DX DG CHEST 1V PORT
1 series · 1 of 1 positions shown · non-contrast
Comparison: [DATE]

CLINICAL DATA: Ventilator support.

EXAM:
PORTABLE CHEST 1 VIEW

[chest ap]
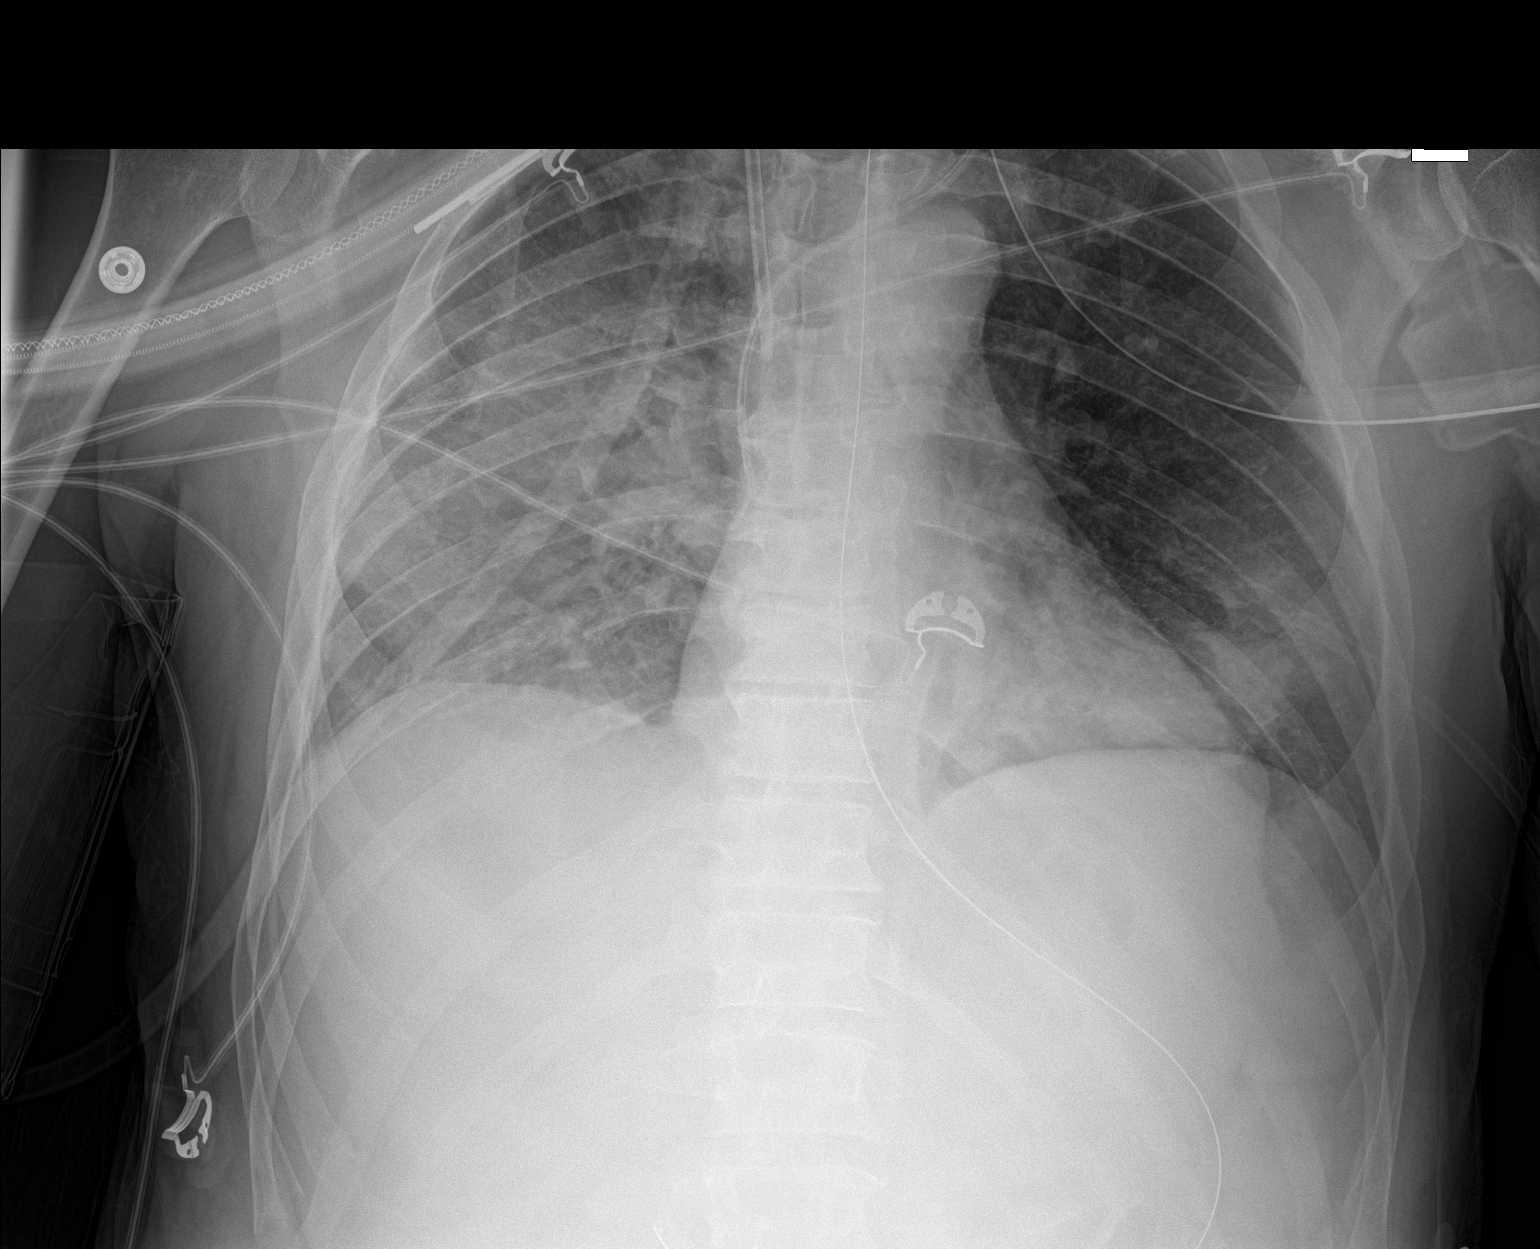

[1 of 1 positions shown; findings below may reference images not displayed]

FINDINGS: Endotracheal tube tip is 6 cm above the carina. Nasogastric tube
enters the stomach. Right internal jugular central line tip in the
SVC at the azygos level. Left internal jugular central line tip in
the SVC just above the right atrium. Infiltrate throughout the right
lower lobe and to a lesser extent within the left lower lobe appear
similar. No worsening or new finding.
IMPRESSION: No change. Lines and tubes satisfactory. Bilateral lower lobe
pneumonia right more extensive than left.

## 2018-02-17 MED ORDER — HYDROCORTISONE NA SUCCINATE PF 100 MG IJ SOLR
50.0000 mg | Freq: Four times a day (QID) | INTRAMUSCULAR | Status: AC
  Administered 2018-02-17 – 2018-02-18 (×6): 50 mg via INTRAVENOUS
  Filled 2018-02-17 (×6): qty 2

## 2018-02-17 MED ORDER — HYDROCORTISONE NA SUCCINATE PF 100 MG IJ SOLR: 50.0000 mg | Freq: Two times a day (BID) | INTRAMUSCULAR | Status: DC

## 2018-02-17 MED ORDER — HYDROCORTISONE NA SUCCINATE PF 100 MG IJ SOLR: 50.0000 mg | Freq: Every day | INTRAMUSCULAR | Status: DC

## 2018-02-17 MED ORDER — LEVOFLOXACIN IN D5W 500 MG/100ML IV SOLN
500.0000 mg | INTRAVENOUS | Status: AC
  Administered 2018-02-19 – 2018-02-23 (×3): 500 mg via INTRAVENOUS
  Filled 2018-02-17 (×3): qty 100

## 2018-02-17 MED ORDER — HYDROCORTISONE NA SUCCINATE PF 100 MG IJ SOLR: 50.0000 mg | Freq: Three times a day (TID) | INTRAMUSCULAR | Status: DC

## 2018-02-17 NOTE — Progress Notes (Signed)
Patient off sedation and weaning from ventilator as per Dr Molli KnockYacoub orders for 55 minutes. Patient becomes restless increase respirations patient placed back on full support and sedation medications restarted

## 2018-02-17 NOTE — Progress Notes (Signed)
NAME:  Roberto Knapp, MRN:  [id number], DOB:  1970-10-07, LOS: 8 ADMISSION DATE:  02/09/2018, CONSULTATION DATE:  12/9 REFERRING MD:  Candiss Norse, CHIEF COMPLAINT:  Acute hypoxic respiratory failure    Brief History   81 yom admitted 12/8 w/ CAP, probable gastritis (from NSAIDS) and progressive hypoxic resp failure   History of present illness   47 year old male presented to ED 12/8 w/ cc: fever, body ache, abd pain, N/V, melena, BRBPR, cough and SOB. Initial onset about 10d prior. Initial symptoms were the fever, N/V/D,  body ache, cough shortness of breath for which he was taking advil about every 4 hours, the melena and BRBPR started a few days later. He reported the abd pain as epigastric in nature.  In ER: he was febrile, tachycardic,his RR was in 30s, wbc 18.9, CXR showed marked R>L airspace disease. CT abd/pelvis was neg. FOB pos, influenza PCR neg, UDS was positive for THC. He was admitted to IM service. Started on supplemental oxygen and abx. His respiratory status continued to worsen and by am 12/9 he was titrated up to 100 % on BIPAP w/ marked accessory use and RR in excess of 35 BPM. His HR on tele was in 160s in atrial fib. PCCM asked to see.   Past Medical History  smoker  Significant Hospital Events   12/8 admitted w/ working dx PNA, GIB (prb 2/2 NSAIDs) and acute resp failure 12/9 PCCM called. Resp status progressed. 100% BIPAP. R?L infiltrate on CXR 5/9 paralytics and prone positioning  Consults:  PCCM  Renal  Procedures:  OETT 12/9 >>  Art line 12/9 >>  L IJ HD catheter 12/9 >>   Significant Diagnostic Tests:  CT abdomen/pelvis 12/8 >> extensive bilateral basilar airspace disease, no acute findings in the abdomen or pelvis  Micro Data:  Influenza PCR 12/8: negative Strep antigen 12/8: negative Legionella antigen 12/8>>> positive Respiratory culture 12/9>>> BCX2 12/9>>> Urine 12/8 >> negative BAL 12/9 >> negative Pneumocystis DFA 12/9 >> negative cdiff 12/9:  negative GI Panel 12/9>>> negative  Antimicrobials:  Zosyn 12/9>>> 12/12 Vanc 12/8>>> 12/10 azith 12/9>>> 12/12 Levofloxacin 12/12 >>   Interim history/subjective:  No events overnight, weaning well this AM  Objective   Blood pressure 129/88, pulse 81, temperature (!) 97.2 F (36.2 C), resp. rate (!) 23, height _0  (1.727 m), weight 71.4 kg, SpO2 96 %.    Vent Mode: PRVC FiO2 (%):  [40 %] 40 % Set Rate:  [35 bmp] 35 bmp Vt Set:  [410 mL] 410 mL PEEP:  [5 cmH20] 5 cmH20 Plateau Pressure:  [13 cmH20-23 cmH20] 13 cmH20   Intake/Output Summary (Last 24 hours) at 02/17/2018 1113 Last data filed at 02/17/2018 1000 Gross per 24 hour  Intake 2271.2 ml  Output 4641 ml  Net -2369.8 ml   Filed Weights   02/15/18 0251 02/16/18 0400 02/17/18 0400  Weight: 78.5 kg 75 kg 71.4 kg    Examination: General: Acutely ill appearing male, NAD HENT: [place]/AT, PERRL, EOM-I and MMM Lungs: Diffuse crackles in all lung fields Cardiovascular: RRR, Nl S1/S2 and -M/R/G Abdomen: Soft, NT, ND and +BS Extremities: -edema and -tenderness Neuro: Opens eyes to voice Skin: Intact  Resolved Hospital Problem list   Septic shock A Fib + RVR  Assessment & Plan:  Acute Hypoxic respiratory failure in setting of CAP vs aspiration (favor CAP, Legionella antigen positive) w/  ARDS.  No history of vaping in the last year per his wife Plan Begin PS trials  today, may attempt extubation if continues to do well Titrate O2 for sat of 88-92% D/C sedation for potential extubation today  Shock, presumed septic shock, resolved Plan Abx as above, 10 days total to be stopped 12/22 Continue stress dose hydrocortisone for 1 more days  afib w/ RVR; currently has perfusing BP, heart rate controlled Plan Amiodarone stopped on 12/13 Anticoagulation deferred given his recent GI blood losses  Acute renal failure, oliguric Mixed anion gap metabolic and respiratory acidosis, improved Plan D/C CVVHD Appreciate  nephrology management BMET in AM Replace electrolytes as indicated HD per renal  GIB: Suspect this 2/2 NSAIDS Plan Continue PPI Hold TF for potential extubation today  Fluid and electrolyte imbalance: hyponatremia, hypokalemia  Plan Follow BMP Replace electrolytes as indicated HD per renal  Mild LFT elevation, stable to improved 12/12 Plan  Follow LFT 12/16    Best practice:  Diet: Started tube feeds 12/13 Pain/Anxiety/Delirium protocol (if indicated): 12/9 VAP protocol (if indicated): 12/9 DVT prophylaxis: [place] heparin  GI prophylaxis: PPI BID Glucose control: Has not been needed Mobility: BR Code Status: full code  Family Communication: Updated the patient's wife and brother at bedside 12/15 Disposition: ICU  Labs   CBC: Recent Labs  Lab 02/13/18 0409 02/14/18 0320 02/15/18 0357 02/16/18 0420 02/17/18 0350  WBC 24.0* 18.6* 18.5* 15.2* 14.1*  HGB 10.2* 8.8* 7.9* 7.9* 8.6*  HCT 29.4* 26.0* 24.3* 23.7* 26.1*  MCV 89.4 89.0 91.0 91.5 91.6  PLT 161 136* PLATELET CLUMPS NOTED ON SMEAR, UNABLE TO ESTIMATE PLATELET CLUMPS NOTED ON SMEAR, UNABLE TO ESTIMATE PLATELET CLUMPS NOTED ON SMEAR, UNABLE TO ESTIMATE    Basic Metabolic Panel: Recent Labs  Lab 02/13/18 0409  02/14/18 0320  02/15/18 0357 02/15/18 1632 02/16/18 0420 02/16/18 1828 02/17/18 0350  NA 138   < > 139   < > 138 138 137 135 136  K 3.6   < > 3.9   < > 3.6 3.8 3.7 3.5 3.8  CL 99   < > 102   < > 101 101 101 100 101  CO2 22   < > 25   < > _0 GLUCOSE 119*   < > 129*   < > 129* 116* 125* 114* 110*  BUN 28*   < > 23*   < > 33* 32* 39* 43* 47*  CREATININE 2.93*   < > 2.10*   < > 1.81* 1.59* 1.65* 1.57* 1.60*  CALCIUM 6.6*   < > 7.2*   < > 7.4* 7.8* 7.6* 7.6* 7.9*  MG 2.3  --  2.5*  --  2.7*  --  2.6*  --  2.6*  PHOS 4.4   < > 3.5   < > 2.4* 2.4* 1.9* 1.5* 2.1*   < > = values in this interval not displayed.   GFR: Estimated Creatinine Clearance: 54 mL/min (A) (by C-G formula based on  SCr of 1.6 mg/dL (H)). Recent Labs  Lab 02/11/18 0152  02/14/18 0320 02/15/18 0357 02/16/18 0420 02/17/18 0350  PROCALCITON 49.28  --   --   --   --   --   WBC  --    < > 18.6* 18.5* 15.2* 14.1*   < > = values in this interval not displayed.    Liver Function Tests: Recent Labs  Lab 02/11/18 0152  02/13/18 0409  02/15/18 0357 02/15/18 1632 02/16/18 0420 02/16/18 1828 02/17/18 0350  AST 110*  --  73*  --   --   --   --   --   --  ALT 35  --  29  --   --   --   --   --   --   ALKPHOS 76  --  97  --   --   --   --   --   --   BILITOT 1.0  --  0.9  --   --   --   --   --   --   PROT 4.7*  --  4.7*  --   --   --   --   --   --   ALBUMIN 1.2*   < > 1.0*  1.0*   < > 1.3* 1.4* 1.4* 1.6* 1.6*   < > = values in this interval not displayed.   No results for input(s): LIPASE, AMYLASE in the last 168 hours. No results for input(s): AMMONIA in the last 168 hours.  ABG    Component Value Date/Time   PHART 7.398 02/15/2018 0551   PCO2ART 43.9 02/15/2018 0551   PO2ART 87.0 02/15/2018 0551   HCO3 27.3 02/15/2018 0551   TCO2 29 02/15/2018 0551   ACIDBASEDEF 1.0 02/13/2018 1335   O2SAT 97.0 02/15/2018 0551     Coagulation Profile: Recent Labs  Lab 02/13/18 0409  INR 1.07    Cardiac Enzymes: No results for input(s): CKTOTAL, CKMB, CKMBINDEX, TROPONINI in the last 168 hours.  HbA1C: No results found for: HGBA1C  CBG: Recent Labs  Lab 02/16/18 1522 02/16/18 1956 02/16/18 2334 02/17/18 0411 02/17/18 0731  GLUCAP 104* 104* 111* 106* 119*   The patient is critically ill with multiple organ systems failure and requires high complexity decision making for assessment and support, frequent evaluation and titration of therapies, application of advanced monitoring technologies and extensive interpretation of multiple databases.   Critical Care Time devoted to patient care services described in this note is  32  Minutes. This time reflects time of care of this signee Dr Jennet Maduro. This critical care time does not reflect procedure time, or teaching time or supervisory time of PA/NP/Med student/Med Resident etc but could involve care discussion time.  Rush Farmer, M.D. [facility] Pulmonary/Critical Care Medicine. Pager: [phone]. After hours pager: [phone].

## 2018-02-17 NOTE — Progress Notes (Signed)
CRRT machine alarm showed blood pump malfunction. Small amount of blood returned to patient. CRRT discontinued as per orders

## 2018-02-17 NOTE — Progress Notes (Signed)
Subjective:  Hemodynamically stable, even hypertensive overnight- no UOP Objective Vital signs in last 24 hours: Vitals:   02/17/18 0350 02/17/18 0400 02/17/18 0500 02/17/18 0600  BP:  (!) 146/90 136/87 138/86  Pulse: 74 81 71 73  Resp: (!) 44 (!) 38 (!) 22 20  Temp: (!) 97.2 F (36.2 C) (!) 97.2 F (36.2 C) (!) 97.3 F (36.3 C) (!) 97.3 F (36.3 C)  TempSrc:  Bladder  Bladder  SpO2: 98% 100% 97% 98%  Weight:  71.4 kg    Height:       Weight change: -3.6 kg  Intake/Output Summary (Last 24 hours) at 02/17/2018 3664 Last data filed at 02/17/2018 0600 Gross per 24 hour  Intake 2465.16 ml  Output 5455 ml  Net -2989.84 ml    Assessment/ Plan: Pt is a 47 y.o. yo male baseline crt 1.2 who was admitted on 02/09/2018 with febrile illness/PNA/resp failure developed AKI  Assessment/Plan: 1. Renal- AKI in the setting of above with likely ATN/recent hx of NSAIDS/IV contrast- started CRRT on 12/11.  No UOP so no signs of recovery yet.  No hemodynamic instability- can transition over to IHD.  Will plan to stop CRRT when clots today.  K and phos OK for now 2. PNA - per CCM- VDRF- WBC decreasing 3. Anemia- hgb dropping in house, felt to have GIB possibly gastritis, needed to add heparin to CRRT for clotting- trended up today   4. HTN/volume- overloaded, tolerating fairly aggressive UF- will cont thru the day given his high BP   Louis Meckel    Labs: Basic Metabolic Panel: Recent Labs  Lab 02/16/18 0420 02/16/18 1828 02/17/18 0350  NA 137 135 136  K 3.7 3.5 3.8  CL 101 100 101  CO2 _0 GLUCOSE 125* 114* 110*  BUN 39* 43* 47*  CREATININE 1.65* 1.57* 1.60*  CALCIUM 7.6* 7.6* 7.9*  PHOS 1.9* 1.5* 2.1*   Liver Function Tests: Recent Labs  Lab 02/11/18 0152  02/13/18 0409  02/16/18 0420 02/16/18 1828 02/17/18 0350  AST 110*  --  73*  --   --   --   --   ALT 35  --  29  --   --   --   --   ALKPHOS 76  --  97  --   --   --   --   BILITOT 1.0  --  0.9  --   --    --   --   PROT 4.7*  --  4.7*  --   --   --   --   ALBUMIN 1.2*   < > 1.0*  1.0*   < > 1.4* 1.6* 1.6*   < > = values in this interval not displayed.   No results for input(s): LIPASE, AMYLASE in the last 168 hours. No results for input(s): AMMONIA in the last 168 hours. CBC: Recent Labs  Lab 02/13/18 0409 02/14/18 0320 02/15/18 0357 02/16/18 0420 02/17/18 0350  WBC 24.0* 18.6* 18.5* 15.2* 14.1*  HGB 10.2* 8.8* 7.9* 7.9* 8.6*  HCT 29.4* 26.0* 24.3* 23.7* 26.1*  MCV 89.4 89.0 91.0 91.5 91.6  PLT 161 136* PLATELET CLUMPS NOTED ON SMEAR, UNABLE TO ESTIMATE PLATELET CLUMPS NOTED ON SMEAR, UNABLE TO ESTIMATE PLATELET CLUMPS NOTED ON SMEAR, UNABLE TO ESTIMATE   Cardiac Enzymes: No results for input(s): CKTOTAL, CKMB, CKMBINDEX, TROPONINI in the last 168 hours. CBG: Recent Labs  Lab 02/16/18 1134 02/16/18 1522 02/16/18 1956 02/16/18 2334 02/17/18  0411  GLUCAP 106* 104* 104* 111* 106*    Iron Studies: No results for input(s): IRON, TIBC, TRANSFERRIN, FERRITIN in the last 72 hours. Studies/Results: Dg Chest Port 1 View  Result Date: 02/16/2018 CLINICAL DATA:  Acute respiratory failure with hypoxia. EXAM: PORTABLE CHEST 1 VIEW COMPARISON:  Radiograph February 15, 2018. FINDINGS: The heart size and mediastinal contours are within normal limits. Endotracheal and nasogastric tubes are unchanged in position. Bilateral internal jugular catheters are unchanged in position. No pneumothorax is noted. Stable large right lung opacity is noted concerning for pneumonia with associated effusion. Stable mild left basilar airspace opacity is noted concerning for pneumonia. The visualized skeletal structures are unremarkable. IMPRESSION: Stable support apparatus. Stable bilateral lung opacities as described above, right greater than left. Electronically Signed   By: Marijo Conception, M.D.   On: 02/16/2018 07:44   Medications: Infusions: .  prismasol BGK 4/2.5 500 mL/hr at 02/17/18 0143  .   prismasol BGK 4/2.5 500 mL/hr at 02/17/18 0148  . sodium chloride 10 mL/hr at 02/17/18 0600  . fentaNYL infusion INTRAVENOUS 325 mcg/hr (02/17/18 0600)  . heparin 10,000 units/ 20 mL infusion syringe 500 Units/hr (02/17/18 0223)  . levofloxacin (LEVAQUIN) IV Stopped (02/16/18 0930)  . midazolam (VERSED) infusion 5 mg/hr (02/17/18 0600)  . prismasol BGK 4/2.5 2,000 mL/hr at 02/17/18 0440  . sodium chloride      Scheduled Medications: . chlorhexidine gluconate (MEDLINE KIT)  15 mL Mouth Rinse BID  . Chlorhexidine Gluconate Cloth  6 each Topical Q0600  . feeding supplement (PRO-STAT SUGAR FREE 64)  30 mL Per Tube QID  . feeding supplement (VITAL HIGH PROTEIN)  1,000 mL Per Tube Q24H  . hydrocortisone sod succinate (SOLU-CORTEF) inj  50 mg Intravenous Q6H  . Influenza vac split quadrivalent PF  0.5 mL Intramuscular Tomorrow-1000  . mouth rinse  15 mL Mouth Rinse 10 times per day  . pantoprazole (PROTONIX) IV  40 mg Intravenous Q12H  . sodium chloride flush  3 mL Intravenous Q12H    have reviewed scheduled and prn medications.  Physical Exam: General: sedated, intubated, opens eyes, no commands Heart:  RRR Lungs: BCS bilat Abdomen: soft, non tender Extremities: pitting edema to dep areas Dialysis Access: right IJ vascath placed 12/11    02/17/2018,6:52 AM  LOS: 8 days

## 2018-02-18 ENCOUNTER — Inpatient Hospital Stay (HOSPITAL_COMMUNITY): Payer: BLUE CROSS/BLUE SHIELD

## 2018-02-18 DIAGNOSIS — A481 Legionnaires' disease: Secondary | ICD-10-CM

## 2018-02-18 LAB — GLUCOSE, CAPILLARY
Glucose-Capillary: 110 mg/dL — ABNORMAL HIGH (ref 70–99)
Glucose-Capillary: 111 mg/dL — ABNORMAL HIGH (ref 70–99)
Glucose-Capillary: 113 mg/dL — ABNORMAL HIGH (ref 70–99)
Glucose-Capillary: 119 mg/dL — ABNORMAL HIGH (ref 70–99)
Glucose-Capillary: 133 mg/dL — ABNORMAL HIGH (ref 70–99)
Glucose-Capillary: 141 mg/dL — ABNORMAL HIGH (ref 70–99)

## 2018-02-18 LAB — BASIC METABOLIC PANEL
Anion gap: 14 (ref 5–15)
BUN: 106 mg/dL — ABNORMAL HIGH (ref 6–20)
CO2: 20 mmol/L — ABNORMAL LOW (ref 22–32)
Calcium: 7.9 mg/dL — ABNORMAL LOW (ref 8.9–10.3)
Chloride: 104 mmol/L (ref 98–111)
Creatinine, Ser: 3.65 mg/dL — ABNORMAL HIGH (ref 0.61–1.24)
GFR calc Af Amer: 21 mL/min — ABNORMAL LOW (ref >60)
GFR calc non Af Amer: 18 mL/min — ABNORMAL LOW (ref >60)
Glucose, Bld: 127 mg/dL — ABNORMAL HIGH (ref 70–99)
Potassium: 4.3 mmol/L (ref 3.5–5.1)
Sodium: 138 mmol/L (ref 135–145)

## 2018-02-18 LAB — CBC
HCT: 23.3 % — ABNORMAL LOW (ref 39.0–52.0)
Hemoglobin: 7.6 g/dL — ABNORMAL LOW (ref 13.0–17.0)
MCH: 30.3 pg (ref 26.0–34.0)
MCHC: 32.6 g/dL (ref 30.0–36.0)
MCV: 92.8 fL (ref 80.0–100.0)
Platelets: UNDETERMINED 10*3/uL (ref 150–400)
RBC: 2.51 MIL/uL — ABNORMAL LOW (ref 4.22–5.81)
RDW: 16.2 % — ABNORMAL HIGH (ref 11.5–15.5)
WBC: 12.7 10*3/uL — ABNORMAL HIGH (ref 4.0–10.5)
nRBC: 0.2 % (ref 0.0–0.2)

## 2018-02-18 LAB — POCT I-STAT 3, ART BLOOD GAS (G3+)
Bicarbonate: 24.4 mmol/L (ref 20.0–28.0)
O2 Saturation: 98 %
Patient temperature: 97
TCO2: 26 mmol/L (ref 22–32)
pCO2 arterial: 36.7 mmHg (ref 32.0–48.0)
pH, Arterial: 7.427 (ref 7.350–7.450)
pO2, Arterial: 99 mmHg (ref 83.0–108.0)

## 2018-02-18 LAB — PHOSPHORUS: Phosphorus: 5.5 mg/dL — ABNORMAL HIGH (ref 2.5–4.6)

## 2018-02-18 IMAGING — DX DG CHEST 1V PORT
1 series · 1 of 1 positions shown · non-contrast
Comparison: [DATE]

CLINICAL DATA: Endotracheal tube

EXAM:
PORTABLE CHEST 1 VIEW

[chest ap]
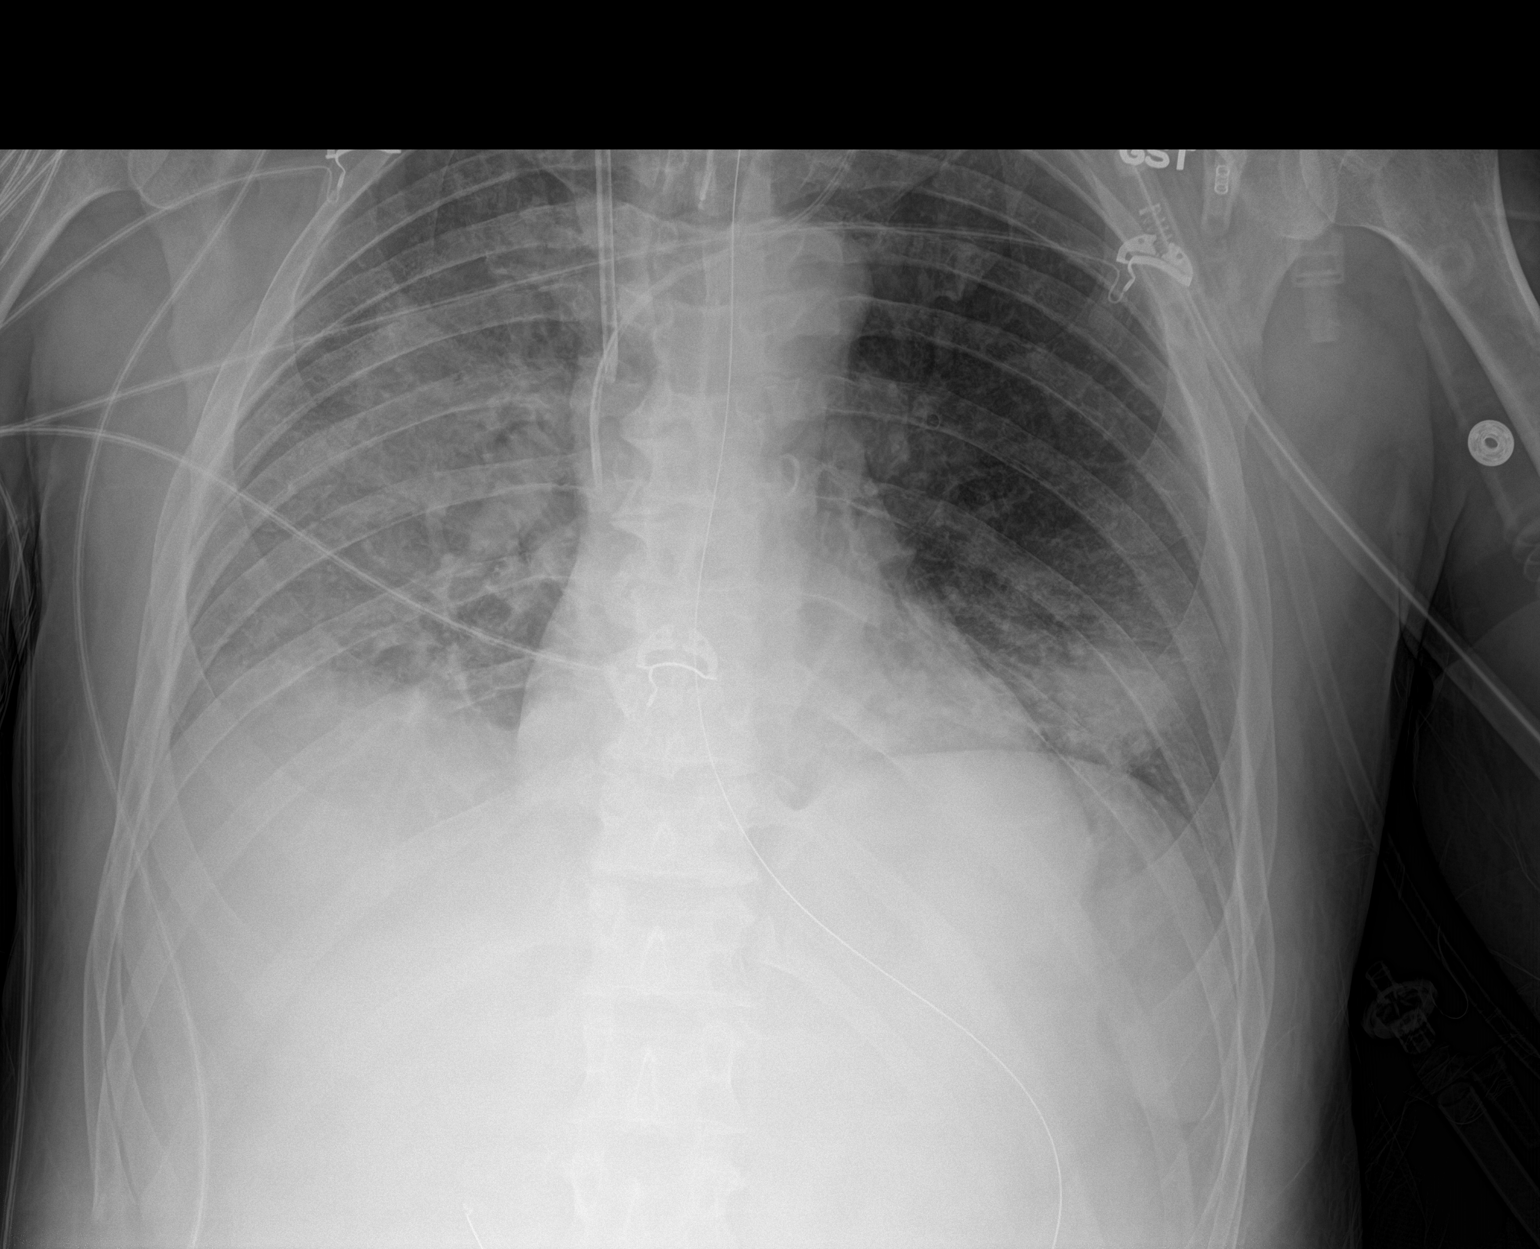

[1 of 1 positions shown; findings below may reference images not displayed]

FINDINGS: Endotracheal tube in good position. Right jugular central venous
catheter tip in the SVC. Left jugular central venous catheter tip in
the SVC. No pneumothorax. NG tube in the stomach.

Asymmetric airspace disease right greater than left unchanged. No
significant effusion.
IMPRESSION: Bilateral airspace disease right greater than left is stable.
Support lines remain in good position. Overall no change from
yesterday.

## 2018-02-18 MED ORDER — SODIUM CHLORIDE 0.9% FLUSH
10.0000 mL | INTRAVENOUS | Status: DC | PRN
  Administered 2018-02-18: 10 mL
  Filled 2018-02-18: qty 40

## 2018-02-18 MED ORDER — CHLORHEXIDINE GLUCONATE CLOTH 2 % EX PADS: 6.0000 | MEDICATED_PAD | Freq: Every day | CUTANEOUS | Status: DC

## 2018-02-18 MED ORDER — FUROSEMIDE 10 MG/ML IJ SOLN
120.0000 mg | Freq: Once | INTRAVENOUS | Status: AC
  Administered 2018-02-18: 120 mg via INTRAVENOUS
  Filled 2018-02-18: qty 10

## 2018-02-18 MED ORDER — DEXMEDETOMIDINE HCL IN NACL 400 MCG/100ML IV SOLN
0.4000 ug/kg/h | INTRAVENOUS | Status: DC
  Administered 2018-02-18: 1 ug/kg/h via INTRAVENOUS
  Administered 2018-02-18: 0.8 ug/kg/h via INTRAVENOUS
  Administered 2018-02-18: 1 ug/kg/h via INTRAVENOUS
  Administered 2018-02-19: 0.9 ug/kg/h via INTRAVENOUS
  Administered 2018-02-19: 0.5 ug/kg/h via INTRAVENOUS
  Filled 2018-02-18 (×3): qty 100
  Filled 2018-02-18: qty 200

## 2018-02-18 MED ORDER — CHLORHEXIDINE GLUCONATE CLOTH 2 % EX PADS
6.0000 | MEDICATED_PAD | Freq: Every day | CUTANEOUS | Status: DC
  Administered 2018-02-19: 6 via TOPICAL

## 2018-02-18 MED ORDER — VITAL AF 1.2 CAL PO LIQD
1000.0000 mL | ORAL | Status: DC
  Administered 2018-02-18: 1000 mL
  Filled 2018-02-18 (×2): qty 1000

## 2018-02-18 MED ORDER — DARBEPOETIN ALFA 100 MCG/0.5ML IJ SOSY
100.0000 ug | PREFILLED_SYRINGE | INTRAMUSCULAR | Status: DC
  Administered 2018-02-18 – 2018-03-04 (×3): 100 ug via SUBCUTANEOUS
  Filled 2018-02-18 (×7): qty 0.5

## 2018-02-18 MED ORDER — CHLORHEXIDINE GLUCONATE CLOTH 2 % EX PADS
6.0000 | MEDICATED_PAD | Freq: Every day | CUTANEOUS | Status: DC
  Administered 2018-02-19 – 2018-02-21 (×3): 6 via TOPICAL

## 2018-02-18 MED ORDER — SODIUM CHLORIDE 0.9% FLUSH
10.0000 mL | Freq: Two times a day (BID) | INTRAVENOUS | Status: DC
  Administered 2018-02-19 – 2018-02-22 (×8): 10 mL
  Administered 2018-02-23: 40 mL
  Administered 2018-02-23 – 2018-03-06 (×21): 10 mL

## 2018-02-18 NOTE — Progress Notes (Signed)
Subjective:  Hemodynamically stable overnight- still minimal UOP but maybe a little more.  No labs yet for today  Objective Vital signs in last 24 hours: Vitals:   02/18/18 0440 02/18/18 0500 02/18/18 0530 02/18/18 0600  BP:  124/74  119/79  Pulse: 88 96  96  Resp: (!) 36 (!) 25  (!) 21  Temp:  (!) 97 F (36.1 C)  (!) 96.8 F (36 C)  TempSrc:      SpO2: 99% 100%  99%  Weight:   72.6 kg   Height:       Weight change: 1.2 kg  Intake/Output Summary (Last 24 hours) at 02/18/2018 0647 Last data filed at 02/18/2018 3536 Gross per 24 hour  Intake 2505.36 ml  Output 940 ml  Net 1565.36 ml    Assessment/ Plan: Pt is a 47 y.o. yo male baseline crt 1.2 who was admitted on 02/09/2018 with febrile illness/PNA/resp failure developed AKI  Assessment/Plan: 1. Renal- AKI in the setting of above with likely ATN/recent hx of NSAIDS/IV contrast- started CRRT on 12/11- stopped at 11 AM on 12/16.  No UOP so no signs of recovery yet.  No labs this AM, will review.  If cont to have no UOP will likely need  IHD tomorrow.   2. PNA - per CCM- VDRF- WBC decreasing-  3. Anemia- hgb dropping in house, felt to have GIB possibly gastritis, trended up yesterday-  Will now add ESA- no iron due to recent sepsis   4. HTN/volume- overloaded, tolerating fairly aggressive UF with CRRT- will challenge again tomorrow    Louis Meckel    Labs: Basic Metabolic Panel: Recent Labs  Lab 02/16/18 0420 02/16/18 1828 02/17/18 0350  NA 137 135 136  K 3.7 3.5 3.8  CL 101 100 101  CO2 '25 22 24  '$ GLUCOSE 125* 114* 110*  BUN 39* 43* 47*  CREATININE 1.65* 1.57* 1.60*  CALCIUM 7.6* 7.6* 7.9*  PHOS 1.9* 1.5* 2.1*   Liver Function Tests: Recent Labs  Lab 02/13/18 0409  02/16/18 0420 02/16/18 1828 02/17/18 0350  AST 73*  --   --   --   --   ALT 29  --   --   --   --   ALKPHOS 97  --   --   --   --   BILITOT 0.9  --   --   --   --   PROT 4.7*  --   --   --   --   ALBUMIN 1.0*  1.0*   < > 1.4* 1.6* 1.6*    < > = values in this interval not displayed.   No results for input(s): LIPASE, AMYLASE in the last 168 hours. No results for input(s): AMMONIA in the last 168 hours. CBC: Recent Labs  Lab 02/14/18 0320 02/15/18 0357 02/16/18 0420 02/17/18 0350 02/18/18 0319  WBC 18.6* 18.5* 15.2* 14.1* 12.7*  HGB 8.8* 7.9* 7.9* 8.6* 7.6*  HCT 26.0* 24.3* 23.7* 26.1* 23.3*  MCV 89.0 91.0 91.5 91.6 92.8  PLT 136* PLATELET CLUMPS NOTED ON SMEAR, UNABLE TO ESTIMATE PLATELET CLUMPS NOTED ON SMEAR, UNABLE TO ESTIMATE PLATELET CLUMPS NOTED ON SMEAR, UNABLE TO ESTIMATE PLATELET CLUMPS NOTED ON SMEAR, UNABLE TO ESTIMATE   Cardiac Enzymes: No results for input(s): CKTOTAL, CKMB, CKMBINDEX, TROPONINI in the last 168 hours. CBG: Recent Labs  Lab 02/17/18 1126 02/17/18 1534 02/17/18 1934 02/17/18 2336 02/18/18 0350  GLUCAP 100* 95 115* 110* 111*    Iron Studies: No  results for input(s): IRON, TIBC, TRANSFERRIN, FERRITIN in the last 72 hours. Studies/Results: Dg Chest Port 1 View  Result Date: 02/17/2018 CLINICAL DATA:  Ventilator support. EXAM: PORTABLE CHEST 1 VIEW COMPARISON:  02/16/2018 FINDINGS: Endotracheal tube tip is 6 cm above the carina. Nasogastric tube enters the stomach. Right internal jugular central line tip in the SVC at the azygos level. Left internal jugular central line tip in the SVC just above the right atrium. Infiltrate throughout the right lower lobe and to a lesser extent within the left lower lobe appear similar. No worsening or new finding. IMPRESSION: No change. Lines and tubes satisfactory. Bilateral lower lobe pneumonia right more extensive than left. Electronically Signed   By: Nelson Chimes M.D.   On: 02/17/2018 06:50   Medications: Infusions: . sodium chloride 10 mL/hr at 02/18/18 0612  . fentaNYL infusion INTRAVENOUS 350 mcg/hr (02/18/18 0500)  . [START ON 02/19/2018] levofloxacin (LEVAQUIN) IV    . midazolam (VERSED) infusion 4 mg/hr (02/18/18 0612)    Scheduled  Medications: . chlorhexidine gluconate (MEDLINE KIT)  15 mL Mouth Rinse BID  . Chlorhexidine Gluconate Cloth  6 each Topical Q0600  . feeding supplement (PRO-STAT SUGAR FREE 64)  30 mL Per Tube QID  . feeding supplement (VITAL HIGH PROTEIN)  1,000 mL Per Tube Q24H  . hydrocortisone sod succinate (SOLU-CORTEF) inj  50 mg Intravenous Q6H  . [START ON 02/19/2018] hydrocortisone sod succinate (SOLU-CORTEF) inj  50 mg Intravenous Q8H   Followed by  . [START ON 02/20/2018] hydrocortisone sod succinate (SOLU-CORTEF) inj  50 mg Intravenous Q12H   Followed by  . [START ON 02/21/2018] hydrocortisone sod succinate (SOLU-CORTEF) inj  50 mg Intravenous Daily  . Influenza vac split quadrivalent PF  0.5 mL Intramuscular Tomorrow-1000  . mouth rinse  15 mL Mouth Rinse 10 times per day  . pantoprazole (PROTONIX) IV  40 mg Intravenous Q12H  . sodium chloride flush  3 mL Intravenous Q12H    have reviewed scheduled and prn medications.  Physical Exam: General: sedated, intubated, opens eyes, no commands Heart:  RRR Lungs: BCS bilat Abdomen: soft, non tender Extremities: pitting edema to dep areas Dialysis Access: right IJ vascath placed 12/11    02/18/2018,6:47 AM  LOS: 9 days

## 2018-02-18 NOTE — Progress Notes (Signed)
NAME:  Roberto Knapp, MRN:  [id number], DOB:  [birth year], LOS: 9 ADMISSION DATE:  02/09/2018, CONSULTATION DATE:  12/9 REFERRING MD:  Candiss Norse, CHIEF COMPLAINT:  Acute hypoxic respiratory failure    Brief History   16 yom admitted 12/8 w/ CAP, probable gastritis (from NSAIDS) and progressive hypoxic resp failure   History of present illness   47 year old male presented to ED 12/8 w/ cc: fever, body ache, abd pain, N/V, melena, BRBPR, cough and SOB. Initial onset about 10d prior. Initial symptoms were the fever, N/V/D,  body ache, cough shortness of breath for which he was taking advil about every 4 hours, the melena and BRBPR started a few days later. He reported the abd pain as epigastric in nature.  In ER: he was febrile, tachycardic,his RR was in 30s, wbc 18.9, CXR showed marked R>L airspace disease. CT abd/pelvis was neg. FOB pos, influenza PCR neg, UDS was positive for THC. He was admitted to IM service. Started on supplemental oxygen and abx. His respiratory status continued to worsen and by am 12/9 he was titrated up to 100 % on BIPAP w/ marked accessory use and RR in excess of 35 BPM. His HR on tele was in 160s in atrial fib. PCCM asked to see.   Past Medical History  smoker  Significant Hospital Events   12/8 admitted w/ working dx PNA, GIB (prb 2/2 NSAIDs) and acute resp failure 12/9 PCCM called. Resp status progressed. 100% BIPAP. R?L infiltrate on CXR 5/9 paralytics and prone positioning  Consults:  PCCM  Renal  Procedures:  OETT 12/9 >>  Art line 12/9 >>  L IJ HD catheter 12/9 >>   Significant Diagnostic Tests:  CT abdomen/pelvis 12/8 >> extensive bilateral basilar airspace disease, no acute findings in the abdomen or pelvis  Micro Data:  Influenza PCR 12/8: negative Strep antigen 12/8: negative Legionella antigen 12/8>>> positive Respiratory culture 12/9>>> BCX2 12/9>>> Urine 12/8 >> negative BAL 12/9 >> negative Pneumocystis DFA 12/9 >> negative cdiff 12/9:  negative GI Panel 12/9>>> negative  Antimicrobials:  Zosyn 12/9>>> 12/12 Vanc 12/8>>> 12/10 azith 12/9>>> 12/12 Levofloxacin 12/12 >>   Interim history/subjective:  Significant vent dyssynchrony this AM Agitated  Objective   Blood pressure (!) 144/87, pulse (!) 103, temperature (!) 97.3 F (36.3 C), resp. rate (!) 24, height _0  (1.727 m), weight 72.6 kg, SpO2 98 %.    Vent Mode: PSV;CPAP FiO2 (%):  [30 %-40 %] 30 % Set Rate:  [35 bmp] 35 bmp Vt Set:  [410 mL] 410 mL PEEP:  [5 cmH20] 5 cmH20 Pressure Support:  [5 cmH20] 5 cmH20 Plateau Pressure:  [14 cmH20-19 cmH20] 18 cmH20   Intake/Output Summary (Last 24 hours) at 02/18/2018 1012 Last data filed at 02/18/2018 0900 Gross per 24 hour  Intake 2285.49 ml  Output 517 ml  Net 1768.49 ml   Filed Weights   02/16/18 0400 02/17/18 0400 02/18/18 0530  Weight: 75 kg 71.4 kg 72.6 kg   Examination: General: Acutely ill appearing male, NAD HENT: [place]/AT, PERRL, OEM-I and MMM, ETT in place Lungs: Diffuse crackles noted Cardiovascular: RRR, Nl S1/S2 and -M/R/G Abdomen: Soft, NT, ND and +BS Extremities: Flank edema noted Neuro: Arousable, moving all ext to command Skin: Intact  Resolved Hospital Problem list   Septic shock A Fib + RVR  Assessment & Plan:  Acute Hypoxic respiratory failure in setting of CAP vs aspiration (favor CAP, Legionella antigen positive) w/  ARDS.  No history of vaping in  the last year per his wife Plan Change to PCV for dyssynchrony now that patient is more awake Lasix as ordered May need trach before this is over, wife is aware Titrate O2 for sat of 88-92%  Shock, presumed septic shock, resolved Plan Abx as above, 10 days total to be stopped 12/22 D/C hydrocortisone after today's dose  afib w/ RVR; currently has perfusing BP, heart rate controlled Plan Amiodarone stopped on 12/13 Anticoagulation deferred given his recent GI blood losses  Acute renal failure, oliguric Mixed anion gap  metabolic and respiratory acidosis, improved Plan iHD at this point, per renal, would like additional volume negative Lasix 120 mg IV x1 Appreciate nephrology management BMET in AM Replace electrolytes as indicated  GIB: Suspect this 2/2 NSAIDS Plan Continue PPI Resume TF  Fluid and electrolyte imbalance: hyponatremia, hypokalemia  Plan Follow BMP Replace electrolytes as indicated HD per renal  Mild LFT elevation, stable to improved 12/12 Plan  Follow LFT 12/16  Best practice:  Diet: Started tube feeds 12/13 Pain/Anxiety/Delirium protocol (if indicated): 12/9 VAP protocol (if indicated): 12/9 DVT prophylaxis: [place] heparin  GI prophylaxis: PPI BID Glucose control: Has not been needed Mobility: BR Code Status: full code  Family Communication: Updated the patient's wife and brother at bedside 12/15 Disposition: ICU  Labs   CBC: Recent Labs  Lab 02/14/18 0320 02/15/18 0357 02/16/18 0420 02/17/18 0350 02/18/18 0319  WBC 18.6* 18.5* 15.2* 14.1* 12.7*  HGB 8.8* 7.9* 7.9* 8.6* 7.6*  HCT 26.0* 24.3* 23.7* 26.1* 23.3*  MCV 89.0 91.0 91.5 91.6 92.8  PLT 136* PLATELET CLUMPS NOTED ON SMEAR, UNABLE TO ESTIMATE PLATELET CLUMPS NOTED ON SMEAR, UNABLE TO ESTIMATE PLATELET CLUMPS NOTED ON SMEAR, UNABLE TO ESTIMATE PLATELET CLUMPS NOTED ON SMEAR, UNABLE TO ESTIMATE    Basic Metabolic Panel: Recent Labs  Lab 02/13/18 0409  02/14/18 0320  02/15/18 0357 02/15/18 1632 02/16/18 0420 02/16/18 1828 02/17/18 0350 02/18/18 0319  NA 138   < > 139   < > 138 138 137 135 136 138  K 3.6   < > 3.9   < > 3.6 3.8 3.7 3.5 3.8 4.3  CL 99   < > 102   < > 101 101 101 100 101 104  CO2 22   < > 25   < > _0 20*  GLUCOSE 119*   < > 129*   < > 129* 116* 125* 114* 110* 127*  BUN 28*   < > 23*   < > 33* 32* 39* 43* 47* 106*  CREATININE 2.93*   < > 2.10*   < > 1.81* 1.59* 1.65* 1.57* 1.60* 3.65*  CALCIUM 6.6*   < > 7.2*   < > 7.4* 7.8* 7.6* 7.6* 7.9* 7.9*  MG 2.3  --  2.5*  --  2.7*   --  2.6*  --  2.6*  --   PHOS 4.4   < > 3.5   < > 2.4* 2.4* 1.9* 1.5* 2.1* 5.5*   < > = values in this interval not displayed.   GFR: Estimated Creatinine Clearance: 23.7 mL/min (A) (by C-G formula based on SCr of 3.65 mg/dL (H)). Recent Labs  Lab 02/15/18 0357 02/16/18 0420 02/17/18 0350 02/18/18 0319  WBC 18.5* 15.2* 14.1* 12.7*    Liver Function Tests: Recent Labs  Lab 02/13/18 0409  02/15/18 0357 02/15/18 1632 02/16/18 0420 02/16/18 1828 02/17/18 0350  AST 73*  --   --   --   --   --   --  ALT 29  --   --   --   --   --   --   ALKPHOS 97  --   --   --   --   --   --   BILITOT 0.9  --   --   --   --   --   --   PROT 4.7*  --   --   --   --   --   --   ALBUMIN 1.0*  1.0*   < > 1.3* 1.4* 1.4* 1.6* 1.6*   < > = values in this interval not displayed.   No results for input(s): LIPASE, AMYLASE in the last 168 hours. No results for input(s): AMMONIA in the last 168 hours.  ABG    Component Value Date/Time   PHART 7.427 02/18/2018 0437   PCO2ART 36.7 02/18/2018 0437   PO2ART 99.0 02/18/2018 0437   HCO3 24.4 02/18/2018 0437   TCO2 26 02/18/2018 0437   ACIDBASEDEF 1.0 02/13/2018 1335   O2SAT 98.0 02/18/2018 0437     Coagulation Profile: Recent Labs  Lab 02/13/18 0409  INR 1.07    Cardiac Enzymes: No results for input(s): CKTOTAL, CKMB, CKMBINDEX, TROPONINI in the last 168 hours.  HbA1C: No results found for: HGBA1C  CBG: Recent Labs  Lab 02/17/18 1534 02/17/18 1934 02/17/18 2336 02/18/18 0350 02/18/18 0812  GLUCAP 95 115* 110* 111* 113*   The patient is critically ill with multiple organ systems failure and requires high complexity decision making for assessment and support, frequent evaluation and titration of therapies, application of advanced monitoring technologies and extensive interpretation of multiple databases.   Critical Care Time devoted to patient care services described in this note is  32  Minutes. This time reflects time of care of  this signee Dr Jennet Maduro. This critical care time does not reflect procedure time, or teaching time or supervisory time of PA/NP/Med student/Med Resident etc but could involve care discussion time.  Rush Farmer, M.D. [facility] Pulmonary/Critical Care Medicine. Pager: [phone]. After hours pager: [phone].

## 2018-02-18 NOTE — Progress Notes (Signed)
Nutrition Follow-up  DOCUMENTATION CODES:   Not applicable  INTERVENTION:   Change TF regimen to better meet re-estimated needs:  Vital AF 1.2 at 65 ml/h (1560 ml per day)  Provides 1872 kcal, 117 gm protein, 1265 ml free water daily  D/C Pro-stat   NUTRITION DIAGNOSIS:   Inadequate oral intake related to inability to eat as evidenced by NPO status.  Ongoing  GOAL:   Patient will meet greater than or equal to 90% of their needs  Being addressed with TF  MONITOR:   Vent status, Labs, Skin, I & O's  ASSESSMENT:   47 yo male with PMH of drug overdose, DM, and renal insufficiency who was admitted on 12/8 with CAD, gastritis (from NSAIDS) and progressive respiratory failure.   Patient remains intubated on ventilator support MV: 11.5 L/min Temp (24hrs), Avg:97.8 F (36.6 C), Min:96.8 F (36 C), Max:98.6 F (37 C)  Propofol has been discontinued. Labs reviewed. Phosphorus 5.5 (H), BUN 106 (H), creatinine 3.65 (H) CBG's: [phone] Medications reviewed.  Tolerating TF without difficulty.    Minimal urine output r/t AKI. CRRT was stopped 12/16. May require IHD if UOP does not increase.  Weight down with volume removal / CRRT.  Treatment for ARDS continues; patient may require trach.   Diet Order:   Diet Order    None      EDUCATION NEEDS:   No education needs have been identified at this time  Skin:  Skin Assessment: Reviewed RN Assessment  Last BM:  12/17 (rectal tube)  Height:   Ht Readings from Last 1 Encounters:  02/10/18 5\' 8"  (1.727 m)    Weight:   Wt Readings from Last 1 Encounters:  02/18/18 72.6 kg    Ideal Body Weight:  70 kg  BMI:  Body mass index is 24.34 kg/m.  Estimated Nutritional Needs:   Kcal:  1830  Protein:  110-130 gm  Fluid:  1 L + UOP    Joaquin CourtsKimberly Harris, RD, LDN, CNSC Pager [phone] After Hours Pager [phone]

## 2018-02-19 ENCOUNTER — Inpatient Hospital Stay (HOSPITAL_COMMUNITY): Payer: BLUE CROSS/BLUE SHIELD

## 2018-02-19 LAB — GLUCOSE, CAPILLARY
Glucose-Capillary: 100 mg/dL — ABNORMAL HIGH (ref 70–99)
Glucose-Capillary: 121 mg/dL — ABNORMAL HIGH (ref 70–99)
Glucose-Capillary: 128 mg/dL — ABNORMAL HIGH (ref 70–99)
Glucose-Capillary: 134 mg/dL — ABNORMAL HIGH (ref 70–99)
Glucose-Capillary: 78 mg/dL (ref 70–99)
Glucose-Capillary: 82 mg/dL (ref 70–99)

## 2018-02-19 LAB — BLOOD GAS, ARTERIAL
Acid-base deficit: 7.7 mmol/L — ABNORMAL HIGH (ref 0.0–2.0)
Bicarbonate: 16 mmol/L — ABNORMAL LOW (ref 20.0–28.0)
Drawn by: 51155
FIO2: 30
O2 Saturation: 95.4 %
PEEP: 5 cmH2O
Patient temperature: 98.6
Pressure control: 25 cmH2O
RATE: 17 resp/min
pCO2 arterial: 25.8 mmHg — ABNORMAL LOW (ref 32.0–48.0)
pH, Arterial: 7.41 (ref 7.350–7.450)
pO2, Arterial: 78.3 mmHg — ABNORMAL LOW (ref 83.0–108.0)

## 2018-02-19 LAB — CBC
HCT: 22.3 % — ABNORMAL LOW (ref 39.0–52.0)
Hemoglobin: 7.5 g/dL — ABNORMAL LOW (ref 13.0–17.0)
MCH: 30.9 pg (ref 26.0–34.0)
MCHC: 33.6 g/dL (ref 30.0–36.0)
MCV: 91.8 fL (ref 80.0–100.0)
Platelets: 241 10*3/uL (ref 150–400)
RBC: 2.43 MIL/uL — ABNORMAL LOW (ref 4.22–5.81)
RDW: 15.9 % — ABNORMAL HIGH (ref 11.5–15.5)
WBC: 9.3 10*3/uL (ref 4.0–10.5)
nRBC: 0 % (ref 0.0–0.2)

## 2018-02-19 LAB — RENAL FUNCTION PANEL
Albumin: 1.5 g/dL — ABNORMAL LOW (ref 3.5–5.0)
Anion gap: 16 — ABNORMAL HIGH (ref 5–15)
BUN: 157 mg/dL — ABNORMAL HIGH (ref 6–20)
CO2: 16 mmol/L — ABNORMAL LOW (ref 22–32)
Calcium: 7.8 mg/dL — ABNORMAL LOW (ref 8.9–10.3)
Chloride: 105 mmol/L (ref 98–111)
Creatinine, Ser: 5.66 mg/dL — ABNORMAL HIGH (ref 0.61–1.24)
GFR calc Af Amer: 13 mL/min — ABNORMAL LOW (ref >60)
GFR calc non Af Amer: 11 mL/min — ABNORMAL LOW (ref >60)
Glucose, Bld: 140 mg/dL — ABNORMAL HIGH (ref 70–99)
Phosphorus: 7.5 mg/dL — ABNORMAL HIGH (ref 2.5–4.6)
Potassium: 3.9 mmol/L (ref 3.5–5.1)
Sodium: 137 mmol/L (ref 135–145)

## 2018-02-19 LAB — MAGNESIUM: Magnesium: 3 mg/dL — ABNORMAL HIGH (ref 1.7–2.4)

## 2018-02-19 IMAGING — DX DG CHEST 1V PORT
1 series · 1 of 1 positions shown · non-contrast
Comparison: [DATE]

CLINICAL DATA: ET tube

EXAM:
PORTABLE CHEST 1 VIEW

[chest ap]
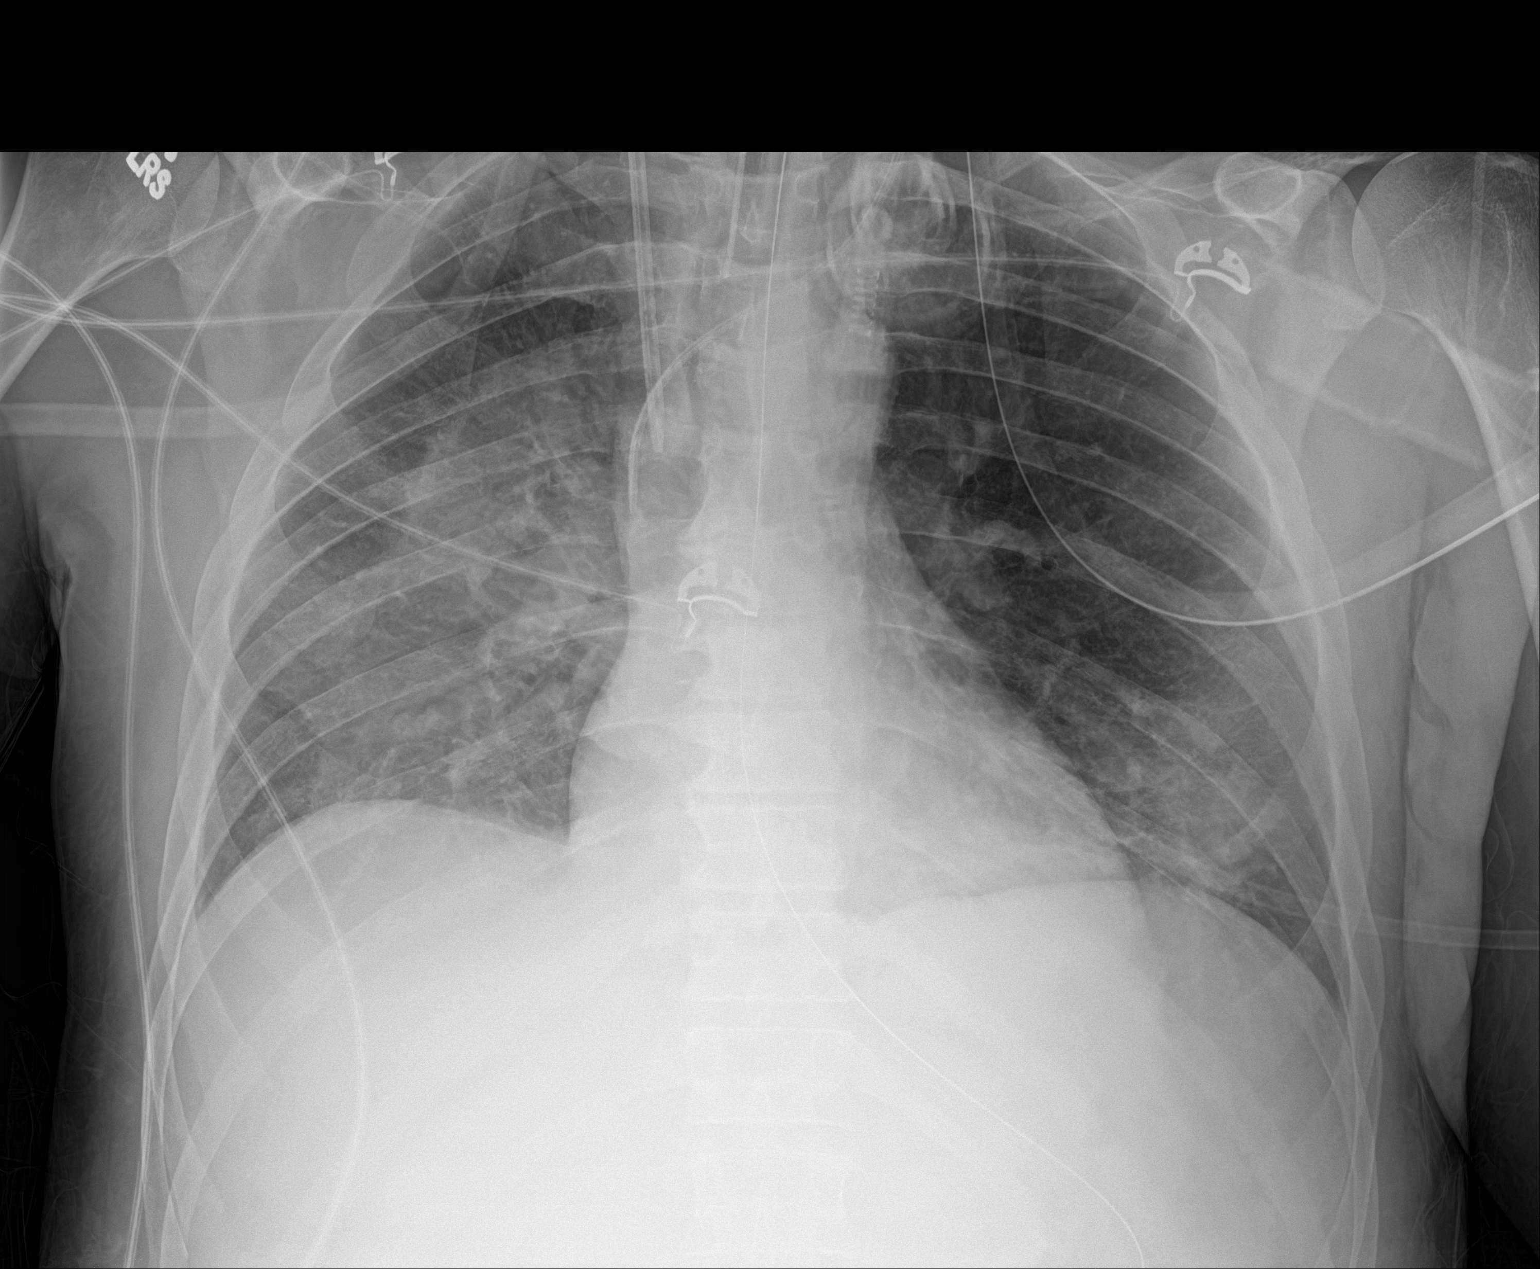

[1 of 1 positions shown; findings below may reference images not displayed]

FINDINGS: Support devices are stable. Patchy bilateral airspace disease again
noted, slightly improved. Heart is normal size. No visible
effusions.
IMPRESSION: Patchy bilateral airspace disease, right greater than left, slightly
improved since prior study.

## 2018-02-19 MED ORDER — HEPARIN SODIUM (PORCINE) 1000 UNIT/ML DIALYSIS
1000.0000 [IU] | INTRAMUSCULAR | Status: DC | PRN
  Administered 2018-02-19: 2400 [IU] via INTRAVENOUS_CENTRAL

## 2018-02-19 MED ORDER — SODIUM CHLORIDE 0.9 % IV SOLN: 100.0000 mL | INTRAVENOUS | Status: DC | PRN

## 2018-02-19 MED ORDER — BACITRACIN ZINC 500 UNIT/GM EX OINT
TOPICAL_OINTMENT | Freq: Two times a day (BID) | CUTANEOUS | Status: AC
  Administered 2018-02-19 – 2018-02-20 (×4): via TOPICAL
  Administered 2018-02-21: 1 via TOPICAL
  Administered 2018-02-21 – 2018-02-23 (×4): via TOPICAL
  Administered 2018-02-23: 1 via TOPICAL
  Administered 2018-02-24 (×2): via TOPICAL
  Administered 2018-02-25: 1 via TOPICAL
  Administered 2018-02-25 – 2018-02-26 (×3): via TOPICAL
  Administered 2018-02-27: 31.5556 via TOPICAL
  Administered 2018-02-27: 10:00:00 via TOPICAL
  Administered 2018-02-28 – 2018-03-04 (×10): 31.5556 via TOPICAL
  Filled 2018-02-19 (×2): qty 28.4

## 2018-02-19 MED ORDER — CHLORHEXIDINE GLUCONATE CLOTH 2 % EX PADS: 6.0000 | MEDICATED_PAD | Freq: Every day | CUTANEOUS | Status: DC

## 2018-02-19 NOTE — Care Management Note (Addendum)
Case Management Note  Patient Details  Name: Roberto Knapp MRN: [id number] Date of Birth: 02/27/1971  Subjective/Objective: Pt admitted with gastritis - lead to hypoxic resp failure                 Action/Plan:  PTA independent from home with wife.     Expected Discharge Date:                  Expected Discharge Plan:  (From home with wife)  In-House Referral:  Clinical Social Work  Discharge planning Services  CM Consult  Post Acute Care Choice:    Choice offered to:     DME Arranged:    DME Agency:     HH Arranged:    HH Agency:     Status of Service:  In process, will continue to follow  If discussed at Long Length of Stay Meetings, dates discussed:    Additional Comments: Wife provided insurance CM information Cherylann ParrClaxton, Maitlyn Penza S, RN 02/19/2018, 11:51 AM

## 2018-02-19 NOTE — Consult Note (Addendum)
WOC Nurse wound consult note Reason for Consult: MDRPI unstageable on penis from foley catheter Wound type:pressure Pressure Injury POA: No Measurement:1.8cm x 1cm x unknown depth Wound bed:100% black scab Drainage (amount, consistency, odor) none Periwound:reddened just around immediate border. Dressing procedure/placement/frequency: Pt has been in prone position on the vent to assist with respiratory issues. Blackened area noted around penis now that he is supine. Will order Bacitracin BID to keep wound moist. Nutritional level is less than required for wound healing, recommend Nutritional consult, please order if you agree.  WOC will follow weekly. If assistance needed before next visit, please re-consult. Barnett HatterMelinda Nneka Blanda, RN-C, WTA-C, [facility] Wound Treatment Associate Ostomy Care Associate

## 2018-02-19 NOTE — Progress Notes (Signed)
NAME:  Roberto Knapp, MRN:  [id number], DOB:  10-06-1970, LOS: 19 ADMISSION DATE:  02/09/2018, CONSULTATION DATE:  12/9 REFERRING MD:  Candiss Norse, CHIEF COMPLAINT:  Acute hypoxic respiratory failure    Brief History   45 yom admitted 12/8 w/ CAP, probable gastritis (from NSAIDS) and progressive hypoxic resp failure   History of present illness   47 year old male presented to ED 12/8 w/ cc: fever, body ache, abd pain, N/V, melena, BRBPR, cough and SOB. Initial onset about 10d prior. Initial symptoms were the fever, N/V/D,  body ache, cough shortness of breath for which he was taking advil about every 4 hours, the melena and BRBPR started a few days later. He reported the abd pain as epigastric in nature.  In ER: he was febrile, tachycardic,his RR was in 30s, wbc 18.9, CXR showed marked R>L airspace disease. CT abd/pelvis was neg. FOB pos, influenza PCR neg, UDS was positive for THC. He was admitted to IM service. Started on supplemental oxygen and abx. His respiratory status continued to worsen and by am 12/9 he was titrated up to 100 % on BIPAP w/ marked accessory use and RR in excess of 35 BPM. His HR on tele was in 160s in atrial fib. PCCM asked to see.   Past Medical History  smoker  Significant Hospital Events   12/8 admitted w/ working dx PNA, GIB (prb 2/2 NSAIDs) and acute resp failure 12/9 PCCM called. Resp status progressed. 100% BIPAP. R?L infiltrate on CXR 5/9 paralytics and prone positioning  Consults:  PCCM  Renal  Procedures:  OETT 12/9 >>  Art line 12/9 >>  L IJ HD catheter 12/9 >>   Significant Diagnostic Tests:  CT abdomen/pelvis 12/8 >> extensive bilateral basilar airspace disease, no acute findings in the abdomen or pelvis  Micro Data:  Influenza PCR 12/8: negative Strep antigen 12/8: negative Legionella antigen 12/8>>> positive Respiratory culture 12/9>>> BCX2 12/9>>> Urine 12/8 >> negative BAL 12/9 >> negative Pneumocystis DFA 12/9 >> negative cdiff 12/9:  negative GI Panel 12/9>>> negative  Antimicrobials:  Zosyn 12/9>>> 12/12 Vanc 12/8>>> 12/10 azith 12/9>>> 12/12 Levofloxacin 12/12 >>   Interim history/subjective:  No events overnight, no new complaints  Objective   Blood pressure (P) 116/78, pulse 87, temperature (!) 96.8 F (36 C), resp. rate 19, height _0  (1.727 m), weight 72.5 kg, SpO2 93 %.    Vent Mode: CPAP;PSV FiO2 (%):  [30 %-35 %] 35 % Set Rate:  [17 bmp] 17 bmp Vt Set:  [410 mL] 410 mL PEEP:  [5 cmH20] 5 cmH20 Pressure Support:  [5 cmH20] 5 cmH20 Plateau Pressure:  [20 cmH20-26 cmH20] 23 cmH20   Intake/Output Summary (Last 24 hours) at 02/19/2018 1042 Last data filed at 02/19/2018 1000 Gross per 24 hour  Intake 2445.33 ml  Output 119 ml  Net 2326.33 ml   Filed Weights   02/17/18 0400 02/18/18 0530 02/19/18 0400  Weight: 71.4 kg 72.6 kg 72.5 kg   Examination: General: Well appearing, NAD HENT: [place]/AT, PERRL, EOM-I and MMM Lungs: Diffuse crackles Cardiovascular: RRR, Nl S1/S2 and -M/R/G Abdomen: Soft, NT, ND and +BS Extremities: Flank edema decreased but noted Neuro: Arousable, moving all ext to command Skin: Intact  Resolved Hospital Problem list   Septic shock A Fib + RVR  Assessment & Plan:  Acute Hypoxic respiratory failure in setting of CAP vs aspiration (favor CAP, Legionella antigen positive) w/  ARDS.  No history of vaping in the last year per his wife  Plan Extubate today HD to remove fluid If reintubate then will trach Titrate O2 for sat of 88-92%  Shock, presumed septic shock, resolved Plan Abx as above, 10 days total to be stopped 12/22 D/C hydrocortisone  afib w/ RVR; currently has perfusing BP, heart rate controlled Plan Amio stopped 12/13 Anticoagulation deferred given his recent GI blood losses  Acute renal failure, oliguric Mixed anion gap metabolic and respiratory acidosis, improved Plan iHD at this point, per renal, would like additional volume negative Hold  lasix HD today Appreciate nephrology management BMET in AM Replace electrolyte as indicated  GIB: Suspect this 2/2 NSAIDS Plan Continue PPI Hold TF for extubation  Fluid and electrolyte imbalance: hyponatremia, hypokalemia  Plan Follow BMP Replace electrolytes as indicated HD per renal  Mild LFT elevation, stable to improved 12/12 Plan  Follow LFT 12/16  Best practice:  Diet: Started tube feeds 12/13 Pain/Anxiety/Delirium protocol (if indicated): 12/9 VAP protocol (if indicated): 12/9 DVT prophylaxis: [place] heparin  GI prophylaxis: PPI BID Glucose control: Has not been needed Mobility: BR Code Status: full code  Family Communication: Updated the patient's wife and brother at bedside 12/15 Disposition: ICU  Labs   CBC: Recent Labs  Lab 02/15/18 0357 02/16/18 0420 02/17/18 0350 02/18/18 0319 02/19/18 0325  WBC 18.5* 15.2* 14.1* 12.7* 9.3  HGB 7.9* 7.9* 8.6* 7.6* 7.5*  HCT 24.3* 23.7* 26.1* 23.3* 22.3*  MCV 91.0 91.5 91.6 92.8 91.8  PLT PLATELET CLUMPS NOTED ON SMEAR, UNABLE TO ESTIMATE PLATELET CLUMPS NOTED ON SMEAR, UNABLE TO ESTIMATE PLATELET CLUMPS NOTED ON SMEAR, UNABLE TO ESTIMATE PLATELET CLUMPS NOTED ON SMEAR, UNABLE TO ESTIMATE 062    Basic Metabolic Panel: Recent Labs  Lab 02/14/18 0320  02/15/18 0357  02/16/18 0420 02/16/18 1828 02/17/18 0350 02/18/18 0319 02/19/18 0325  NA 139   < > 138   < > 137 135 136 138 137  K 3.9   < > 3.6   < > 3.7 3.5 3.8 4.3 3.9  CL 102   < > 101   < > 101 100 101 104 105  CO2 25   < > 27   < > _0 20* 16*  GLUCOSE 129*   < > 129*   < > 125* 114* 110* 127* 140*  BUN 23*   < > 33*   < > 39* 43* 47* 106* 157*  CREATININE 2.10*   < > 1.81*   < > 1.65* 1.57* 1.60* 3.65* 5.66*  CALCIUM 7.2*   < > 7.4*   < > 7.6* 7.6* 7.9* 7.9* 7.8*  MG 2.5*  --  2.7*  --  2.6*  --  2.6*  --  3.0*  PHOS 3.5   < > 2.4*   < > 1.9* 1.5* 2.1* 5.5* 7.5*   < > = values in this interval not displayed.   GFR: Estimated Creatinine  Clearance: 15.3 mL/min (A) (by C-G formula based on SCr of 5.66 mg/dL (H)). Recent Labs  Lab 02/16/18 0420 02/17/18 0350 02/18/18 0319 02/19/18 0325  WBC 15.2* 14.1* 12.7* 9.3    Liver Function Tests: Recent Labs  Lab 02/13/18 0409  02/15/18 1632 02/16/18 0420 02/16/18 1828 02/17/18 0350 02/19/18 0325  AST 73*  --   --   --   --   --   --   ALT 29  --   --   --   --   --   --   ALKPHOS 97  --   --   --   --   --   --  BILITOT 0.9  --   --   --   --   --   --   PROT 4.7*  --   --   --   --   --   --   ALBUMIN 1.0*  1.0*   < > 1.4* 1.4* 1.6* 1.6* 1.5*   < > = values in this interval not displayed.   No results for input(s): LIPASE, AMYLASE in the last 168 hours. No results for input(s): AMMONIA in the last 168 hours.  ABG    Component Value Date/Time   PHART 7.410 02/19/2018 0320   PCO2ART 25.8 (L) 02/19/2018 0320   PO2ART 78.3 (L) 02/19/2018 0320   HCO3 16.0 (L) 02/19/2018 0320   TCO2 26 02/18/2018 0437   ACIDBASEDEF 7.7 (H) 02/19/2018 0320   O2SAT 95.4 02/19/2018 0320   Coagulation Profile: Recent Labs  Lab 02/13/18 0409  INR 1.07    Cardiac Enzymes: No results for input(s): CKTOTAL, CKMB, CKMBINDEX, TROPONINI in the last 168 hours.  HbA1C: No results found for: HGBA1C  CBG: Recent Labs  Lab 02/18/18 1630 02/18/18 1920 02/19/18 0000 02/19/18 0328 02/19/18 0732  GLUCAP 141* 119* 134* 128* 121*   The patient is critically ill with multiple organ systems failure and requires high complexity decision making for assessment and support, frequent evaluation and titration of therapies, application of advanced monitoring technologies and extensive interpretation of multiple databases.   Critical Care Time devoted to patient care services described in this note is  33  Minutes. This time reflects time of care of this signee Dr Jennet Maduro. This critical care time does not reflect procedure time, or teaching time or supervisory time of PA/NP/Med student/Med  Resident etc but could involve care discussion time.  Rush Farmer, M.D. [facility] Pulmonary/Critical Care Medicine. Pager: [phone]. After hours pager: [phone].

## 2018-02-19 NOTE — Procedures (Signed)
Extubation Procedure Note  Patient Details:   Name: Roberto Knapp DOB: 12-06-1970 MRN: [id number]   Airway Documentation:  Airway 7.5 mm (Active)  Secured at (cm) 24 cm 02/19/2018 12:02 PM  Measured From Lips 02/19/2018 12:02 PM  Secured Location Center 02/19/2018 12:02 PM  Secured By [employer] Tube Holder 02/19/2018 12:02 PM  Tube Holder Repositioned Yes 02/19/2018 12:02 PM  Cuff Pressure (cm H2O) 28 cm H2O 02/19/2018  9:10 AM  Site Condition Dry 02/19/2018  9:10 AM   Vent end date: (not recorded) Vent end time: (not recorded)   Evaluation  O2 sats: stable throughout Complications: No apparent complications Patient did tolerate procedure well. Bilateral Breath Sounds: Clear, Diminished   Yes  Morley KosJohnson, Taneesha Edgin Leroy 02/19/2018, 1:11 PM

## 2018-02-19 NOTE — Progress Notes (Signed)
25 mls versed and 50 mls Fentanyl wasted and witnessed with Milas HockKay Sharpe RN

## 2018-02-19 NOTE — Progress Notes (Signed)
Subjective:  Hemodynamically stable overnight- still minimal UOP - 71 cc recorded.  Labs worsening off of CRRT pretty drastically - due for IHD today.  Still on vent    Objective Vital signs in last 24 hours: Vitals:   02/19/18 0316 02/19/18 0400 02/19/18 0500 02/19/18 0600  BP:  125/77 121/76 130/79  Pulse: 61 61 (!) 58 60  Resp: _0 Temp:  97.9 F (36.6 C) (!) 97.5 F (36.4 C) (!) 97.5 F (36.4 C)  TempSrc:  Bladder    SpO2: 96% 96% 96% 97%  Weight:  72.5 kg    Height:       Weight change: -0.1 kg  Intake/Output Summary (Last 24 hours) at 02/19/2018 0647 Last data filed at 02/19/2018 0600 Gross per 24 hour  Intake 2052.39 ml  Output 121 ml  Net 1931.39 ml    Assessment/ Plan: Pt is a 47 y.o. yo male baseline crt 1.2 who was admitted on 02/09/2018 with febrile illness/PNA/resp failure developed AKI  Assessment/Plan: 1. Renal- AKI in the setting of above with likely ATN/recent hx of NSAIDS/IV contrast- started CRRT on 12/11- stopped at 11 AM on 12/16.  No UOP so no signs of recovery yet.   IHD today- appears pretty catabolic so likely will need IHD daily to stay out of trouble  2. PNA - per CCM- VDRF- WBC decreasing-  3. Anemia- hgb dropping in house, felt to have GIB possibly gastritis-  Have added ESA- no iron due to recent sepsis - transfuse PRN   4. HTN/volume- overloaded, tolerated fairly aggressive UF with CRRT- will challenge with IHD   Louis Meckel    Labs: Basic Metabolic Panel: Recent Labs  Lab 02/17/18 0350 02/18/18 0319 02/19/18 0325  NA 136 138 137  K 3.8 4.3 3.9  CL 101 104 105  CO2 24 20* 16*  GLUCOSE 110* 127* 140*  BUN 47* 106* 157*  CREATININE 1.60* 3.65* 5.66*  CALCIUM 7.9* 7.9* 7.8*  PHOS 2.1* 5.5* 7.5*   Liver Function Tests: Recent Labs  Lab 02/13/18 0409  02/16/18 1828 02/17/18 0350 02/19/18 0325  AST 73*  --   --   --   --   ALT 29  --   --   --   --   ALKPHOS 97  --   --   --   --   BILITOT 0.9  --   --   --    --   PROT 4.7*  --   --   --   --   ALBUMIN 1.0*  1.0*   < > 1.6* 1.6* 1.5*   < > = values in this interval not displayed.   No results for input(s): LIPASE, AMYLASE in the last 168 hours. No results for input(s): AMMONIA in the last 168 hours. CBC: Recent Labs  Lab 02/15/18 0357 02/16/18 0420 02/17/18 0350 02/18/18 0319 02/19/18 0325  WBC 18.5* 15.2* 14.1* 12.7* 9.3  HGB 7.9* 7.9* 8.6* 7.6* 7.5*  HCT 24.3* 23.7* 26.1* 23.3* 22.3*  MCV 91.0 91.5 91.6 92.8 91.8  PLT PLATELET CLUMPS NOTED ON SMEAR, UNABLE TO ESTIMATE PLATELET CLUMPS NOTED ON SMEAR, UNABLE TO ESTIMATE PLATELET CLUMPS NOTED ON SMEAR, UNABLE TO ESTIMATE PLATELET CLUMPS NOTED ON SMEAR, UNABLE TO ESTIMATE 241   Cardiac Enzymes: No results for input(s): CKTOTAL, CKMB, CKMBINDEX, TROPONINI in the last 168 hours. CBG: Recent Labs  Lab 02/18/18 1235 02/18/18 1630 02/18/18 1920 02/19/18 0000 02/19/18 0328  GLUCAP 133* 141* 119*  134* 128*    Iron Studies: No results for input(s): IRON, TIBC, TRANSFERRIN, FERRITIN in the last 72 hours. Studies/Results: Dg Chest Port 1 View  Result Date: 02/18/2018 CLINICAL DATA:  Endotracheal tube EXAM: PORTABLE CHEST 1 VIEW COMPARISON:  02/17/2018 FINDINGS: Endotracheal tube in good position. Right jugular central venous catheter tip in the SVC. Left jugular central venous catheter tip in the SVC. No pneumothorax. NG tube in the stomach. Asymmetric airspace disease right greater than left unchanged. No significant effusion. IMPRESSION: Bilateral airspace disease right greater than left is stable. Support lines remain in good position. Overall no change from yesterday. Electronically Signed   By: Franchot Gallo M.D.   On: 02/18/2018 07:32   Medications: Infusions: . sodium chloride 10 mL/hr at 02/19/18 0600  . dexmedetomidine (PRECEDEX) IV infusion 0.8 mcg/kg/hr (02/19/18 0600)  . feeding supplement (VITAL AF 1.2 CAL) 65 mL/hr at 02/18/18 1800  . fentaNYL infusion INTRAVENOUS 200  mcg/hr (02/19/18 0600)  . levofloxacin (LEVAQUIN) IV    . midazolam (VERSED) infusion Stopped (02/18/18 1429)    Scheduled Medications: . chlorhexidine gluconate (MEDLINE KIT)  15 mL Mouth Rinse BID  . Chlorhexidine Gluconate Cloth  6 each Topical Daily  . Chlorhexidine Gluconate Cloth  6 each Topical Q0600  . darbepoetin (ARANESP) injection - NON-DIALYSIS  100 mcg Subcutaneous Q Tue-1800  . Influenza vac split quadrivalent PF  0.5 mL Intramuscular Tomorrow-1000  . mouth rinse  15 mL Mouth Rinse 10 times per day  . pantoprazole (PROTONIX) IV  40 mg Intravenous Q12H  . sodium chloride flush  10-40 mL Intracatheter Q12H    have reviewed scheduled and prn medications.  Physical Exam: General: sedated, intubated, opens eyes, no commands Heart:  RRR Lungs: BCS bilat Abdomen: soft, non tender Extremities: pitting edema to dep areas Dialysis Access: right IJ vascath placed 12/11    02/19/2018,6:47 AM  LOS: 10 days

## 2018-02-20 DIAGNOSIS — L8990 Pressure ulcer of unspecified site, unspecified stage: Secondary | ICD-10-CM

## 2018-02-20 LAB — GLUCOSE, CAPILLARY
Glucose-Capillary: 69 mg/dL — ABNORMAL LOW (ref 70–99)
Glucose-Capillary: 89 mg/dL (ref 70–99)
Glucose-Capillary: 77 mg/dL (ref 70–99)
Glucose-Capillary: 79 mg/dL (ref 70–99)
Glucose-Capillary: 65 mg/dL — ABNORMAL LOW (ref 70–99)
Glucose-Capillary: 76 mg/dL (ref 70–99)

## 2018-02-20 LAB — BASIC METABOLIC PANEL
Anion gap: 14 (ref 5–15)
BUN: 33 mg/dL — ABNORMAL HIGH (ref 6–20)
CO2: 24 mmol/L (ref 22–32)
Calcium: 7.6 mg/dL — ABNORMAL LOW (ref 8.9–10.3)
Chloride: 98 mmol/L (ref 98–111)
Creatinine, Ser: 2.7 mg/dL — ABNORMAL HIGH (ref 0.61–1.24)
GFR calc Af Amer: 31 mL/min — ABNORMAL LOW (ref >60)
GFR calc non Af Amer: 26 mL/min — ABNORMAL LOW (ref >60)
Glucose, Bld: 85 mg/dL (ref 70–99)
Potassium: 3.9 mmol/L (ref 3.5–5.1)
Sodium: 136 mmol/L (ref 135–145)

## 2018-02-20 LAB — RENAL FUNCTION PANEL
Albumin: 1.6 g/dL — ABNORMAL LOW (ref 3.5–5.0)
Anion gap: 14 (ref 5–15)
BUN: 85 mg/dL — ABNORMAL HIGH (ref 6–20)
CO2: 22 mmol/L (ref 22–32)
Calcium: 7.7 mg/dL — ABNORMAL LOW (ref 8.9–10.3)
Chloride: 102 mmol/L (ref 98–111)
Creatinine, Ser: 4.43 mg/dL — ABNORMAL HIGH (ref 0.61–1.24)
GFR calc Af Amer: 17 mL/min — ABNORMAL LOW (ref >60)
GFR calc non Af Amer: 15 mL/min — ABNORMAL LOW (ref >60)
Glucose, Bld: 99 mg/dL (ref 70–99)
Phosphorus: 5.5 mg/dL — ABNORMAL HIGH (ref 2.5–4.6)
Potassium: 3.1 mmol/L — ABNORMAL LOW (ref 3.5–5.1)
Sodium: 138 mmol/L (ref 135–145)

## 2018-02-20 LAB — CBC
HCT: 23.3 % — ABNORMAL LOW (ref 39.0–52.0)
Hemoglobin: 7.6 g/dL — ABNORMAL LOW (ref 13.0–17.0)
MCH: 30 pg (ref 26.0–34.0)
MCHC: 32.6 g/dL (ref 30.0–36.0)
MCV: 92.1 fL (ref 80.0–100.0)
Platelets: UNDETERMINED 10*3/uL (ref 150–400)
RBC: 2.53 MIL/uL — ABNORMAL LOW (ref 4.22–5.81)
RDW: 15.9 % — ABNORMAL HIGH (ref 11.5–15.5)
WBC: 10.2 10*3/uL (ref 4.0–10.5)
nRBC: 0 % (ref 0.0–0.2)

## 2018-02-20 LAB — HEPATITIS B SURFACE ANTIGEN: Hepatitis B Surface Ag: NEGATIVE

## 2018-02-20 LAB — HEPATITIS B SURFACE ANTIBODY,QUALITATIVE: Hep B S Ab: NONREACTIVE

## 2018-02-20 LAB — HEPATITIS B CORE ANTIBODY, TOTAL: Hep B Core Total Ab: NEGATIVE

## 2018-02-20 MED ORDER — FENTANYL CITRATE (PF) 100 MCG/2ML IJ SOLN
25.0000 ug | INTRAMUSCULAR | Status: DC | PRN
  Administered 2018-02-20: 75 ug via INTRAVENOUS
  Administered 2018-02-20: 50 ug via INTRAVENOUS
  Filled 2018-02-20 (×2): qty 2

## 2018-02-20 MED ORDER — LORAZEPAM 2 MG/ML IJ SOLN
1.0000 mg | Freq: Once | INTRAMUSCULAR | Status: AC
  Administered 2018-02-21: 1 mg via INTRAVENOUS
  Filled 2018-02-20: qty 1

## 2018-02-20 MED ORDER — DEXTROSE 50 % IV SOLN
12.5000 g | INTRAVENOUS | Status: AC
  Administered 2018-02-20: 12.5 g via INTRAVENOUS

## 2018-02-20 MED ORDER — HEPARIN SODIUM (PORCINE) 1000 UNIT/ML DIALYSIS: 2400.0000 [IU] | INTRAMUSCULAR | Status: DC | PRN

## 2018-02-20 MED ORDER — HEPARIN SODIUM (PORCINE) 1000 UNIT/ML IJ SOLN
INTRAMUSCULAR | Status: AC
  Filled 2018-02-20: qty 3

## 2018-02-20 MED ORDER — FENTANYL CITRATE (PF) 100 MCG/2ML IJ SOLN
INTRAMUSCULAR | Status: AC
  Administered 2018-02-20: 50 ug
  Filled 2018-02-20: qty 2

## 2018-02-20 MED ORDER — DEXTROSE 50 % IV SOLN
INTRAVENOUS | Status: AC
  Administered 2018-02-20: 12.5 g via INTRAVENOUS
  Filled 2018-02-20: qty 50

## 2018-02-20 NOTE — Progress Notes (Signed)
Subjective:  HD yesterday - removed 3 liters and tolerated well.  Extubated after HD - 110 of urine   Objective Vital signs in last 24 hours: Vitals:   02/20/18 0400 02/20/18 0441 02/20/18 0500 02/20/18 0600  BP: 126/83  133/77 128/79  Pulse: 79  82 81  Resp: '17  17 19  '$ Temp: (!) 97.2 F (36.2 C)  97.9 F (36.6 C) (!) 94.6 F (34.8 C)  TempSrc:      SpO2: 98%  100% 98%  Weight:  65 kg    Height:       Weight change: -7.5 kg  Intake/Output Summary (Last 24 hours) at 02/20/2018 0636 Last data filed at 02/20/2018 0600 Gross per 24 hour  Intake 778.43 ml  Output 4163 ml  Net -3384.57 ml    Assessment/ Plan: Pt is a 47 y.o. yo male baseline crt 1.2 who was admitted on 02/09/2018 with febrile illness/PNA/resp failure developed AKI  Assessment/Plan: 1. Renal- AKI in the setting of above with likely ATN/recent hx of NSAIDS/IV contrast- started CRRT on 12/11- stopped at 11 AM on 12/16.  min UOP so no signs of recovery yet.   IHD 12/18 and to do again today 78/58 as is catabolic- then follow UOP and labs  2. PNA - per CCM- VDRF- WBC decreasing-  3. Anemia- hgb dropping in house, felt to have GIB possibly gastritis-  Have added ESA- no iron due to recent sepsis - transfuse PRN - ho heparin with HD  4. HTN/volume- overloaded, tolerated fairly aggressive UF with CRRT and IHD- much better-  cont to challenge gently with IHD however with care not to drop BP   Roberto Knapp    Labs: Basic Metabolic Panel: Recent Labs  Lab 02/18/18 0319 02/19/18 0325 02/20/18 0435  NA 138 137 138  K 4.3 3.9 3.1*  CL 104 105 102  CO2 20* 16* 22  GLUCOSE 127* 140* 99  BUN 106* 157* 85*  CREATININE 3.65* 5.66* 4.43*  CALCIUM 7.9* 7.8* 7.7*  PHOS 5.5* 7.5* 5.5*   Liver Function Tests: Recent Labs  Lab 02/17/18 0350 02/19/18 0325 02/20/18 0435  ALBUMIN 1.6* 1.5* 1.6*   No results for input(s): LIPASE, AMYLASE in the last 168 hours. No results for input(s): AMMONIA in the last 168  hours. CBC: Recent Labs  Lab 02/16/18 0420 02/17/18 0350 02/18/18 0319 02/19/18 0325 02/20/18 0435  WBC 15.2* 14.1* 12.7* 9.3 10.2  HGB 7.9* 8.6* 7.6* 7.5* 7.6*  HCT 23.7* 26.1* 23.3* 22.3* 23.3*  MCV 91.5 91.6 92.8 91.8 92.1  PLT PLATELET CLUMPS NOTED ON SMEAR, UNABLE TO ESTIMATE PLATELET CLUMPS NOTED ON SMEAR, UNABLE TO ESTIMATE PLATELET CLUMPS NOTED ON SMEAR, UNABLE TO ESTIMATE 241 PLATELET CLUMPS NOTED ON SMEAR, UNABLE TO ESTIMATE   Cardiac Enzymes: No results for input(s): CKTOTAL, CKMB, CKMBINDEX, TROPONINI in the last 168 hours. CBG: Recent Labs  Lab 02/19/18 1134 02/19/18 1923 02/19/18 2318 02/20/18 0358 02/20/18 0439  GLUCAP 100* 78 82 69* 89    Iron Studies: No results for input(s): IRON, TIBC, TRANSFERRIN, FERRITIN in the last 72 hours. Studies/Results: Dg Chest Port 1 View  Result Date: 02/19/2018 CLINICAL DATA:  ET tube EXAM: PORTABLE CHEST 1 VIEW COMPARISON:  02/18/2018 FINDINGS: Support devices are stable. Patchy bilateral airspace disease again noted, slightly improved. Heart is normal size. No visible effusions. IMPRESSION: Patchy bilateral airspace disease, right greater than left, slightly improved since prior study. Electronically Signed   By: Rolm Baptise M.D.   On: 02/19/2018  07:39   Medications: Infusions: . sodium chloride 10 mL/hr at 02/20/18 0600  . sodium chloride    . sodium chloride    . dexmedetomidine (PRECEDEX) IV infusion Stopped (02/19/18 1300)  . feeding supplement (VITAL AF 1.2 CAL) 65 mL/hr at 02/18/18 1800  . fentaNYL infusion INTRAVENOUS Stopped (02/19/18 1205)  . levofloxacin (LEVAQUIN) IV Stopped (02/19/18 1323)  . midazolam (VERSED) infusion Stopped (02/18/18 1429)    Scheduled Medications: . bacitracin   Topical BID  . chlorhexidine gluconate (MEDLINE KIT)  15 mL Mouth Rinse BID  . Chlorhexidine Gluconate Cloth  6 each Topical Daily  . darbepoetin (ARANESP) injection - NON-DIALYSIS  100 mcg Subcutaneous Q Tue-1800  .  Influenza vac split quadrivalent PF  0.5 mL Intramuscular Tomorrow-1000  . mouth rinse  15 mL Mouth Rinse 10 times per day  . pantoprazole (PROTONIX) IV  40 mg Intravenous Q12H  . sodium chloride flush  10-40 mL Intracatheter Q12H    have reviewed scheduled and prn medications.  Physical Exam: General: alert, staring but does nod responses Heart:  RRR Lungs: BCS bilat Abdomen: soft, non tender Extremities: pitting edema to dep areas- although much better than previous  Dialysis Access: right IJ vascath placed 12/11    02/20/2018,6:36 AM  LOS: 11 days

## 2018-02-20 NOTE — Progress Notes (Signed)
NAME:  Roberto Knapp, MRN:  [id number], DOB:  [birth year], LOS: 42 ADMISSION DATE:  02/09/2018, CONSULTATION DATE:  12/9 REFERRING MD:  Candiss Norse, CHIEF COMPLAINT:  Acute hypoxic respiratory failure    Brief History   13 yom admitted 12/8 w/ CAP, legionella infection, probable gastritis (from NSAIDS) and progressive hypoxic resp failure. Developed severe ARDS requiring paralysis and proning  Past Medical History  smoker  Significant Hospital Events   12/8 admitted w/ working dx PNA, GIB (prb 2/2 NSAIDs) and acute resp failure 12/9 PCCM called. Resp status progressed. 100% BIPAP. R?L infiltrate on CXR 12/9 Intubated paralytics and prone positioning 12/18 Extubated  Consults:  PCCM  Renal  Procedures:  OETT 12/9 >> 12/18 Art line 12/9 >>  Lt IJ CVL 12/9 >>  Rt IJ HD cath 12/11  Significant Diagnostic Tests:  CT abdomen/pelvis 12/8 >> extensive bilateral basilar airspace disease, no acute findings in the abdomen or pelvis  Micro Data:  Influenza PCR 12/8: negative Strep antigen 12/8: negative Legionella antigen 12/8>>> positive Respiratory culture 12/9>>> BCX2 12/9>>> Urine 12/8 >> negative BAL 12/9 >> negative Pneumocystis DFA 12/9 >> negative cdiff 12/9: negative GI Panel 12/9>>> negative  Antimicrobials:  Zosyn 12/9>>> 12/12 Vanc 12/8>>> 12/10 azith 12/9>>> 12/12 Levofloxacin 12/12 >>   Interim history/subjective:  Extubated, no new complaints.  Objective   Blood pressure 136/82, pulse 75, temperature 97.7 F (36.5 C), temperature source Bladder, resp. rate 20, height _0  (1.727 m), weight 65 kg, SpO2 98 %.    Vent Mode: PCV FiO2 (%):  [35 %] 35 % Set Rate:  [17 bmp] 17 bmp PEEP:  [5 cmH20] 5 cmH20   Intake/Output Summary (Last 24 hours) at 02/20/2018 1006 Last data filed at 02/20/2018 0800 Gross per 24 hour  Intake 372.52 ml  Output 4178 ml  Net -3805.48 ml   Filed Weights   02/18/18 0530 02/19/18 0400 02/20/18 0441  Weight: 72.6 kg 72.5 kg 65 kg    Examination: Gen:      Chronically ill-appearing. HEENT:  EOMI, sclera anicteric Neck:     No masses; no thyromegaly Lungs:    Bilateral crackles CV:         Regular rate and rhythm; no murmurs Abd:      + bowel sounds; soft, non-tender; no palpable masses, no distension Ext:    No edema; adequate peripheral perfusion Skin:      Warm and dry; no rash Neuro: Awake, follows simple commands.  Resolved Hospital Problem list   Septic shock A Fib + RVR  Assessment & Plan:  Acute Hypoxic respiratory failure in setting of CAP vs aspiration (favor CAP, Legionella antigen positive) w/  ARDS.  No history of vaping in the last year per his wife Plan Stable post extubation Wean down oxygen Follow respiratory status.  Shock, presumed septic shock, resolved Plan Abx as above, 10 days total to be stopped 12/22 Off stress dose steroids.  afib w/ RVR; currently has perfusing BP, heart rate controlled Plan Amio stopped 12/13 Anticoagulation deferred given his recent GI blood losses  Acute renal failure, oliguric Mixed anion gap metabolic and respiratory acidosis, improved Plan Intermittent HD per renal. K replacement on dialysis.  GIB: Suspect this 2/2 NSAIDS Plan Continue PPI Keep n.p.o.  Will need swallow eval.   Mild LFT elevation, stable to improved 12/12 Plan  Follow LFT  Best practice:  Diet: Started tube feeds 12/13 Pain/Anxiety/Delirium protocol (if indicated): 12/9 VAP protocol (if indicated): 12/9 DVT prophylaxis: [place] heparin  GI prophylaxis: PPI BID Glucose control: Has not been needed Mobility: BR Code Status: full code  Family Communication: Updated the patient's wife and brother at bedside 12/15.  No family at bedside 12/9. Disposition: ICU  The patient is critically ill with multiple organ system failure and requires high complexity decision making for assessment and support, frequent evaluation and titration of therapies, advanced monitoring, review of  radiographic studies and interpretation of complex data.   Critical Care Time devoted to patient care services, exclusive of separately billable procedures, described in this note is 35 minutes.   Marshell Garfinkel MD [facility] Pager [phone] If no answer or after 3pm call: [phone] 02/20/2018, 10:41 AM

## 2018-02-20 NOTE — Progress Notes (Signed)
eLink Physician-Brief Progress Note Patient Name: Roberto BussingGary W Knapp DOB: 07-04-1970 MRN: [id number]   Date of Service  02/20/2018  HPI/Events of Note  Pt requesting for something to help him sleep.  Pt had received Versed in the past.   eICU Interventions  Pt is NPO.  Ativan IV ordered.     Intervention Category Minor Interventions: Other:  Larinda ButteryVanessa Craige Patel 02/20/2018, 11:57 PM

## 2018-02-20 NOTE — Progress Notes (Signed)
eLink Physician-Brief Progress Note Patient Name: Roberto BussingGary W Brocious DOB: 07-19-1970 MRN: [id number]   Date of Service  02/20/2018  HPI/Events of Note  Pt complaining of cramping.  Pt was recently started on hemodialysis.   eICU Interventions  Fentanyl prn ordered. Check BMP.     Intervention Category Minor Interventions: Other:  Larinda ButteryVanessa Chryl Holten 02/20/2018, 9:03 PM

## 2018-02-20 NOTE — Progress Notes (Signed)
Unable to perform 1200 assessment as pt is off unit receiving dialysis.

## 2018-02-21 ENCOUNTER — Inpatient Hospital Stay (HOSPITAL_COMMUNITY): Payer: BLUE CROSS/BLUE SHIELD

## 2018-02-21 LAB — RENAL FUNCTION PANEL
Albumin: 1.7 g/dL — ABNORMAL LOW (ref 3.5–5.0)
Anion gap: 14 (ref 5–15)
BUN: 44 mg/dL — ABNORMAL HIGH (ref 6–20)
CO2: 24 mmol/L (ref 22–32)
Calcium: 7.8 mg/dL — ABNORMAL LOW (ref 8.9–10.3)
Chloride: 99 mmol/L (ref 98–111)
Creatinine, Ser: 3.95 mg/dL — ABNORMAL HIGH (ref 0.61–1.24)
GFR calc Af Amer: 19 mL/min — ABNORMAL LOW (ref >60)
GFR calc non Af Amer: 17 mL/min — ABNORMAL LOW (ref >60)
Glucose, Bld: 87 mg/dL (ref 70–99)
Phosphorus: 5.5 mg/dL — ABNORMAL HIGH (ref 2.5–4.6)
Potassium: 4 mmol/L (ref 3.5–5.1)
Sodium: 137 mmol/L (ref 135–145)

## 2018-02-21 LAB — CBC
HCT: 22.9 % — ABNORMAL LOW (ref 39.0–52.0)
Hemoglobin: 7.5 g/dL — ABNORMAL LOW (ref 13.0–17.0)
MCH: 30.7 pg (ref 26.0–34.0)
MCHC: 32.8 g/dL (ref 30.0–36.0)
MCV: 93.9 fL (ref 80.0–100.0)
Platelets: 244 10*3/uL (ref 150–400)
RBC: 2.44 MIL/uL — ABNORMAL LOW (ref 4.22–5.81)
RDW: 16.2 % — ABNORMAL HIGH (ref 11.5–15.5)
WBC: 8.9 10*3/uL (ref 4.0–10.5)
nRBC: 0 % (ref 0.0–0.2)

## 2018-02-21 LAB — GLUCOSE, CAPILLARY
Glucose-Capillary: 114 mg/dL — ABNORMAL HIGH (ref 70–99)
Glucose-Capillary: 69 mg/dL — ABNORMAL LOW (ref 70–99)
Glucose-Capillary: 72 mg/dL (ref 70–99)
Glucose-Capillary: 78 mg/dL (ref 70–99)
Glucose-Capillary: 82 mg/dL (ref 70–99)
Glucose-Capillary: 89 mg/dL (ref 70–99)
Glucose-Capillary: 97 mg/dL (ref 70–99)

## 2018-02-21 LAB — CK: Total CK: 38 U/L — ABNORMAL LOW (ref 49–397)

## 2018-02-21 LAB — MAGNESIUM: Magnesium: 2.1 mg/dL (ref 1.7–2.4)

## 2018-02-21 IMAGING — DX DG CHEST 1V PORT
1 series · 1 of 1 positions shown · non-contrast
Comparison: [DATE]

CLINICAL DATA: Acute respiratory failure

EXAM:
PORTABLE CHEST 1 VIEW

[chest]
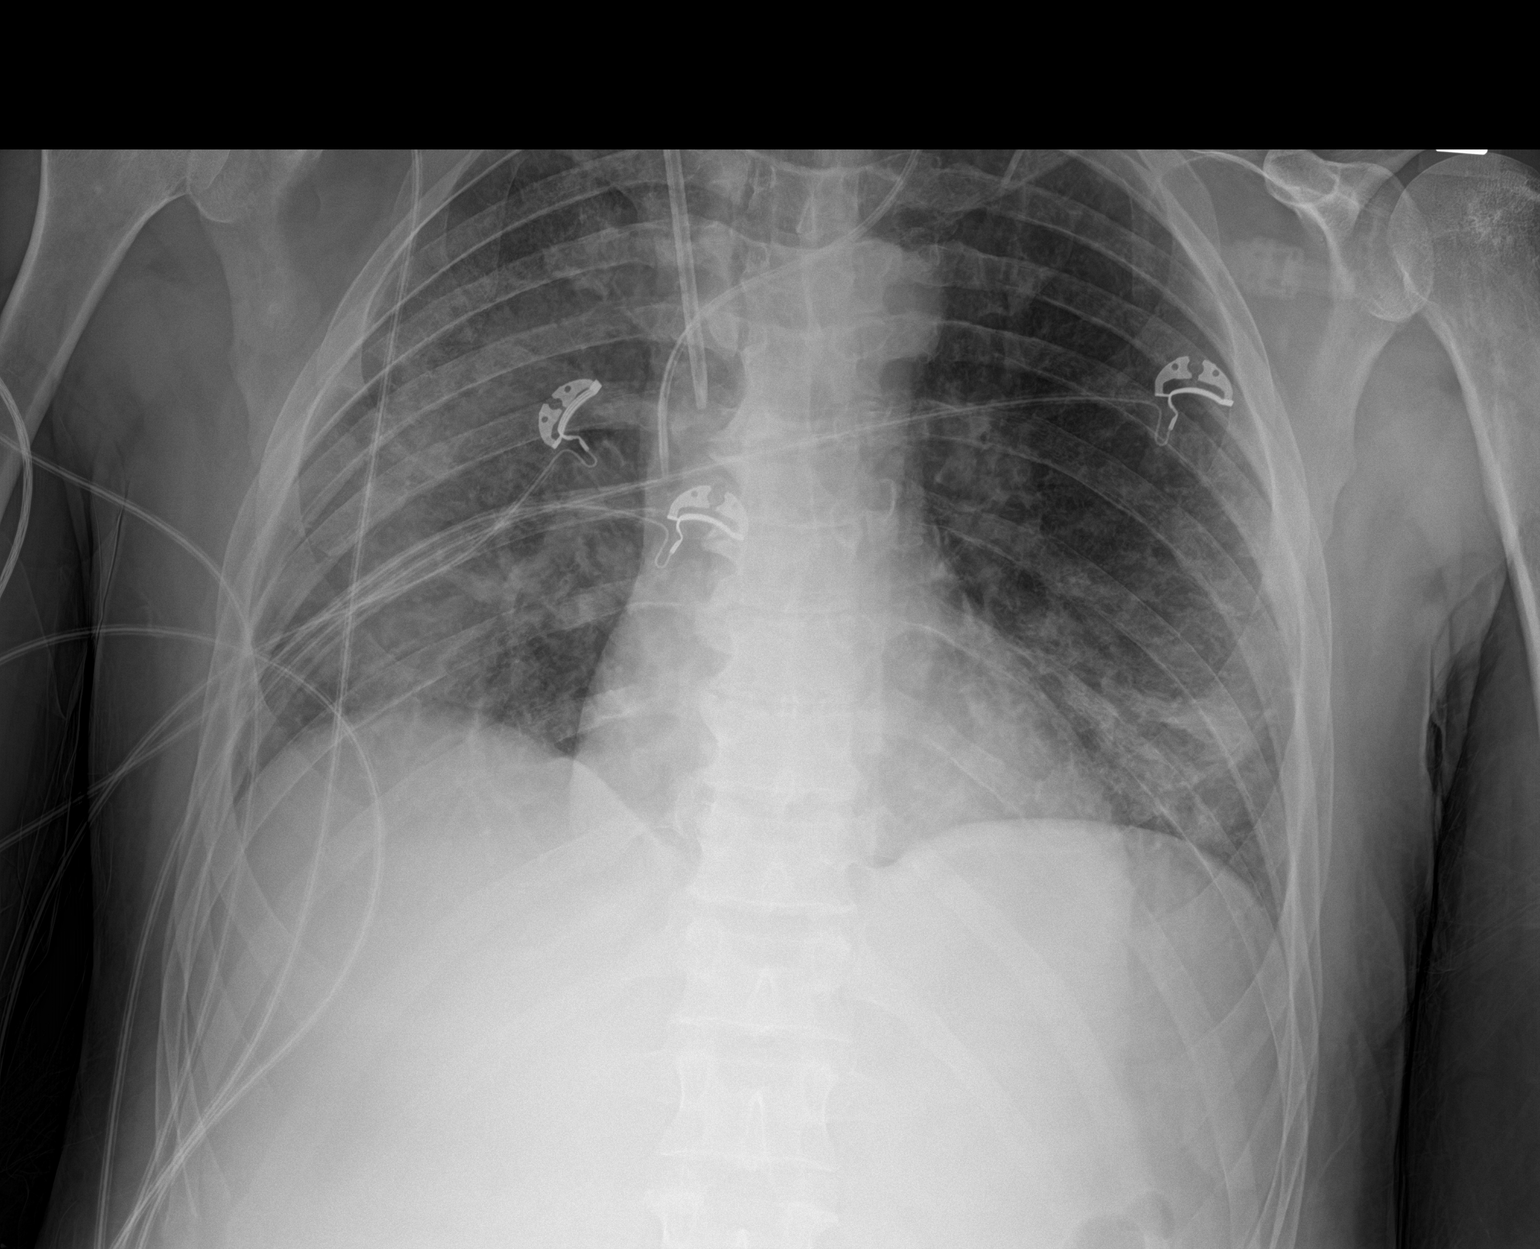

[1 of 1 positions shown; findings below may reference images not displayed]

FINDINGS: Interval extubation and removal of NG tube. Right Vas-Cath, left
central line remain in place, unchanged. Diffuse right lung airspace
disease and patchy left lower lobe opacities slightly increased
since prior study. Heart is upper limits normal in size. No visible
effusions.
IMPRESSION: Diffuse right lung airspace disease and left lower lobe airspace
opacity, slightly worsened since prior study.

## 2018-02-21 MED ORDER — IBUPROFEN 200 MG PO TABS: 200.0000 mg | ORAL_TABLET | ORAL | Status: DC | PRN

## 2018-02-21 MED ORDER — DEXTROSE-NACL 5-0.45 % IV SOLN
INTRAVENOUS | Status: DC
  Administered 2018-02-21: 16:00:00 via INTRAVENOUS

## 2018-02-21 MED ORDER — NEPRO/CARBSTEADY PO LIQD
1000.0000 mL | ORAL | Status: DC
  Administered 2018-02-21 – 2018-02-26 (×6): 1000 mL via ORAL
  Filled 2018-02-21 (×17): qty 1000

## 2018-02-21 MED ORDER — OXYCODONE-ACETAMINOPHEN 5-325 MG PO TABS
1.0000 | ORAL_TABLET | ORAL | Status: DC | PRN
  Administered 2018-02-21 – 2018-02-22 (×3): 1 via ORAL
  Filled 2018-02-21 (×3): qty 1

## 2018-02-21 MED ORDER — ZOLPIDEM TARTRATE 5 MG PO TABS
10.0000 mg | ORAL_TABLET | Freq: Every evening | ORAL | Status: DC | PRN
  Administered 2018-02-21: 10 mg via ORAL
  Filled 2018-02-21: qty 2

## 2018-02-21 MED ORDER — HEPARIN SODIUM (PORCINE) 5000 UNIT/ML IJ SOLN
5000.0000 [IU] | Freq: Three times a day (TID) | INTRAMUSCULAR | Status: DC
  Administered 2018-02-21 – 2018-03-06 (×37): 5000 [IU] via SUBCUTANEOUS
  Filled 2018-02-21 (×39): qty 1

## 2018-02-21 NOTE — Progress Notes (Signed)
Nutrition Follow-up  DOCUMENTATION CODES:   Not applicable  INTERVENTION:   When Cortrak is placed, begin TF:  Nepro at 30 ml/h   Increase by 10 ml/h every 4 hours to goal rate of 50 ml/h  Provides 2160 kcal, 97 gm protein, 872 ml free water daily  NUTRITION DIAGNOSIS:   Inadequate oral intake related to inability to eat as evidenced by NPO status.  Ongoing  GOAL:   Patient will meet greater than or equal to 90% of their needs  Unmet  MONITOR:   Vent status, Labs, Skin, I & O's  ASSESSMENT:   47 yo male with PMH of drug overdose, DM, and renal insufficiency who was admitted on 12/8 with CAD, gastritis (from NSAIDS) and progressive respiratory failure.   Extubated 12/18. Received iHD 12/18 and 12/19. Volume status improving, but remains volume overloaded. Nephrology following for iHD. TF off since extubation. Remains NPO per SLP recommendation. Needs MBS prior to initiating PO's as he is at high risk for aspiration. Cortrak tube being placed today.  Labs reviewed. Phosphorus 5.5 (H) Medications reviewed.   Diet Order:   Diet Order            Diet NPO time specified  Diet effective now              EDUCATION NEEDS:   No education needs have been identified at this time  Skin:  Skin Assessment: Skin Integrity Issues: Skin Integrity Issues:: DTI DTI: penis  Last BM:  12/20 (rectal tube)  Height:   Ht Readings from Last 1 Encounters:  02/10/18 5\' 8"  (1.727 m)    Weight:   Wt Readings from Last 1 Encounters:  02/21/18 67.7 kg    Ideal Body Weight:  70 kg  BMI:  Body mass index is 22.69 kg/m.  Estimated Nutritional Needs:   Kcal:  2000-2200  Protein:  90-110 gm  Fluid:  1 L + UOP    Joaquin CourtsKimberly Matia Zelada, RD, LDN, CNSC Pager [phone] After Hours Pager [phone]

## 2018-02-21 NOTE — Progress Notes (Signed)
Subjective:  HD yesterday - removed 1.5 liters and tolerated well.   215 of urine.  Pain in legs but cant be specific about location of character, if in muscle or joint - not sleeping "have not seen his doctor in 2 days"  Objective Vital signs in last 24 hours: Vitals:   02/21/18 0400 02/21/18 0500 02/21/18 0600 02/21/18 0700  BP: 123/82 126/84 126/84 132/82  Pulse: 99 93 97 90  Resp: (!) 34     Temp: 99.5 F (37.5 C) 99.5 F (37.5 C) 99 F (37.2 C) 98.6 F (37 C)  TempSrc: Bladder     SpO2: 95% 96% 96% 98%  Weight:  67.7 kg    Height:       Weight change: 2 kg  Intake/Output Summary (Last 24 hours) at 02/21/2018 0750 Last data filed at 02/21/2018 0700 Gross per 24 hour  Intake 182.99 ml  Output 1890 ml  Net -1707.01 ml    Assessment/ Plan: Pt is a 47 y.o. yo male baseline crt 1.2 who was admitted on 02/09/2018 with febrile illness/PNA/resp failure developed AKI  Assessment/Plan: 1. Renal- AKI in the setting of above with likely ATN/recent hx of NSAIDS/IV contrast-  CRRT from 12/11-  12/16.  min UOP so no signs of recovery yet.   IHD 12/18 and 12/19 as is catabolic- now follow UOP and labs  2. PNA - per CCM- VDRF- WBC decreasing-  3. Anemia- hgb dropping in house, felt to have GIB possibly gastritis-  Have added ESA- no iron due to recent sepsis - transfuse PRN - ho heparin with HD- fairly stable in the 7's  4. HTN/volume- overloaded, tolerated fairly aggressive UF with CRRT and IHD- much better-  cont to challenge gently with IHD as able however with care not to drop BP   Cecille AverKellie A Render Marley    Labs: Basic Metabolic Panel: Recent Labs  Lab 02/19/18 0325 02/20/18 0435 02/20/18 2116 02/21/18 0500  NA 137 138 136 137  K 3.9 3.1* 3.9 4.0  CL 105 102 98 99  CO2 16* 22 24 24   GLUCOSE 140* 99 85 87  BUN 157* 85* 33* 44*  CREATININE 5.66* 4.43* 2.70* 3.95*  CALCIUM 7.8* 7.7* 7.6* 7.8*  PHOS 7.5* 5.5*  --  5.5*   Liver Function Tests: Recent Labs  Lab  02/19/18 0325 02/20/18 0435 02/21/18 0500  ALBUMIN 1.5* 1.6* 1.7*   No results for input(s): LIPASE, AMYLASE in the last 168 hours. No results for input(s): AMMONIA in the last 168 hours. CBC: Recent Labs  Lab 02/17/18 0350 02/18/18 0319 02/19/18 0325 02/20/18 0435 02/21/18 0512  WBC 14.1* 12.7* 9.3 10.2 8.9  HGB 8.6* 7.6* 7.5* 7.6* 7.5*  HCT 26.1* 23.3* 22.3* 23.3* 22.9*  MCV 91.6 92.8 91.8 92.1 93.9  PLT PLATELET CLUMPS NOTED ON SMEAR, UNABLE TO ESTIMATE PLATELET CLUMPS NOTED ON SMEAR, UNABLE TO ESTIMATE 241 PLATELET CLUMPS NOTED ON SMEAR, UNABLE TO ESTIMATE 244   Cardiac Enzymes: No results for input(s): CKTOTAL, CKMB, CKMBINDEX, TROPONINI in the last 168 hours. CBG: Recent Labs  Lab 02/20/18 1155 02/20/18 1714 02/20/18 1949 02/20/18 2358 02/21/18 0325  GLUCAP 79 65* 76 82 78    Iron Studies: No results for input(s): IRON, TIBC, TRANSFERRIN, FERRITIN in the last 72 hours. Studies/Results: Dg Chest Port 1 View  Result Date: 02/21/2018 CLINICAL DATA:  Acute respiratory failure EXAM: PORTABLE CHEST 1 VIEW COMPARISON:  02/19/2017 FINDINGS: Interval extubation and removal of NG tube. Right Vas-Cath, left central line remain in  place, unchanged. Diffuse right lung airspace disease and patchy left lower lobe opacities slightly increased since prior study. Heart is upper limits normal in size. No visible effusions. IMPRESSION: Diffuse right lung airspace disease and left lower lobe airspace opacity, slightly worsened since prior study. Electronically Signed   By: Charlett NoseKevin  Dover M.D.   On: 02/21/2018 07:06   Medications: Infusions: . sodium chloride 10 mL/hr at 02/21/18 0700  . dexmedetomidine (PRECEDEX) IV infusion Stopped (02/19/18 1300)  . feeding supplement (VITAL AF 1.2 CAL) 65 mL/hr at 02/20/18 2300  . fentaNYL infusion INTRAVENOUS Stopped (02/19/18 1205)  . levofloxacin (LEVAQUIN) IV Stopped (02/19/18 1323)  . midazolam (VERSED) infusion Stopped (02/18/18 1429)     Scheduled Medications: . bacitracin   Topical BID  . Chlorhexidine Gluconate Cloth  6 each Topical Daily  . darbepoetin (ARANESP) injection - NON-DIALYSIS  100 mcg Subcutaneous Q Tue-1800  . Influenza vac split quadrivalent PF  0.5 mL Intramuscular Tomorrow-1000  . pantoprazole (PROTONIX) IV  40 mg Intravenous Q12H  . sodium chloride flush  10-40 mL Intracatheter Q12H    have reviewed scheduled and prn medications.  Physical Exam: General: alert, staring but does nod responses- a little more engaged but not much  Heart:  RRR Lungs: BCS bilat Abdomen: soft, non tender Extremities: pitting edema to dep areas- although much better than previous  Dialysis Access: right IJ vascath placed 12/11    02/21/2018,7:50 AM  LOS: 12 days

## 2018-02-21 NOTE — Procedures (Signed)
Cortrak  Person Inserting Tube:  Roberto BanePitts, [name], RD Tube Type:  Cortrak - 43 inches Tube Location:  Right nare Initial Placement:  Stomach Secured by: Bridle Technique Used to Measure Tube Placement:  Documented cm marking at nare/ corner of mouth Cortrak Secured At:  68 cm    Cortrak Tube Team Note:  Consult received to place a Cortrak feeding tube.   No x-ray is required. RN may begin using tube.    If the tube becomes dislodged please keep the tube and contact the Cortrak team at [email or web address] ([email or web address]) for replacement.  If after hours and replacement cannot be delayed, place a NG tube and confirm placement with an abdominal x-ray.   Roberto BaneHeather Dericka Ostenson RD, LDN, CNSC [phone] Pager [phone] After Hours Pager

## 2018-02-21 NOTE — Evaluation (Signed)
Clinical/Bedside Swallow Evaluation Patient Details  Name: Roberto Knapp MRN: [id number] Date of Birth: May 24, 1970  Today's Date: 02/21/2018 Time: SLP Start Time (ACUTE ONLY): 1150 SLP Stop Time (ACUTE ONLY): 1230 SLP Time Calculation (min) (ACUTE ONLY): 40 min  Past Medical History:  Past Medical History:  Diagnosis Date  . Drug overdose, intentional [facility](HCC)    Past Surgical History:  Past Surgical History:  Procedure Laterality Date  . FASCIOTOMY  09/08/2011   Procedure: FASCIOTOMY;  Surgeon: Sherren Kernsharles E Fields, MD;  [facility];  Service: Vascular;  Laterality: Left;  . FEMORAL-POPLITEAL BYPASS GRAFT  09/08/2011   Procedure: BYPASS GRAFT FEMORAL-POPLITEAL ARTERY;  Surgeon: Sherren Kernsharles E Fields, MD;  [facility];  Service: Vascular;  Laterality: Left;  . NO PAST SURGERIES     HPI:  [id number] yom admitted 02/09/18 w/ CAP, legionella infection, probable gastritis (from NSAIDS) and progressive hypoxic resp failure. Developed severe ARDS requiring paralysis and proning. PMH: intentional overdose. CXR = Diffuse right lung airspace disease and left lower lobe airspace opacity, worsened since last CXR   Assessment / Plan / Recommendation Clinical Impression  Pt's wife reports just finished oral care recently. Oral motor examination reveals profound weakness with minimal labial and lingual movement. Pt accepted trials of ice chips, thin liquid, and puree. Poor lip closure noted across consistencies, however, no anterior leakage observed. Pt exhibited difficulty with lip closure with straw, cup, and spoon use. No overt s/s aspiration observed on any consistency, however, pt is at significantly increased risk for dysphagia/aspiration due to 11 day intubation and profound general weakness. Objective study is recommended prior to initiating po diet. Pt's wife indicates she does not feel he can currently tolerate MBS, and pt indicates he does not prefer FEES. At this time, NPO status is recommended, with  consideration of cortrak to safely provide nutrition and hydration until strength improves and instrumental assessment can be completed. SLP will follow up to determine readiness for instrumental assessment and po intake.    SLP Visit Diagnosis: Dysphagia, unspecified (R13.10)    Aspiration Risk  Severe aspiration risk;Risk for inadequate nutrition/hydration    Diet Recommendation NPO  Medication Administration: Via alternative means    Other  Recommendations Oral Care Recommendations: Oral care prior to ice chip/H20   Follow up Recommendations (TBD)      Frequency and Duration min 2x/week  2 weeks       Prognosis Prognosis for Safe Diet Advancement: Good      Swallow Study   General Date of Onset: 02/09/18 HPI: [id number] yom admitted 02/09/18 w/ CAP, legionella infection, probable gastritis (from NSAIDS) and progressive hypoxic resp failure. Developed severe ARDS requiring paralysis and proning. PMH: intentional overdose. CXR = Diffuse right lung airspace disease and left lower lobe airspace opacity, worsened since last CXR Type of Study: Bedside Swallow Evaluation Previous Swallow Assessment: none Diet Prior to this Study: (clear liquid) Temperature Spikes Noted: No Respiratory Status: Nasal cannula History of Recent Intubation: Yes Length of Intubations (days): 11 days Date extubated: 02/19/18 Behavior/Cognition: Alert;Cooperative Oral Cavity Assessment: Within Functional Limits Oral Care Completed by SLP: Recent completion by staff Oral Cavity - Dentition: Adequate natural dentition Vision: Functional for self-feeding Self-Feeding Abilities: Total assist Patient Positioning: Upright in bed Baseline Vocal Quality: Low vocal intensity Volitional Cough: (unable to produce) Volitional Swallow: Unable to elicit    Oral/Motor/Sensory Function Overall Oral Motor/Sensory Function: Generalized oral weakness(significantly weak, minimal labial/lingual movement)   Ice Chips Ice  chips: Impaired Oral Phase Impairments: Reduced labial  seal Pharyngeal Phase Impairments: Suspected delayed Swallow;Decreased hyoid-laryngeal movement   Thin Liquid Thin Liquid: Impaired Oral Phase Impairments: Reduced labial seal Pharyngeal  Phase Impairments: Suspected delayed Swallow;Decreased hyoid-laryngeal movement    Nectar Thick Nectar Thick Liquid: Not tested   Honey Thick Honey Thick Liquid: Not tested   Puree Puree: Impaired Presentation: Spoon Pharyngeal Phase Impairments: Suspected delayed Swallow;Decreased hyoid-laryngeal movement   Solid     Solid: Not tested     Celia B. Murvin NatalBueche, [facility], TheMSP, CCC-SLP Speech Language Pathologist [phone]  Leigh AuroraBueche, Celia Brown 02/21/2018,12:41 PM

## 2018-02-21 NOTE — Progress Notes (Addendum)
Pt's Nepro tube feed rate increased from 5830ml/hr to 4240ml/hr as per MD order. Pt tolerating without difficulty, denies feeling overly "full" or having any gastric discomfort at this time.

## 2018-02-21 NOTE — Progress Notes (Addendum)
NAME:  Roberto Knapp, MRN:  [id number], DOB:  Mar 11, 1970, LOS: 12 ADMISSION DATE:  02/09/2018, CONSULTATION DATE:  12/9 REFERRING MD:  Candiss Norse, CHIEF COMPLAINT:  Acute hypoxic respiratory failure    Brief History   39 yom admitted 12/8 w/ CAP, legionella infection, probable gastritis (from NSAIDS) and progressive hypoxic resp failure. Developed severe ARDS requiring paralysis and proning  Past Medical History  [place] [facility] Events   12/8 admitted w/ working dx PNA, GIB (prb 2/2 NSAIDs) and acute resp failure 12/9 PCCM called. Resp status progressed. 100% BIPAP. R?L infiltrate on CXR 12/9 Intubated paralytics and prone positioning 12/18 Extubated  Consults:  PCCM  Renal  Procedures:  OETT 12/9 >> 12/18 Art line 12/9 >> 12/17 Lt IJ CVL 12/9 >>  Rt IJ HD cath 12/11  Significant Diagnostic Tests:  CT abdomen/pelvis 12/8 >> extensive bilateral basilar airspace disease, no acute findings in the abdomen or pelvis  Micro Data:  Influenza PCR 12/8: negative Strep antigen 12/8: negative Legionella antigen 12/8>>> positive Respiratory culture 12/9>>> BCX2 12/9>>> Urine 12/8 >> negative BAL 12/9 >> negative Pneumocystis DFA 12/9 >> negative cdiff 12/9: negative GI Panel 12/9>>> negative  Antimicrobials:  Zosyn 12/9>>> 12/12 Vanc 12/8>>> 12/10 azith 12/9>>> 12/12 Levofloxacin 12/12 >>   Interim history/subjective:  Complains of pain in the legs.  No dyspnea, cough, sputum production Wants something to drink.  Objective   Blood pressure 127/79, pulse 93, temperature 99.1 F (37.3 C), resp. rate (!) 29, height _0  (1.727 m), weight 67.7 kg, SpO2 96 %.        Intake/Output Summary (Last 24 hours) at 02/21/2018 1023 Last data filed at 02/21/2018 1000 Gross per 24 hour  Intake 183 ml  Output 2000 ml  Net -1817 ml   Filed Weights   02/20/18 1240 02/20/18 1650 02/21/18 0500  Weight: 67 kg 65.5 kg 67.7 kg   Examination: Blood pressure 127/79, pulse 93,  temperature 99.1 F (37.3 C), resp. rate (!) 29, height _1  (1.727 m), weight 67.7 kg, SpO2 96 %. Gen:      No acute distress chronically ill-appearing HEENT:  EOMI, sclera anicteric Neck:     No masses; no thyromegaly Lungs:    Scattered crackles CV:         Regular rate and rhythm; no murmurs Abd:      + bowel sounds; soft, non-tender; no palpable masses, no distension Ext:    No edema; adequate peripheral perfusion Skin:      Warm and dry; no rash Neuro: Awake, follows simple commands.  Resolved Hospital Problem list   Septic shock A Fib + RVR  Assessment & Plan:  Acute Hypoxic respiratory failure in setting of CAP vs aspiration (favor CAP, Legionella antigen positive) w/  ARDS.  No history of vaping in the last year per his wife Plan Stable post extubation Wean down oxygen Follow respiratory status and CXR  Shock, presumed septic shock, resolved Plan Abx as above, 10 days total to be stopped 12/22 Off stress dose steroids.  afib w/ RVR; currently has perfusing BP, heart rate controlled Plan Amio stopped 12/13 Anticoagulation deferred given his recent GI blood losses  Acute renal failure, oliguric Mixed anion gap metabolic and respiratory acidosis, improved Plan Intermittent HD per renal. K replacement on dialysis.  GIB: Suspect this 2/2 NSAIDS Plan Continue PPI Keep n.p.o.  Will need swallow eval.  Mild LFT elevation, stable to improved 12/12 Plan  Follow LFTs  Leg pain Check CK Percocet  for pain control  Remove central line. Keep HD cath for now per nephrology Transfer from ICU Will need speech and PT to see.   Best practice:  Diet: NPO. Pain/Anxiety/Delirium protocol (if indicated): NA VAP protocol (if indicated): NA DVT prophylaxis: [place] heparin, SCDs GI prophylaxis: PPI BID Glucose control: Has not been needed Mobility: BR Code Status: full code  Family Communication: Updated patient and wife in detail at bedside 12/20. Disposition: Transfer  to PCU and back to [facility].   Marshell Garfinkel MD [facility] Pager [phone] If no answer or after 3pm call: [phone] 02/21/2018, 10:23 AM

## 2018-02-22 ENCOUNTER — Inpatient Hospital Stay (HOSPITAL_COMMUNITY): Payer: BLUE CROSS/BLUE SHIELD

## 2018-02-22 LAB — CBC
HCT: 23.4 % — ABNORMAL LOW (ref 39.0–52.0)
Hemoglobin: 7.4 g/dL — ABNORMAL LOW (ref 13.0–17.0)
MCH: 29.6 pg (ref 26.0–34.0)
MCHC: 31.6 g/dL (ref 30.0–36.0)
MCV: 93.6 fL (ref 80.0–100.0)
Platelets: 209 10*3/uL (ref 150–400)
RBC: 2.5 MIL/uL — ABNORMAL LOW (ref 4.22–5.81)
RDW: 16.2 % — ABNORMAL HIGH (ref 11.5–15.5)
WBC: 9 10*3/uL (ref 4.0–10.5)
nRBC: 0 % (ref 0.0–0.2)

## 2018-02-22 LAB — COMPREHENSIVE METABOLIC PANEL
ALT: 61 U/L — ABNORMAL HIGH (ref 0–44)
AST: 33 U/L (ref 15–41)
Albumin: 1.6 g/dL — ABNORMAL LOW (ref 3.5–5.0)
Alkaline Phosphatase: 117 U/L (ref 38–126)
Anion gap: 14 (ref 5–15)
BUN: 75 mg/dL — ABNORMAL HIGH (ref 6–20)
CO2: 24 mmol/L (ref 22–32)
Calcium: 8 mg/dL — ABNORMAL LOW (ref 8.9–10.3)
Chloride: 98 mmol/L (ref 98–111)
Creatinine, Ser: 6.3 mg/dL — ABNORMAL HIGH (ref 0.61–1.24)
GFR calc Af Amer: 11 mL/min — ABNORMAL LOW (ref >60)
GFR calc non Af Amer: 10 mL/min — ABNORMAL LOW (ref >60)
Glucose, Bld: 116 mg/dL — ABNORMAL HIGH (ref 70–99)
Potassium: 3.9 mmol/L (ref 3.5–5.1)
Sodium: 136 mmol/L (ref 135–145)
Total Bilirubin: 0.4 mg/dL (ref 0.3–1.2)
Total Protein: 5.7 g/dL — ABNORMAL LOW (ref 6.5–8.1)

## 2018-02-22 LAB — GLUCOSE, CAPILLARY
Glucose-Capillary: 111 mg/dL — ABNORMAL HIGH (ref 70–99)
Glucose-Capillary: 119 mg/dL — ABNORMAL HIGH (ref 70–99)
Glucose-Capillary: 117 mg/dL — ABNORMAL HIGH (ref 70–99)
Glucose-Capillary: 112 mg/dL — ABNORMAL HIGH (ref 70–99)
Glucose-Capillary: 108 mg/dL — ABNORMAL HIGH (ref 70–99)

## 2018-02-22 LAB — MAGNESIUM: Magnesium: 2.2 mg/dL (ref 1.7–2.4)

## 2018-02-22 LAB — PHOSPHORUS: Phosphorus: 6.9 mg/dL — ABNORMAL HIGH (ref 2.5–4.6)

## 2018-02-22 IMAGING — DX DG CHEST 1V PORT
1 series · 1 of 1 positions shown · non-contrast
Comparison: [DATE]

CLINICAL DATA: Acute respiratory failure.

EXAM:
PORTABLE CHEST 1 VIEW

[chest]
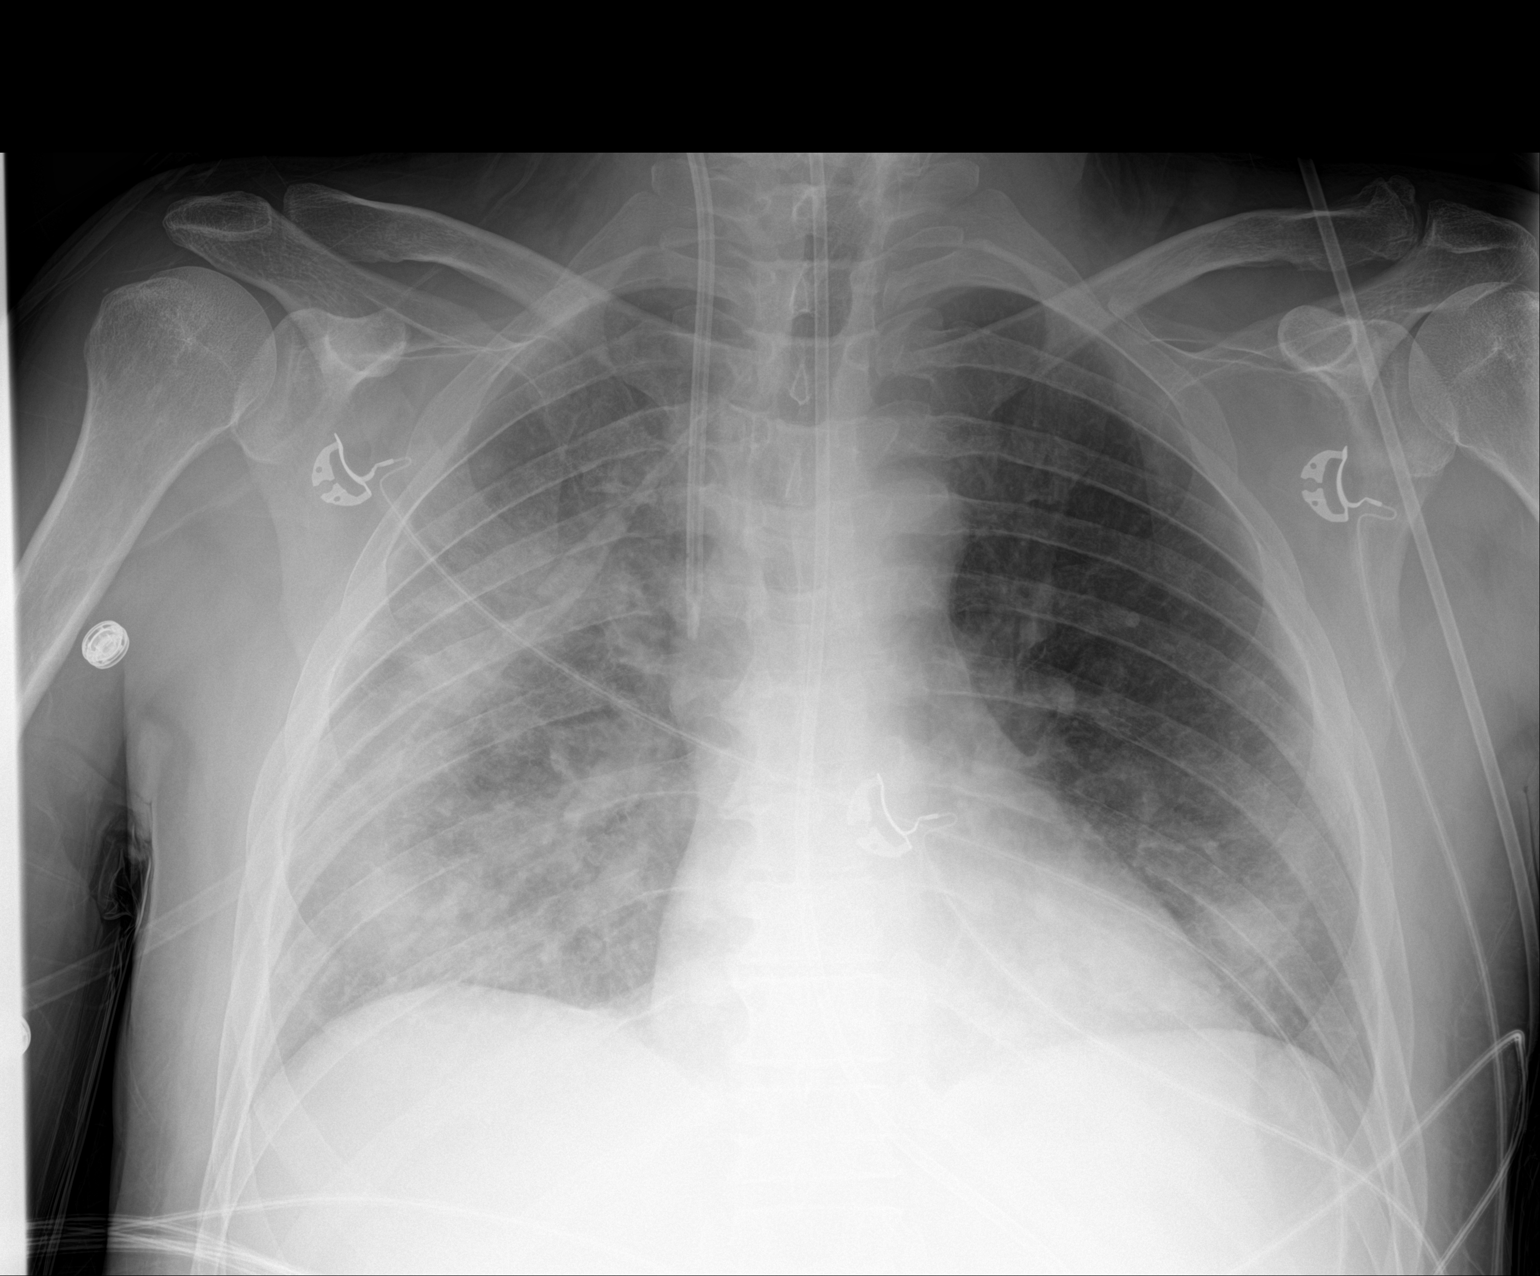

[1 of 1 positions shown; findings below may reference images not displayed]

FINDINGS: Right IJ central venous catheter unchanged with tip over the SVC.
Enteric tube courses into the region of the stomach and off the film
as tip is not visualized. Lungs are adequately inflated demonstrate
worsening patchy bilateral airspace process right worse than left
likely multifocal infection. No effusion. Cardiomediastinal
silhouette and remainder of the exam is unchanged.
IMPRESSION: Slight worsening bilateral patchy airspace process right worse than
left likely multifocal infection.

Tubes and lines as described.

## 2018-02-22 MED ORDER — TRAZODONE HCL 50 MG PO TABS
50.0000 mg | ORAL_TABLET | Freq: Every evening | ORAL | Status: DC | PRN
  Administered 2018-02-22 – 2018-03-04 (×7): 50 mg via ORAL
  Filled 2018-02-22 (×11): qty 1

## 2018-02-22 MED ORDER — OXYCODONE HCL 5 MG/5ML PO SOLN
5.0000 mg | Freq: Four times a day (QID) | ORAL | Status: DC | PRN
  Administered 2018-02-24 – 2018-02-25 (×3): 5 mg
  Filled 2018-02-22 (×3): qty 5

## 2018-02-22 MED ORDER — LOPERAMIDE HCL 1 MG/7.5ML PO SUSP
1.0000 mg | Freq: Once | ORAL | Status: AC
  Administered 2018-02-22: 1 mg via ORAL
  Filled 2018-02-22 (×3): qty 7.5

## 2018-02-22 MED ORDER — CHLORHEXIDINE GLUCONATE CLOTH 2 % EX PADS
6.0000 | MEDICATED_PAD | Freq: Every day | CUTANEOUS | Status: DC
  Administered 2018-02-22 – 2018-02-25 (×4): 6 via TOPICAL

## 2018-02-22 MED ORDER — ACETAMINOPHEN 160 MG/5ML PO SOLN
325.0000 mg | Freq: Four times a day (QID) | ORAL | Status: DC | PRN
  Administered 2018-02-23 – 2018-02-25 (×2): 325 mg
  Filled 2018-02-22 (×2): qty 20.3

## 2018-02-22 MED ORDER — ALPRAZOLAM 0.25 MG PO TABS
0.2500 mg | ORAL_TABLET | Freq: Once | ORAL | Status: AC
  Administered 2018-02-26: 0.25 mg via ORAL
  Filled 2018-02-22: qty 1

## 2018-02-22 NOTE — Progress Notes (Signed)
Pt's Nepro tube feed rate increased from 1340ml/hr to 1550ml/hr (goal) at this time. Pt tolerating without difficulty.

## 2018-02-22 NOTE — Progress Notes (Signed)
[facility] Progress Note  Patient: Roberto Knapp ZOX:[id number]   PCP: [name], MD DOB: 20-May-1970   DOA: 02/09/2018   DOS: 02/22/2018   Date of Service: the patient was seen and examined on 02/22/2018  Brief hospital course: Pt. With no significant PMH; admitted on 02/09/2018, presented with complaint of Fever, aches, SOB, abdominal pain, N/V, melena, BRBPR, was found to have CAP, legionella infection, probable gastritis (from NSAIDS) and progressive hypoxic resp failure. Developed severe ARDS requiring paralysis and proning. Currently further plan is continue Antibiotics, monitor renal function, and oral intake.  Subjective: Reports inability to sleep, complains about having bilateral cramping pain.  Concern regarding having rectal tube.  No nausea no vomiting no fever no chills.  Assessment and Plan: 1.  Acute hypoxic respiratory failure Community-acquired pneumonia secondary to Legionella ARDS Sepsis with septic shock currently resolved, present on admission. Acute toxic and metabolic encephalopathy. Presents with cough and shortness of breath.  Initially admitted to the floor later on transferred to ICU requiring intubation. Patient developed ARDS. Hypoxia was so severe that patient required proning Currently on room air. Initially treated with IV ceftriaxone azithromycin followed by IV Zosyn and vancomycin followed by IV Levaquin. Leukocytosis is normalized. We will complete course of antibiotic recommended by PCCM. So far cultures are negative. Only positive findings is Legionella. Required IV pressors in the ICU currently resolved.  Patient was also on IV stress dose steroids in the ICU.  Currently stop.  2.  Acute kidney injury. Mixed anion gap and non-anion gap metabolic and respiratory acidosis. Mixture of prerenal etiology, ATN and hemodynamic disturbances. Nephrology was consulted. Patient required CRRT in the ICU. Currently requiring intermittent HD. Has  temporary dialysis catheter in right IJ. Urine output is slowly improving. Monitor.  3.  Multifactorial acute metabolic and toxic encephalopathy. Likely hospital/ICU induced delirium, uremia, hypoxic injury, medications induced. Also appears to have critical illness weakness. Alert awake and oriented x3 right now but not following commands consistently. Monitor for now. Speech therapy consulted for cognitive evaluation along with swallowing evaluation.  4.  Dysphagia. Speech therapy consult for swallowing. Currently n.p.o. Core track inserted for nutrition. Continue Nepro.  5. Anemia,  multifactorial secondary to acute blood loss secondary to GI bleeding, critical illness with sepsis and renal disease Continue to monitor H&H. Transfuse for hemoglobin less than 7. Continue PPI twice a day. Transition to per tube tomorrow. Since the patient does not appear to have any active GI bleeding currently GI was not consulted.  6.  A. fib with RVR. Suspecting A. fib was paroxysmal currently sinus rhythm. Patient was on amiodarone for rate control. Anticoagulation was deferred secondary to GI bleed. Suspect that the patient will likely not need anticoagulation going forward if he does not have any further episodes of A. fib.  7.  LFT elevation. Currently stable.  8.  Bilateral leg pain. CK normal. Etiology not clear. Currently no pain. Continue Percocet.  9.  Diarrhea. Likely secondary to tube feeding. Also secondary to antibiotics use. No abdominal pain, abdominal distention, nausea vomiting, leukocytosis. Does not appear to be C. difficile. We will tries Imodium and monitor. Continue rectal tube.  10.  Left upper extremity pain. Mild edema noted. We will check ultrasound to rule out DVT.  Pressure Injury 02/18/18 Deep Tissue Injury - Purple or maroon localized area of discolored intact skin or blood-filled blister due to damage of underlying soft tissue from pressure and/or  shear. Deep tissue injury superiorly and inferiorly to urinary me (Active)  02/18/18 2000  Location: Penis  Location Orientation: Mid;Upper;Lower  Staging: Deep Tissue Injury - Purple or maroon localized area of discolored intact skin or blood-filled blister due to damage of underlying soft tissue from pressure and/or shear.  Wound Description (Comments): Deep tissue injury superiorly and inferiorly to urinary meatus  Present on Admission: No     Diet: tube feed DVT Prophylaxis: mechanical compression device  Advance goals of care discussion: full code  Family Communication: family was present at bedside, at the time of interview. The pt provided permission to discuss medical plan with the family. Opportunity was given to ask question and all questions were answered satisfactorily.   Disposition:  Discharge to be determined.  Consultants: Nephrology, PCCM Procedures:  OETT 12/9 >> 12/18 Art line 12/9 >> 12/17 Lt IJ CVL 12/9 >>  Rt IJ HD cath 12/11  Scheduled Meds: . bacitracin   Topical BID  . Chlorhexidine Gluconate Cloth  6 each Topical Q0600  . darbepoetin (ARANESP) injection - NON-DIALYSIS  100 mcg Subcutaneous Q Tue-1800  . heparin injection (subcutaneous)  5,000 Units Subcutaneous Q8H  . Influenza vac split quadrivalent PF  0.5 mL Intramuscular Tomorrow-1000  . loperamide HCl  1 mg Oral Once  . pantoprazole (PROTONIX) IV  40 mg Intravenous Q12H  . sodium chloride flush  10-40 mL Intracatheter Q12H   Continuous Infusions: . sodium chloride Stopped (02/21/18 1536)  . feeding supplement (NEPRO CARB STEADY) 1,000 mL (02/21/18 1654)  . levofloxacin (LEVAQUIN) IV Stopped (02/21/18 1812)   PRN Meds: sodium chloride, oxyCODONE **AND** acetaminophen, acetaminophen, levalbuterol, [DISCONTINUED] ondansetron **OR** ondansetron (ZOFRAN) IV, sodium chloride flush, traZODone Antibiotics: Anti-infectives (From admission, onward)   Start     Dose/Rate Route Frequency Ordered Stop    02/19/18 1000  levofloxacin (LEVAQUIN) IVPB 500 mg     500 mg 100 mL/hr over 60 Minutes Intravenous Every 48 hours 02/17/18 1106 02/23/18 2359   02/14/18 0900  Levofloxacin (LEVAQUIN) IVPB 250 mg  Status:  Discontinued     250 mg 50 mL/hr over 60 Minutes Intravenous Every 24 hours 02/13/18 0815 02/17/18 1106   02/13/18 0830  levofloxacin (LEVAQUIN) IVPB 500 mg     500 mg 100 mL/hr over 60 Minutes Intravenous  Once 02/13/18 0815 02/13/18 1150   02/12/18 2000  vancomycin (VANCOCIN) 1,500 mg in sodium chloride 0.9 % 500 mL IVPB  Status:  Discontinued     1,500 mg 250 mL/hr over 120 Minutes Intravenous Every 24 hours 02/11/18 1406 02/11/18 1521   02/12/18 2000  piperacillin-tazobactam (ZOSYN) IVPB 3.375 g  Status:  Discontinued     3.375 g 100 mL/hr over 30 Minutes Intravenous Every 6 hours 02/12/18 1408 02/13/18 0815   02/10/18 2000  vancomycin (VANCOCIN) 1,250 mg in sodium chloride 0.9 % 250 mL IVPB  Status:  Discontinued     1,250 mg 166.7 mL/hr over 90 Minutes Intravenous Every 24 hours 02/09/18 1839 02/10/18 0728   02/10/18 1530  vancomycin (VANCOCIN) IVPB 750 mg/150 ml premix  Status:  Discontinued     750 mg 150 mL/hr over 60 Minutes Intravenous Every 12 hours 02/10/18 1432 02/11/18 1406   02/10/18 1400  piperacillin-tazobactam (ZOSYN) IVPB 3.375 g  Status:  Discontinued     3.375 g 12.5 mL/hr over 240 Minutes Intravenous Every 8 hours 02/10/18 0731 02/12/18 1408   02/10/18 0745  piperacillin-tazobactam (ZOSYN) IVPB 3.375 g     3.375 g 100 mL/hr over 30 Minutes Intravenous  Once 02/10/18 0728 02/10/18 0921   02/10/18 0600  ceFEPIme (MAXIPIME) 2 g in sodium chloride 0.9 % 100 mL IVPB  Status:  Discontinued     2 g 200 mL/hr over 30 Minutes Intravenous Every 12 hours 02/09/18 1839 02/09/18 2014   02/10/18 0200  cefTRIAXone (ROCEPHIN) 1 g in sodium chloride 0.9 % 100 mL IVPB  Status:  Discontinued     1 g 200 mL/hr over 30 Minutes Intravenous Every 24 hours 02/09/18 2014 02/10/18 0728    02/09/18 2015  azithromycin (ZITHROMAX) 500 mg in sodium chloride 0.9 % 250 mL IVPB  Status:  Discontinued     500 mg 250 mL/hr over 60 Minutes Intravenous Every 24 hours 02/09/18 2014 02/12/18 1306   02/09/18 1830  vancomycin (VANCOCIN) 1,250 mg in sodium chloride 0.9 % 250 mL IVPB     1,250 mg 166.7 mL/hr over 90 Minutes Intravenous  Once 02/09/18 1800 02/09/18 2127   02/09/18 1800  ceFEPIme (MAXIPIME) 2 g in sodium chloride 0.9 % 100 mL IVPB     2 g 200 mL/hr over 30 Minutes Intravenous  Once 02/09/18 1745 02/09/18 1925   02/09/18 1800  metroNIDAZOLE (FLAGYL) IVPB 500 mg  Status:  Discontinued     500 mg 100 mL/hr over 60 Minutes Intravenous Every 8 hours 02/09/18 1745 02/10/18 0728   02/09/18 1800  vancomycin (VANCOCIN) IVPB 1000 mg/200 mL premix  Status:  Discontinued     1,000 mg 200 mL/hr over 60 Minutes Intravenous  Once 02/09/18 1745 02/09/18 1800       Objective: Physical Exam: Vitals:   02/22/18 0426 02/22/18 0500 02/22/18 0757 02/22/18 1148  BP: 129/83  129/79 121/78  Pulse: 99 95 95 89  Resp: (!) 24 20 (!) 33 (!) 24  Temp: 98.2 F (36.8 C)  (!) 97.5 F (36.4 C) 97.9 F (36.6 C)  TempSrc: Oral  Oral Oral  SpO2: 95% 97% 97% 100%  Weight:      Height:        Intake/Output Summary (Last 24 hours) at 02/22/2018 1606 Last data filed at 02/22/2018 1500 Gross per 24 hour  Intake 1939.47 ml  Output 300 ml  Net 1639.47 ml   Filed Weights   02/20/18 1240 02/20/18 1650 02/21/18 0500  Weight: 67 kg 65.5 kg 67.7 kg   General: Alert, Awake and Oriented to Time, Place and Person. Appear in mild distress, affect appropriate Eyes: PERRL, Conjunctiva normal ENT: Oral Mucosa clear moist. Neck: no JVD, no Abnormal Mass Or lumps Cardiovascular: S1 and S2 Present, no Murmur, Peripheral Pulses Present Respiratory: normal respiratory effort, Bilateral Air entry equal and Decreased, no use of accessory muscle, Clear to Auscultation, no Crackles, no wheezes Abdomen: Bowel  Sound present, Soft and no tenderness, no hernia Skin: no redness, on Rash, no induration Extremities: bilateral trace Pedal edema, no calf tenderness Neurologic: Grossly no focal neuro deficit. Bilaterally Equal motor strength although 4/5  Data Reviewed: CBC: Recent Labs  Lab 02/18/18 0319 02/19/18 0325 02/20/18 0435 02/21/18 0512 02/22/18 0631  WBC 12.7* 9.3 10.2 8.9 9.0  HGB 7.6* 7.5* 7.6* 7.5* 7.4*  HCT 23.3* 22.3* 23.3* 22.9* 23.4*  MCV 92.8 91.8 92.1 93.9 93.6  PLT PLATELET CLUMPS NOTED ON SMEAR, UNABLE TO ESTIMATE 241 PLATELET CLUMPS NOTED ON SMEAR, UNABLE TO ESTIMATE 244 209   Basic Metabolic Panel: Recent Labs  Lab 02/16/18 0420  02/17/18 0350 02/18/18 0319 02/19/18 0325 02/20/18 0435 02/20/18 2116 02/21/18 0500 02/21/18 0512 02/22/18 0631  NA 137   < > 136 138 137 138 136 137  --  136  K 3.7   < > 3.8 4.3 3.9 3.1* 3.9 4.0  --  3.9  CL 101   < > 101 104 105 102 98 99  --  98  CO2 25   < > 24 20* 16* 22 24 24   --  24  GLUCOSE 125*   < > 110* 127* 140* 99 85 87  --  116*  BUN 39*   < > 47* 106* 157* 85* 33* 44*  --  75*  CREATININE 1.65*   < > 1.60* 3.65* 5.66* 4.43* 2.70* 3.95*  --  6.30*  CALCIUM 7.6*   < > 7.9* 7.9* 7.8* 7.7* 7.6* 7.8*  --  8.0*  MG 2.6*  --  2.6*  --  3.0*  --   --   --  2.1 2.2  PHOS 1.9*   < > 2.1* 5.5* 7.5* 5.5*  --  5.5*  --  6.9*   < > = values in this interval not displayed.    Liver Function Tests: Recent Labs  Lab 02/17/18 0350 02/19/18 0325 02/20/18 0435 02/21/18 0500 02/22/18 0631  AST  --   --   --   --  33  ALT  --   --   --   --  61*  ALKPHOS  --   --   --   --  117  BILITOT  --   --   --   --  0.4  PROT  --   --   --   --  5.7*  ALBUMIN 1.6* 1.5* 1.6* 1.7* 1.6*   No results for input(s): LIPASE, AMYLASE in the last 168 hours. No results for input(s): AMMONIA in the last 168 hours. Coagulation Profile: No results for input(s): INR, PROTIME in the last 168 hours. Cardiac Enzymes: Recent Labs  Lab 02/21/18 1120   CKTOTAL 38*   BNP (last 3 results) No results for input(s): PROBNP in the last 8760 hours. CBG: Recent Labs  Lab 02/21/18 1923 02/21/18 2346 02/22/18 0351 02/22/18 0759 02/22/18 1146  GLUCAP 97 114* 111* 119* 117*   Studies: Dg Chest Port 1 View  Result Date: 02/22/2018 CLINICAL DATA:  Acute respiratory failure. EXAM: PORTABLE CHEST 1 VIEW COMPARISON:  02/21/2018 FINDINGS: Right IJ central venous catheter unchanged with tip over the SVC. Enteric tube courses into the region of the stomach and off the film as tip is not visualized. Lungs are adequately inflated demonstrate worsening patchy bilateral airspace process right worse than left likely multifocal infection. No effusion. Cardiomediastinal silhouette and remainder of the exam is unchanged. IMPRESSION: Slight worsening bilateral patchy airspace process right worse than left likely multifocal infection. Tubes and lines as described. Electronically Signed   By: Elberta Fortisaniel  Boyle M.D.   On: 02/22/2018 09:02     Time spent: 35 minutes  Author: Lynden OxfordPranav Sosaia Pittinger, MD [facility] Pager: [phone] 02/22/2018 4:06 PM  Between 7PM-7AM, please contact night-coverage at [email or web address], password [email or web address]

## 2018-02-22 NOTE — Evaluation (Signed)
Physical Therapy Evaluation Patient Details Name: Roberto BussingGary W Knapp MRN: [id number] DOB: 1970-04-26 Today's Date: 02/22/2018   History of Present Illness  Pt is a 47 y.o. M with no significant PMH who was admitted with fever, aches, SOB, abdominal pain, N/V, BRBPR who was found to have CAP, legionella infection, and progressive hypoxic failure. Developed severe ARDS requiring paralysis and proning. Intubated 12/9-12/18  Clinical Impression  Pt admitted with above diagnosis. Pt currently with functional limitations due to the deficits listed below (see PT Problem List). Patient presenting with significant decreased functional mobility secondary to cognitive impairments, decreased activity tolerance, weakness, imbalance, poor coordination. Requiring two person maximal assistance for bed mobility and unable to stand. Pt will benefit from skilled PT to increase their independence and safety with mobility to allow discharge to the venue listed below.       Follow Up Recommendations SNF v LTACH    Equipment Recommendations  Other (comment)(defer)    Recommendations for Other Services       Precautions / Restrictions Precautions Precautions: Fall Precaution Comments: cortrak, rectal tube Restrictions Weight Bearing Restrictions: No      Mobility  Bed Mobility Overal bed mobility: Needs Assistance Bed Mobility: Supine to Sit;Sit to Supine     Supine to sit: Max assist;+2 for physical assistance Sit to supine: Max assist;+2 for physical assistance   General bed mobility comments: MaxA + 2 for initiation  Transfers Overall transfer level: Needs assistance Equipment used: None Transfers: Sit to/from Stand Sit to Stand: Total assist;+2 physical assistance         General transfer comment: Unable to clear hips significantly   Ambulation/Gait                Stairs            Wheelchair Mobility    Modified Rankin (Stroke Patients Only)       Balance Overall  balance assessment: Needs assistance Sitting-balance support: Feet supported;No upper extremity supported Sitting balance-Leahy Scale: Fair Sitting balance - Comments: Able to progress to supervision-min guard assist     Standing balance-Leahy Scale: Zero                               Pertinent Vitals/Pain Pain Assessment: Faces Faces Pain Scale: Hurts little more Pain Location: bilateral legs Pain Descriptors / Indicators: Burning Pain Intervention(s): Monitored during session    Home Living Family/patient expects to be discharged to:: Private residence Living Arrangements: Spouse/significant other               Additional Comments: Pt unable to provide home living    Prior Function Level of Independence: Independent         Comments: Assume independent     Hand Dominance        Extremity/Trunk Assessment   Upper Extremity Assessment Upper Extremity Assessment: Difficult to assess due to impaired cognition    Lower Extremity Assessment Lower Extremity Assessment: Difficult to assess due to impaired cognition       Communication   Communication: Expressive difficulties(whispering)  Cognition Arousal/Alertness: Awake/alert Behavior During Therapy: Flat affect Overall Cognitive Status: Impaired/Different from baseline Area of Impairment: Orientation;Attention;Memory;Following commands;Safety/judgement;Awareness                 Orientation Level: Disoriented to;Situation Current Attention Level: Focused Memory: Decreased short-term memory Following Commands: Follows one step commands inconsistently Safety/Judgement: Decreased awareness of deficits Awareness: Intellectual  General Comments      Exercises General Exercises - Lower Extremity Long Arc Quad: 5 reps;Both;Seated   Assessment/Plan    PT Assessment Patient needs continued PT services  PT Problem List Decreased strength;Decreased activity  tolerance;Decreased balance;Decreased mobility;Decreased coordination;Decreased cognition;Decreased safety awareness;Pain       PT Treatment Interventions DME instruction;Gait training;Functional mobility training;Therapeutic activities;Therapeutic exercise;Balance training;Patient/family education    PT Goals (Current goals can be found in the Care Plan section)  Acute Rehab PT Goals Patient Stated Goal: unable to state PT Goal Formulation: Patient unable to participate in goal setting Time For Goal Achievement: 03/08/18 Potential to Achieve Goals: Fair    Frequency Min 2X/week   Barriers to discharge        Co-evaluation               AM-PAC PT "6 Clicks" Mobility  Outcome Measure Help needed turning from your back to your side while in a flat bed without using bedrails?: A Lot Help needed moving from lying on your back to sitting on the side of a flat bed without using bedrails?: A Lot Help needed moving to and from a bed to a chair (including a wheelchair)?: Total Help needed standing up from a chair using your arms (e.g., wheelchair or bedside chair)?: Total Help needed to walk in hospital room?: Total Help needed climbing 3-5 steps with a railing? : Total 6 Click Score: 8    End of Session Equipment Utilized During Treatment: Gait belt Activity Tolerance: Patient limited by fatigue Patient left: in bed;with call bell/phone within reach;with bed alarm set;with SCD's reapplied Nurse Communication: Mobility status PT Visit Diagnosis: Other abnormalities of gait and mobility (R26.89);Muscle weakness (generalized) (M62.81)    Time: [id number] PT Time Calculation (min) (ACUTE ONLY): 19 min   Charges:   PT Evaluation $PT Eval Moderate Complexity: 1 Mod         Laurina Bustlearoline Araceli Arango, [place]PT, DPT Acute Rehabilitation Services Pager [phone] Office [phone]   Vanetta MuldersCarloine H Srijan Givan 02/22/2018, 5:03 PM

## 2018-02-22 NOTE — Progress Notes (Signed)
  Speech Language Pathology Treatment: Dysphagia  Patient Details Name: Roberto Knapp MRN: [id number] DOB: Feb 06, 1971 Today's Date: 02/22/2018 Time: [id number] SLP Time Calculation (min) (ACUTE ONLY): 30 min  Assessment / Plan / Recommendation Clinical Impression  Patient seen to address dysphagia goals and determine readiness for PO's/instrumental testing. Spouse at bedside. SLP performed oral care with toothette swab and suction to remove dried bloody secretions on hard and soft palate and suctioned some clear/white secretions from back of oral cavity. Patient has NG tube in place and his wife says it is very "sensitive" and patient did flinch once when SLP touched it near nose. Patient able to masticate small ice chips and initiate swallow but required cues to close lips when swallowing. He exhibited immediate and delayed throat clearing and coughing without expectoration. Voice sounded mildly wet and patient with audible wet coughing and throat clearing.  Patient and spouse both educated on swallow function and plan for instrumental when patient ready. Patient and spouse confirm statement yesterday that they decline FEES and opt for MBSS.    HPI HPI: 89 yom admitted 02/09/18 w/ CAP, legionella infection, probable gastritis (from NSAIDS) and progressive hypoxic resp failure. Developed severe ARDS requiring paralysis and proning. PMH: intentional overdose. CXR = Diffuse right lung airspace disease and left lower lobe airspace opacity, worsened since last CXR      SLP Plan  Continue with current plan of care;MBS       Recommendations  Diet recommendations: NPO Medication Administration: Via alternative means                Follow up Recommendations: Inpatient Rehab;Skilled Nursing facility SLP Visit Diagnosis: Dysphagia, unspecified (R13.10) Plan: Continue with current plan of care;MBS       GO                Angela NevinJohn T. Clarine Elrod, MA, CCC-SLP 02/22/18 5:13 PM

## 2018-02-22 NOTE — Progress Notes (Signed)
Subjective:  Moved to floor bed- recorded 290 of urine which again is an improvement - BUN and crt worse - still c/o leg pain and inability to sleep- still not processing well   Objective Vital signs in last 24 hours: Vitals:   02/22/18 0400 02/22/18 0426 02/22/18 0500 02/22/18 0757  BP:  129/83  129/79  Pulse: 99 99 95 95  Resp: (!) 23 (!) 24 20 (!) 33  Temp:  98.2 F (36.8 C)  (!) 97.5 F (36.4 C)  TempSrc:  Oral  Oral  SpO2: 97% 95% 97% 97%  Weight:      Height:       Weight change:   Intake/Output Summary (Last 24 hours) at 02/22/2018 0803 Last data filed at 02/22/2018 0600 Gross per 24 hour  Intake 905.18 ml  Output 320 ml  Net 585.18 ml    Assessment/ Plan: Pt is a 47 y.o. yo male baseline crt 1.2 who was admitted on 02/09/2018 with febrile illness/PNA/resp failure developed AKI  Assessment/Plan: 1. Renal- AKI in the setting of above with likely ATN/recent hx of NSAIDS/IV contrast-  CRRT from 12/11-  12/16.  min UOP so no signs of recovery yet.   IHD 12/18 and 12/19 as was catabolic- now follow UOP and labs - will plan for HD again tomorrow UNLESS UOP really picks up.  Vascath in place now 10 days, probably need to think about removing soon-  2. PNA - per CCM- VDRF- WBC decreasing-  3. Anemia- hgb dropping in house, felt to have GIB possibly gastritis-  Have added ESA- no iron due to recent sepsis - transfuse PRN - ho heparin with HD- fairly stable in the 7's  4. HTN/volume- overloaded, tolerated fairly aggressive UF with CRRT and IHD- much better-  cont to challenge gently with IHD as able however with care not to drop BP to hamper recovery   Cecille AverKellie A Wyona Neils    Labs: Basic Metabolic Panel: Recent Labs  Lab 02/20/18 0435 02/20/18 2116 02/21/18 0500 02/22/18 0631  NA 138 136 137 136  K 3.1* 3.9 4.0 3.9  CL 102 98 99 98  CO2 22 24 24 24   GLUCOSE 99 85 87 116*  BUN 85* 33* 44* 75*  CREATININE 4.43* 2.70* 3.95* 6.30*  CALCIUM 7.7* 7.6* 7.8* 8.0*  PHOS  5.5*  --  5.5* 6.9*   Liver Function Tests: Recent Labs  Lab 02/20/18 0435 02/21/18 0500 02/22/18 0631  AST  --   --  33  ALT  --   --  61*  ALKPHOS  --   --  117  BILITOT  --   --  0.4  PROT  --   --  5.7*  ALBUMIN 1.6* 1.7* 1.6*   No results for input(s): LIPASE, AMYLASE in the last 168 hours. No results for input(s): AMMONIA in the last 168 hours. CBC: Recent Labs  Lab 02/18/18 0319 02/19/18 0325 02/20/18 0435 02/21/18 0512 02/22/18 0631  WBC 12.7* 9.3 10.2 8.9 9.0  HGB 7.6* 7.5* 7.6* 7.5* 7.4*  HCT 23.3* 22.3* 23.3* 22.9* 23.4*  MCV 92.8 91.8 92.1 93.9 93.6  PLT PLATELET CLUMPS NOTED ON SMEAR, UNABLE TO ESTIMATE 241 PLATELET CLUMPS NOTED ON SMEAR, UNABLE TO ESTIMATE 244 209   Cardiac Enzymes: Recent Labs  Lab 02/21/18 1120  CKTOTAL 38*   CBG: Recent Labs  Lab 02/21/18 1641 02/21/18 1923 02/21/18 2346 02/22/18 0351 02/22/18 0759  GLUCAP 89 97 114* 111* 119*    Iron Studies: No results  for input(s): IRON, TIBC, TRANSFERRIN, FERRITIN in the last 72 hours. Studies/Results: Dg Chest Port 1 View  Result Date: 02/21/2018 CLINICAL DATA:  Acute respiratory failure EXAM: PORTABLE CHEST 1 VIEW COMPARISON:  02/19/2017 FINDINGS: Interval extubation and removal of NG tube. Right Vas-Cath, left central line remain in place, unchanged. Diffuse right lung airspace disease and patchy left lower lobe opacities slightly increased since prior study. Heart is upper limits normal in size. No visible effusions. IMPRESSION: Diffuse right lung airspace disease and left lower lobe airspace opacity, slightly worsened since prior study. Electronically Signed   By: Charlett NoseKevin  Dover M.D.   On: 02/21/2018 07:06   Medications: Infusions: . sodium chloride Stopped (02/21/18 1536)  . dextrose 5 % and 0.45% NaCl 50 mL/hr at 02/21/18 1535  . feeding supplement (NEPRO CARB STEADY) 1,000 mL (02/21/18 1654)  . levofloxacin (LEVAQUIN) IV Stopped (02/21/18 1812)    Scheduled Medications: .  bacitracin   Topical BID  . Chlorhexidine Gluconate Cloth  6 each Topical Daily  . darbepoetin (ARANESP) injection - NON-DIALYSIS  100 mcg Subcutaneous Q Tue-1800  . heparin injection (subcutaneous)  5,000 Units Subcutaneous Q8H  . Influenza vac split quadrivalent PF  0.5 mL Intramuscular Tomorrow-1000  . pantoprazole (PROTONIX) IV  40 mg Intravenous Q12H  . sodium chloride flush  10-40 mL Intracatheter Q12H    have reviewed scheduled and prn medications.  Physical Exam: General: alert, staring but does nod responses- a little worse than yest (uremia ?)  Heart:  RRR Lungs: BCS bilat Abdomen: soft, non tender Extremities: pitting edema to dep areas- although much better than previous  Dialysis Access: right IJ vascath placed 12/11    02/22/2018,8:03 AM  LOS: 13 days

## 2018-02-23 ENCOUNTER — Encounter (HOSPITAL_COMMUNITY): Payer: BLUE CROSS/BLUE SHIELD

## 2018-02-23 LAB — RENAL FUNCTION PANEL
Albumin: 1.6 g/dL — ABNORMAL LOW (ref 3.5–5.0)
Anion gap: 16 — ABNORMAL HIGH (ref 5–15)
BUN: 99 mg/dL — ABNORMAL HIGH (ref 6–20)
CO2: 23 mmol/L (ref 22–32)
Calcium: 8.1 mg/dL — ABNORMAL LOW (ref 8.9–10.3)
Chloride: 99 mmol/L (ref 98–111)
Creatinine, Ser: 8.02 mg/dL — ABNORMAL HIGH (ref 0.61–1.24)
GFR calc Af Amer: 8 mL/min — ABNORMAL LOW (ref >60)
GFR calc non Af Amer: 7 mL/min — ABNORMAL LOW (ref >60)
Glucose, Bld: 107 mg/dL — ABNORMAL HIGH (ref 70–99)
Phosphorus: 7.9 mg/dL — ABNORMAL HIGH (ref 2.5–4.6)
Potassium: 4.4 mmol/L (ref 3.5–5.1)
Sodium: 138 mmol/L (ref 135–145)

## 2018-02-23 LAB — CBC
HCT: 23.3 % — ABNORMAL LOW (ref 39.0–52.0)
Hemoglobin: 7.4 g/dL — ABNORMAL LOW (ref 13.0–17.0)
MCH: 30 pg (ref 26.0–34.0)
MCHC: 31.8 g/dL (ref 30.0–36.0)
MCV: 94.3 fL (ref 80.0–100.0)
Platelets: 230 10*3/uL (ref 150–400)
RBC: 2.47 MIL/uL — ABNORMAL LOW (ref 4.22–5.81)
RDW: 16 % — ABNORMAL HIGH (ref 11.5–15.5)
WBC: 10 10*3/uL (ref 4.0–10.5)
nRBC: 0 % (ref 0.0–0.2)

## 2018-02-23 LAB — GLUCOSE, CAPILLARY
Glucose-Capillary: 94 mg/dL (ref 70–99)
Glucose-Capillary: 102 mg/dL — ABNORMAL HIGH (ref 70–99)

## 2018-02-23 MED ORDER — LOPERAMIDE HCL 1 MG/7.5ML PO SUSP
2.0000 mg | Freq: Every day | ORAL | Status: AC
  Administered 2018-02-23 – 2018-02-24 (×2): 2 mg via ORAL
  Filled 2018-02-23 (×2): qty 15

## 2018-02-23 MED ORDER — HEPARIN SODIUM (PORCINE) 1000 UNIT/ML IJ SOLN
INTRAMUSCULAR | Status: AC
  Administered 2018-02-23: 2400 [IU]
  Filled 2018-02-23: qty 3

## 2018-02-23 MED ORDER — GUAIFENESIN 100 MG/5ML PO SOLN
10.0000 mL | Freq: Three times a day (TID) | ORAL | Status: DC
  Administered 2018-02-23 – 2018-03-03 (×24): 200 mg
  Filled 2018-02-23: qty 10
  Filled 2018-02-23: qty 5
  Filled 2018-02-23: qty 10
  Filled 2018-02-23: qty 5
  Filled 2018-02-23: qty 10
  Filled 2018-02-23 (×6): qty 5
  Filled 2018-02-23: qty 10
  Filled 2018-02-23 (×2): qty 5
  Filled 2018-02-23 (×8): qty 10
  Filled 2018-02-23: qty 5
  Filled 2018-02-23 (×3): qty 10

## 2018-02-23 MED ORDER — LEVALBUTEROL HCL 0.63 MG/3ML IN NEBU
0.6300 mg | INHALATION_SOLUTION | Freq: Three times a day (TID) | RESPIRATORY_TRACT | Status: DC
  Administered 2018-02-23 – 2018-02-26 (×7): 0.63 mg via RESPIRATORY_TRACT
  Filled 2018-02-23 (×10): qty 3

## 2018-02-23 NOTE — Plan of Care (Signed)
  Problem: Education: Goal: Ability to identify signs and symptoms of gastrointestinal bleeding will improve Outcome: Progressing   Problem: Bowel/Gastric: Goal: Will show no signs and symptoms of gastrointestinal bleeding Outcome: Progressing   Problem: Fluid Volume: Goal: Will show no signs and symptoms of excessive bleeding Outcome: Progressing   Problem: Clinical Measurements: Goal: Complications related to the disease process, condition or treatment will be avoided or minimized Outcome: Progressing   Problem: Education: Goal: Knowledge of General Education information will improve Description Including pain rating scale, medication(s)/side effects and non-pharmacologic comfort measures Outcome: Progressing   Problem: Health Behavior/Discharge Planning: Goal: Ability to manage health-related needs will improve Outcome: Progressing   Problem: Clinical Measurements: Goal: Ability to maintain clinical measurements within normal limits will improve Outcome: Progressing Goal: Will remain free from infection Outcome: Progressing Goal: Diagnostic test results will improve Outcome: Progressing Goal: Respiratory complications will improve Outcome: Progressing Goal: Cardiovascular complication will be avoided Outcome: Progressing   Problem: Nutrition: Goal: Adequate nutrition will be maintained Outcome: Progressing   Problem: Coping: Goal: Level of anxiety will decrease Outcome: Progressing   Problem: Elimination: Goal: Will not experience complications related to bowel motility Outcome: Progressing Goal: Will not experience complications related to urinary retention Outcome: Progressing   Problem: Pain Managment: Goal: General experience of comfort will improve Outcome: Progressing   Problem: Safety: Goal: Ability to remain free from injury will improve Outcome: Progressing   Problem: Skin Integrity: Goal: Risk for impaired skin integrity will  decrease Outcome: Progressing   Problem: Respiratory: Goal: Ability to maintain a clear airway and adequate ventilation will improve Outcome: Progressing   Problem: Role Relationship: Goal: Method of communication will improve Outcome: Progressing   Problem: Fluid Volume: Goal: Hemodynamic stability will improve Outcome: Progressing   Problem: Clinical Measurements: Goal: Diagnostic test results will improve Outcome: Progressing Goal: Signs and symptoms of infection will decrease Outcome: Progressing   Problem: Activity: Goal: Activity intolerance will improve Outcome: Progressing   Problem: Fluid Volume: Goal: Fluid volume balance will be maintained or improved Outcome: Progressing   Problem: Nutritional: Goal: Ability to make appropriate dietary choices will improve Outcome: Progressing   Problem: Respiratory: Goal: Respiratory symptoms related to disease process will be avoided Outcome: Progressing

## 2018-02-23 NOTE — Progress Notes (Signed)
[facility] Progress Note  Patient: Roberto Knapp UUV:[id number]   PCP: [name], MD DOB: 03/28/1970   DOA: 02/09/2018   DOS: 02/23/2018   Date of Service: the patient was seen and examined on 02/23/2018  Brief hospital course: Pt. With no significant PMH; admitted on 02/09/2018, presented with complaint of Fever, aches, SOB, abdominal pain, N/V, melena, BRBPR, was found to have CAP, legionella infection, probable gastritis (from NSAIDS) and progressive hypoxic resp failure. Developed severe ARDS requiring paralysis and proning. Currently further plan is continue Antibiotics, monitor renal function, and oral intake.  Subjective: Continues to have diarrhea.  No abdominal pain.  No complaint of pain in the leg or pain in the arm.  No nausea no vomiting.  Assessment and Plan: 1.  Acute hypoxic respiratory failure Community-acquired pneumonia secondary to Legionella ARDS Sepsis with septic shock currently resolved, present on admission. Acute toxic and metabolic encephalopathy. Presents with cough and shortness of breath.  Initially admitted to the floor later on transferred to ICU requiring intubation. Patient developed ARDS. Hypoxia was so severe that patient required proning Currently on room air. Initially treated with IV ceftriaxone azithromycin followed by IV Zosyn and vancomycin followed by IV Levaquin. Leukocytosis is normalized. We will complete course of antibiotic recommended by PCCM. So far cultures are negative. Only positive findings is Legionella. Required IV pressors in the ICU currently resolved.  Patient was also on IV stress dose steroids in the ICU.  Currently stop.  2.  Acute kidney injury. Mixed anion gap and non-anion gap metabolic and respiratory acidosis. Mixture of prerenal etiology, ATN and hemodynamic disturbances. Nephrology was consulted. Patient required CRRT in the ICU. Currently requiring intermittent HD. Has temporary dialysis catheter in right  IJ. Urine output is slowly improving. Monitor.  3.  Multifactorial acute metabolic and toxic encephalopathy. Likely hospital/ICU induced delirium, uremia, hypoxic injury, medications induced. Also appears to have critical illness weakness. Alert awake and oriented x3 right now but not following commands consistently. Monitor for now. Speech therapy consulted for cognitive evaluation along with swallowing evaluation.  4.  Dysphagia. Speech therapy consult for swallowing. Currently n.p.o. Core track inserted for nutrition. Continue Nepro.  5. Anemia,  multifactorial secondary to acute blood loss secondary to GI bleeding, critical illness with sepsis and renal disease Continue to monitor H&H. Transfuse for hemoglobin less than 7. Continue PPI twice a day. Transition to per tube tomorrow. Since the patient does not appear to have any active GI bleeding currently GI was not consulted.  6.  A. fib with RVR. Suspecting A. fib was paroxysmal currently sinus rhythm. Patient was on amiodarone for rate control. Anticoagulation was deferred secondary to GI bleed. Suspect that the patient will likely not need anticoagulation going forward if he does not have any further episodes of A. fib.  7.  LFT elevation. Currently stable.  8.  Bilateral leg pain. CK normal. Etiology not clear. Currently no pain. Continue Percocet.  9.  Diarrhea. Likely secondary to tube feeding. Also secondary to antibiotics use. No abdominal pain, abdominal distention, nausea vomiting, leukocytosis. Does not appear to be C. difficile. We will tries Imodium and monitor. Continue rectal tube.  10.  Left upper extremity pain. Mild edema noted. We will check ultrasound to rule out DVT.  Pressure Injury 02/18/18 Deep Tissue Injury - Purple or maroon localized area of discolored intact skin or blood-filled blister due to damage of underlying soft tissue from pressure and/or shear. Deep tissue injury superiorly  and inferiorly to urinary me (  Active)  02/18/18 2000  Location: Penis  Location Orientation: Mid;Upper;Lower  Staging: Deep Tissue Injury - Purple or maroon localized area of discolored intact skin or blood-filled blister due to damage of underlying soft tissue from pressure and/or shear.  Wound Description (Comments): Deep tissue injury superiorly and inferiorly to urinary meatus  Present on Admission: No     Diet: tube feed DVT Prophylaxis: mechanical compression device  Advance goals of care discussion: full code  Family Communication: family was present at bedside, at the time of interview. The pt provided permission to discuss medical plan with the family. Opportunity was given to ask question and all questions were answered satisfactorily.   Disposition:  Discharge to be determined.  Consultants: Nephrology, PCCM Procedures:  OETT 12/9 >> 12/18 Art line 12/9 >> 12/17 Lt IJ CVL 12/9 >>  Rt IJ HD cath 12/11  Scheduled Meds: . ALPRAZolam  0.25 mg Oral Once  . bacitracin   Topical BID  . Chlorhexidine Gluconate Cloth  6 each Topical Q0600  . darbepoetin (ARANESP) injection - NON-DIALYSIS  100 mcg Subcutaneous Q Tue-1800  . guaiFENesin  10 mL Per Tube TID  . heparin injection (subcutaneous)  5,000 Units Subcutaneous Q8H  . Influenza vac split quadrivalent PF  0.5 mL Intramuscular Tomorrow-1000  . levalbuterol  0.63 mg Nebulization Q8H  . pantoprazole (PROTONIX) IV  40 mg Intravenous Q12H  . sodium chloride flush  10-40 mL Intracatheter Q12H   Continuous Infusions: . sodium chloride Stopped (02/21/18 1536)  . feeding supplement (NEPRO CARB STEADY) 1,000 mL (02/22/18 2110)   PRN Meds: sodium chloride, oxyCODONE **AND** acetaminophen, acetaminophen, [DISCONTINUED] ondansetron **OR** ondansetron (ZOFRAN) IV, sodium chloride flush, traZODone Antibiotics: Anti-infectives (From admission, onward)   Start     Dose/Rate Route Frequency Ordered Stop   02/19/18 1000  levofloxacin  (LEVAQUIN) IVPB 500 mg     500 mg 100 mL/hr over 60 Minutes Intravenous Every 48 hours 02/17/18 1106 02/23/18 1310   02/14/18 0900  Levofloxacin (LEVAQUIN) IVPB 250 mg  Status:  Discontinued     250 mg 50 mL/hr over 60 Minutes Intravenous Every 24 hours 02/13/18 0815 02/17/18 1106   02/13/18 0830  levofloxacin (LEVAQUIN) IVPB 500 mg     500 mg 100 mL/hr over 60 Minutes Intravenous  Once 02/13/18 0815 02/13/18 1150   02/12/18 2000  vancomycin (VANCOCIN) 1,500 mg in sodium chloride 0.9 % 500 mL IVPB  Status:  Discontinued     1,500 mg 250 mL/hr over 120 Minutes Intravenous Every 24 hours 02/11/18 1406 02/11/18 1521   02/12/18 2000  piperacillin-tazobactam (ZOSYN) IVPB 3.375 g  Status:  Discontinued     3.375 g 100 mL/hr over 30 Minutes Intravenous Every 6 hours 02/12/18 1408 02/13/18 0815   02/10/18 2000  vancomycin (VANCOCIN) 1,250 mg in sodium chloride 0.9 % 250 mL IVPB  Status:  Discontinued     1,250 mg 166.7 mL/hr over 90 Minutes Intravenous Every 24 hours 02/09/18 1839 02/10/18 0728   02/10/18 1530  vancomycin (VANCOCIN) IVPB 750 mg/150 ml premix  Status:  Discontinued     750 mg 150 mL/hr over 60 Minutes Intravenous Every 12 hours 02/10/18 1432 02/11/18 1406   02/10/18 1400  piperacillin-tazobactam (ZOSYN) IVPB 3.375 g  Status:  Discontinued     3.375 g 12.5 mL/hr over 240 Minutes Intravenous Every 8 hours 02/10/18 0731 02/12/18 1408   02/10/18 0745  piperacillin-tazobactam (ZOSYN) IVPB 3.375 g     3.375 g 100 mL/hr over 30 Minutes Intravenous  Once 02/10/18 0728 02/10/18 0921   02/10/18 0600  ceFEPIme (MAXIPIME) 2 g in sodium chloride 0.9 % 100 mL IVPB  Status:  Discontinued     2 g 200 mL/hr over 30 Minutes Intravenous Every 12 hours 02/09/18 1839 02/09/18 2014   02/10/18 0200  cefTRIAXone (ROCEPHIN) 1 g in sodium chloride 0.9 % 100 mL IVPB  Status:  Discontinued     1 g 200 mL/hr over 30 Minutes Intravenous Every 24 hours 02/09/18 2014 02/10/18 0728   02/09/18 2015   azithromycin (ZITHROMAX) 500 mg in sodium chloride 0.9 % 250 mL IVPB  Status:  Discontinued     500 mg 250 mL/hr over 60 Minutes Intravenous Every 24 hours 02/09/18 2014 02/12/18 1306   02/09/18 1830  vancomycin (VANCOCIN) 1,250 mg in sodium chloride 0.9 % 250 mL IVPB     1,250 mg 166.7 mL/hr over 90 Minutes Intravenous  Once 02/09/18 1800 02/09/18 2127   02/09/18 1800  ceFEPIme (MAXIPIME) 2 g in sodium chloride 0.9 % 100 mL IVPB     2 g 200 mL/hr over 30 Minutes Intravenous  Once 02/09/18 1745 02/09/18 1925   02/09/18 1800  metroNIDAZOLE (FLAGYL) IVPB 500 mg  Status:  Discontinued     500 mg 100 mL/hr over 60 Minutes Intravenous Every 8 hours 02/09/18 1745 02/10/18 0728   02/09/18 1800  vancomycin (VANCOCIN) IVPB 1000 mg/200 mL premix  Status:  Discontinued     1,000 mg 200 mL/hr over 60 Minutes Intravenous  Once 02/09/18 1745 02/09/18 1800       Objective: Physical Exam: Vitals:   02/23/18 1100 02/23/18 1119 02/23/18 1207 02/23/18 1457  BP: 114/76 115/73 117/73   Pulse: 80 83 86   Resp:  (!) 26 17   Temp:  98.5 F (36.9 C) 98.4 F (36.9 C)   TempSrc:  Oral Oral   SpO2:  100% 95% (!) 88%  Weight:  63.7 kg    Height:        Intake/Output Summary (Last 24 hours) at 02/23/2018 1626 Last data filed at 02/23/2018 1119 Gross per 24 hour  Intake 275 ml  Output 2800 ml  Net -2525 ml   Filed Weights   02/21/18 0500 02/23/18 0710 02/23/18 1119  Weight: 67.7 kg 66 kg 63.7 kg   General: Alert, Awake and Oriented to Time, Place and Person. Appear in mild distress, affect appropriate Eyes: PERRL, Conjunctiva normal ENT: Oral Mucosa clear moist. Neck: no JVD, no Abnormal Mass Or lumps Cardiovascular: S1 and S2 Present, no Murmur, Peripheral Pulses Present Respiratory: normal respiratory effort, Bilateral Air entry equal and Decreased, no use of accessory muscle, Clear to Auscultation, no Crackles, no wheezes Abdomen: Bowel Sound present, Soft and no tenderness, no hernia Skin:  no redness, on Rash, no induration Extremities: bilateral trace Pedal edema, no calf tenderness Neurologic: Grossly no focal neuro deficit. Bilaterally Equal motor strength although 4/5  Data Reviewed: CBC: Recent Labs  Lab 02/19/18 0325 02/20/18 0435 02/21/18 0512 02/22/18 0631 02/23/18 0642  WBC 9.3 10.2 8.9 9.0 10.0  HGB 7.5* 7.6* 7.5* 7.4* 7.4*  HCT 22.3* 23.3* 22.9* 23.4* 23.3*  MCV 91.8 92.1 93.9 93.6 94.3  PLT 241 PLATELET CLUMPS NOTED ON SMEAR, UNABLE TO ESTIMATE 244 209 230   Basic Metabolic Panel: Recent Labs  Lab 02/17/18 0350  02/19/18 0325 02/20/18 0435 02/20/18 2116 02/21/18 0500 02/21/18 0512 02/22/18 0631 02/23/18 0642  NA 136   < > 137 138 136 137  --  136 138  K 3.8   < > 3.9 3.1* 3.9 4.0  --  3.9 4.4  CL 101   < > 105 102 98 99  --  98 99  CO2 24   < > 16* 22 24 24   --  24 23  GLUCOSE 110*   < > 140* 99 85 87  --  116* 107*  BUN 47*   < > 157* 85* 33* 44*  --  75* 99*  CREATININE 1.60*   < > 5.66* 4.43* 2.70* 3.95*  --  6.30* 8.02*  CALCIUM 7.9*   < > 7.8* 7.7* 7.6* 7.8*  --  8.0* 8.1*  MG 2.6*  --  3.0*  --   --   --  2.1 2.2  --   PHOS 2.1*   < > 7.5* 5.5*  --  5.5*  --  6.9* 7.9*   < > = values in this interval not displayed.    Liver Function Tests: Recent Labs  Lab 02/19/18 0325 02/20/18 0435 02/21/18 0500 02/22/18 0631 02/23/18 0642  AST  --   --   --  33  --   ALT  --   --   --  61*  --   ALKPHOS  --   --   --  117  --   BILITOT  --   --   --  0.4  --   PROT  --   --   --  5.7*  --   ALBUMIN 1.5* 1.6* 1.7* 1.6* 1.6*   No results for input(s): LIPASE, AMYLASE in the last 168 hours. No results for input(s): AMMONIA in the last 168 hours. Coagulation Profile: No results for input(s): INR, PROTIME in the last 168 hours. Cardiac Enzymes: Recent Labs  Lab 02/21/18 1120  CKTOTAL 38*   BNP (last 3 results) No results for input(s): PROBNP in the last 8760 hours. CBG: Recent Labs  Lab 02/22/18 0351 02/22/18 0759 02/22/18 1146  02/22/18 1619 02/22/18 2023  GLUCAP 111* 119* 117* 112* 108*   Studies: No results found.   Time spent: 35 minutes  Author: Lynden OxfordPranav Amoni Morales, MD [facility] Pager: [phone] 02/23/2018 4:26 PM  Between 7PM-7AM, please contact night-coverage at [email or web address], password [email or web address]

## 2018-02-23 NOTE — Progress Notes (Signed)
Subjective:  Moved to floor bed- recorded 800 of urine which again is an improvement - BUN and crt worse - still c/o leg pain and inability to sleep- still not processing well - seen on HD   Objective Vital signs in last 24 hours: Vitals:   02/23/18 0815 02/23/18 0845 02/23/18 0900 02/23/18 0915  BP: 135/75 120/76 121/72 115/70  Pulse: 78 77 72 76  Resp:      Temp:      TempSrc:      SpO2:      Weight:      Height:       Weight change:   Intake/Output Summary (Last 24 hours) at 02/23/2018 0930 Last data filed at 02/23/2018 [id number] Gross per 24 hour  Intake 1449.44 ml  Output 800 ml  Net 649.44 ml    Assessment/ Plan: Pt is a 47 y.o. yo male baseline crt 1.2 who was admitted on 02/09/2018 with febrile illness/PNA/resp failure developed AKI  Assessment/Plan: 1. Renal- AKI in the setting of above with likely ATN/recent hx of NSAIDS/IV contrast-  CRRT from 12/11-  12/16.     IHD 12/18 and 12/19 and today 12/22 as was catabolic- now follow UOP and labs -   Vascath in place now 11 days, probably need to think about removing soon.... hope that increased UOP is sign of impending renal recovery   2. PNA - per CCM- VDRF- WBC decreasing-  3. Anemia- hgb dropping in house, felt to have GIB possibly gastritis-  Have added ESA- no iron due to recent sepsis - transfuse PRN - ho heparin with HD- fairly stable in the 7's  4. HTN/volume- overloaded, tolerated aggressive UF with CRRT and IHD- much better-  cont to challenge gently with IHD as able however with care not to drop BP to hamper recovery  5. Hyperphos- trying to control with HD only at this point   Cecille AverKellie A Lilliahna Schubring    Labs: Basic Metabolic Panel: Recent Labs  Lab 02/21/18 0500 02/22/18 0631 02/23/18 0642  NA 137 136 138  K 4.0 3.9 4.4  CL 99 98 99  CO2 24 24 23   GLUCOSE 87 116* 107*  BUN 44* 75* 99*  CREATININE 3.95* 6.30* 8.02*  CALCIUM 7.8* 8.0* 8.1*  PHOS 5.5* 6.9* 7.9*   Liver Function Tests: Recent Labs  Lab  02/21/18 0500 02/22/18 0631 02/23/18 0642  AST  --  33  --   ALT  --  61*  --   ALKPHOS  --  117  --   BILITOT  --  0.4  --   PROT  --  5.7*  --   ALBUMIN 1.7* 1.6* 1.6*   No results for input(s): LIPASE, AMYLASE in the last 168 hours. No results for input(s): AMMONIA in the last 168 hours. CBC: Recent Labs  Lab 02/19/18 0325 02/20/18 0435 02/21/18 0512 02/22/18 0631 02/23/18 0642  WBC 9.3 10.2 8.9 9.0 10.0  HGB 7.5* 7.6* 7.5* 7.4* 7.4*  HCT 22.3* 23.3* 22.9* 23.4* 23.3*  MCV 91.8 92.1 93.9 93.6 94.3  PLT 241 PLATELET CLUMPS NOTED ON SMEAR, UNABLE TO ESTIMATE 244 209 230   Cardiac Enzymes: Recent Labs  Lab 02/21/18 1120  CKTOTAL 38*   CBG: Recent Labs  Lab 02/22/18 0351 02/22/18 0759 02/22/18 1146 02/22/18 1619 02/22/18 2023  GLUCAP 111* 119* 117* 112* 108*    Iron Studies: No results for input(s): IRON, TIBC, TRANSFERRIN, FERRITIN in the last 72 hours. Studies/Results: Dg Chest Port 1 [address]  Result Date: 02/22/2018 CLINICAL DATA:  Acute respiratory failure. EXAM: PORTABLE CHEST 1 VIEW COMPARISON:  02/21/2018 FINDINGS: Right IJ central venous catheter unchanged with tip over the SVC. Enteric tube courses into the region of the stomach and off the film as tip is not visualized. Lungs are adequately inflated demonstrate worsening patchy bilateral airspace process right worse than left likely multifocal infection. No effusion. Cardiomediastinal silhouette and remainder of the exam is unchanged. IMPRESSION: Slight worsening bilateral patchy airspace process right worse than left likely multifocal infection. Tubes and lines as described. Electronically Signed   By: Elberta Fortisaniel  Boyle M.D.   On: 02/22/2018 09:02   Medications: Infusions: . sodium chloride Stopped (02/21/18 1536)  . feeding supplement (NEPRO CARB STEADY) 1,000 mL (02/22/18 2110)  . levofloxacin (LEVAQUIN) IV Stopped (02/21/18 1812)    Scheduled Medications: . ALPRAZolam  0.25 mg Oral Once  . bacitracin    Topical BID  . Chlorhexidine Gluconate Cloth  6 each Topical Q0600  . darbepoetin (ARANESP) injection - NON-DIALYSIS  100 mcg Subcutaneous Q Tue-1800  . heparin injection (subcutaneous)  5,000 Units Subcutaneous Q8H  . Influenza vac split quadrivalent PF  0.5 mL Intramuscular Tomorrow-1000  . pantoprazole (PROTONIX) IV  40 mg Intravenous Q12H  . sodium chloride flush  10-40 mL Intracatheter Q12H    have reviewed scheduled and prn medications.  Physical Exam: General: alert, staring but does nod responses- said "ready to go"  Seems to understand better ??   Heart:  RRR Lungs: BCS bilat Abdomen: soft, non tender Extremities: pitting edema to dep areas- although much better than previous  Dialysis Access: right IJ vascath placed 12/11    02/23/2018,9:30 AM  LOS: 14 days

## 2018-02-23 NOTE — Procedures (Signed)
Patient was seen on dialysis and the procedure was supervised.  BFR 400  Via temp HD cath BP is  115/70.   Patient appears to be tolerating treatment well  Roberto Knapp 02/23/2018

## 2018-02-23 NOTE — Progress Notes (Signed)
SLP Cancellation Note  Patient Details Name: Roberto Knapp MRN: [id number] DOB: Nov 27, 1970   Cancelled treatment:        Pt currently in HD. Will follow up next date to assess readiness for POs/instrumental assessment.  Rondel BatonMary Beth Latice Waitman, [place]MS, CCC-SLP Speech-Language Pathologist Acute Rehabilitation Services Pager: [phone] Office: [phone]                                                                                                 Roberto Knapp 02/23/2018, 10:04 AM

## 2018-02-24 ENCOUNTER — Inpatient Hospital Stay (HOSPITAL_COMMUNITY): Payer: BLUE CROSS/BLUE SHIELD

## 2018-02-24 DIAGNOSIS — R609 Edema, unspecified: Secondary | ICD-10-CM

## 2018-02-24 DIAGNOSIS — R52 Pain, unspecified: Secondary | ICD-10-CM

## 2018-02-24 LAB — GLUCOSE, CAPILLARY
Glucose-Capillary: 109 mg/dL — ABNORMAL HIGH (ref 70–99)
Glucose-Capillary: 96 mg/dL (ref 70–99)
Glucose-Capillary: 101 mg/dL — ABNORMAL HIGH (ref 70–99)
Glucose-Capillary: 97 mg/dL (ref 70–99)
Glucose-Capillary: 99 mg/dL (ref 70–99)
Glucose-Capillary: 102 mg/dL — ABNORMAL HIGH (ref 70–99)

## 2018-02-24 LAB — RENAL FUNCTION PANEL
Albumin: 1.7 g/dL — ABNORMAL LOW (ref 3.5–5.0)
Anion gap: 14 (ref 5–15)
BUN: 46 mg/dL — ABNORMAL HIGH (ref 6–20)
CO2: 27 mmol/L (ref 22–32)
Calcium: 8 mg/dL — ABNORMAL LOW (ref 8.9–10.3)
Chloride: 96 mmol/L — ABNORMAL LOW (ref 98–111)
Creatinine, Ser: 4.95 mg/dL — ABNORMAL HIGH (ref 0.61–1.24)
GFR calc Af Amer: 15 mL/min — ABNORMAL LOW (ref >60)
GFR calc non Af Amer: 13 mL/min — ABNORMAL LOW (ref >60)
Glucose, Bld: 105 mg/dL — ABNORMAL HIGH (ref 70–99)
Phosphorus: 5.6 mg/dL — ABNORMAL HIGH (ref 2.5–4.6)
Potassium: 4.4 mmol/L (ref 3.5–5.1)
Sodium: 137 mmol/L (ref 135–145)

## 2018-02-24 MED ORDER — PANTOPRAZOLE SODIUM 40 MG PO PACK
40.0000 mg | PACK | Freq: Every day | ORAL | Status: DC
  Administered 2018-02-25 – 2018-02-28 (×4): 40 mg
  Filled 2018-02-24 (×4): qty 20

## 2018-02-24 MED ORDER — PANTOPRAZOLE SODIUM 40 MG IV SOLR
40.0000 mg | Freq: Once | INTRAVENOUS | Status: AC
  Administered 2018-02-24: 40 mg via INTRAVENOUS

## 2018-02-24 NOTE — Consult Note (Addendum)
WOC Nurse wound follow-up consult note Reason for Consult: MDRPI unstageable on penis from foley catheter and being in the prone position while in ICU Wound bed:100% black scab to tip of penis near catheter, .5X.5cm Drainage (amount, consistency, odor) no odor, drainage, or fluctuance, edges beginning to lift slightly Dressing procedure/placement/frequency: Continue Bacitracin BID to keep wound moist and promote healing. WOC will follow weekly to assess wound appearance. Discussed plan of care with patient and wife at the bedside [name] MSN, RN, [place]OCN, [place]CN-AP, CNS [phone]

## 2018-02-24 NOTE — Progress Notes (Signed)
LUE venous duplex       has been completed. Preliminary results can be found under CV proc through chart review. Neiman Roots, BS, RDMS, RVT   

## 2018-02-24 NOTE — Progress Notes (Signed)
Modified Barium Swallow Progress Note  Patient Details  Name: Roberto Knapp MRN: [id number] Date of Birth: February 02, 1971  Today's Date: 02/24/2018  Modified Barium Swallow completed.  Full report located under Chart Review in the Imaging Section.  Brief recommendations include the following:  Clinical Impression  Pt has mild, generalized oropharyngeal weakness that allows for reduced lingual propulsion orally but also with reduced base of tongue retraction, hyolaryngeal movement, and epiglottic inversion. His oral clearance is adequate and he has only trace to mild amounts of vallecular residue. He has trace amounts of penetration during the swallow with all consistencies tested except for honey thick liquids by tsp. The amount of other consistencies penetrated is very small, but unfortunately he cannot clear them with a cued cough, and even during testing they begin to fall to the true vocal folds without sensation. Suspect that across a meal tray, aspiration would be likely. Recommend SLP f/u for introduction of therapeutic trials of honey thick liquids by spoon and use of strengthening exercises (RMT?) to facilitate return to POs, with prognosis good given additional time post-extubation and improved strength of cough.   Swallow Evaluation Recommendations       SLP Diet Recommendations: NPO;Alternative means - temporary       Medication Administration: Via alternative means               Oral Care Recommendations: Oral care QID        Roberto Knapp, Roberto Knapp 02/24/2018,1:22 PM   Roberto Knapp, M.A. CCC-SLP Acute Herbalistehabilitation Services Pager [phone] Office [phone]

## 2018-02-24 NOTE — Progress Notes (Signed)
[facility] Progress Note  Patient: Roberto Knapp QMV:[id number]   PCP: Deatra JamesSun, Vyvyan, MD DOB: 02/04/1971   DOA: 02/09/2018   DOS: 02/24/2018   Date of Service: the patient was seen and examined on 02/24/2018  Brief hospital course: Pt. With no significant PMH; admitted on 02/09/2018, presented with complaint of Fever, aches, SOB, abdominal pain, N/V, melena, BRBPR, was found to have CAP, legionella infection, probable gastritis (from NSAIDS) and progressive hypoxic resp failure. Developed severe ARDS requiring paralysis and proning. Currently further plan is continue Antibiotics, monitor renal function, and oral intake.  Subjective: No abdominal pain, no nausea no vomiting.  Still has loose BM.  No fever no chills overnight.  Breathing is okay.  Assessment and Plan: 1.  Acute hypoxic respiratory failure Community-acquired pneumonia secondary to Legionella ARDS Sepsis with septic shock currently resolved, present on admission. Acute toxic and metabolic encephalopathy. Presents with cough and shortness of breath.  Initially admitted to the floor later on transferred to ICU requiring intubation. Patient developed ARDS. Hypoxia was so severe that patient required proning Currently on room air. Initially treated with IV ceftriaxone azithromycin followed by IV Zosyn and vancomycin followed by IV Levaquin. Leukocytosis is normalized. We will complete course of antibiotic recommended by PCCM. So far cultures are negative. Only positive findings is Legionella. Required IV pressors in the ICU currently resolved.  Patient was also on IV stress dose steroids in the ICU.  Currently stop.  2.  Acute kidney injury. Mixed anion gap and non-anion gap metabolic and respiratory acidosis. Mixture of prerenal etiology, ATN and hemodynamic disturbances. Nephrology was consulted. Patient required CRRT in the ICU. Currently requiring intermittent HD. Has temporary dialysis catheter in right IJ. Urine  output is improving. Monitor.  3.  Multifactorial acute metabolic and toxic encephalopathy. Likely hospital/ICU induced delirium, uremia, hypoxic injury, medications induced. Also appears to have critical illness weakness. Alert awake and oriented x3 right now but not following commands consistently. Monitor for now. Speech therapy consulted for cognitive evaluation along with swallowing evaluation.  4.  Dysphagia. Speech therapy consult for swallowing. Currently n.p.o. Core track inserted for nutrition. Continue Nepro. Underwent modified barium swallow eval which suggested high aspiration risk and recommendation remains for n.p.o. for now.  5. Anemia,  multifactorial secondary to acute blood loss secondary to GI bleeding, critical illness with sepsis and renal disease Continue to monitor H&H. Transfuse for hemoglobin less than 7. Continue PPI twice a day. Transition to per tube tomorrow. Since the patient does not appear to have any active GI bleeding currently GI was not consulted.  6.  A. fib with RVR. Suspecting A. fib was paroxysmal currently sinus rhythm. Patient was on amiodarone for rate control. Anticoagulation was deferred secondary to GI bleed. Suspect that the patient will likely not need anticoagulation going forward if he does not have any further episodes of A. fib.  7.  LFT elevation. Currently stable.  8.  Bilateral leg pain. CK normal. Etiology not clear. Currently no pain. Continue Percocet.  9.  Diarrhea. Likely secondary to tube feeding. Also secondary to antibiotics use. No abdominal pain, abdominal distention, nausea vomiting, leukocytosis. Does not appear to be C. difficile. We will continue scheduled Imodium and monitor. Continue rectal tube.  10.  Left upper extremity pain. Upper extremity basilic SVT Mild edema noted. Currently not at risk for further worsening of DVT.  And risk for bleeding  versus risk for clot progression higher  secondary to his recent upper GI bleed. Therefore will hold  off on anticoagulation. Treat with warm compress.  Pressure Injury 02/18/18 Unstageable - Full thickness tissue loss in which the base of the ulcer is covered by slough (yellow, tan, gray, green or brown) and/or eschar (tan, brown or black) in the wound bed. (Active)  02/18/18 2000  Location: Penis  Location Orientation: Mid;Upper;Lower  Staging: Unstageable - Full thickness tissue loss in which the base of the ulcer is covered by slough (yellow, tan, gray, green or brown) and/or eschar (tan, brown or black) in the wound bed.  Wound Description (Comments):   Present on Admission: No     Diet: tube feed DVT Prophylaxis: mechanical compression device  Advance goals of care discussion: full code  Family Communication: family was present at bedside, at the time of interview. The pt provided permission to discuss medical plan with the family. Opportunity was given to ask question and all questions were answered satisfactorily.   Disposition:  Discharge to be determined.  Consultants: Nephrology, PCCM Procedures:  OETT 12/9 >> 12/18 Art line 12/9 >> 12/17 Lt IJ CVL 12/9 >>  Rt IJ HD cath 12/11  Scheduled Meds: . ALPRAZolam  0.25 mg Oral Once  . bacitracin   Topical BID  . Chlorhexidine Gluconate Cloth  6 each Topical Q0600  . darbepoetin (ARANESP) injection - NON-DIALYSIS  100 mcg Subcutaneous Q Tue-1800  . guaiFENesin  10 mL Per Tube TID  . heparin injection (subcutaneous)  5,000 Units Subcutaneous Q8H  . Influenza vac split quadrivalent PF  0.5 mL Intramuscular Tomorrow-1000  . levalbuterol  0.63 mg Nebulization Q8H  . [START ON 02/25/2018] pantoprazole sodium  40 mg Per Tube Daily  . sodium chloride flush  10-40 mL Intracatheter Q12H   Continuous Infusions: . sodium chloride Stopped (02/21/18 1536)  . feeding supplement (NEPRO CARB STEADY) 50 mL/hr at 02/24/18 0255   PRN Meds: sodium chloride, oxyCODONE **AND**  acetaminophen, acetaminophen, [DISCONTINUED] ondansetron **OR** ondansetron (ZOFRAN) IV, sodium chloride flush, traZODone Antibiotics: Anti-infectives (From admission, onward)   Start     Dose/Rate Route Frequency Ordered Stop   02/19/18 1000  levofloxacin (LEVAQUIN) IVPB 500 mg     500 mg 100 mL/hr over 60 Minutes Intravenous Every 48 hours 02/17/18 1106 02/23/18 1859   02/14/18 0900  Levofloxacin (LEVAQUIN) IVPB 250 mg  Status:  Discontinued     250 mg 50 mL/hr over 60 Minutes Intravenous Every 24 hours 02/13/18 0815 02/17/18 1106   02/13/18 0830  levofloxacin (LEVAQUIN) IVPB 500 mg     500 mg 100 mL/hr over 60 Minutes Intravenous  Once 02/13/18 0815 02/13/18 1150   02/12/18 2000  vancomycin (VANCOCIN) 1,500 mg in sodium chloride 0.9 % 500 mL IVPB  Status:  Discontinued     1,500 mg 250 mL/hr over 120 Minutes Intravenous Every 24 hours 02/11/18 1406 02/11/18 1521   02/12/18 2000  piperacillin-tazobactam (ZOSYN) IVPB 3.375 g  Status:  Discontinued     3.375 g 100 mL/hr over 30 Minutes Intravenous Every 6 hours 02/12/18 1408 02/13/18 0815   02/10/18 2000  vancomycin (VANCOCIN) 1,250 mg in sodium chloride 0.9 % 250 mL IVPB  Status:  Discontinued     1,250 mg 166.7 mL/hr over 90 Minutes Intravenous Every 24 hours 02/09/18 1839 02/10/18 0728   02/10/18 1530  vancomycin (VANCOCIN) IVPB 750 mg/150 ml premix  Status:  Discontinued     750 mg 150 mL/hr over 60 Minutes Intravenous Every 12 hours 02/10/18 1432 02/11/18 1406   02/10/18 1400  piperacillin-tazobactam (ZOSYN) IVPB 3.375 g  Status:  Discontinued     3.375 g 12.5 mL/hr over 240 Minutes Intravenous Every 8 hours 02/10/18 0731 02/12/18 1408   02/10/18 0745  piperacillin-tazobactam (ZOSYN) IVPB 3.375 g     3.375 g 100 mL/hr over 30 Minutes Intravenous  Once 02/10/18 0728 02/10/18 0921   02/10/18 0600  ceFEPIme (MAXIPIME) 2 g in sodium chloride 0.9 % 100 mL IVPB  Status:  Discontinued     2 g 200 mL/hr over 30 Minutes Intravenous Every  12 hours 02/09/18 1839 02/09/18 2014   02/10/18 0200  cefTRIAXone (ROCEPHIN) 1 g in sodium chloride 0.9 % 100 mL IVPB  Status:  Discontinued     1 g 200 mL/hr over 30 Minutes Intravenous Every 24 hours 02/09/18 2014 02/10/18 0728   02/09/18 2015  azithromycin (ZITHROMAX) 500 mg in sodium chloride 0.9 % 250 mL IVPB  Status:  Discontinued     500 mg 250 mL/hr over 60 Minutes Intravenous Every 24 hours 02/09/18 2014 02/12/18 1306   02/09/18 1830  vancomycin (VANCOCIN) 1,250 mg in sodium chloride 0.9 % 250 mL IVPB     1,250 mg 166.7 mL/hr over 90 Minutes Intravenous  Once 02/09/18 1800 02/09/18 2127   02/09/18 1800  ceFEPIme (MAXIPIME) 2 g in sodium chloride 0.9 % 100 mL IVPB     2 g 200 mL/hr over 30 Minutes Intravenous  Once 02/09/18 1745 02/09/18 1925   02/09/18 1800  metroNIDAZOLE (FLAGYL) IVPB 500 mg  Status:  Discontinued     500 mg 100 mL/hr over 60 Minutes Intravenous Every 8 hours 02/09/18 1745 02/10/18 0728   02/09/18 1800  vancomycin (VANCOCIN) IVPB 1000 mg/200 mL premix  Status:  Discontinued     1,000 mg 200 mL/hr over 60 Minutes Intravenous  Once 02/09/18 1745 02/09/18 1800       Objective: Physical Exam: Vitals:   02/24/18 0500 02/24/18 0600 02/24/18 0743 02/24/18 1431  BP:   126/69   Pulse: (!) 52 86 92   Resp: 20 16 (!) 23   Temp:  98 F (36.7 C) 99.1 F (37.3 C)   TempSrc:  Oral Axillary   SpO2: 99% 98% 98% 96%  Weight: 65.7 kg     Height:        Intake/Output Summary (Last 24 hours) at 02/24/2018 1642 Last data filed at 02/24/2018 1547 Gross per 24 hour  Intake 1790 ml  Output 900 ml  Net 890 ml   Filed Weights   02/23/18 0710 02/23/18 1119 02/24/18 0500  Weight: 66 kg 63.7 kg 65.7 kg   General: Alert, Awake and Oriented to Time, Place and Person. Appear in mild distress, affect appropriate Eyes: PERRL, Conjunctiva normal ENT: Oral Mucosa clear moist. Neck: no JVD, no Abnormal Mass Or lumps Cardiovascular: S1 and S2 Present, no Murmur, Peripheral  Pulses Present Respiratory: normal respiratory effort, Bilateral Air entry equal and Decreased, no use of accessory muscle, Clear to Auscultation, no Crackles, no wheezes Abdomen: Bowel Sound present, Soft and no tenderness, no hernia Skin: no redness, on Rash, no induration Extremities: bilateral trace Pedal edema, no calf tenderness Neurologic: Grossly no focal neuro deficit. Bilaterally Equal motor strength although 4/5  Data Reviewed: CBC: Recent Labs  Lab 02/19/18 0325 02/20/18 0435 02/21/18 0512 02/22/18 0631 02/23/18 0642  WBC 9.3 10.2 8.9 9.0 10.0  HGB 7.5* 7.6* 7.5* 7.4* 7.4*  HCT 22.3* 23.3* 22.9* 23.4* 23.3*  MCV 91.8 92.1 93.9 93.6 94.3  PLT 241 PLATELET CLUMPS NOTED ON SMEAR, UNABLE TO ESTIMATE  244 209 230   Basic Metabolic Panel: Recent Labs  Lab 02/19/18 0325 02/20/18 0435 02/20/18 2116 02/21/18 0500 02/21/18 0512 02/22/18 0631 02/23/18 0642 02/24/18 0559  NA 137 138 136 137  --  136 138 137  K 3.9 3.1* 3.9 4.0  --  3.9 4.4 4.4  CL 105 102 98 99  --  98 99 96*  CO2 16* 22 24 24   --  24 23 27   GLUCOSE 140* 99 85 87  --  116* 107* 105*  BUN 157* 85* 33* 44*  --  75* 99* 46*  CREATININE 5.66* 4.43* 2.70* 3.95*  --  6.30* 8.02* 4.95*  CALCIUM 7.8* 7.7* 7.6* 7.8*  --  8.0* 8.1* 8.0*  MG 3.0*  --   --   --  2.1 2.2  --   --   PHOS 7.5* 5.5*  --  5.5*  --  6.9* 7.9* 5.6*    Liver Function Tests: Recent Labs  Lab 02/20/18 0435 02/21/18 0500 02/22/18 0631 02/23/18 0642 02/24/18 0559  AST  --   --  33  --   --   ALT  --   --  61*  --   --   ALKPHOS  --   --  117  --   --   BILITOT  --   --  0.4  --   --   PROT  --   --  5.7*  --   --   ALBUMIN 1.6* 1.7* 1.6* 1.6* 1.7*   No results for input(s): LIPASE, AMYLASE in the last 168 hours. No results for input(s): AMMONIA in the last 168 hours. Coagulation Profile: No results for input(s): INR, PROTIME in the last 168 hours. Cardiac Enzymes: Recent Labs  Lab 02/21/18 1120  CKTOTAL 38*   BNP (last 3  results) No results for input(s): PROBNP in the last 8760 hours. CBG: Recent Labs  Lab 02/23/18 1205 02/23/18 1740 02/23/18 1937 02/24/18 0533 02/24/18 0744  GLUCAP 109* 94 102* 96 101*   Studies: Dg Swallowing Func-speech Pathology  Result Date: 02/24/2018 Objective Swallowing Evaluation: Type of Study: MBS-Modified Barium Swallow Study  Patient Details Name: Roberto Knapp MRN: [id number] Date of Birth: [birth year] Today's Date: 02/24/2018 Time: SLP Start Time (ACUTE ONLY): 1127 -SLP Stop Time (ACUTE ONLY): 1156 SLP Time Calculation (min) (ACUTE ONLY): 29 min Past Medical History: Past Medical History: Diagnosis Date . Drug overdose, intentional [facility])  Past Surgical History: Past Surgical History: Procedure Laterality Date . FASCIOTOMY  09/08/2011  Procedure: FASCIOTOMY;  Surgeon: Sherren Kerns, MD;  Location: [facility] OR;  Service: Vascular;  Laterality: Left; . FEMORAL-POPLITEAL BYPASS GRAFT  09/08/2011  Procedure: BYPASS GRAFT FEMORAL-POPLITEAL ARTERY;  Surgeon: Sherren Kerns, MD;  Location: [facility] OR;  Service: Vascular;  Laterality: Left; . NO PAST SURGERIES   HPI: 47 yo male admitted 02/09/18 w/ CAP, legionella infection, probable gastritis (from NSAIDS) and progressive hypoxic resp failure. Developed severe ARDS requiring ETT 12/9-12/18, paralysis, and proning. PMH: intentional overdose. CXR = Diffuse right lung airspace disease and left lower lobe airspace opacity, worsened since last CXR  Subjective: pt pleasant, confused, makes jokes when he does not seem to know an answer Assessment / Plan / Recommendation CHL IP CLINICAL IMPRESSIONS 02/24/2018 Clinical Impression Pt has mild, generalized oropharyngeal weakness that allows for reduced lingual propulsion orally but also with reduced base of tongue retraction, hyolaryngeal movement, and epiglottic inversion. His oral clearance is adequate and he has only trace to mild amounts of  vallecular residue. He has trace amounts of penetration during the swallow  with all consistencies tested except for honey thick liquids by tsp. The amount of other consistencies penetrated is very small, but unfortunately he cannot clear them with a cued cough, and even during testing they begin to fall to the true vocal folds without sensation. Suspect that across a meal tray, aspiration would be likely. Recommend SLP f/u for introduction of therapeutic trials of honey thick liquids by spoon and use of strengthening exercises (RMT?) to facilitate return to POs, with prognosis good given additional time post-extubation and improved strength of cough. SLP Visit Diagnosis Dysphagia, oropharyngeal phase (R13.12) Attention and concentration deficit following -- Frontal lobe and executive function deficit following -- Impact on safety and function Moderate aspiration risk   CHL IP TREATMENT RECOMMENDATION 02/24/2018 Treatment Recommendations Therapy as outlined in treatment plan below   Prognosis 02/24/2018 Prognosis for Safe Diet Advancement Good Barriers to Reach Goals Cognitive deficits Barriers/Prognosis Comment -- CHL IP DIET RECOMMENDATION 02/24/2018 SLP Diet Recommendations NPO;Alternative means - temporary Liquid Administration via -- Medication Administration Via alternative means Compensations -- Postural Changes --   CHL IP OTHER RECOMMENDATIONS 02/24/2018 Recommended Consults -- Oral Care Recommendations Oral care QID Other Recommendations --   CHL IP FOLLOW UP RECOMMENDATIONS 02/24/2018 Follow up Recommendations Skilled Nursing facility;[facility]   CHL IP FREQUENCY AND DURATION 02/24/2018 Speech Therapy Frequency (ACUTE ONLY) min 2x/week Treatment Duration 2 weeks      CHL IP ORAL PHASE 02/24/2018 Oral Phase Impaired Oral - Pudding Teaspoon -- Oral - Pudding Cup -- Oral - Honey Teaspoon Delayed oral transit;Weak lingual manipulation Oral - Honey Cup -- Oral - Nectar Teaspoon Delayed oral transit;Weak lingual manipulation Oral - Nectar Cup -- Oral - Nectar Straw -- Oral - Thin Teaspoon  Weak lingual manipulation Oral - Thin Cup Weak lingual manipulation Oral - Thin Straw -- Oral - Puree Weak lingual manipulation;Delayed oral transit Oral - Mech Soft -- Oral - Regular -- Oral - Multi-Consistency -- Oral - Pill -- Oral Phase - Comment --  CHL IP PHARYNGEAL PHASE 02/24/2018 Pharyngeal Phase Impaired Pharyngeal- Pudding Teaspoon -- Pharyngeal -- Pharyngeal- Pudding Cup -- Pharyngeal -- Pharyngeal- Honey Teaspoon Reduced epiglottic inversion;Reduced anterior laryngeal mobility;Reduced laryngeal elevation;Reduced tongue base retraction;Pharyngeal residue - valleculae Pharyngeal -- Pharyngeal- Honey Cup -- Pharyngeal -- Pharyngeal- Nectar Teaspoon Reduced epiglottic inversion;Reduced anterior laryngeal mobility;Reduced laryngeal elevation;Reduced tongue base retraction;Pharyngeal residue - valleculae;Penetration/Aspiration during swallow Pharyngeal Material enters airway, CONTACTS cords and not ejected out Pharyngeal- Nectar Cup -- Pharyngeal -- Pharyngeal- Nectar Straw -- Pharyngeal -- Pharyngeal- Thin Teaspoon Reduced epiglottic inversion;Reduced anterior laryngeal mobility;Reduced laryngeal elevation;Reduced tongue base retraction Pharyngeal -- Pharyngeal- Thin Cup Reduced epiglottic inversion;Reduced anterior laryngeal mobility;Reduced laryngeal elevation;Reduced tongue base retraction;Pharyngeal residue - valleculae;Penetration/Aspiration during swallow Pharyngeal Material enters airway, CONTACTS cords and not ejected out Pharyngeal- Thin Straw -- Pharyngeal -- Pharyngeal- Puree Reduced epiglottic inversion;Reduced anterior laryngeal mobility;Reduced laryngeal elevation;Reduced tongue base retraction;Pharyngeal residue - valleculae;Penetration/Aspiration during swallow Pharyngeal Material enters airway, remains ABOVE vocal cords and not ejected out Pharyngeal- Mechanical Soft -- Pharyngeal -- Pharyngeal- Regular -- Pharyngeal -- Pharyngeal- Multi-consistency -- Pharyngeal -- Pharyngeal- Pill --  Pharyngeal -- Pharyngeal Comment --  CHL IP CERVICAL ESOPHAGEAL PHASE 02/24/2018 Cervical Esophageal Phase WFL Pudding Teaspoon -- Pudding Cup -- Honey Teaspoon -- Honey Cup -- Nectar Teaspoon -- Nectar Cup -- Nectar Straw -- Thin Teaspoon -- Thin Cup -- Thin Straw -- Puree -- Mechanical Soft -- Regular -- Multi-consistency -- Pill -- Cervical Esophageal Comment --  Maxcine Ham 02/24/2018, 1:23 PM  Maxcine Ham, M.A. CCC-SLP Acute Rehabilitation Services Pager [phone] Office [phone]             Vas [place] Upper Extremity Venous Duplex  Result Date: 02/24/2018 UPPER VENOUS STUDY  Indications: Pain Performing Technologist: Jeb Levering RDMS, RVT  Examination Guidelines: A complete evaluation includes B-mode imaging, spectral Doppler, color Doppler, and power Doppler as needed of all accessible portions of each vessel. Bilateral testing is considered an integral part of a complete examination. Limited examinations for reoccurring indications may be performed as noted.  Right Findings: +----------+------------+----------+---------+-----------+-------+ RIGHT     CompressiblePropertiesPhasicitySpontaneousSummary +----------+------------+----------+---------+-----------+-------+ Subclavian                         Yes       Yes            +----------+------------+----------+---------+-----------+-------+  Left Findings: +----------+------------+----------+---------+-----------+-------+ LEFT      CompressiblePropertiesPhasicitySpontaneousSummary +----------+------------+----------+---------+-----------+-------+ IJV           Full                 Yes       Yes            +----------+------------+----------+---------+-----------+-------+ Subclavian    Full                 Yes       Yes            +----------+------------+----------+---------+-----------+-------+ Axillary      Full                 Yes       Yes             +----------+------------+----------+---------+-----------+-------+ Brachial      Full                 Yes       Yes            +----------+------------+----------+---------+-----------+-------+ Radial        Full                                          +----------+------------+----------+---------+-----------+-------+ Ulnar         Full                                          +----------+------------+----------+---------+-----------+-------+ Cephalic      Full                                          +----------+------------+----------+---------+-----------+-------+ Basilic       None                                   Acute  +----------+------------+----------+---------+-----------+-------+  Summary:  Right: No evidence of thrombosis in the subclavian.  Left: No evidence of deep vein thrombosis in the upper extremity. Findings consistent with acute superficial vein thrombosis involving the left basilic vein.  *See table(s) above for measurements and observations.  Diagnosing physician: Gretta Began MD Electronically signed by Gretta Began MD on 02/24/2018 at 1:44:18 PM.  Final      Time spent: 35 minutes  Author: Lynden Oxford, MD [facility] Pager: [phone] 02/24/2018 4:42 PM  Between 7PM-7AM, please contact night-coverage at [email or web address], password [email or web address]
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
## 2018-02-24 NOTE — Progress Notes (Signed)
  Speech Language Pathology Treatment: Dysphagia  Patient Details Name: Roberto Knapp MRN: [id number] DOB: 04-Jul-1970 Today's Date: 02/24/2018 Time: [id number] SLP Time Calculation (min) (ACUTE ONLY): 16 min  Assessment / Plan / Recommendation Clinical Impression  Pt has multiple swallows and a delayed cough with ice chips, although his voice does not sound wet and he does not need cues for labial seal as was documented during last SLP visit. He cough, particularly his volitional cough, appears to be weak. Pt and his wife feel he is ready to attempt MBS today. Discussed with them that while his endurance may not be sufficient for full meal trays, MBS early on can be helpful for determining a treatment plan and if there are any therapeutic boluses that would be safe to try. MBS scheduled for later this morning.   HPI HPI: 47 yo male admitted 02/09/18 w/ CAP, legionella infection, probable gastritis (from NSAIDS) and progressive hypoxic resp failure. Developed severe ARDS requiring ETT 12/9-12/18, paralysis, and proning. PMH: intentional overdose. CXR = Diffuse right lung airspace disease and left lower lobe airspace opacity, worsened since last CXR      SLP Plan  MBS       Recommendations  Diet recommendations: NPO Medication Administration: Via alternative means Supervision: Patient able to self feed                Oral Care Recommendations: Oral care QID Follow up Recommendations: Skilled Nursing facility;LTACH SLP Visit Diagnosis: Dysphagia, unspecified (R13.10) Plan: MBS       GO                Maxcine Hamaiewonsky, Keithon Mccoin 02/24/2018, 9:49 AM  Maxcine HamLaura Paiewonsky, M.A. CCC-SLP Acute Herbalistehabilitation Services Pager [phone] Office [phone]

## 2018-02-24 NOTE — Plan of Care (Signed)
Discussed with patient and wife plan of care for the evening, pain management and clustering care throughout the night and not disturbing him at midnight for task with some teach back displayed

## 2018-02-24 NOTE — Progress Notes (Signed)
[facility] ROUNDING NOTE   Subjective:   Appears to be stable this morning.  Nondistressed.  Dialysis 02/23/2018  Removal of 2 L with dialysis 1.15 L of urine output 02/23/2018  Blood pressure 126/69 pulse sat 92 temperature 99.1 O2 sats 98% 5 L nasal cannula  Chest x-ray showed slightly worsening bilateral patchy airspace disease right worse than left multifocal infection thought to be likely 02/22/2018  Sodium 137 potassium 4.4 chloride 96 CO2 27 glucose 105 BUN 46 creatinine 4.95 calcium 8.0 phosphorus 5.6 Albumin 1.7 WBC 10.0 hemoglobin 7.4 platelets 230   Objective:  Vital signs in last 24 hours:  Temp:  [98 F (36.7 C)-100 F (37.8 C)] 99.1 F (37.3 C) (12/23 0743) Pulse Rate:  [52-109] 92 (12/23 0743) Resp:  [16-26] 23 (12/23 0743) BP: (112-126)/(68-85) 126/69 (12/23 0743) SpO2:  [88 %-100 %] 98 % (12/23 0743) Weight:  [63.7 kg-65.7 kg] 65.7 kg (12/23 0500)  Weight change:  Filed Weights   02/23/18 0710 02/23/18 1119 02/24/18 0500  Weight: 66 kg 63.7 kg 65.7 kg    Intake/Output: I/O last 3 completed shifts: In: 2535 [I.V.:40; Other:335; NG/GT:2160] Out: 0211 [Urine:1150; Other:2000; Stool:500]   Intake/Output this shift:  No intake/output data recorded. Alert awake and oriented mucosa moist CVS- RRR JVP not elevated RS- CTA diminished air entry no crackles no wheezes ABD- BS present soft non-distended EXT-bilateral trace peripheral edema   Basic Metabolic Panel: Recent Labs  Lab 02/19/18 0325 02/20/18 0435 02/20/18 2116 02/21/18 0500 02/21/18 0512 02/22/18 0631 02/23/18 0642 02/24/18 0559  NA 137 138 136 137  --  136 138 137  K 3.9 3.1* 3.9 4.0  --  3.9 4.4 4.4  CL 105 102 98 99  --  98 99 96*  CO2 16* _0 --  _1 GLUCOSE 140* 99 85 87  --  116* 107* 105*  BUN 157* 85* 33* 44*  --  75* 99* 46*  CREATININE 5.66* 4.43* 2.70* 3.95*  --  6.30* 8.02* 4.95*  CALCIUM 7.8* 7.7* 7.6* 7.8*  --  8.0* 8.1* 8.0*  MG 3.0*  --    --   --  2.1 2.2  --   --   PHOS 7.5* 5.5*  --  5.5*  --  6.9* 7.9* 5.6*    Liver Function Tests: Recent Labs  Lab 02/20/18 0435 02/21/18 0500 02/22/18 0631 02/23/18 0642 02/24/18 0559  AST  --   --  33  --   --   ALT  --   --  61*  --   --   ALKPHOS  --   --  117  --   --   BILITOT  --   --  0.4  --   --   PROT  --   --  5.7*  --   --   ALBUMIN 1.6* 1.7* 1.6* 1.6* 1.7*   No results for input(s): LIPASE, AMYLASE in the last 168 hours. No results for input(s): AMMONIA in the last 168 hours.  CBC: Recent Labs  Lab 02/19/18 0325 02/20/18 0435 02/21/18 0512 02/22/18 0631 02/23/18 0642  WBC 9.3 10.2 8.9 9.0 10.0  HGB 7.5* 7.6* 7.5* 7.4* 7.4*  HCT 22.3* 23.3* 22.9* 23.4* 23.3*  MCV 91.8 92.1 93.9 93.6 94.3  PLT 241 PLATELET CLUMPS NOTED ON SMEAR, UNABLE TO ESTIMATE 244 209 230    Cardiac Enzymes: Recent Labs  Lab 02/21/18 1120  CKTOTAL 38*    BNP: Invalid input(s): POCBNP  CBG: Recent Labs  Lab 02/22/18 2023 02/23/18 1740 02/23/18 1937 02/24/18 0533 02/24/18 0744  GLUCAP 108* 94 102* 96 101*    Microbiology: Results for orders placed or performed during the hospital encounter of 02/09/18  Culture, blood (routine x 2)     Status: None   Collection Time: 02/09/18  5:30 PM  Result Value Ref Range Status   Specimen Description BLOOD LEFT ANTECUBITAL  Final   Special Requests   Final    BOTTLES DRAWN AEROBIC AND ANAEROBIC Blood Culture adequate volume   Culture   Final    NO GROWTH 5 DAYS Performed at [facility], [place] [address], [place], [place] [address]    Report Status 02/14/2018 FINAL  Final  Urine culture     Status: None   Collection Time: 02/09/18  6:16 PM  Result Value Ref Range Status   Specimen Description URINE, RANDOM  Final   Special Requests NONE  Final   Culture   Final    NO GROWTH Performed at [facility], [place] [address], [place], [place] [address]    Report Status 02/10/2018 FINAL  Final  Culture, blood (routine x  2)     Status: None   Collection Time: 02/09/18  6:16 PM  Result Value Ref Range Status   Specimen Description BLOOD BLOOD RIGHT WRIST  Final   Special Requests   Final    BOTTLES DRAWN AEROBIC AND ANAEROBIC Blood Culture adequate volume   Culture   Final    NO GROWTH 5 DAYS Performed at [facility], [place] [address] [place], [place] [address]    Report Status 02/14/2018 FINAL  Final  Gastrointestinal Panel by PCR , Stool     Status: None   Collection Time: 02/09/18  6:36 PM  Result Value Ref Range Status   Campylobacter species NOT DETECTED NOT DETECTED Final   Plesimonas shigelloides NOT DETECTED NOT DETECTED Final   Salmonella species NOT DETECTED NOT DETECTED Final   Yersinia enterocolitica NOT DETECTED NOT DETECTED Final   Vibrio species NOT DETECTED NOT DETECTED Final   Vibrio cholerae NOT DETECTED NOT DETECTED Final   Enteroaggregative E coli (EAEC) NOT DETECTED NOT DETECTED Final   Enteropathogenic E coli (EPEC) NOT DETECTED NOT DETECTED Final   Enterotoxigenic E coli (ETEC) NOT DETECTED NOT DETECTED Final   Shiga like toxin producing E coli (STEC) NOT DETECTED NOT DETECTED Final   Shigella/Enteroinvasive E coli (EIEC) NOT DETECTED NOT DETECTED Final   Cryptosporidium NOT DETECTED NOT DETECTED Final   Cyclospora cayetanensis NOT DETECTED NOT DETECTED Final   Entamoeba histolytica NOT DETECTED NOT DETECTED Final   Giardia lamblia NOT DETECTED NOT DETECTED Final   Adenovirus F40/41 NOT DETECTED NOT DETECTED Final   Astrovirus NOT DETECTED NOT DETECTED Final   Norovirus GI/GII NOT DETECTED NOT DETECTED Final   Rotavirus A NOT DETECTED NOT DETECTED Final   Sapovirus (I, II, IV, and V) NOT DETECTED NOT DETECTED Final    Comment: Performed at [facility], [address] [place], [place] [address]  C difficile quick scan w PCR reflex     Status: None   Collection Time: 02/09/18  6:36 PM  Result Value Ref Range Status   C Diff antigen NEGATIVE NEGATIVE Final    C Diff toxin NEGATIVE NEGATIVE Final   C Diff interpretation No C. difficile detected.  Final    Comment: Performed at [facility], [place] [address] [place], [place] [address]  MRSA PCR Screening     Status: None   Collection Time: 02/10/18  1:34 AM  Result Value Ref Range Status   MRSA by PCR NEGATIVE NEGATIVE Final    Comment:        The GeneXpert MRSA Assay (FDA approved for NASAL specimens only), is one component of a comprehensive MRSA colonization surveillance program. It is not intended to diagnose MRSA infection nor to guide or monitor treatment for MRSA infections. Performed at [facility], [place] [address] [place], [place] [address]   Culture, bal-quantitative     Status: None   Collection Time: 02/10/18  1:26 PM  Result Value Ref Range Status   Specimen Description BRONCHIAL ALVEOLAR LAVAGE  Final   Special Requests Normal  Final   Gram Stain   Final    FEW WBC PRESENT,BOTH PMN AND MONONUCLEAR NO ORGANISMS SEEN    Culture   Final    NO GROWTH 2 DAYS Performed at [facility], [place] [address], [place], [place] [address]    Report Status 02/12/2018 FINAL  Final  Pneumocystis smear by DFA     Status: None   Collection Time: 02/10/18  1:26 PM  Result Value Ref Range Status   Specimen Source-PJSRC BRONCHIAL ALVEOLAR LAVAGE  Final   Pneumocystis jiroveci Ag NEGATIVE  Final    Comment: Performed at [facility] Performed at [facility], [address] [place], [place] [address]     Coagulation Studies: No results for input(s): LABPROT, INR in the last 72 hours.  Urinalysis: No results for input(s): COLORURINE, LABSPEC, PHURINE, GLUCOSEU, HGBUR, BILIRUBINUR, KETONESUR, PROTEINUR, UROBILINOGEN, NITRITE, LEUKOCYTESUR in the last 72 hours.  Invalid input(s): APPERANCEUR    Imaging: No results found.   Medications:   . sodium chloride Stopped (02/21/18 1536)  . feeding supplement (NEPRO CARB STEADY) 50 mL/hr at 02/24/18 0255    . ALPRAZolam  0.25 mg Oral Once  . bacitracin   Topical BID  . Chlorhexidine Gluconate Cloth  6 each Topical Q0600  . darbepoetin (ARANESP) injection - NON-DIALYSIS  100 mcg Subcutaneous Q Tue-1800  . guaiFENesin  10 mL Per Tube TID  . heparin injection (subcutaneous)  5,000 Units Subcutaneous Q8H  . Influenza vac split quadrivalent PF  0.5 mL Intramuscular Tomorrow-1000  . levalbuterol  0.63 mg Nebulization Q8H  . loperamide HCl  2 mg Oral Daily  . pantoprazole (PROTONIX) IV  40 mg Intravenous Q12H  . sodium chloride flush  10-40 mL Intracatheter Q12H   sodium chloride, oxyCODONE **AND** acetaminophen, acetaminophen, [DISCONTINUED] ondansetron **OR** ondansetron (ZOFRAN) IV, sodium chloride flush, traZODone  Assessment/ Plan:   Acute kidney injury in setting of ATN recent history of nonsteroidal anti-inflammatory drugs and IV contrast was started on CRRT 02/12/2018 to 02/17/2018 and started on intermittent hemodialysis 02/19/2018, 02/20/2018 and 02/23/2018.  Vas-Cath appears to have been in place for 11 days.  Urine output appears to be improving continue to monitor no dialysis planned 02/25/2018  Hypertension/volume appears to be stable at this point will continue to follow appears euvolemic  Anemia with darbepoetin 100 mcg 02/18/2018    Acute hypoxic respiratory failure secondary to Legionella ARDS.  Mated with septic shock.  Continues on Levaquin.  Toxic encephalopathy appears to have improved  Atrial fibrillation with rapid ventricular rate anticoagulation deferred.  No further episodes of atrial fibrillation noted patient now in sinus rhythm  Diarrhea with rectal tube does not appear to be secondary to C. Difficile  Electrolytes appear  to be stable  Acid-base status stable.    LOS: [place] _0 _1 :33 AM

## 2018-02-25 ENCOUNTER — Inpatient Hospital Stay (HOSPITAL_COMMUNITY): Payer: BLUE CROSS/BLUE SHIELD

## 2018-02-25 LAB — GLUCOSE, CAPILLARY
Glucose-Capillary: 100 mg/dL — ABNORMAL HIGH (ref 70–99)
Glucose-Capillary: 102 mg/dL — ABNORMAL HIGH (ref 70–99)
Glucose-Capillary: 104 mg/dL — ABNORMAL HIGH (ref 70–99)
Glucose-Capillary: 126 mg/dL — ABNORMAL HIGH (ref 70–99)
Glucose-Capillary: 99 mg/dL (ref 70–99)

## 2018-02-25 LAB — CBC
HCT: 22.4 % — ABNORMAL LOW (ref 39.0–52.0)
Hemoglobin: 7.2 g/dL — ABNORMAL LOW (ref 13.0–17.0)
MCH: 30.3 pg (ref 26.0–34.0)
MCHC: 32.1 g/dL (ref 30.0–36.0)
MCV: 94.1 fL (ref 80.0–100.0)
Platelets: DECREASED 10*3/uL (ref 150–400)
RBC: 2.38 MIL/uL — ABNORMAL LOW (ref 4.22–5.81)
RDW: 15.4 % (ref 11.5–15.5)
WBC: 13 10*3/uL — ABNORMAL HIGH (ref 4.0–10.5)
nRBC: 0 % (ref 0.0–0.2)

## 2018-02-25 LAB — RENAL FUNCTION PANEL
Albumin: 1.7 g/dL — ABNORMAL LOW (ref 3.5–5.0)
Anion gap: 15 (ref 5–15)
BUN: 68 mg/dL — ABNORMAL HIGH (ref 6–20)
CO2: 26 mmol/L (ref 22–32)
Calcium: 7.8 mg/dL — ABNORMAL LOW (ref 8.9–10.3)
Chloride: 91 mmol/L — ABNORMAL LOW (ref 98–111)
Creatinine, Ser: 6.72 mg/dL — ABNORMAL HIGH (ref 0.61–1.24)
GFR calc Af Amer: 10 mL/min — ABNORMAL LOW (ref >60)
GFR calc non Af Amer: 9 mL/min — ABNORMAL LOW (ref >60)
Glucose, Bld: 102 mg/dL — ABNORMAL HIGH (ref 70–99)
Phosphorus: 8 mg/dL — ABNORMAL HIGH (ref 2.5–4.6)
Potassium: 4.6 mmol/L (ref 3.5–5.1)
Sodium: 132 mmol/L — ABNORMAL LOW (ref 135–145)

## 2018-02-25 IMAGING — DX DG ABD PORTABLE 1V
1 series · 1 of 1 positions shown · non-contrast
Comparison: [DATE]

CLINICAL DATA: Nausea

EXAM:
PORTABLE ABDOMEN - 1 VIEW

[abdomen kub]
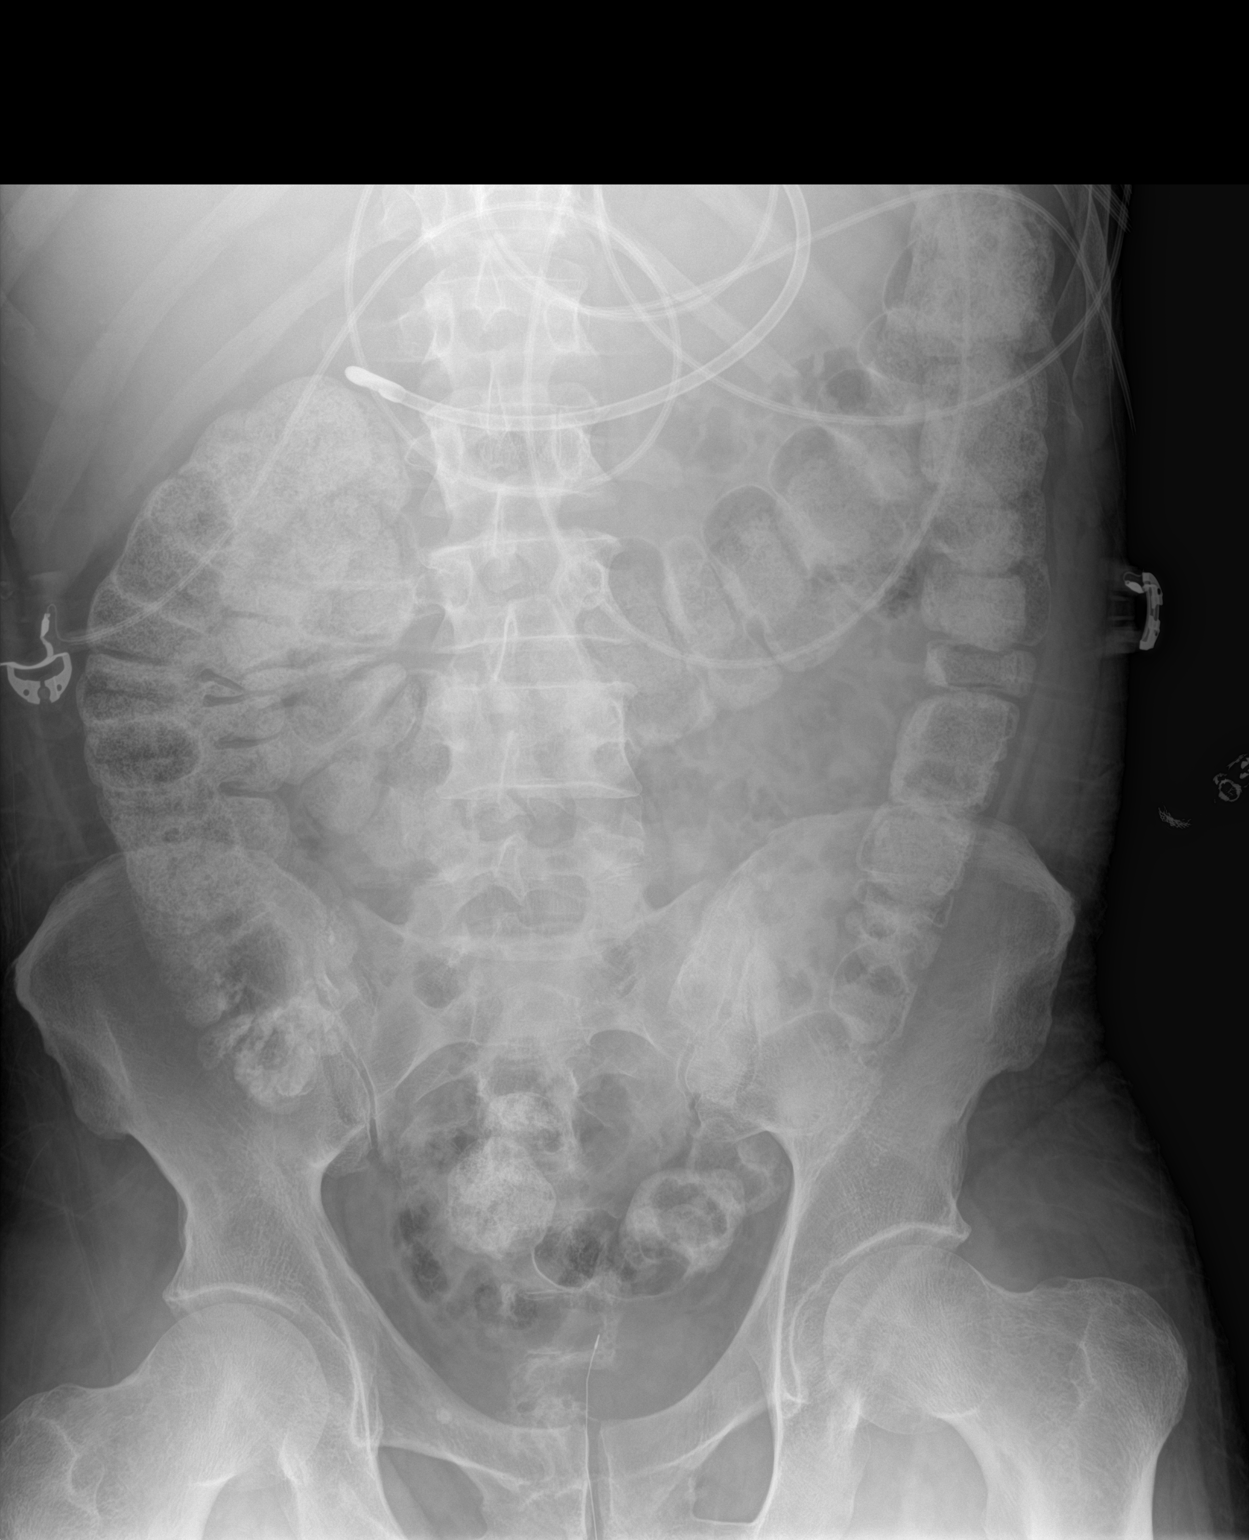

[1 of 1 positions shown; findings below may reference images not displayed]

FINDINGS: Contrast material is noted throughout colon consistent with the
recent modified barium swallow. No obstructive changes are seen.
Feeding catheter is noted within the distal stomach. No bony
abnormality is seen.
IMPRESSION: No acute abnormality noted.

## 2018-02-25 MED ORDER — SODIUM CHLORIDE 0.9 % IV SOLN: 100.0000 mL | INTRAVENOUS | Status: DC | PRN

## 2018-02-25 MED ORDER — PENTAFLUOROPROP-TETRAFLUOROETH EX AERO: 1.0000 "application " | INHALATION_SPRAY | CUTANEOUS | Status: DC | PRN

## 2018-02-25 MED ORDER — HEPARIN SODIUM (PORCINE) 1000 UNIT/ML IJ SOLN
INTRAMUSCULAR | Status: AC
  Administered 2018-02-25: 2400 [IU] via INTRAVENOUS_CENTRAL
  Filled 2018-02-25: qty 3

## 2018-02-25 MED ORDER — CHLORHEXIDINE GLUCONATE CLOTH 2 % EX PADS
6.0000 | MEDICATED_PAD | Freq: Every day | CUTANEOUS | Status: DC
  Administered 2018-02-25 – 2018-02-28 (×3): 6 via TOPICAL

## 2018-02-25 MED ORDER — ALTEPLASE 2 MG IJ SOLR: 2.0000 mg | Freq: Once | INTRAMUSCULAR | Status: DC | PRN

## 2018-02-25 MED ORDER — LIDOCAINE-PRILOCAINE 2.5-2.5 % EX CREA: 1.0000 "application " | TOPICAL_CREAM | CUTANEOUS | Status: DC | PRN

## 2018-02-25 MED ORDER — HEPARIN SODIUM (PORCINE) 1000 UNIT/ML DIALYSIS
1000.0000 [IU] | INTRAMUSCULAR | Status: DC | PRN
  Administered 2018-02-25: 2400 [IU] via INTRAVENOUS_CENTRAL
  Filled 2018-02-25: qty 1

## 2018-02-25 MED ORDER — LIDOCAINE HCL (PF) 1 % IJ SOLN: 5.0000 mL | INTRAMUSCULAR | Status: DC | PRN

## 2018-02-25 NOTE — Progress Notes (Signed)
  Speech Language Pathology Treatment: Dysphagia  Patient Details Name: Roberto Knapp MRN: [id number] DOB: 1970-12-03 Today's Date: 02/25/2018 Time: [id number] SLP Time Calculation (min) (ACUTE ONLY): 14 min  Assessment / Plan / Recommendation Clinical Impression  Treatment was limited by pt nausea. He was agreeable initially to repositioning and two spoonfuls of honey thick juice, but then said he could not have any additional boluses. RN made aware. SLP demonstrated effortful swallow and CTAR but pt was only able to perform one of each exercise at the moment. SLP will return as schedule allows and when pt is feeling better in order to introduce additional exercises (RMT likely to be beneficial) and therapeutic boluses.   HPI HPI: 47 yo male admitted 02/09/18 w/ CAP, legionella infection, probable gastritis (from NSAIDS) and progressive hypoxic resp failure. Developed severe ARDS requiring ETT 12/9-12/18, paralysis, and proning. PMH: intentional overdose. CXR = Diffuse right lung airspace disease and left lower lobe airspace opacity, worsened since last CXR      SLP Plan  Continue with current plan of care       Recommendations  Diet recommendations: NPO Medication Administration: Via alternative means                Oral Care Recommendations: Oral care QID Follow up Recommendations: Skilled Nursing facility;LTACH SLP Visit Diagnosis: Dysphagia, oropharyngeal phase (R13.12) Plan: Continue with current plan of care       GO                Maxcine Hamaiewonsky, Sahith Nurse 02/25/2018, 11:03 AM  Maxcine HamLaura Paiewonsky, M.A. CCC-SLP Acute Herbalistehabilitation Services Pager [phone] Office [phone]

## 2018-02-25 NOTE — Progress Notes (Signed)
[facility] ROUNDING NOTE   Subjective:   Appears to be stable this morning.  Nondistressed.  Dialysis 02/23/2018  Removal of 2 L with dialysis 02/23/2018    urine output appears to be increasing.  400 cc recorded 02/24/2018.  Blood pressure 105/66 pulse 84 temperature 98.1  Sodium 132 potassium 4.6 chloride 91 CO2 2026 glucose 102 BUN 68 creatinine 6.72 calcium 7.8 phosphorus 8.0 albumin 1.7 WBC 13.0 hemoglobin 7.2 platelets clumped  Chest x-ray showed slightly worsening bilateral patchy airspace disease right worse than left multifocal infection thought to be likely 02/22/2018     Objective:  Vital signs in last 24 hours:  Temp:  [98.1 F (36.7 C)-98.8 F (37.1 C)] 98.1 F (36.7 C) (12/24 0809) Pulse Rate:  [84-95] 84 (12/24 0809) Resp:  [18] 18 (12/23 2327) BP: (105-121)/(66-80) 105/66 (12/24 0809) SpO2:  [96 %-98 %] 97 % (12/24 0809) Weight:  [67.5 kg] 67.5 kg (12/24 0630)  Weight change: 1.5 kg Filed Weights   02/23/18 1119 02/24/18 0500 02/25/18 0630  Weight: 63.7 kg 65.7 kg 67.5 kg    Intake/Output: I/O last 3 completed shifts: In: 1890 [I.V.:50; Other:130; NG/GT:1710] Out: 900 [Urine:600; Stool:300]   Intake/Output this shift:  No intake/output data recorded. Alert awake and oriented mucosa moist CVS- RRR JVP not elevated RS- CTA diminished air entry no crackles no wheezes ABD- BS present soft non-distended EXT-bilateral trace peripheral edema   Basic Metabolic Panel: Recent Labs  Lab 02/19/18 0325  02/21/18 0500 02/21/18 0512 02/22/18 0631 02/23/18 0642 02/24/18 0559 02/25/18 0500  NA 137   < > 137  --  136 138 137 132*  K 3.9   < > 4.0  --  3.9 4.4 4.4 4.6  CL 105   < > 99  --  98 99 96* 91*  CO2 16*   < > 24  --  _0 GLUCOSE 140*   < > 87  --  116* 107* 105* 102*  BUN 157*   < > 44*  --  75* 99* 46* 68*  CREATININE 5.66*   < > 3.95*  --  6.30* 8.02* 4.95* 6.72*  CALCIUM 7.8*   < > 7.8*  --  8.0* 8.1* 8.0* 7.8*   MG 3.0*  --   --  2.1 2.2  --   --   --   PHOS 7.5*   < > 5.5*  --  6.9* 7.9* 5.6* 8.0*   < > = values in this interval not displayed.    Liver Function Tests: Recent Labs  Lab 02/21/18 0500 02/22/18 0631 02/23/18 0642 02/24/18 0559 02/25/18 0500  AST  --  33  --   --   --   ALT  --  61*  --   --   --   ALKPHOS  --  117  --   --   --   BILITOT  --  0.4  --   --   --   PROT  --  5.7*  --   --   --   ALBUMIN 1.7* 1.6* 1.6* 1.7* 1.7*   No results for input(s): LIPASE, AMYLASE in the last 168 hours. No results for input(s): AMMONIA in the last 168 hours.  CBC: Recent Labs  Lab 02/20/18 0435 02/21/18 0512 02/22/18 0631 02/23/18 0642 02/25/18 0500  WBC 10.2 8.9 9.0 10.0 13.0*  HGB 7.6* 7.5* 7.4* 7.4* 7.2*  HCT 23.3* 22.9* 23.4* 23.3* 22.4*  MCV 92.1 93.9 93.6  94.3 94.1  PLT PLATELET CLUMPS NOTED ON SMEAR, UNABLE TO ESTIMATE 244 209 230 PLATELET CLUMPS NOTED ON SMEAR, COUNT APPEARS DECREASED    Cardiac Enzymes: Recent Labs  Lab 02/21/18 1120  CKTOTAL 38*    BNP: Invalid input(s): POCBNP  CBG: Recent Labs  Lab 02/24/18 1704 02/24/18 1953 02/24/18 2327 02/25/18 0338 02/25/18 0810  GLUCAP 97 99 102* 104* 99    Microbiology: Results for orders placed or performed during the hospital encounter of 02/09/18  Culture, blood (routine x 2)     Status: None   Collection Time: 02/09/18  5:30 PM  Result Value Ref Range Status   Specimen Description BLOOD LEFT ANTECUBITAL  Final   Special Requests   Final    BOTTLES DRAWN AEROBIC AND ANAEROBIC Blood Culture adequate volume   Culture   Final    NO GROWTH 5 DAYS Performed at [facility], [place] [address] [place], [place] [address]    Report Status 02/14/2018 FINAL  Final  Urine culture     Status: None   Collection Time: 02/09/18  6:16 PM  Result Value Ref Range Status   Specimen Description URINE, RANDOM  Final   Special Requests NONE  Final   Culture   Final    NO GROWTH Performed at [facility], [place] [address], [place], [place] [address]    Report Status 02/10/2018 FINAL  Final  Culture, blood (routine x 2)     Status: None   Collection Time: 02/09/18  6:16 PM  Result Value Ref Range Status   Specimen Description BLOOD BLOOD RIGHT WRIST  Final   Special Requests   Final    BOTTLES DRAWN AEROBIC AND ANAEROBIC Blood Culture adequate volume   Culture   Final    NO GROWTH 5 DAYS Performed at [facility], [place] [address] [place], [place] [address]    Report Status 02/14/2018 FINAL  Final  Gastrointestinal Panel by PCR , Stool     Status: None   Collection Time: 02/09/18  6:36 PM  Result Value Ref Range Status   Campylobacter species NOT DETECTED NOT DETECTED Final   Plesimonas shigelloides NOT DETECTED NOT DETECTED Final   Salmonella species NOT DETECTED NOT DETECTED Final   Yersinia enterocolitica NOT DETECTED NOT DETECTED Final   Vibrio species NOT DETECTED NOT DETECTED Final   Vibrio cholerae NOT DETECTED NOT DETECTED Final   Enteroaggregative E coli (EAEC) NOT DETECTED NOT DETECTED Final   Enteropathogenic E coli (EPEC) NOT DETECTED NOT DETECTED Final   Enterotoxigenic E coli (ETEC) NOT DETECTED NOT DETECTED Final   Shiga like toxin producing E coli (STEC) NOT DETECTED NOT DETECTED Final   Shigella/Enteroinvasive E coli (EIEC) NOT DETECTED NOT DETECTED Final   Cryptosporidium NOT DETECTED NOT DETECTED Final   Cyclospora cayetanensis NOT DETECTED NOT DETECTED Final   Entamoeba histolytica NOT DETECTED NOT DETECTED Final   Giardia lamblia NOT DETECTED NOT DETECTED Final   Adenovirus F40/41 NOT DETECTED NOT DETECTED Final   Astrovirus NOT DETECTED NOT DETECTED Final   Norovirus GI/GII NOT DETECTED NOT DETECTED Final   Rotavirus A NOT DETECTED NOT DETECTED Final   Sapovirus (I, II, IV, and V) NOT DETECTED NOT DETECTED Final    Comment: Performed at [facility], [address] [place], [place] [address]  C difficile quick scan w PCR reflex      Status: None   Collection Time: 02/09/18  6:36 PM  Result Value Ref Range  Status   C Diff antigen NEGATIVE NEGATIVE Final   C Diff toxin NEGATIVE NEGATIVE Final   C Diff interpretation No C. difficile detected.  Final    Comment: Performed at [facility], [place] [address], [place], [place] [address]  MRSA PCR Screening     Status: None   Collection Time: 02/10/18  1:34 AM  Result Value Ref Range Status   MRSA by PCR NEGATIVE NEGATIVE Final    Comment:        The GeneXpert MRSA Assay (FDA approved for NASAL specimens only), is one component of a comprehensive MRSA colonization surveillance program. It is not intended to diagnose MRSA infection nor to guide or monitor treatment for MRSA infections. Performed at [facility], [place] [address], [place], [place] [address]   Culture, bal-quantitative     Status: None   Collection Time: 02/10/18  1:26 PM  Result Value Ref Range Status   Specimen Description BRONCHIAL ALVEOLAR LAVAGE  Final   Special Requests Normal  Final   Gram Stain   Final    FEW WBC PRESENT,BOTH PMN AND MONONUCLEAR NO ORGANISMS SEEN    Culture   Final    NO GROWTH 2 DAYS Performed at [facility], [place] [address] [place], [place] [address]    Report Status 02/12/2018 FINAL  Final  Pneumocystis smear by DFA     Status: None   Collection Time: 02/10/18  1:26 PM  Result Value Ref Range Status   Specimen Source-PJSRC BRONCHIAL ALVEOLAR LAVAGE  Final   Pneumocystis jiroveci Ag NEGATIVE  Final    Comment: Performed at [facility] Performed at [facility], [address] [place],  [address]     Coagulation Studies: No results for input(s): LABPROT, INR in the last 72 hours.  Urinalysis: No results for input(s): COLORURINE, LABSPEC, PHURINE, GLUCOSEU, HGBUR, BILIRUBINUR, KETONESUR, PROTEINUR, UROBILINOGEN, NITRITE, LEUKOCYTESUR in the last 72 hours.  Invalid input(s): APPERANCEUR    Imaging: Dg Swallowing Func-speech  Pathology  Result Date: 02/24/2018 Objective Swallowing Evaluation: Type of Study: MBS-Modified Barium Swallow Study  Patient Details Name: Roberto Knapp MRN: [id number] Date of Birth: 01/17/1971 Today's Date: 02/24/2018 Time: SLP Start Time (ACUTE ONLY): 1127 -SLP Stop Time (ACUTE ONLY): 3546 SLP Time Calculation (min) (ACUTE ONLY): 29 min Past Medical History: Past Medical History: Diagnosis Date . Drug overdose, intentional [facility])  Past Surgical History: Past Surgical History: Procedure Laterality Date . FASCIOTOMY  09/08/2011  Procedure: FASCIOTOMY;  Surgeon: Elam Dutch, MD;  Location: [place];  Service: Vascular;  Laterality: Left; . FEMORAL-POPLITEAL BYPASS GRAFT  09/08/2011  Procedure: BYPASS GRAFT FEMORAL-POPLITEAL ARTERY;  Surgeon: Elam Dutch, MD;  Location: [facility] OR;  Service: Vascular;  Laterality: Left; . NO PAST SURGERIES   HPI: 47 yo male admitted 02/09/18 w/ CAP, legionella infection, probable gastritis (from NSAIDS) and progressive hypoxic resp failure. Developed severe ARDS requiring ETT 12/9-12/18, paralysis, and proning. PMH: intentional overdose. CXR = Diffuse right lung airspace disease and left lower lobe airspace opacity, worsened since last CXR  Subjective: pt pleasant, confused, makes jokes when he does not seem to know an answer Assessment / Plan / Recommendation CHL IP CLINICAL IMPRESSIONS 02/24/2018 Clinical Impression Pt has mild, generalized oropharyngeal weakness that allows for reduced lingual propulsion orally but also with reduced base of tongue retraction, hyolaryngeal movement, and epiglottic inversion. His oral clearance is adequate and he has only trace to mild amounts of vallecular residue. He has  trace amounts of penetration during the swallow with all consistencies tested except for honey thick liquids by tsp. The amount of other consistencies penetrated is very small, but unfortunately he cannot clear them with a cued cough, and even during testing they begin to fall to the  true vocal folds without sensation. Suspect that across a meal tray, aspiration would be likely. Recommend SLP f/u for introduction of therapeutic trials of honey thick liquids by spoon and use of strengthening exercises (RMT?) to facilitate return to POs, with prognosis good given additional time post-extubation and improved strength of cough. SLP Visit Diagnosis Dysphagia, oropharyngeal phase (R13.12) Attention and concentration deficit following -- Frontal lobe and executive function deficit following -- Impact on safety and function Moderate aspiration risk   CHL IP TREATMENT RECOMMENDATION 02/24/2018 Treatment Recommendations Therapy as outlined in treatment plan below   Prognosis 02/24/2018 Prognosis for Safe Diet Advancement Good Barriers to Reach Goals Cognitive deficits Barriers/Prognosis Comment -- CHL IP DIET RECOMMENDATION 02/24/2018 SLP Diet Recommendations NPO;Alternative means - temporary Liquid Administration via -- Medication Administration Via alternative means Compensations -- Postural Changes --   CHL IP OTHER RECOMMENDATIONS 02/24/2018 Recommended Consults -- Oral Care Recommendations Oral care QID Other Recommendations --   CHL IP FOLLOW UP RECOMMENDATIONS 02/24/2018 Follow up Recommendations Skilled Nursing facility;LTACH   CHL IP FREQUENCY AND DURATION 02/24/2018 Speech Therapy Frequency (ACUTE ONLY) min 2x/week Treatment Duration 2 weeks      CHL IP ORAL PHASE 02/24/2018 Oral Phase Impaired Oral - Pudding Teaspoon -- Oral - Pudding Cup -- Oral - Honey Teaspoon Delayed oral transit;Weak lingual manipulation Oral - Honey Cup -- Oral - Nectar Teaspoon Delayed oral transit;Weak lingual manipulation Oral - Nectar Cup -- Oral - Nectar Straw -- Oral - Thin Teaspoon Weak lingual manipulation Oral - Thin Cup Weak lingual manipulation Oral - Thin Straw -- Oral - Puree Weak lingual manipulation;Delayed oral transit Oral - Mech Soft -- Oral - Regular -- Oral - Multi-Consistency -- Oral - Pill -- Oral  Phase - Comment --  CHL IP PHARYNGEAL PHASE 02/24/2018 Pharyngeal Phase Impaired Pharyngeal- Pudding Teaspoon -- Pharyngeal -- Pharyngeal- Pudding Cup -- Pharyngeal -- Pharyngeal- Honey Teaspoon Reduced epiglottic inversion;Reduced anterior laryngeal mobility;Reduced laryngeal elevation;Reduced tongue base retraction;Pharyngeal residue - valleculae Pharyngeal -- Pharyngeal- Honey Cup -- Pharyngeal -- Pharyngeal- Nectar Teaspoon Reduced epiglottic inversion;Reduced anterior laryngeal mobility;Reduced laryngeal elevation;Reduced tongue base retraction;Pharyngeal residue - valleculae;Penetration/Aspiration during swallow Pharyngeal Material enters airway, CONTACTS cords and not ejected out Pharyngeal- Nectar Cup -- Pharyngeal -- Pharyngeal- Nectar Straw -- Pharyngeal -- Pharyngeal- Thin Teaspoon Reduced epiglottic inversion;Reduced anterior laryngeal mobility;Reduced laryngeal elevation;Reduced tongue base retraction Pharyngeal -- Pharyngeal- Thin Cup Reduced epiglottic inversion;Reduced anterior laryngeal mobility;Reduced laryngeal elevation;Reduced tongue base retraction;Pharyngeal residue - valleculae;Penetration/Aspiration during swallow Pharyngeal Material enters airway, CONTACTS cords and not ejected out Pharyngeal- Thin Straw -- Pharyngeal -- Pharyngeal- Puree Reduced epiglottic inversion;Reduced anterior laryngeal mobility;Reduced laryngeal elevation;Reduced tongue base retraction;Pharyngeal residue - valleculae;Penetration/Aspiration during swallow Pharyngeal Material enters airway, remains ABOVE vocal cords and not ejected out Pharyngeal- Mechanical Soft -- Pharyngeal -- Pharyngeal- Regular -- Pharyngeal -- Pharyngeal- Multi-consistency -- Pharyngeal -- Pharyngeal- Pill -- Pharyngeal -- Pharyngeal Comment --  CHL IP CERVICAL ESOPHAGEAL PHASE 02/24/2018 Cervical Esophageal Phase WFL Pudding Teaspoon -- Pudding Cup -- Honey Teaspoon -- Honey Cup -- Nectar Teaspoon -- Nectar Cup -- Nectar Straw -- Thin Teaspoon  -- Thin Cup -- Thin Straw -- Puree -- Mechanical Soft -- Regular -- Multi-consistency -- Pill -- Cervical Esophageal Comment -- Germain Osgood 02/24/2018,  1:23 PM  Germain Osgood, M.A. CCC-SLP Acute Rehabilitation Services Pager [phone] Office [phone]             Vas [place] Upper Extremity Venous Duplex  Result Date: 02/24/2018 UPPER VENOUS STUDY  Indications: Pain Performing Technologist: June Leap RDMS, RVT  Examination Guidelines: A complete evaluation includes B-mode imaging, spectral Doppler, color Doppler, and power Doppler as needed of all accessible portions of each vessel. Bilateral testing is considered an integral part of a complete examination. Limited examinations for reoccurring indications may be performed as noted.  Right Findings: +----------+------------+----------+---------+-----------+-------+ RIGHT     CompressiblePropertiesPhasicitySpontaneousSummary +----------+------------+----------+---------+-----------+-------+ Subclavian                         Yes       Yes            +----------+------------+----------+---------+-----------+-------+  Left Findings: +----------+------------+----------+---------+-----------+-------+ LEFT      CompressiblePropertiesPhasicitySpontaneousSummary +----------+------------+----------+---------+-----------+-------+ IJV           Full                 Yes       Yes            +----------+------------+----------+---------+-----------+-------+ Subclavian    Full                 Yes       Yes            +----------+------------+----------+---------+-----------+-------+ Axillary      Full                 Yes       Yes            +----------+------------+----------+---------+-----------+-------+ Brachial      Full                 Yes       Yes            +----------+------------+----------+---------+-----------+-------+ Radial        Full                                           +----------+------------+----------+---------+-----------+-------+ Ulnar         Full                                          +----------+------------+----------+---------+-----------+-------+ Cephalic      Full                                          +----------+------------+----------+---------+-----------+-------+ Basilic       None                                   Acute  +----------+------------+----------+---------+-----------+-------+  Summary:  Right: No evidence of thrombosis in the subclavian.  Left: No evidence of deep vein thrombosis in the upper extremity. Findings consistent with acute superficial vein thrombosis involving the left basilic vein.  *See table(s) above for measurements and observations.  Diagnosing physician: Curt Jews MD Electronically signed by Curt Jews MD on 02/24/2018 at 1:44:18 PM.    Final  Medications:   . sodium chloride Stopped (02/21/18 1536)  . feeding supplement (NEPRO CARB STEADY) 1,000 mL (02/24/18 1702)   . ALPRAZolam  0.25 mg Oral Once  . bacitracin   Topical BID  . Chlorhexidine Gluconate Cloth  6 each Topical Q0600  . darbepoetin (ARANESP) injection - NON-DIALYSIS  100 mcg Subcutaneous Q Tue-1800  . guaiFENesin  10 mL Per Tube TID  . heparin injection (subcutaneous)  5,000 Units Subcutaneous Q8H  . Influenza vac split quadrivalent PF  0.5 mL Intramuscular Tomorrow-1000  . levalbuterol  0.63 mg Nebulization Q8H  . pantoprazole sodium  40 mg Per Tube Daily  . sodium chloride flush  10-40 mL Intracatheter Q12H   sodium chloride, oxyCODONE **AND** acetaminophen, acetaminophen, [DISCONTINUED] ondansetron **OR** ondansetron (ZOFRAN) IV, sodium chloride flush, traZODone  Assessment/ Plan:   Acute kidney injury in setting of ATN recent history of nonsteroidal anti-inflammatory drugs and IV contrast was started on CRRT 02/12/2018 to 02/17/2018 and started on intermittent hemodialysis 02/19/2018, 02/20/2018 and 02/23/2018.   Vas-Cath appears to have been in place for 13 days .  Urine output slow to improve although is a little more.  Think we should dialyze him today for clearance, hopefully should improve  Hypertension/volume appears to be stable at this point will continue to follow appears euvolemic  Anemia with darbepoetin 100 mcg 02/18/2018    Acute hypoxic respiratory failure secondary to Legionella ARDS.  Mated with septic shock.  Continues on Levaquin.  Toxic encephalopathy appears to have improved  Atrial fibrillation with rapid ventricular rate anticoagulation deferred.  No further episodes of atrial fibrillation noted patient now in sinus rhythm  Diarrhea with rectal tube does not appear to be secondary to C. Difficile  Electrolytes appear to be stable  Acid-base status stable.    LOS: [place] _0 _1 :46 AM

## 2018-02-25 NOTE — Progress Notes (Signed)
[facility] Progress Note  Patient: Roberto BussingGary W Cutbirth SWF:[id number]   PCP: Deatra JamesSun, Vyvyan, MD DOB: 10-29-1970   DOA: 02/09/2018   DOS: 02/25/2018   Date of Service: the patient was seen and examined on 02/25/2018  Brief hospital course: Pt. With no significant PMH; admitted on 02/09/2018, presented with complaint of Fever, aches, SOB, abdominal pain, N/V, melena, BRBPR, was found to have CAP, legionella infection, probable gastritis (from NSAIDS) and progressive hypoxic resp failure. Developed severe ARDS requiring paralysis and proning. Currently further plan is continue Antibiotics, monitor renal function, and oral intake.  Subjective: Reports abdominal cramps, nausea.  No vomiting.  No bowel movement in last 24 hours.  Passing gas.  Assessment and Plan: 1.  Acute hypoxic respiratory failure Community-acquired pneumonia secondary to Legionella ARDS Sepsis with septic shock currently resolved, present on admission. Acute toxic and metabolic encephalopathy. Presents with cough and shortness of breath.  Initially admitted to the floor later on transferred to ICU requiring intubation. Patient developed ARDS. Hypoxia was so severe that patient required proning Currently on room air. Initially treated with IV ceftriaxone azithromycin followed by IV Zosyn and vancomycin followed by IV Levaquin. Leukocytosis is normalized. Patient completed course of antibiotic recommended by PCCM. So far cultures are negative. Only positive findings is Legionella. Required IV pressors in the ICU currently resolved.  Patient was also on IV stress dose steroids in the ICU.  Currently stop.  2.  Acute kidney injury. Mixed anion gap and non-anion gap metabolic and respiratory acidosis. Mixture of prerenal etiology, ATN and hemodynamic disturbances. Nephrology was consulted. Patient required CRRT in the ICU. Currently requiring intermittent HD. Has temporary dialysis catheter in right IJ. Urine output is  improving. Monitor.  3.  Multifactorial acute metabolic and toxic encephalopathy. Likely hospital/ICU induced delirium, uremia, hypoxic injury, medications induced. Also appears to have critical illness weakness. Alert awake and oriented x3 right now but not following commands consistently. Monitor for now. Speech therapy consulted for cognitive evaluation along with swallowing evaluation.  4.  Dysphagia. Speech therapy consult for swallowing. Currently n.p.o. Core track inserted for nutrition. Continue Nepro. Underwent modified barium swallow eval which suggested high aspiration risk and recommendation remains for n.p.o. for now.  5. Anemia,  multifactorial secondary to acute blood loss secondary to GI bleeding, critical illness with sepsis and renal disease Continue to monitor H&H. Transfuse for hemoglobin less than 7. Continue PPI twice a day. Since the patient does not appear to have any active GI bleeding currently GI was not consulted.  6.  A. fib with RVR. Suspecting A. fib was paroxysmal currently sinus rhythm. Patient was on amiodarone for rate control. Anticoagulation was deferred secondary to GI bleed. Suspect that the patient will likely not need anticoagulation going forward if he does not have any further episodes of A. fib.  7.  LFT elevation. Currently stable.  8.  Bilateral leg pain. CK normal. Etiology not clear. Currently no pain. Continue Percocet.  9.  Diarrhea. Likely secondary to tube feeding. Also secondary to antibiotics use. No abdominal pain, abdominal distention, nausea vomiting, leukocytosis. Does not appear to be C. difficile. Patient received 2 doses of scheduled Imodium. Now that the patient has abdominal cramps, will get another repeat x-ray and hold the tube feedings. Discontinue rectal tube.  10.  Left upper extremity pain. Upper extremity basilic SVT Mild edema noted. Currently not at risk for further worsening of DVT.  And risk  for bleeding  versus risk for clot progression higher secondary to his  recent upper GI bleed. Therefore will hold off on anticoagulation. Treat with warm compress.  Pressure Injury 02/18/18 Unstageable - Full thickness tissue loss in which the base of the ulcer is covered by slough (yellow, tan, gray, green or brown) and/or eschar (tan, brown or black) in the wound bed. (Active)  02/18/18 2000  Location: Penis  Location Orientation: Mid;Upper;Lower  Staging: Unstageable - Full thickness tissue loss in which the base of the ulcer is covered by slough (yellow, tan, gray, green or brown) and/or eschar (tan, brown or black) in the wound bed.  Wound Description (Comments):   Present on Admission: No     Diet: tube feed DVT Prophylaxis: mechanical compression device  Advance goals of care discussion: full code  Family Communication: family was present at bedside, at the time of interview. The pt provided permission to discuss medical plan with the family. Opportunity was given to ask question and all questions were answered satisfactorily.   Disposition:  Discharge to be determined.  Consultants: Nephrology, PCCM Procedures:  OETT 12/9 >> 12/18 Art line 12/9 >> 12/17 Lt IJ CVL 12/9 >>  Rt IJ HD cath 12/11  Scheduled Meds: . ALPRAZolam  0.25 mg Oral Once  . bacitracin   Topical BID  . Chlorhexidine Gluconate Cloth  6 each Topical Q0600  . Chlorhexidine Gluconate Cloth  6 each Topical Q0600  . darbepoetin (ARANESP) injection - NON-DIALYSIS  100 mcg Subcutaneous Q Tue-1800  . guaiFENesin  10 mL Per Tube TID  . heparin injection (subcutaneous)  5,000 Units Subcutaneous Q8H  . Influenza vac split quadrivalent PF  0.5 mL Intramuscular Tomorrow-1000  . levalbuterol  0.63 mg Nebulization Q8H  . pantoprazole sodium  40 mg Per Tube Daily  . sodium chloride flush  10-40 mL Intracatheter Q12H   Continuous Infusions: . sodium chloride Stopped (02/21/18 1536)  . feeding supplement (NEPRO  CARB STEADY) 1,000 mL (02/25/18 1639)   PRN Meds: sodium chloride, oxyCODONE **AND** acetaminophen, acetaminophen, [DISCONTINUED] ondansetron **OR** ondansetron (ZOFRAN) IV, sodium chloride flush, traZODone Antibiotics: Anti-infectives (From admission, onward)   Start     Dose/Rate Route Frequency Ordered Stop   02/19/18 1000  levofloxacin (LEVAQUIN) IVPB 500 mg     500 mg 100 mL/hr over 60 Minutes Intravenous Every 48 hours 02/17/18 1106 02/23/18 1859   02/14/18 0900  Levofloxacin (LEVAQUIN) IVPB 250 mg  Status:  Discontinued     250 mg 50 mL/hr over 60 Minutes Intravenous Every 24 hours 02/13/18 0815 02/17/18 1106   02/13/18 0830  levofloxacin (LEVAQUIN) IVPB 500 mg     500 mg 100 mL/hr over 60 Minutes Intravenous  Once 02/13/18 0815 02/13/18 1150   02/12/18 2000  vancomycin (VANCOCIN) 1,500 mg in sodium chloride 0.9 % 500 mL IVPB  Status:  Discontinued     1,500 mg 250 mL/hr over 120 Minutes Intravenous Every 24 hours 02/11/18 1406 02/11/18 1521   02/12/18 2000  piperacillin-tazobactam (ZOSYN) IVPB 3.375 g  Status:  Discontinued     3.375 g 100 mL/hr over 30 Minutes Intravenous Every 6 hours 02/12/18 1408 02/13/18 0815   02/10/18 2000  vancomycin (VANCOCIN) 1,250 mg in sodium chloride 0.9 % 250 mL IVPB  Status:  Discontinued     1,250 mg 166.7 mL/hr over 90 Minutes Intravenous Every 24 hours 02/09/18 1839 02/10/18 0728   02/10/18 1530  vancomycin (VANCOCIN) IVPB 750 mg/150 ml premix  Status:  Discontinued     750 mg 150 mL/hr over 60 Minutes Intravenous Every 12 hours 02/10/18  1432 02/11/18 1406   02/10/18 1400  piperacillin-tazobactam (ZOSYN) IVPB 3.375 g  Status:  Discontinued     3.375 g 12.5 mL/hr over 240 Minutes Intravenous Every 8 hours 02/10/18 0731 02/12/18 1408   02/10/18 0745  piperacillin-tazobactam (ZOSYN) IVPB 3.375 g     3.375 g 100 mL/hr over 30 Minutes Intravenous  Once 02/10/18 0728 02/10/18 0921   02/10/18 0600  ceFEPIme (MAXIPIME) 2 g in sodium chloride 0.9 %  100 mL IVPB  Status:  Discontinued     2 g 200 mL/hr over 30 Minutes Intravenous Every 12 hours 02/09/18 1839 02/09/18 2014   02/10/18 0200  cefTRIAXone (ROCEPHIN) 1 g in sodium chloride 0.9 % 100 mL IVPB  Status:  Discontinued     1 g 200 mL/hr over 30 Minutes Intravenous Every 24 hours 02/09/18 2014 02/10/18 0728   02/09/18 2015  azithromycin (ZITHROMAX) 500 mg in sodium chloride 0.9 % 250 mL IVPB  Status:  Discontinued     500 mg 250 mL/hr over 60 Minutes Intravenous Every 24 hours 02/09/18 2014 02/12/18 1306   02/09/18 1830  vancomycin (VANCOCIN) 1,250 mg in sodium chloride 0.9 % 250 mL IVPB     1,250 mg 166.7 mL/hr over 90 Minutes Intravenous  Once 02/09/18 1800 02/09/18 2127   02/09/18 1800  ceFEPIme (MAXIPIME) 2 g in sodium chloride 0.9 % 100 mL IVPB     2 g 200 mL/hr over 30 Minutes Intravenous  Once 02/09/18 1745 02/09/18 1925   02/09/18 1800  metroNIDAZOLE (FLAGYL) IVPB 500 mg  Status:  Discontinued     500 mg 100 mL/hr over 60 Minutes Intravenous Every 8 hours 02/09/18 1745 02/10/18 0728   02/09/18 1800  vancomycin (VANCOCIN) IVPB 1000 mg/200 mL premix  Status:  Discontinued     1,000 mg 200 mL/hr over 60 Minutes Intravenous  Once 02/09/18 1745 02/09/18 1800       Objective: Physical Exam: Vitals:   02/25/18 1400 02/25/18 1430 02/25/18 1502 02/25/18 1654  BP: 111/64 112/71 106/71 115/87  Pulse: 74 79 88 92  Resp:   13   Temp:   98.6 F (37 C) 98.1 F (36.7 C)  TempSrc:   Oral Oral  SpO2:    99%  Weight:   63.7 kg   Height:        Intake/Output Summary (Last 24 hours) at 02/25/2018 1830 Last data filed at 02/25/2018 1810 Gross per 24 hour  Intake 300 ml  Output 2030 ml  Net -1730 ml   Filed Weights   02/25/18 0630 02/25/18 1130 02/25/18 1502  Weight: 67.5 kg 64.6 kg 63.7 kg   General: Alert, Awake and Oriented to Time, Place and Person. Appear in mild distress, affect appropriate Eyes: PERRL, Conjunctiva normal ENT: Oral Mucosa clear moist. Neck: no  JVD, no Abnormal Mass Or lumps Cardiovascular: S1 and S2 Present, no Murmur, Peripheral Pulses Present Respiratory: normal respiratory effort, Bilateral Air entry equal and Decreased, no use of accessory muscle, Clear to Auscultation, no Crackles, no wheezes Abdomen: Bowel Sound present, Soft and no tenderness, no hernia Skin: no redness, on Rash, no induration Extremities: bilateral trace Pedal edema, no calf tenderness Neurologic: Grossly no focal neuro deficit. Bilaterally Equal motor strength although 4/5  Data Reviewed: CBC: Recent Labs  Lab 02/20/18 0435 02/21/18 0512 02/22/18 0631 02/23/18 0642 02/25/18 0500  WBC 10.2 8.9 9.0 10.0 13.0*  HGB 7.6* 7.5* 7.4* 7.4* 7.2*  HCT 23.3* 22.9* 23.4* 23.3* 22.4*  MCV 92.1 93.9 93.6 94.3  94.1  PLT PLATELET CLUMPS NOTED ON SMEAR, UNABLE TO ESTIMATE 244 209 230 PLATELET CLUMPS NOTED ON SMEAR, COUNT APPEARS DECREASED   Basic Metabolic Panel: Recent Labs  Lab 02/19/18 0325  02/21/18 0500 02/21/18 0512 02/22/18 0631 02/23/18 0642 02/24/18 0559 02/25/18 0500  NA 137   < > 137  --  136 138 137 132*  K 3.9   < > 4.0  --  3.9 4.4 4.4 4.6  CL 105   < > 99  --  98 99 96* 91*  CO2 16*   < > 24  --  24 23 27 26   GLUCOSE 140*   < > 87  --  116* 107* 105* 102*  BUN 157*   < > 44*  --  75* 99* 46* 68*  CREATININE 5.66*   < > 3.95*  --  6.30* 8.02* 4.95* 6.72*  CALCIUM 7.8*   < > 7.8*  --  8.0* 8.1* 8.0* 7.8*  MG 3.0*  --   --  2.1 2.2  --   --   --   PHOS 7.5*   < > 5.5*  --  6.9* 7.9* 5.6* 8.0*   < > = values in this interval not displayed.    Liver Function Tests: Recent Labs  Lab 02/21/18 0500 02/22/18 0631 02/23/18 0642 02/24/18 0559 02/25/18 0500  AST  --  33  --   --   --   ALT  --  61*  --   --   --   ALKPHOS  --  117  --   --   --   BILITOT  --  0.4  --   --   --   PROT  --  5.7*  --   --   --   ALBUMIN 1.7* 1.6* 1.6* 1.7* 1.7*   No results for input(s): LIPASE, AMYLASE in the last 168 hours. No results for input(s):  AMMONIA in the last 168 hours. Coagulation Profile: No results for input(s): INR, PROTIME in the last 168 hours. Cardiac Enzymes: Recent Labs  Lab 02/21/18 1120  CKTOTAL 38*   BNP (last 3 results) No results for input(s): PROBNP in the last 8760 hours. CBG: Recent Labs  Lab 02/24/18 1953 02/24/18 2327 02/25/18 0338 02/25/18 0810 02/25/18 1655  GLUCAP 99 102* 104* 99 126*   Studies: No results found.   Time spent: 35 minutes  Author: Lynden Oxford, MD [facility] Pager: [phone] 02/25/2018 6:30 PM  Between 7PM-7AM, please contact night-coverage at [email or web address], password [email or web address]

## 2018-02-25 NOTE — Plan of Care (Signed)
  Problem: Clinical Measurements: Goal: Ability to maintain a body temperature in the normal range will improve Outcome: Progressing   Problem: Respiratory: Goal: Ability to maintain adequate ventilation will improve Outcome: Progressing   Problem: Education: Goal: Ability to identify signs and symptoms of gastrointestinal bleeding will improve Outcome: Progressing

## 2018-02-25 NOTE — Progress Notes (Signed)
Physical Therapy Treatment Patient Details Name: Nolon BussingGary W Angell MRN: [id number] DOB: 1971/01/05 Today's Date: 02/25/2018    History of Present Illness Pt is a 47 y.o. M with no significant PMH who was admitted with fever, aches, SOB, abdominal pain, N/V, BRBPR who was found to have CAP, legionella infection, and progressive hypoxic failure. Developed severe ARDS requiring paralysis and proning. Intubated 12/9-12/18    PT Comments    Pt is progressing with his mobility, essentially one person mod assist for EOB and OOB to chair today (second person used for line management).  Pt weak, sore on his bottom, and cognition is improving.  He reported at the end of the session that he wants to get stronger for his wife (who was present).  I think he is starting towards being ready to be considered for an intensive rehab program. I would like to start short distance gait with RW next session if possible (will need second person for safety during initial gait trails).  PT will continue to follow acutely for safe mobility progression.  Wife given HEP to practice with him during the day.   [employer] HEP:[id number]   Follow Up Recommendations  CIR     Equipment Recommendations  Rolling walker with 5" wheels;3in1 (PT)    Recommendations for Other Services Rehab consult     Precautions / Restrictions Precautions Precautions: Fall Precaution Comments: cortrak, rectal tube    Mobility  Bed Mobility Overal bed mobility: Needs Assistance Bed Mobility: Rolling;Sidelying to Sit Rolling: Min assist Sidelying to sit: Mod assist       General bed mobility comments: Min assist to roll to his side, mod assist to support truk and progress legs over EOB to raise up to sitting.  Pt immediate discomfort due to rectal tube and painful bottom.   Transfers Overall transfer level: Needs assistance Equipment used: 1 person hand held assist Transfers: Sit to/from UGI CorporationStand;Stand Pivot Transfers Sit to Stand: Mod  assist;+2 safety/equipment Stand pivot transfers: Mod assist;+2 safety/equipment       General transfer comment: Mod assist to stand over weak legs EOB, theapist got under his right arm to help support him better, second person helping to manage lines while pt took pivotal steps around to the recliner chair.   Ambulation/Gait             General Gait Details: We discussed today that pt is ready for gait with RW and chair to follow next session.  Wife asking what kind of exercises she can do in the meantime to help.         Balance Overall balance assessment: Needs assistance Sitting-balance support: Feet supported;Bilateral upper extremity supported Sitting balance-Leahy Scale: Fair     Standing balance support: Single extremity supported Standing balance-Leahy Scale: Poor Standing balance comment: mod assist in standing to support trunk over weak legs                            Cognition Arousal/Alertness: Awake/alert Behavior During Therapy: Flat affect Overall Cognitive Status: Impaired/Different from baseline Area of Impairment: Orientation;Attention                 Orientation Level: Disoriented to;Time(knows it is dec, but not specific day or date) Current Attention Level: Sustained Memory: Decreased short-term memory Following Commands: Follows one step commands consistently   Awareness: Emergent   General Comments: Cognition improving      Exercises General Exercises - Lower Extremity Ankle Circles/Pumps:  AROM;Both;20 reps Long Arc Quad: AROM;Both;10 reps Heel Slides: AROM;Both;10 reps Hip ABduction/ADduction: AROM;Both;10 reps        Pertinent Vitals/Pain Pain Assessment: Faces Faces Pain Scale: Hurts whole lot Pain Location: bottom Pain Descriptors / Indicators: Grimacing;Guarding Pain Intervention(s): Limited activity within patient's tolerance;Monitored during session;Repositioned        PT Goals (current goals can now be  found in the care plan section) Acute Rehab PT Goals Patient Stated Goal: to get stronger for his wife, to eat Progress towards PT goals: Progressing toward goals    Frequency    Min 3X/week      PT Plan Discharge plan needs to be updated       AM-PAC PT "6 Clicks" Mobility   Outcome Measure  Help needed turning from your back to your side while in a flat bed without using bedrails?: A Little Help needed moving from lying on your back to sitting on the side of a flat bed without using bedrails?: A Lot Help needed moving to and from a bed to a chair (including a wheelchair)?: A Lot Help needed standing up from a chair using your arms (e.g., wheelchair or bedside chair)?: A Lot Help needed to walk in hospital room?: A Lot Help needed climbing 3-5 steps with a railing? : Total 6 Click Score: 12    End of Session Equipment Utilized During Treatment: Gait belt Activity Tolerance: Patient limited by pain Patient left: in chair;with call bell/phone within reach;with family/visitor present Nurse Communication: Mobility status PT Visit Diagnosis: Muscle weakness (generalized) (M62.81);Other abnormalities of gait and mobility (R26.89);Pain Pain - Right/Left: (bottom) Pain - part of body: (bottom)     Time: [id number] PT Time Calculation (min) (ACUTE ONLY): 52 min  Charges:  $Therapeutic Exercise: 8-22 mins $Therapeutic Activity: 23-37 mins                    Lawrence Mitch B. Tanya Marvin, PT, DPT  Acute Rehabilitation [phone] pager #[phone] office   02/25/2018, 5:16 PM

## 2018-02-25 NOTE — Progress Notes (Signed)
PT Cancellation Note  Patient Details Name: Roberto Knapp MRN: [id number] DOB: May 29, 1970   Cancelled Treatment:    Reason Eval/Treat Not Completed: Patient at procedure or test/unavailable.  Pt is in HD.  PT will do our very best to come back later this PM after he gets back from HD.    Thanks,    Rollene Rotundaebecca B. Shelba Susi, PT, DPT  Acute Rehabilitation [phone] pager #[phone] office   02/25/2018, 12:17 PM

## 2018-02-26 DIAGNOSIS — L89890 Pressure ulcer of other site, unstageable: Secondary | ICD-10-CM

## 2018-02-26 DIAGNOSIS — R0902 Hypoxemia: Secondary | ICD-10-CM

## 2018-02-26 DIAGNOSIS — R0602 Shortness of breath: Secondary | ICD-10-CM

## 2018-02-26 LAB — CBC
HCT: 22.1 % — ABNORMAL LOW (ref 39.0–52.0)
Hemoglobin: 6.8 g/dL — CL (ref 13.0–17.0)
MCH: 29.8 pg (ref 26.0–34.0)
MCHC: 30.8 g/dL (ref 30.0–36.0)
MCV: 96.9 fL (ref 80.0–100.0)
Platelets: 150 10*3/uL (ref 150–400)
RBC: 2.28 MIL/uL — ABNORMAL LOW (ref 4.22–5.81)
RDW: 15 % (ref 11.5–15.5)
WBC: 11.4 10*3/uL — ABNORMAL HIGH (ref 4.0–10.5)
nRBC: 0 % (ref 0.0–0.2)

## 2018-02-26 LAB — RENAL FUNCTION PANEL
Albumin: 1.7 g/dL — ABNORMAL LOW (ref 3.5–5.0)
Anion gap: 12 (ref 5–15)
BUN: 33 mg/dL — ABNORMAL HIGH (ref 6–20)
CO2: 28 mmol/L (ref 22–32)
Calcium: 8.1 mg/dL — ABNORMAL LOW (ref 8.9–10.3)
Chloride: 96 mmol/L — ABNORMAL LOW (ref 98–111)
Creatinine, Ser: 4.16 mg/dL — ABNORMAL HIGH (ref 0.61–1.24)
GFR calc Af Amer: 18 mL/min — ABNORMAL LOW (ref >60)
GFR calc non Af Amer: 16 mL/min — ABNORMAL LOW (ref >60)
Glucose, Bld: 97 mg/dL (ref 70–99)
Phosphorus: 5.9 mg/dL — ABNORMAL HIGH (ref 2.5–4.6)
Potassium: 4.1 mmol/L (ref 3.5–5.1)
Sodium: 136 mmol/L (ref 135–145)

## 2018-02-26 LAB — HEMOGLOBIN AND HEMATOCRIT, BLOOD
HCT: 24.1 % — ABNORMAL LOW (ref 39.0–52.0)
Hemoglobin: 7.4 g/dL — ABNORMAL LOW (ref 13.0–17.0)

## 2018-02-26 LAB — GLUCOSE, CAPILLARY
Glucose-Capillary: 100 mg/dL — ABNORMAL HIGH (ref 70–99)
Glucose-Capillary: 86 mg/dL (ref 70–99)
Glucose-Capillary: 91 mg/dL (ref 70–99)
Glucose-Capillary: 94 mg/dL (ref 70–99)
Glucose-Capillary: 95 mg/dL (ref 70–99)
Glucose-Capillary: 96 mg/dL (ref 70–99)

## 2018-02-26 LAB — PREPARE RBC (CROSSMATCH)

## 2018-02-26 MED ORDER — LEVALBUTEROL HCL 0.63 MG/3ML IN NEBU
0.6300 mg | INHALATION_SOLUTION | Freq: Three times a day (TID) | RESPIRATORY_TRACT | Status: DC
  Administered 2018-02-27 – 2018-03-01 (×7): 0.63 mg via RESPIRATORY_TRACT
  Filled 2018-02-26 (×7): qty 3

## 2018-02-26 MED ORDER — SODIUM CHLORIDE 0.9% IV SOLUTION: Freq: Once | INTRAVENOUS | Status: DC

## 2018-02-26 NOTE — Progress Notes (Signed)
Patient refused his CHG bath, weight, and spouse stated he was very tired and requested to please let him sleep.

## 2018-02-26 NOTE — Progress Notes (Signed)
PROGRESS NOTE    Roberto Knapp  ZOX:[id number] DOB: 12-07-1970 DOA: 02/09/2018 PCP: [name], MD   Brief Narrative:  HPI On 02/09/2018 by Dr. [name] Roberto Knapp is a 47 y.o. male who denies any significant past medical history, now presenting to the emergency department with approximately 10 days of fevers, generalized aches, abdominal pain, nausea, vomiting, diarrhea, cough, and shortness of breath.  Symptoms began with fevers, generalized aches, abdominal discomfort, and nausea with nonbloody vomiting, and diarrhea, but have progressed to include worsening shortness of breath, productive cough, melena, and then bright red blood per rectum.  Patient had been taking Advil every 4 hours for his symptoms, developed melena after few days, but reports more recent bright red blood in his stool.  Abdominal pain is mainly epigastric.  Denies chest pain.  Denies leg swelling or tenderness.  No headache, change in vision or hearing, or focal numbness or weakness.  He has never experienced these symptoms previously.  No recent travel or sick contacts.  Reports drinking 2-3 beers daily, denies illicit drug use, and denies ever experiencing withdrawal symptoms.  Interim history Admitted with sepsis secondary to multifocal pneumonia.  Found to also have acute hypoxic respiratory failure as well as acute kidney injury, gastritis with anemia. Assessment & Plan   Acute hypoxic respiratory failure  -Patient noted to have hypoxia on admission, oxygen saturations at rest 87% -secondary to Legionella pneumonia, ARDS  -Influenza PCR negative -Continue to treat underlying condition -  Septic shock secondary to Legionella pneumonia and ARDS -Patient presented with fever, tachycardia, tachypnea, hypoxia -Initially placed on azithromycin, ceftriaxone followed by Zosyn and vancomycin, eventually transitioned to IV Levaquin and completed course of antibiotics -Patient required IV pressors and was admitted to  the ICU.  Was also placed on IV stress dose steroids while in the ICU which have been discontinued -Blood pressure does appear to be stable -Appears patient had hypoxia that required pronating position  Acute toxic and metabolic encephalopathy -Altered factorial including ICU/hospital induced delirium, uremia, hypoxic injury, medications, sepsis -Speech therapy consulted for cognitive and swallowing evaluation  Acute kidney injury with mixed anion gap and non-anion gap metabolic and respiratory acidosis -Nephrology was consulted and appreciated -Secondary to prerenal etiology, ATN and hemodynamic disturbances -Patient did require CRRT in the ICU -Currently requiring intermittent hemodialysis as per nephrology  Dysphasia -Speech therapy consulted -Patient did have core track for nutrition -Barium swallow suggested high aspiration risk and recommended for the patient remain n.p.o.  Normocytic Anemia/GI bleeding -Anemia likely multifactorial due to acute blood loss secondary to GI bleeding, critical illness, sepsis and renal disease -On admission, patient reported epigastric pain with melena after taking Advil every 4 hours for fever and aches. -FOBT positive -Continue PPI twice daily -Patient does not appear to have any active GI bleeding, GI was not consulted -Hemoglobin dropped to 6.8 this morning, transfuse 1 unit PRBC -Continue to monitor  Atrial fibrillation with RVR -Appears paroxysmal, currently patient in sinus rhythm -Was on amiodarone for rate control -Anticoagulation was deferred secondary to GI bleed -We will likely not need anticoagulation moving forward given that patient does not appear to have further episodes of atrial fibrillation  Elevated LFTs -Secondary to sepsis vs medications vs a?lcohol use, -Upon review of chart, appears to have trended downward and currently normalized  Bilateral leg pain -CK normal.  Etiology unclear -Continue symptomatic treatment,  pain control  Diarrhea -Suspect secondary to tube feeding and antibiotic use -Currently no leukocytosis, no abdominal distention  nausea or vomiting -Does not appear to be C. difficile, patient was placed on Imodium -Abdominal x-ray showed no acute abnormality  Left upper extremity pain/upper extremity basilic SVT -Mild edema noted -Currently not at risk for further worsening of DVT.  Given bleeding risk anticoagulation held -Treat with warm compresses  Pressure injury -Unstageable, on penis. -Continue wound care -not present on admission  Deconditioning -PT rec CIR -Inpatient rehab consulted  DVT Prophylaxis  SCDs  Code Status: Full  Family Communication: Wife at bedside  Disposition Plan: Admitted. Dispo pending.  Inpatient rehab consulted  Consultants PCCM Nephrology Inpatient rehab  Procedures  Intubation extubation, 12/9 -12/18 Left IJ CVL Right IJ HD cath  Antibiotics   Anti-infectives (From admission, onward)   Start     Dose/Rate Route Frequency Ordered Stop   02/19/18 1000  levofloxacin (LEVAQUIN) IVPB 500 mg     500 mg 100 mL/hr over 60 Minutes Intravenous Every 48 hours 02/17/18 1106 02/23/18 1859   02/14/18 0900  Levofloxacin (LEVAQUIN) IVPB 250 mg  Status:  Discontinued     250 mg 50 mL/hr over 60 Minutes Intravenous Every 24 hours 02/13/18 0815 02/17/18 1106   02/13/18 0830  levofloxacin (LEVAQUIN) IVPB 500 mg     500 mg 100 mL/hr over 60 Minutes Intravenous  Once 02/13/18 0815 02/13/18 1150   02/12/18 2000  vancomycin (VANCOCIN) 1,500 mg in sodium chloride 0.9 % 500 mL IVPB  Status:  Discontinued     1,500 mg 250 mL/hr over 120 Minutes Intravenous Every 24 hours 02/11/18 1406 02/11/18 1521   02/12/18 2000  piperacillin-tazobactam (ZOSYN) IVPB 3.375 g  Status:  Discontinued     3.375 g 100 mL/hr over 30 Minutes Intravenous Every 6 hours 02/12/18 1408 02/13/18 0815   02/10/18 2000  vancomycin (VANCOCIN) 1,250 mg in sodium chloride 0.9 % 250 mL  IVPB  Status:  Discontinued     1,250 mg 166.7 mL/hr over 90 Minutes Intravenous Every 24 hours 02/09/18 1839 02/10/18 0728   02/10/18 1530  vancomycin (VANCOCIN) IVPB 750 mg/150 ml premix  Status:  Discontinued     750 mg 150 mL/hr over 60 Minutes Intravenous Every 12 hours 02/10/18 1432 02/11/18 1406   02/10/18 1400  piperacillin-tazobactam (ZOSYN) IVPB 3.375 g  Status:  Discontinued     3.375 g 12.5 mL/hr over 240 Minutes Intravenous Every 8 hours 02/10/18 0731 02/12/18 1408   02/10/18 0745  piperacillin-tazobactam (ZOSYN) IVPB 3.375 g     3.375 g 100 mL/hr over 30 Minutes Intravenous  Once 02/10/18 0728 02/10/18 0921   02/10/18 0600  ceFEPIme (MAXIPIME) 2 g in sodium chloride 0.9 % 100 mL IVPB  Status:  Discontinued     2 g 200 mL/hr over 30 Minutes Intravenous Every 12 hours 02/09/18 1839 02/09/18 2014   02/10/18 0200  cefTRIAXone (ROCEPHIN) 1 g in sodium chloride 0.9 % 100 mL IVPB  Status:  Discontinued     1 g 200 mL/hr over 30 Minutes Intravenous Every 24 hours 02/09/18 2014 02/10/18 0728   02/09/18 2015  azithromycin (ZITHROMAX) 500 mg in sodium chloride 0.9 % 250 mL IVPB  Status:  Discontinued     500 mg 250 mL/hr over 60 Minutes Intravenous Every 24 hours 02/09/18 2014 02/12/18 1306   02/09/18 1830  vancomycin (VANCOCIN) 1,250 mg in sodium chloride 0.9 % 250 mL IVPB     1,250 mg 166.7 mL/hr over 90 Minutes Intravenous  Once 02/09/18 1800 02/09/18 2127   02/09/18 1800  ceFEPIme (MAXIPIME) 2  g in sodium chloride 0.9 % 100 mL IVPB     2 g 200 mL/hr over 30 Minutes Intravenous  Once 02/09/18 1745 02/09/18 1925   02/09/18 1800  metroNIDAZOLE (FLAGYL) IVPB 500 mg  Status:  Discontinued     500 mg 100 mL/hr over 60 Minutes Intravenous Every 8 hours 02/09/18 1745 02/10/18 0728   02/09/18 1800  vancomycin (VANCOCIN) IVPB 1000 mg/200 mL premix  Status:  Discontinued     1,000 mg 200 mL/hr over 60 Minutes Intravenous  Once 02/09/18 1745 02/09/18 1800      Subjective:   [name] seen and examined today.  No complaints. Per wife, patient did not rest well overnight.   Objective:   Vitals:   02/25/18 1654 02/25/18 2126 02/25/18 2342 02/26/18 0806  BP: 115/87  (!) 95/59 105/70  Pulse: 92  86 74  Resp:   16 12  Temp: 98.1 F (36.7 C)  98.6 F (37 C) 97.9 F (36.6 C)  TempSrc: Oral  Oral Oral  SpO2: 99% 96% 100% 100%  Weight:      Height:        Intake/Output Summary (Last 24 hours) at 02/26/2018 1119 Last data filed at 02/26/2018 1117 Gross per 24 hour  Intake 160 ml  Output 2030 ml  Net -1870 ml   Filed Weights   02/25/18 0630 02/25/18 1130 02/25/18 1502  Weight: 67.5 kg 64.6 kg 63.7 kg    Exam  General: Well developed, thin, NAD  HEENT: NCAT, mucous membranes moist.   Neck: Supple  Cardiovascular: S1 S2 auscultated, RRR, no murmur  Respiratory: Diminished breath sounds   Abdomen: Soft, nontender, nondistended, + bowel sounds  Extremities: warm dry without cyanosis clubbing. Trace pedal edema  Neuro: Sleeping, AAOx3, nonfocal   Data Reviewed: I have personally reviewed following labs and imaging studies  CBC: Recent Labs  Lab 02/21/18 0512 02/22/18 0631 02/23/18 0642 02/25/18 0500 02/26/18 0430  WBC 8.9 9.0 10.0 13.0* 11.4*  HGB 7.5* 7.4* 7.4* 7.2* 6.8*  HCT 22.9* 23.4* 23.3* 22.4* 22.1*  MCV 93.9 93.6 94.3 94.1 96.9  PLT 244 209 230 PLATELET CLUMPS NOTED ON SMEAR, COUNT APPEARS DECREASED 150   Basic Metabolic Panel: Recent Labs  Lab 02/21/18 0512 02/22/18 0631 02/23/18 0642 02/24/18 0559 02/25/18 0500 02/26/18 0430  NA  --  136 138 137 132* 136  K  --  3.9 4.4 4.4 4.6 4.1  CL  --  98 99 96* 91* 96*  CO2  --  24 23 27 26 28   GLUCOSE  --  116* 107* 105* 102* 97  BUN  --  75* 99* 46* 68* 33*  CREATININE  --  6.30* 8.02* 4.95* 6.72* 4.16*  CALCIUM  --  8.0* 8.1* 8.0* 7.8* 8.1*  MG 2.1 2.2  --   --   --   --   PHOS  --  6.9* 7.9* 5.6* 8.0* 5.9*   GFR: Estimated Creatinine Clearance: 19.4 mL/min (A) (by C-G  formula based on SCr of 4.16 mg/dL (H)). Liver Function Tests: Recent Labs  Lab 02/22/18 0631 02/23/18 1610 02/24/18 0559 02/25/18 0500 02/26/18 0430  AST 33  --   --   --   --   ALT 61*  --   --   --   --   ALKPHOS 117  --   --   --   --   BILITOT 0.4  --   --   --   --  PROT 5.7*  --   --   --   --   ALBUMIN 1.6* 1.6* 1.7* 1.7* 1.7*   No results for input(s): LIPASE, AMYLASE in the last 168 hours. No results for input(s): AMMONIA in the last 168 hours. Coagulation Profile: No results for input(s): INR, PROTIME in the last 168 hours. Cardiac Enzymes: Recent Labs  Lab 02/21/18 1120  CKTOTAL 38*   BNP (last 3 results) No results for input(s): PROBNP in the last 8760 hours. HbA1C: No results for input(s): HGBA1C in the last 72 hours. CBG: Recent Labs  Lab 02/25/18 1655 02/25/18 2009 02/25/18 2338 02/26/18 0427 02/26/18 0805  GLUCAP 126* 100* 102* 91 96   Lipid Profile: No results for input(s): CHOL, HDL, LDLCALC, TRIG, CHOLHDL, LDLDIRECT in the last 72 hours. Thyroid Function Tests: No results for input(s): TSH, T4TOTAL, FREET4, T3FREE, THYROIDAB in the last 72 hours. Anemia Panel: No results for input(s): VITAMINB12, FOLATE, FERRITIN, TIBC, IRON, RETICCTPCT in the last 72 hours. Urine analysis:    Component Value Date/Time   COLORURINE AMBER (A) 02/09/2018 1816   APPEARANCEUR CLEAR 02/09/2018 1816   LABSPEC 1.019 02/09/2018 1816   PHURINE 6.0 02/09/2018 1816   GLUCOSEU NEGATIVE 02/09/2018 1816   HGBUR SMALL (A) 02/09/2018 1816   BILIRUBINUR NEGATIVE 02/09/2018 1816   KETONESUR NEGATIVE 02/09/2018 1816   PROTEINUR 100 (A) 02/09/2018 1816   UROBILINOGEN 1.0 10/21/2014 0951   NITRITE NEGATIVE 02/09/2018 1816   LEUKOCYTESUR NEGATIVE 02/09/2018 1816   Sepsis Labs: @LABRCNTIP (procalcitonin:4,lacticidven:4)  )No results found for this or any previous visit (from the past 240 hour(s)).    Radiology Studies: Dg Abd Portable 1v  Result Date:  02/25/2018 CLINICAL DATA:  Nausea EXAM: PORTABLE ABDOMEN - 1 VIEW COMPARISON:  02/09/2018 FINDINGS: Contrast material is noted throughout colon consistent with the recent modified barium swallow. No obstructive changes are seen. Feeding catheter is noted within the distal stomach. No bony abnormality is seen. IMPRESSION: No acute abnormality noted. Electronically Signed   By: Alcide CleverMark  Lukens M.D.   On: 02/25/2018 20:41   Dg Swallowing Func-speech Pathology  Result Date: 02/24/2018 Objective Swallowing Evaluation: Type of Study: MBS-Modified Barium Swallow Study  Patient Details Name: Roberto Knapp MRN: [id number] Date of Birth: [birth year] Today's Date: 02/24/2018 Time: SLP Start Time (ACUTE ONLY): 1127 -SLP Stop Time (ACUTE ONLY): 1156 SLP Time Calculation (min) (ACUTE ONLY): 29 min Past Medical History: Past Medical History: Diagnosis Date . Drug overdose, intentional [facility](HCC)  Past Surgical History: Past Surgical History: Procedure Laterality Date . FASCIOTOMY  09/08/2011  Procedure: FASCIOTOMY;  Surgeon: Sherren Kernsharles E Fields, MD;  [facility];  Service: Vascular;  Laterality: Left; . FEMORAL-POPLITEAL BYPASS GRAFT  09/08/2011  Procedure: BYPASS GRAFT FEMORAL-POPLITEAL ARTERY;  Surgeon: Sherren Kernsharles E Fields, MD;  [facility];  Service: Vascular;  Laterality: Left; . NO PAST SURGERIES   HPI: 47 yo male admitted 02/09/18 w/ CAP, legionella infection, probable gastritis (from NSAIDS) and progressive hypoxic resp failure. Developed severe ARDS requiring ETT 12/9-12/18, paralysis, and proning. PMH: intentional overdose. CXR = Diffuse right lung airspace disease and left lower lobe airspace opacity, worsened since last CXR  Subjective: pt pleasant, confused, makes jokes when he does not seem to know an answer Assessment / Plan / Recommendation CHL IP CLINICAL IMPRESSIONS 02/24/2018 Clinical Impression Pt has mild, generalized oropharyngeal weakness that allows for reduced lingual propulsion orally but also with reduced base of  tongue retraction, hyolaryngeal movement, and epiglottic inversion. His oral clearance is adequate and he has only  trace to mild amounts of vallecular residue. He has trace amounts of penetration during the swallow with all consistencies tested except for honey thick liquids by tsp. The amount of other consistencies penetrated is very small, but unfortunately he cannot clear them with a cued cough, and even during testing they begin to fall to the true vocal folds without sensation. Suspect that across a meal tray, aspiration would be likely. Recommend SLP f/u for introduction of therapeutic trials of honey thick liquids by spoon and use of strengthening exercises (RMT?) to facilitate return to POs, with prognosis good given additional time post-extubation and improved strength of cough. SLP Visit Diagnosis Dysphagia, oropharyngeal phase (R13.12) Attention and concentration deficit following -- Frontal lobe and executive function deficit following -- Impact on safety and function Moderate aspiration risk   CHL IP TREATMENT RECOMMENDATION 02/24/2018 Treatment Recommendations Therapy as outlined in treatment plan below   Prognosis 02/24/2018 Prognosis for Safe Diet Advancement Good Barriers to Reach Goals Cognitive deficits Barriers/Prognosis Comment -- CHL IP DIET RECOMMENDATION 02/24/2018 SLP Diet Recommendations NPO;Alternative means - temporary Liquid Administration via -- Medication Administration Via alternative means Compensations -- Postural Changes --   CHL IP OTHER RECOMMENDATIONS 02/24/2018 Recommended Consults -- Oral Care Recommendations Oral care QID Other Recommendations --   CHL IP FOLLOW UP RECOMMENDATIONS 02/24/2018 Follow up Recommendations Skilled Nursing facility;LTACH   CHL IP FREQUENCY AND DURATION 02/24/2018 Speech Therapy Frequency (ACUTE ONLY) min 2x/week Treatment Duration 2 weeks      CHL IP ORAL PHASE 02/24/2018 Oral Phase Impaired Oral - Pudding Teaspoon -- Oral - Pudding Cup -- Oral -  Honey Teaspoon Delayed oral transit;Weak lingual manipulation Oral - Honey Cup -- Oral - Nectar Teaspoon Delayed oral transit;Weak lingual manipulation Oral - Nectar Cup -- Oral - Nectar Straw -- Oral - Thin Teaspoon Weak lingual manipulation Oral - Thin Cup Weak lingual manipulation Oral - Thin Straw -- Oral - Puree Weak lingual manipulation;Delayed oral transit Oral - Mech Soft -- Oral - Regular -- Oral - Multi-Consistency -- Oral - Pill -- Oral Phase - Comment --  CHL IP PHARYNGEAL PHASE 02/24/2018 Pharyngeal Phase Impaired Pharyngeal- Pudding Teaspoon -- Pharyngeal -- Pharyngeal- Pudding Cup -- Pharyngeal -- Pharyngeal- Honey Teaspoon Reduced epiglottic inversion;Reduced anterior laryngeal mobility;Reduced laryngeal elevation;Reduced tongue base retraction;Pharyngeal residue - valleculae Pharyngeal -- Pharyngeal- Honey Cup -- Pharyngeal -- Pharyngeal- Nectar Teaspoon Reduced epiglottic inversion;Reduced anterior laryngeal mobility;Reduced laryngeal elevation;Reduced tongue base retraction;Pharyngeal residue - valleculae;Penetration/Aspiration during swallow Pharyngeal Material enters airway, CONTACTS cords and not ejected out Pharyngeal- Nectar Cup -- Pharyngeal -- Pharyngeal- Nectar Straw -- Pharyngeal -- Pharyngeal- Thin Teaspoon Reduced epiglottic inversion;Reduced anterior laryngeal mobility;Reduced laryngeal elevation;Reduced tongue base retraction Pharyngeal -- Pharyngeal- Thin Cup Reduced epiglottic inversion;Reduced anterior laryngeal mobility;Reduced laryngeal elevation;Reduced tongue base retraction;Pharyngeal residue - valleculae;Penetration/Aspiration during swallow Pharyngeal Material enters airway, CONTACTS cords and not ejected out Pharyngeal- Thin Straw -- Pharyngeal -- Pharyngeal- Puree Reduced epiglottic inversion;Reduced anterior laryngeal mobility;Reduced laryngeal elevation;Reduced tongue base retraction;Pharyngeal residue - valleculae;Penetration/Aspiration during swallow Pharyngeal  Material enters airway, remains ABOVE vocal cords and not ejected out Pharyngeal- Mechanical Soft -- Pharyngeal -- Pharyngeal- Regular -- Pharyngeal -- Pharyngeal- Multi-consistency -- Pharyngeal -- Pharyngeal- Pill -- Pharyngeal -- Pharyngeal Comment --  CHL IP CERVICAL ESOPHAGEAL PHASE 02/24/2018 Cervical Esophageal Phase WFL Pudding Teaspoon -- Pudding Cup -- Honey Teaspoon -- Honey Cup -- Nectar Teaspoon -- Nectar Cup -- Nectar Straw -- Thin Teaspoon -- Thin Cup -- Thin Straw -- Puree -- Mechanical Soft -- Regular -- Multi-consistency -- Pill --  Cervical Esophageal Comment -- Maxcine Hamaiewonsky, Laura 02/24/2018, 1:23 PM  Maxcine HamLaura Paiewonsky, M.A. CCC-SLP Acute Rehabilitation Services Pager [phone] Office [phone]               Scheduled Meds: . ALPRAZolam  0.25 mg Oral Once  . bacitracin   Topical BID  . Chlorhexidine Gluconate Cloth  6 each Topical Q0600  . Chlorhexidine Gluconate Cloth  6 each Topical Q0600  . darbepoetin (ARANESP) injection - NON-DIALYSIS  100 mcg Subcutaneous Q Tue-1800  . guaiFENesin  10 mL Per Tube TID  . heparin injection (subcutaneous)  5,000 Units Subcutaneous Q8H  . Influenza vac split quadrivalent PF  0.5 mL Intramuscular Tomorrow-1000  . levalbuterol  0.63 mg Nebulization Q8H  . pantoprazole sodium  40 mg Per Tube Daily  . sodium chloride flush  10-40 mL Intracatheter Q12H   Continuous Infusions: . sodium chloride Stopped (02/21/18 1536)  . feeding supplement (NEPRO CARB STEADY) Stopped (02/26/18 0300)     LOS: 17 days   Time Spent in minutes   45 minutes  Fauna Neuner D.O. on 02/26/2018 at 11:19 AM  Between 7am to 7pm - Please see pager noted on [email or web address]  After 7pm go to [email or web address]  And look for the night coverage person covering for me after hours  [facility] Office  [phone]

## 2018-02-26 NOTE — Progress Notes (Signed)
CRITICAL VALUE ALERT  Critical Value:  HGB 6.8  Date & Time Notied:  02/26/18 0530  Provider Notified: 02/26/18 0534  Orders Received/Actions taken:

## 2018-02-26 NOTE — Progress Notes (Signed)
[facility] ROUNDING NOTE   Subjective:   Appears to be stable this morning.  Nondistressed.  Dialysis 02/25/2018 for clearance only  Removal of 750 cc of fluid with dialysis 02/25/2018   urine output appears to be increasing.  1225 cc recorded 02/25/2018 weight 63.7 kg  Blood pressure 105/70 pulse 74 temperature 97.9 O2 sats 100% 3 L nasal cannula  Sodium 136 potassium 4.1 chloride 96 CO2 28 BUN 33 creatinine 4.16 glucose 97 calcium 8.1 phosphorus 5.9 Albumin 1.7 WBC 11.4 hemoglobin 6.8 platelets 150  Chest x-ray showed slightly worsening bilateral patchy airspace disease right worse than left multifocal infection thought to be likely 02/22/2018     Objective:  Vital signs in last 24 hours:  Temp:  [97.8 F (36.6 C)-98.6 F (37 C)] 97.9 F (36.6 C) (12/25 0806) Pulse Rate:  [68-92] 74 (12/25 0806) Resp:  [12-18] 12 (12/25 0806) BP: (95-115)/(59-87) 105/70 (12/25 0806) SpO2:  [96 %-100 %] 100 % (12/25 0806) Weight:  [63.7 kg-64.6 kg] 63.7 kg (12/24 1502)  Weight change: -2.9 kg Filed Weights   02/25/18 0630 02/25/18 1130 02/25/18 1502  Weight: 67.5 kg 64.6 kg 63.7 kg    Intake/Output: I/O last 3 completed shifts: In: 350 [NG/GT:350] Out: 2030 [Urine:1250; Other:750; Stool:30]   Intake/Output this shift:  No intake/output data recorded. Alert awake and oriented mucosa moist CVS- RRR JVP not elevated RS- CTA diminished air entry no crackles no wheezes ABD- BS present soft non-distended EXT-bilateral trace peripheral edema   Basic Metabolic Panel: Recent Labs  Lab 02/21/18 0512 02/22/18 0631 02/23/18 0642 02/24/18 0559 02/25/18 0500 02/26/18 0430  NA  --  136 138 137 132* 136  K  --  3.9 4.4 4.4 4.6 4.1  CL  --  98 99 96* 91* 96*  CO2  --  _0 GLUCOSE  --  116* 107* 105* 102* 97  BUN  --  75* 99* 46* 68* 33*  CREATININE  --  6.30* 8.02* 4.95* 6.72* 4.16*  CALCIUM  --  8.0* 8.1* 8.0* 7.8* 8.1*  MG 2.1 2.2  --   --   --   --    PHOS  --  6.9* 7.9* 5.6* 8.0* 5.9*    Liver Function Tests: Recent Labs  Lab 02/22/18 0631 02/23/18 0642 02/24/18 0559 02/25/18 0500 02/26/18 0430  AST 33  --   --   --   --   ALT 61*  --   --   --   --   ALKPHOS 117  --   --   --   --   BILITOT 0.4  --   --   --   --   PROT 5.7*  --   --   --   --   ALBUMIN 1.6* 1.6* 1.7* 1.7* 1.7*   No results for input(s): LIPASE, AMYLASE in the last 168 hours. No results for input(s): AMMONIA in the last 168 hours.  CBC: Recent Labs  Lab 02/21/18 0512 02/22/18 0631 02/23/18 0642 02/25/18 0500 02/26/18 0430  WBC 8.9 9.0 10.0 13.0* 11.4*  HGB 7.5* 7.4* 7.4* 7.2* 6.8*  HCT 22.9* 23.4* 23.3* 22.4* 22.1*  MCV 93.9 93.6 94.3 94.1 96.9  PLT 244 209 230 PLATELET CLUMPS NOTED ON SMEAR, COUNT APPEARS DECREASED 150    Cardiac Enzymes: Recent Labs  Lab 02/21/18 1120  CKTOTAL 38*    BNP: Invalid input(s): POCBNP  CBG: Recent Labs  Lab 02/25/18 1655 02/25/18 2009 02/25/18 2338  02/26/18 0427 02/26/18 0805  GLUCAP 126* 100* 102* 91 96    Microbiology: Results for orders placed or performed during the hospital encounter of 02/09/18  Culture, blood (routine x 2)     Status: None   Collection Time: 02/09/18  5:30 PM  Result Value Ref Range Status   Specimen Description BLOOD LEFT ANTECUBITAL  Final   Special Requests   Final    BOTTLES DRAWN AEROBIC AND ANAEROBIC Blood Culture adequate volume   Culture   Final    NO GROWTH 5 DAYS Performed at [facility], [place] [address] [place], [place] [address]    Report Status 02/14/2018 FINAL  Final  Urine culture     Status: None   Collection Time: 02/09/18  6:16 PM  Result Value Ref Range Status   Specimen Description URINE, RANDOM  Final   Special Requests NONE  Final   Culture   Final    NO GROWTH Performed at [facility], [place] [address] [place], [place] [address]    Report Status 02/10/2018 FINAL  Final  Culture, blood (routine x 2)     Status: None    Collection Time: 02/09/18  6:16 PM  Result Value Ref Range Status   Specimen Description BLOOD BLOOD RIGHT WRIST  Final   Special Requests   Final    BOTTLES DRAWN AEROBIC AND ANAEROBIC Blood Culture adequate volume   Culture   Final    NO GROWTH 5 DAYS Performed at [facility], [place] [address], [place], [place] [address]    Report Status 02/14/2018 FINAL  Final  Gastrointestinal Panel by PCR , Stool     Status: None   Collection Time: 02/09/18  6:36 PM  Result Value Ref Range Status   Campylobacter species NOT DETECTED NOT DETECTED Final   Plesimonas shigelloides NOT DETECTED NOT DETECTED Final   Salmonella species NOT DETECTED NOT DETECTED Final   Yersinia enterocolitica NOT DETECTED NOT DETECTED Final   Vibrio species NOT DETECTED NOT DETECTED Final   Vibrio cholerae NOT DETECTED NOT DETECTED Final   Enteroaggregative E coli (EAEC) NOT DETECTED NOT DETECTED Final   Enteropathogenic E coli (EPEC) NOT DETECTED NOT DETECTED Final   Enterotoxigenic E coli (ETEC) NOT DETECTED NOT DETECTED Final   Shiga like toxin producing E coli (STEC) NOT DETECTED NOT DETECTED Final   Shigella/Enteroinvasive E coli (EIEC) NOT DETECTED NOT DETECTED Final   Cryptosporidium NOT DETECTED NOT DETECTED Final   Cyclospora cayetanensis NOT DETECTED NOT DETECTED Final   Entamoeba histolytica NOT DETECTED NOT DETECTED Final   Giardia lamblia NOT DETECTED NOT DETECTED Final   Adenovirus F40/41 NOT DETECTED NOT DETECTED Final   Astrovirus NOT DETECTED NOT DETECTED Final   Norovirus GI/GII NOT DETECTED NOT DETECTED Final   Rotavirus A NOT DETECTED NOT DETECTED Final   Sapovirus (I, II, IV, and V) NOT DETECTED NOT DETECTED Final    Comment: Performed at [facility], [place] [place], [place] [address]  C difficile quick scan w PCR reflex     Status: None   Collection Time: 02/09/18  6:36 PM  Result Value Ref Range Status   C Diff antigen NEGATIVE NEGATIVE Final   C Diff toxin NEGATIVE  NEGATIVE Final   C Diff interpretation No C. difficile detected.  Final    Comment: Performed at [facility], [place] [address], [place], [place] [address]  MRSA PCR Screening     Status: None  Collection Time: 02/10/18  1:34 AM  Result Value Ref Range Status   MRSA by PCR NEGATIVE NEGATIVE Final    Comment:        The GeneXpert MRSA Assay (FDA approved for NASAL specimens only), is one component of a comprehensive MRSA colonization surveillance program. It is not intended to diagnose MRSA infection nor to guide or monitor treatment for MRSA infections. Performed at [facility], [place] [address], [place], [place] [address]   Culture, bal-quantitative     Status: None   Collection Time: 02/10/18  1:26 PM  Result Value Ref Range Status   Specimen Description BRONCHIAL ALVEOLAR LAVAGE  Final   Special Requests Normal  Final   Gram Stain   Final    FEW WBC PRESENT,BOTH PMN AND MONONUCLEAR NO ORGANISMS SEEN    Culture   Final    NO GROWTH 2 DAYS Performed at [facility], [place] [address] [place], [place] [address]    Report Status 02/12/2018 FINAL  Final  Pneumocystis smear by DFA     Status: None   Collection Time: 02/10/18  1:26 PM  Result Value Ref Range Status   Specimen Source-PJSRC BRONCHIAL ALVEOLAR LAVAGE  Final   Pneumocystis jiroveci Ag NEGATIVE  Final    Comment: Performed at [facility] Performed at [facility], [address], [place], [place] [address]     Coagulation Studies: No results for input(s): LABPROT, INR in the last 72 hours.  Urinalysis: No results for input(s): COLORURINE, LABSPEC, PHURINE, GLUCOSEU, HGBUR, BILIRUBINUR, KETONESUR, PROTEINUR, UROBILINOGEN, NITRITE, LEUKOCYTESUR in the last 72 hours.  Invalid input(s): APPERANCEUR    Imaging: Dg Abd Portable 1v  Result Date: 02/25/2018 CLINICAL DATA:  Nausea EXAM: PORTABLE ABDOMEN - 1 VIEW COMPARISON:  02/09/2018 FINDINGS: Contrast material is noted throughout  colon consistent with the recent modified barium swallow. No obstructive changes are seen. Feeding catheter is noted within the distal stomach. No bony abnormality is seen. IMPRESSION: No acute abnormality noted. Electronically Signed   By: Inez Catalina M.D.   On: 02/25/2018 20:41   Dg Swallowing Func-speech Pathology  Result Date: 02/24/2018 Objective Swallowing Evaluation: Type of Study: MBS-Modified Barium Swallow Study  Patient Details Name: Roberto Knapp MRN: [id number] Date of Birth: February 03, 1971 Today's Date: 02/24/2018 Time: SLP Start Time (ACUTE ONLY): 1127 -SLP Stop Time (ACUTE ONLY): 2376 SLP Time Calculation (min) (ACUTE ONLY): 29 min Past Medical History: Past Medical History: Diagnosis Date . Drug overdose, intentional [facility])  Past Surgical History: Past Surgical History: Procedure Laterality Date . FASCIOTOMY  09/08/2011  Procedure: FASCIOTOMY;  Surgeon: Elam Dutch, MD;  Location: [facility];  Service: Vascular;  Laterality: Left; . FEMORAL-POPLITEAL BYPASS GRAFT  09/08/2011  Procedure: BYPASS GRAFT FEMORAL-POPLITEAL ARTERY;  Surgeon: Elam Dutch, MD;  Location: [facility] OR;  Service: Vascular;  Laterality: Left; . NO PAST SURGERIES   HPI: 47 yo male admitted 02/09/18 w/ CAP, legionella infection, probable gastritis (from NSAIDS) and progressive hypoxic resp failure. Developed severe ARDS requiring ETT 12/9-12/18, paralysis, and proning. PMH: intentional overdose. CXR = Diffuse right lung airspace disease and left lower lobe airspace opacity, worsened since last CXR  Subjective: pt pleasant, confused, makes jokes when he does not seem to know an answer Assessment / Plan / Recommendation CHL IP CLINICAL IMPRESSIONS 02/24/2018 Clinical Impression Pt has mild, generalized oropharyngeal weakness that allows for reduced lingual propulsion orally but also with reduced base of tongue retraction, hyolaryngeal movement, and epiglottic inversion. His  oral clearance is adequate and he has only trace to mild amounts of  vallecular residue. He has trace amounts of penetration during the swallow with all consistencies tested except for honey thick liquids by tsp. The amount of other consistencies penetrated is very small, but unfortunately he cannot clear them with a cued cough, and even during testing they begin to fall to the true vocal folds without sensation. Suspect that across a meal tray, aspiration would be likely. Recommend SLP f/u for introduction of therapeutic trials of honey thick liquids by spoon and use of strengthening exercises (RMT?) to facilitate return to POs, with prognosis good given additional time post-extubation and improved strength of cough. SLP Visit Diagnosis Dysphagia, oropharyngeal phase (R13.12) Attention and concentration deficit following -- Frontal lobe and executive function deficit following -- Impact on safety and function Moderate aspiration risk   CHL IP TREATMENT RECOMMENDATION 02/24/2018 Treatment Recommendations Therapy as outlined in treatment plan below   Prognosis 02/24/2018 Prognosis for Safe Diet Advancement Good Barriers to Reach Goals Cognitive deficits Barriers/Prognosis Comment -- CHL IP DIET RECOMMENDATION 02/24/2018 SLP Diet Recommendations NPO;Alternative means - temporary Liquid Administration via -- Medication Administration Via alternative means Compensations -- Postural Changes --   CHL IP OTHER RECOMMENDATIONS 02/24/2018 Recommended Consults -- Oral Care Recommendations Oral care QID Other Recommendations --   CHL IP FOLLOW UP RECOMMENDATIONS 02/24/2018 Follow up Recommendations Skilled Nursing facility;LTACH   CHL IP FREQUENCY AND DURATION 02/24/2018 Speech Therapy Frequency (ACUTE ONLY) min 2x/week Treatment Duration 2 weeks      CHL IP ORAL PHASE 02/24/2018 Oral Phase Impaired Oral - Pudding Teaspoon -- Oral - Pudding Cup -- Oral - Honey Teaspoon Delayed oral transit;Weak lingual manipulation Oral - Honey Cup -- Oral - Nectar Teaspoon Delayed oral transit;Weak lingual  manipulation Oral - Nectar Cup -- Oral - Nectar Straw -- Oral - Thin Teaspoon Weak lingual manipulation Oral - Thin Cup Weak lingual manipulation Oral - Thin Straw -- Oral - Puree Weak lingual manipulation;Delayed oral transit Oral - Mech Soft -- Oral - Regular -- Oral - Multi-Consistency -- Oral - Pill -- Oral Phase - Comment --  CHL IP PHARYNGEAL PHASE 02/24/2018 Pharyngeal Phase Impaired Pharyngeal- Pudding Teaspoon -- Pharyngeal -- Pharyngeal- Pudding Cup -- Pharyngeal -- Pharyngeal- Honey Teaspoon Reduced epiglottic inversion;Reduced anterior laryngeal mobility;Reduced laryngeal elevation;Reduced tongue base retraction;Pharyngeal residue - valleculae Pharyngeal -- Pharyngeal- Honey Cup -- Pharyngeal -- Pharyngeal- Nectar Teaspoon Reduced epiglottic inversion;Reduced anterior laryngeal mobility;Reduced laryngeal elevation;Reduced tongue base retraction;Pharyngeal residue - valleculae;Penetration/Aspiration during swallow Pharyngeal Material enters airway, CONTACTS cords and not ejected out Pharyngeal- Nectar Cup -- Pharyngeal -- Pharyngeal- Nectar Straw -- Pharyngeal -- Pharyngeal- Thin Teaspoon Reduced epiglottic inversion;Reduced anterior laryngeal mobility;Reduced laryngeal elevation;Reduced tongue base retraction Pharyngeal -- Pharyngeal- Thin Cup Reduced epiglottic inversion;Reduced anterior laryngeal mobility;Reduced laryngeal elevation;Reduced tongue base retraction;Pharyngeal residue - valleculae;Penetration/Aspiration during swallow Pharyngeal Material enters airway, CONTACTS cords and not ejected out Pharyngeal- Thin Straw -- Pharyngeal -- Pharyngeal- Puree Reduced epiglottic inversion;Reduced anterior laryngeal mobility;Reduced laryngeal elevation;Reduced tongue base retraction;Pharyngeal residue - valleculae;Penetration/Aspiration during swallow Pharyngeal Material enters airway, remains ABOVE vocal cords and not ejected out Pharyngeal- Mechanical Soft -- Pharyngeal -- Pharyngeal- Regular --  Pharyngeal -- Pharyngeal- Multi-consistency -- Pharyngeal -- Pharyngeal- Pill -- Pharyngeal -- Pharyngeal Comment --  CHL IP CERVICAL ESOPHAGEAL PHASE 02/24/2018 Cervical Esophageal Phase WFL Pudding Teaspoon -- Pudding Cup -- Honey Teaspoon -- Honey Cup -- Nectar Teaspoon -- Nectar Cup -- Nectar Straw -- Thin Teaspoon -- Thin Cup -- Thin Straw -- Puree --  Mechanical Soft -- Regular -- Multi-consistency -- Pill -- Cervical Esophageal Comment -- Germain Osgood 02/24/2018, 1:23 PM  Germain Osgood, M.A. CCC-SLP Acute Rehabilitation Services Pager [phone] Office [phone]             Vas [place] Upper Extremity Venous Duplex  Result Date: 02/24/2018 UPPER VENOUS STUDY  Indications: Pain Performing Technologist: June Leap RDMS, RVT  Examination Guidelines: A complete evaluation includes B-mode imaging, spectral Doppler, color Doppler, and power Doppler as needed of all accessible portions of each vessel. Bilateral testing is considered an integral part of a complete examination. Limited examinations for reoccurring indications may be performed as noted.  Right Findings: +----------+------------+----------+---------+-----------+-------+ RIGHT     CompressiblePropertiesPhasicitySpontaneousSummary +----------+------------+----------+---------+-----------+-------+ Subclavian                         Yes       Yes            +----------+------------+----------+---------+-----------+-------+  Left Findings: +----------+------------+----------+---------+-----------+-------+ LEFT      CompressiblePropertiesPhasicitySpontaneousSummary +----------+------------+----------+---------+-----------+-------+ IJV           Full                 Yes       Yes            +----------+------------+----------+---------+-----------+-------+ Subclavian    Full                 Yes       Yes            +----------+------------+----------+---------+-----------+-------+ Axillary      Full                  Yes       Yes            +----------+------------+----------+---------+-----------+-------+ Brachial      Full                 Yes       Yes            +----------+------------+----------+---------+-----------+-------+ Radial        Full                                          +----------+------------+----------+---------+-----------+-------+ Ulnar         Full                                          +----------+------------+----------+---------+-----------+-------+ Cephalic      Full                                          +----------+------------+----------+---------+-----------+-------+ Basilic       None                                   Acute  +----------+------------+----------+---------+-----------+-------+  Summary:  Right: No evidence of thrombosis in the subclavian.  Left: No evidence of deep vein thrombosis in the upper extremity. Findings consistent with acute superficial vein thrombosis involving the left basilic vein.  *See table(s) above for measurements and observations.  Diagnosing physician: Curt Jews MD  Electronically signed by Curt Jews MD on 02/24/2018 at 1:44:18 PM.    Final      Medications:   . sodium chloride Stopped (02/21/18 1536)  . feeding supplement (NEPRO CARB STEADY) Stopped (02/26/18 0300)   . sodium chloride   Intravenous Once  . ALPRAZolam  0.25 mg Oral Once  . bacitracin   Topical BID  . Chlorhexidine Gluconate Cloth  6 each Topical Q0600  . Chlorhexidine Gluconate Cloth  6 each Topical Q0600  . darbepoetin (ARANESP) injection - NON-DIALYSIS  100 mcg Subcutaneous Q Tue-1800  . guaiFENesin  10 mL Per Tube TID  . heparin injection (subcutaneous)  5,000 Units Subcutaneous Q8H  . Influenza vac split quadrivalent PF  0.5 mL Intramuscular Tomorrow-1000  . levalbuterol  0.63 mg Nebulization Q8H  . pantoprazole sodium  40 mg Per Tube Daily  . sodium chloride flush  10-40 mL Intracatheter Q12H   sodium chloride,  oxyCODONE **AND** acetaminophen, acetaminophen, [DISCONTINUED] ondansetron **OR** ondansetron (ZOFRAN) IV, sodium chloride flush, traZODone  Assessment/ Plan:   Acute kidney injury in setting of ATN recent history of nonsteroidal anti-inflammatory drugs and IV contrast was started on CRRT 02/12/2018 to 02/17/2018 and started on intermittent hemodialysis 02/19/2018, 02/20/2018, 02/23/2018 and 02/25/2018.  Vas-Cath appears to have been in place for 14 days .  Urine output slow to improve although is a little more.  We will continue to follow creatinine hopefully if creatinine starts to improve we can remove his Vas-Cath.  Hypertension/volume appears to be stable at this point will continue to follow appears euvolemic  Anemia with darbepoetin 100 mcg 02/18/2018    Acute hypoxic respiratory failure secondary to Legionella ARDS.  Mated with septic shock.  Levaquin has been discontinued  Toxic encephalopathy appears to have improved  Atrial fibrillation with rapid ventricular rate anticoagulation deferred.  No further episodes of atrial fibrillation noted patient now in sinus rhythm  Diarrhea with rectal tube does not appear to be secondary to C. Difficile.  Appears to be improved   electrolytes appear to be stable  Acid-base status stable.    LOS: [place] _0 _1 :58 AM

## 2018-02-27 DIAGNOSIS — R5381 Other malaise: Secondary | ICD-10-CM

## 2018-02-27 LAB — RENAL FUNCTION PANEL
Albumin: 1.8 g/dL — ABNORMAL LOW (ref 3.5–5.0)
Anion gap: 11 (ref 5–15)
BUN: 46 mg/dL — ABNORMAL HIGH (ref 6–20)
CO2: 28 mmol/L (ref 22–32)
Calcium: 8.2 mg/dL — ABNORMAL LOW (ref 8.9–10.3)
Chloride: 93 mmol/L — ABNORMAL LOW (ref 98–111)
Creatinine, Ser: 5.65 mg/dL — ABNORMAL HIGH (ref 0.61–1.24)
GFR calc Af Amer: 13 mL/min — ABNORMAL LOW (ref >60)
GFR calc non Af Amer: 11 mL/min — ABNORMAL LOW (ref >60)
Glucose, Bld: 105 mg/dL — ABNORMAL HIGH (ref 70–99)
Phosphorus: 5.8 mg/dL — ABNORMAL HIGH (ref 2.5–4.6)
Potassium: 3.8 mmol/L (ref 3.5–5.1)
Sodium: 132 mmol/L — ABNORMAL LOW (ref 135–145)

## 2018-02-27 LAB — TYPE AND SCREEN
ABO/RH(D): A POS
Antibody Screen: NEGATIVE
Unit division: 0

## 2018-02-27 LAB — GLUCOSE, CAPILLARY
Glucose-Capillary: 110 mg/dL — ABNORMAL HIGH (ref 70–99)
Glucose-Capillary: 107 mg/dL — ABNORMAL HIGH (ref 70–99)
Glucose-Capillary: 104 mg/dL — ABNORMAL HIGH (ref 70–99)
Glucose-Capillary: 101 mg/dL — ABNORMAL HIGH (ref 70–99)
Glucose-Capillary: 95 mg/dL (ref 70–99)

## 2018-02-27 LAB — CBC
HCT: 24.5 % — ABNORMAL LOW (ref 39.0–52.0)
Hemoglobin: 7.6 g/dL — ABNORMAL LOW (ref 13.0–17.0)
MCH: 29.2 pg (ref 26.0–34.0)
MCHC: 31 g/dL (ref 30.0–36.0)
MCV: 94.2 fL (ref 80.0–100.0)
Platelets: UNDETERMINED 10*3/uL (ref 150–400)
RBC: 2.6 MIL/uL — ABNORMAL LOW (ref 4.22–5.81)
RDW: 14.4 % (ref 11.5–15.5)
WBC: 10.5 10*3/uL (ref 4.0–10.5)
nRBC: 0 % (ref 0.0–0.2)

## 2018-02-27 LAB — BPAM RBC
Blood Product Expiration Date: 202001152359
ISSUE DATE / TIME: 201912251257
Unit Type and Rh: 6200

## 2018-02-27 NOTE — Consult Note (Signed)
Physical Medicine and Rehabilitation Consult Reason for Consult: decreased functional mobility related to sepsis/ARDS Referring Physician: [facility]   HPI: Roberto Knapp is a 47 y.o.right handed male with history of PVD and multiple revascularization procedures, tobacco/alcohol abuse. Per chart review patient lives with spouse independent prior to admission. Presented 02/09/2018 with fevers, generalized aches as well as abdominal pain with nausea vomiting diarrhea cough and shortness of breath. In the ED noted blood pressure 96/63 tachycardia 1:30. Chest x-ray concerning for multifocal patchy opacities in bilateral upper lobes and right lower lobe consistent with pneumonia. CT abdomen pelvis negative. Sodium 121, potassium 3.0, BUN 23, creatinine 1.20, WBC 18,900, urinalysis negative, fecal occult blood positive, troponin negative. Urine drug screen positive THC. Patient placed on sepsis protocol. Patient did require intubation for airway protection evolving ARDS. Persistent elevations in creatinine from 1.20-4.00 with renal services consulted. Acute kidney injury felt to be secondary to ATN. Started on CRRT 02/12/2018 to 02/17/2018 as well as intermittent dialysis and latest session 02/25/2018. Echocardiogram with ejection fraction of 45% grade 2 diastolic dysfunction. Patient remained intubated through 02/19/2018. Subcutaneous heparin added for DVT prophylaxis. Nasogastric tube feeds for nutritional support. Intermittent bouts of atrial fibrillation with RVR was on amiodarone for rate control anticoagulation was deferred secondary to question GI bleed with latest hemoglobin 6.8 await transfusion..WOC follow-up 02/24/2018 for unstageable skin lesion on penis secondary to Foley tube with skin care as directed. Therapy evaluations completed 02/22/2018 and ongoing with recommendations of physical medicine rehabilitation consult.   Review of Systems  Constitutional: Positive for chills, fever and  malaise/fatigue.  HENT: Negative for hearing loss.   Eyes: Negative for blurred vision and double vision.  Respiratory: Positive for cough and shortness of breath.   Cardiovascular: Negative for chest pain, palpitations and leg swelling.  Gastrointestinal: Positive for abdominal pain, diarrhea, nausea and vomiting.  Genitourinary: Negative for dysuria, flank pain and hematuria.  Musculoskeletal: Positive for myalgias.  Skin: Negative for rash.  Neurological: Positive for weakness.  All other systems reviewed and are negative.  Past Medical History:  Diagnosis Date  . Drug overdose, intentional [facility])    Past Surgical History:  Procedure Laterality Date  . FASCIOTOMY  09/08/2011   Procedure: FASCIOTOMY;  Surgeon: Sherren Kerns, MD;  Location: [facility] OR;  Service: Vascular;  Laterality: Left;  . FEMORAL-POPLITEAL BYPASS GRAFT  09/08/2011   Procedure: BYPASS GRAFT FEMORAL-POPLITEAL ARTERY;  Surgeon: Sherren Kerns, MD;  Location: [facility] OR;  Service: Vascular;  Laterality: Left;  . NO PAST SURGERIES     Family History  Problem Relation Age of Onset  . Hypertension Father    Social History:  reports that he has been smoking cigarettes. He has a 12.50 pack-year smoking history. He has never used smokeless tobacco. He reports current alcohol use. He reports that he does not use drugs. Allergies: No Known Allergies Medications Prior to Admission  Medication Sig Dispense Refill  . Acetaminophen (TYLENOL PO) Take 2 tablets by mouth every 6 (six) hours as needed (pain/fever/headache).    [name] Kitchen ibuprofen (ADVIL,MOTRIN) 200 MG tablet Take 400 mg by mouth every 6 (six) hours as needed for fever, headache, mild pain, moderate pain or cramping.    . lisdexamfetamine (VYVANSE) 50 MG capsule Take 50 mg by mouth daily.      Home: Home Living Family/patient expects to be discharged to:: Private residence Living Arrangements: Spouse/significant other Additional Comments: Pt unable to provide home living    Functional History: Prior Function Level  of Independence: Independent Comments: Assume independent Functional Status:  Mobility: Bed Mobility Overal bed mobility: Needs Assistance Bed Mobility: Rolling, Sidelying to Sit Rolling: Min assist Sidelying to sit: Mod assist Supine to sit: Max assist, +2 for physical assistance Sit to supine: Max assist, +2 for physical assistance General bed mobility comments: Min assist to roll to his side, mod assist to support truk and progress legs over EOB to raise up to sitting.  Pt immediate discomfort due to rectal tube and painful bottom.  Transfers Overall transfer level: Needs assistance Equipment used: 1 person hand held assist Transfers: Sit to/from Stand, Stand Pivot Transfers Sit to Stand: Mod assist, +2 safety/equipment Stand pivot transfers: Mod assist, +2 safety/equipment General transfer comment: Mod assist to stand over weak legs EOB, theapist got under his right arm to help support him better, second person helping to manage lines while pt took pivotal steps around to the recliner chair.  Ambulation/Gait General Gait Details: We discussed today that pt is ready for gait with RW and chair to follow next session.  Wife asking what kind of exercises she can do in the meantime to help.      ADL:    Cognition: Cognition Overall Cognitive Status: Impaired/Different from baseline Arousal/Alertness: Awake/alert Orientation Level: Oriented X4 Attention: Sustained Sustained Attention: Impaired Sustained Attention Impairment: Verbal basic Memory: Impaired Memory Impairment: Storage deficit, Retrieval deficit, Decreased recall of new information Problem Solving: Impaired Problem Solving Impairment: Verbal basic Safety/Judgment: Impaired Cognition Arousal/Alertness: Awake/alert Behavior During Therapy: Flat affect Overall Cognitive Status: Impaired/Different from baseline Area of Impairment: Orientation, Attention Orientation Level:  Disoriented to, Time(knows it is dec, but not specific day or date) Current Attention Level: Sustained Memory: Decreased short-term memory Following Commands: Follows one step commands consistently Safety/Judgement: Decreased awareness of deficits Awareness: Emergent General Comments: Cognition improving  Blood pressure 104/67, pulse 83, temperature 98.4 F (36.9 C), temperature source Oral, resp. rate 16, height 5\' 8"  (1.727 m), weight 63.7 kg, SpO2 97 %. Physical Exam  Constitutional: No distress.  HENT:  Head: Normocephalic.  NGT  Eyes: Pupils are equal, round, and reactive to light.  Neck: Normal range of motion.  Cardiovascular: Normal rate.  Respiratory: Effort normal.  GI: Soft.  Musculoskeletal:        General: No edema.  Neurological:  Patient is alert. Wife at bedside. Follow simple commands. He cannot recall his full hospital stay. UE 5/5 bilaterally. LE: 3/5 HF, 4/5 KE and 4+/5 bilateral ankles. No sensory deficits  Skin: He is not diaphoretic.    Results for orders placed or performed during the hospital encounter of 02/09/18 (from the past 24 hour(s))  Prepare RBC     Status: None   Collection Time: 02/26/18  6:24 AM  Result Value Ref Range   Order Confirmation      ORDER PROCESSED BY BLOOD BANK Performed at [facility], [address] [place], [place] [address]   Glucose, capillary     Status: None   Collection Time: 02/26/18  8:05 AM  Result Value Ref Range   Glucose-Capillary 96 70 - 99 mg/dL  Type and screen     Status: None (Preliminary result)   Collection Time: 02/26/18  8:25 AM  Result Value Ref Range   ABO/RH(D) A POS    Antibody Screen NEG    Sample Expiration 03/01/2018    Unit Number [id number]    Blood Component Type RED CELLS,LR    Unit division 00    Status of Unit  ISSUED    Transfusion Status OK TO TRANSFUSE    Crossmatch Result      Compatible Performed at [facility], [address] [place], [place] [address]     Glucose, capillary     Status: None   Collection Time: 02/26/18 11:53 AM  Result Value Ref Range   Glucose-Capillary 86 70 - 99 mg/dL  Glucose, capillary     Status: None   Collection Time: 02/26/18  4:02 PM  Result Value Ref Range   Glucose-Capillary 94 70 - 99 mg/dL  Hemoglobin and hematocrit, blood     Status: Abnormal   Collection Time: 02/26/18  6:03 PM  Result Value Ref Range   Hemoglobin 7.4 (L) 13.0 - 17.0 g/dL   HCT 19.124.1 (L) 47.839.0 - 29.552.0 %  Glucose, capillary     Status: Abnormal   Collection Time: 02/26/18  7:49 PM  Result Value Ref Range   Glucose-Capillary 100 (H) 70 - 99 mg/dL  Glucose, capillary     Status: None   Collection Time: 02/26/18 11:41 PM  Result Value Ref Range   Glucose-Capillary 95 70 - 99 mg/dL   Dg Abd Portable 1v  Result Date: 02/25/2018 CLINICAL DATA:  Nausea EXAM: PORTABLE ABDOMEN - 1 VIEW COMPARISON:  02/09/2018 FINDINGS: Contrast material is noted throughout colon consistent with the recent modified barium swallow. No obstructive changes are seen. Feeding catheter is noted within the distal stomach. No bony abnormality is seen. IMPRESSION: No acute abnormality noted. Electronically Signed   By: Alcide CleverMark  Lukens M.D.   On: 02/25/2018 20:41     Assessment/Plan: Diagnosis: debility after sepsis, ARDS 1. Does the need for close, 24 hr/day medical supervision in concert with the patient's rehab needs make it unreasonable for this patient to be served in a less intensive setting? Yes 2. Co-Morbidities requiring supervision/potential complications: dysphagia, nutrition, ID considerations 3. Due to bladder management, bowel management, safety, skin/wound care, disease management, medication administration, pain management and patient education, does the patient require 24 hr/day rehab nursing? Yes 4. Does the patient require coordinated care of a physician, rehab nurse, PT (1-2 hrs/day, 5 days/week), OT (1-2 hrs/day, 5 days/week) and potentially SLP to address  physical and functional deficits in the context of the above medical diagnosis(es)? Yes Addressing deficits in the following areas: balance, endurance, locomotion, strength, transferring, bowel/bladder control, bathing, dressing, feeding, grooming, toileting, swallowing and psychosocial support 5. Can the patient actively participate in an intensive therapy program of at least 3 hrs of therapy per day at least 5 days per week? Yes 6. The potential for patient to make measurable gains while on inpatient rehab is excellent 7. Anticipated functional outcomes upon discharge from inpatient rehab are modified independent  with PT, modified independent with OT, modified independent with SLP. 8. Estimated rehab length of stay to reach the above functional goals is: 7-10 days 9. Anticipated D/C setting: Home 10. Anticipated post D/C treatments: HH therapy and Outpatient therapy 11. Overall Rehab/Functional Prognosis: excellent  RECOMMENDATIONS: This patient's condition is appropriate for continued rehabilitative care in the following setting: CIR Patient has agreed to participate in recommended program. Yes Note that insurance prior authorization may be required for reimbursement for recommended care.  Comment: Rehab Admissions Coordinator to follow up.  Thanks,  Ranelle OysterZachary T. Jacque Byron, MD, [place] DomFAAPMR  I have personally performed a face to face diagnostic evaluation of this patient. Additionally, I have reviewed and concur with the physician assistant's documentation above.    Mcarthur Rossettianiel J Angiulli, PA-C  02/27/2018 

## 2018-02-27 NOTE — Progress Notes (Signed)
PROGRESS NOTE    Roberto Knapp  WUJ:[id number] DOB: 1970-07-18 DOA: 02/09/2018 PCP: [name], MD   Brief Narrative:  HPI On 02/09/2018 by Dr. [name] Roberto Knapp is a 47 y.o. male who denies any significant past medical history, now presenting to the emergency department with approximately 10 days of fevers, generalized aches, abdominal pain, nausea, vomiting, diarrhea, cough, and shortness of breath.  Symptoms began with fevers, generalized aches, abdominal discomfort, and nausea with nonbloody vomiting, and diarrhea, but have progressed to include worsening shortness of breath, productive cough, melena, and then bright red blood per rectum.  Patient had been taking Advil every 4 hours for his symptoms, developed melena after few days, but reports more recent bright red blood in his stool.  Abdominal pain is mainly epigastric.  Denies chest pain.  Denies leg swelling or tenderness.  No headache, change in vision or hearing, or focal numbness or weakness.  He has never experienced these symptoms previously.  No recent travel or sick contacts.  Reports drinking 2-3 beers daily, denies illicit drug use, and denies ever experiencing withdrawal symptoms.  Interim history Admitted with sepsis secondary to multifocal pneumonia.  Found to also have acute hypoxic respiratory failure as well as acute kidney injury, gastritis with anemia. Assessment & Plan   Acute hypoxic respiratory failure  -Patient noted to have hypoxia on admission, oxygen saturations at rest 87% -secondary to Legionella pneumonia, ARDS  -Influenza PCR negative -Continue to treat underlying condition -appears to be improving   Septic shock secondary to Legionella pneumonia and ARDS -Patient presented with fever, tachycardia, tachypnea, hypoxia -Initially placed on azithromycin, ceftriaxone followed by Zosyn and vancomycin, eventually transitioned to IV Levaquin and completed course of antibiotics -Patient required IV  pressors and was admitted to the ICU.  Was also placed on IV stress dose steroids while in the ICU which have been discontinued -Blood pressure does appear to be stable -Appears patient had hypoxia that required pronating position  Acute toxic and metabolic encephalopathy -Altered factorial including ICU/hospital induced delirium, uremia, hypoxic injury, medications, sepsis -Speech therapy consulted for cognitive and swallowing evaluation  Acute kidney injury with mixed anion gap and non-anion gap metabolic and respiratory acidosis -Nephrology was consulted and appreciated -Secondary to prerenal etiology, ATN and hemodynamic disturbances -Patient did require CRRT in the ICU -Currently requiring intermittent hemodialysis as per nephrology  Dysphagia -Speech therapy consulted -Patient did have cortrack for nutrition -Barium swallow suggested high aspiration risk and recommended for the patient remain n.p.o.  Normocytic Anemia/GI bleeding -Anemia likely multifactorial due to acute blood loss secondary to GI bleeding, critical illness, sepsis and renal disease -On admission, patient reported epigastric pain with melena after taking Advil every 4 hours for fever and aches. -FOBT positive -Continue PPI twice daily -Patient does not appear to have any active GI bleeding, GI was not consulted -Hemoglobin dropped to 6.8, transfused 1 unit PRBC- hemoglobin now 7.6 today -Continue to monitor  Atrial fibrillation with RVR -Appears paroxysmal, currently patient in sinus rhythm -Was on amiodarone for rate control -Anticoagulation was deferred secondary to GI bleed -Will likely not need anticoagulation moving forward given that patient does not appear to have further episodes of atrial fibrillation  Elevated LFTs -Secondary to sepsis vs medications vs ?alcohol use -Upon review of chart, appears to have trended downward and currently normalized  Bilateral leg pain -CK normal.  Etiology  unclear -Continue symptomatic treatment, pain control  Diarrhea -Suspect secondary to tube feeding and antibiotic use -Currently no  leukocytosis, no abdominal distention nausea or vomiting -Does not appear to be C. difficile, patient was placed on Imodium -Abdominal x-ray showed no acute abnormality  Left upper extremity pain/upper extremity basilic SVT -Mild edema noted -Currently not at risk for further worsening of DVT.  Given bleeding risk anticoagulation held -Treat with warm compresses  Pressure injury -Unstageable, on penis. -Continue wound care -not present on admission  Deconditioning -PT rec CIR -Inpatient rehab consulted  DVT Prophylaxis  SCDs  Code Status: Full  Family Communication: Wife at bedside  Disposition Plan: Admitted. Dispo pending.  Inpatient rehab consulted  Consultants P[facility]M Nephrology Inpatient rehab  Procedures  Intubation extubation, 12/9 -12/18 Left IJ CVL Right IJ HD cath  Antibiotics   Anti-infectives (From admission, onward)   Start     Dose/Rate Route Frequency Ordered Stop   02/19/18 1000  levofloxacin (LEVAQUIN) IVPB 500 mg     500 mg 100 mL/hr over 60 Minutes Intravenous Every 48 hours 02/17/18 1106 02/23/18 1859   02/14/18 0900  Levofloxacin (LEVAQUIN) IVPB 250 mg  Status:  Discontinued     250 mg 50 mL/hr over 60 Minutes Intravenous Every 24 hours 02/13/18 0815 02/17/18 1106   02/13/18 0830  levofloxacin (LEVAQUIN) IVPB 500 mg     500 mg 100 mL/hr over 60 Minutes Intravenous  Once 02/13/18 0815 02/13/18 1150   02/12/18 2000  vancomycin (VANCOCIN) 1,500 mg in sodium chloride 0.9 % 500 mL IVPB  Status:  Discontinued     1,500 mg 250 mL/hr over 120 Minutes Intravenous Every 24 hours 02/11/18 1406 02/11/18 1521   02/12/18 2000  piperacillin-tazobactam (ZOSYN) IVPB 3.375 g  Status:  Discontinued     3.375 g 100 mL/hr over 30 Minutes Intravenous Every 6 hours 02/12/18 1408 02/13/18 0815   02/10/18 2000  vancomycin (VANCOCIN)  1,250 mg in sodium chloride 0.9 % 250 mL IVPB  Status:  Discontinued     1,250 mg 166.7 mL/hr over 90 Minutes Intravenous Every 24 hours 02/09/18 1839 02/10/18 0728   02/10/18 1530  vancomycin (VANCOCIN) IVPB 750 mg/150 ml premix  Status:  Discontinued     750 mg 150 mL/hr over 60 Minutes Intravenous Every 12 hours 02/10/18 1432 02/11/18 1406   02/10/18 1400  piperacillin-tazobactam (ZOSYN) IVPB 3.375 g  Status:  Discontinued     3.375 g 12.5 mL/hr over 240 Minutes Intravenous Every 8 hours 02/10/18 0731 02/12/18 1408   02/10/18 0745  piperacillin-tazobactam (ZOSYN) IVPB 3.375 g     3.375 g 100 mL/hr over 30 Minutes Intravenous  Once 02/10/18 0728 02/10/18 0921   02/10/18 0600  ceFEPIme (MAXIPIME) 2 g in sodium chloride 0.9 % 100 mL IVPB  Status:  Discontinued     2 g 200 mL/hr over 30 Minutes Intravenous Every 12 hours 02/09/18 1839 02/09/18 2014   02/10/18 0200  cefTRIAXone (ROCEPHIN) 1 g in sodium chloride 0.9 % 100 mL IVPB  Status:  Discontinued     1 g 200 mL/hr over 30 Minutes Intravenous Every 24 hours 02/09/18 2014 02/10/18 0728   02/09/18 2015  azithromycin (ZITHROMAX) 500 mg in sodium chloride 0.9 % 250 mL IVPB  Status:  Discontinued     500 mg 250 mL/hr over 60 Minutes Intravenous Every 24 hours 02/09/18 2014 02/12/18 1306   02/09/18 1830  vancomycin (VANCOCIN) 1,250 mg in sodium chloride 0.9 % 250 mL IVPB     1,250 mg 166.7 mL/hr over 90 Minutes Intravenous  Once 02/09/18 1800 02/09/18 2127   02/09/18 1800  ceFEPIme (MAXIPIME) 2 g in sodium chloride 0.9 % 100 mL IVPB     2 g 200 mL/hr over 30 Minutes Intravenous  Once 02/09/18 1745 02/09/18 1925   02/09/18 1800  metroNIDAZOLE (FLAGYL) IVPB 500 mg  Status:  Discontinued     500 mg 100 mL/hr over 60 Minutes Intravenous Every 8 hours 02/09/18 1745 02/10/18 0728   02/09/18 1800  vancomycin (VANCOCIN) IVPB 1000 mg/200 mL premix  Status:  Discontinued     1,000 mg 200 mL/hr over 60 Minutes Intravenous  Once 02/09/18 1745  02/09/18 1800      Subjective:   Roberto Knapp seen and examined today. No complaints. Very sleepy. Per wife, did not rest well overnight.  Objective:   Vitals:   02/26/18 2100 02/26/18 2344 02/27/18 0742 02/27/18 0830  BP:  104/67 111/78   Pulse:  83 73 75  Resp:  16 13 16   Temp:  98.4 F (36.9 C) 98.2 F (36.8 C)   TempSrc:  Oral Oral   SpO2: 98% 97% 98% 98%  Weight:      Height:        Intake/Output Summary (Last 24 hours) at 02/27/2018 1158 Last data filed at 02/27/2018 1157 Gross per 24 hour  Intake 506.67 ml  Output 2300 ml  Net -1793.33 ml   Filed Weights   02/25/18 0630 02/25/18 1130 02/25/18 1502  Weight: 67.5 kg 64.6 kg 63.7 kg   Exam  General: Well developed, thin, NAD  HEENT: NCAT, mucous membranes moist.   Neck: Supple  Cardiovascular: S1 S2 auscultated, RRR, no murmur  Respiratory: Diminished breath sounds  Abdomen: Soft, nontender, nondistended, + bowel sounds  Extremities: warm dry without cyanosis clubbing. Pedal edema-trace  Neuro: AAOx3, sleeping  Data Reviewed: I have personally reviewed following labs and imaging studies  CBC: Recent Labs  Lab 02/22/18 0631 02/23/18 0642 02/25/18 0500 02/26/18 0430 02/26/18 1803 02/27/18 0539  WBC 9.0 10.0 13.0* 11.4*  --  10.5  HGB 7.4* 7.4* 7.2* 6.8* 7.4* 7.6*  HCT 23.4* 23.3* 22.4* 22.1* 24.1* 24.5*  MCV 93.6 94.3 94.1 96.9  --  94.2  PLT 209 230 PLATELET CLUMPS NOTED ON SMEAR, COUNT APPEARS DECREASED 150  --  PLATELET CLUMPS NOTED ON SMEAR, UNABLE TO ESTIMATE   Basic Metabolic Panel: Recent Labs  Lab 02/21/18 0512 02/22/18 0631 02/23/18 0642 02/24/18 0559 02/25/18 0500 02/26/18 0430 02/27/18 0539  NA  --  136 138 137 132* 136 132*  K  --  3.9 4.4 4.4 4.6 4.1 3.8  CL  --  98 99 96* 91* 96* 93*  CO2  --  24 23 27 26 28 28   GLUCOSE  --  116* 107* 105* 102* 97 105*  BUN  --  75* 99* 46* 68* 33* 46*  CREATININE  --  6.30* 8.02* 4.95* 6.72* 4.16* 5.65*  CALCIUM  --  8.0* 8.1* 8.0*  7.8* 8.1* 8.2*  MG 2.1 2.2  --   --   --   --   --   PHOS  --  6.9* 7.9* 5.6* 8.0* 5.9* 5.8*   GFR: Estimated Creatinine Clearance: 14.2 mL/min (A) (by C-G formula based on SCr of 5.65 mg/dL (H)). Liver Function Tests: Recent Labs  Lab 02/22/18 0631 02/23/18 0642 02/24/18 0559 02/25/18 0500 02/26/18 0430 02/27/18 0539  AST 33  --   --   --   --   --   ALT 61*  --   --   --   --   --  ALKPHOS 117  --   --   --   --   --   BILITOT 0.4  --   --   --   --   --   PROT 5.7*  --   --   --   --   --   ALBUMIN 1.6* 1.6* 1.7* 1.7* 1.7* 1.8*   No results for input(s): LIPASE, AMYLASE in the last 168 hours. No results for input(s): AMMONIA in the last 168 hours. Coagulation Profile: No results for input(s): INR, PROTIME in the last 168 hours. Cardiac Enzymes: Recent Labs  Lab 02/21/18 1120  CKTOTAL 38*   BNP (last 3 results) No results for input(s): PROBNP in the last 8760 hours. HbA1C: No results for input(s): HGBA1C in the last 72 hours. CBG: Recent Labs  Lab 02/26/18 1153 02/26/18 1602 02/26/18 1949 02/26/18 2341 02/27/18 0740  GLUCAP 86 94 100* 95 110*   Lipid Profile: No results for input(s): CHOL, HDL, LDLCALC, TRIG, CHOLHDL, LDLDIRECT in the last 72 hours. Thyroid Function Tests: No results for input(s): TSH, T4TOTAL, FREET4, T3FREE, THYROIDAB in the last 72 hours. Anemia Panel: No results for input(s): VITAMINB12, FOLATE, FERRITIN, TIBC, IRON, RETICCTPCT in the last 72 hours. Urine analysis:    Component Value Date/Time   COLORURINE AMBER (A) 02/09/2018 1816   APPEARANCEUR CLEAR 02/09/2018 1816   LABSPEC 1.019 02/09/2018 1816   PHURINE 6.0 02/09/2018 1816   GLUCOSEU NEGATIVE 02/09/2018 1816   HGBUR SMALL (A) 02/09/2018 1816   BILIRUBINUR NEGATIVE 02/09/2018 1816   KETONESUR NEGATIVE 02/09/2018 1816   PROTEINUR 100 (A) 02/09/2018 1816   UROBILINOGEN 1.0 10/21/2014 0951   NITRITE NEGATIVE 02/09/2018 1816   LEUKOCYTESUR NEGATIVE 02/09/2018 1816   Sepsis  Labs: @LABRCNTIP (procalcitonin:4,lacticidven:4)  )No results found for this or any previous visit (from the past 240 hour(s)).    Radiology Studies: Dg Abd Portable 1v  Result Date: 02/25/2018 CLINICAL DATA:  Nausea EXAM: PORTABLE ABDOMEN - 1 VIEW COMPARISON:  02/09/2018 FINDINGS: Contrast material is noted throughout colon consistent with the recent modified barium swallow. No obstructive changes are seen. Feeding catheter is noted within the distal stomach. No bony abnormality is seen. IMPRESSION: No acute abnormality noted. Electronically Signed   By: Alcide Clever M.D.   On: 02/25/2018 20:41     Scheduled Meds: . sodium chloride   Intravenous Once  . bacitracin   Topical BID  . Chlorhexidine Gluconate Cloth  6 each Topical Q0600  . Chlorhexidine Gluconate Cloth  6 each Topical Q0600  . darbepoetin (ARANESP) injection - NON-DIALYSIS  100 mcg Subcutaneous Q Tue-1800  . guaiFENesin  10 mL Per Tube TID  . heparin injection (subcutaneous)  5,000 Units Subcutaneous Q8H  . Influenza vac split quadrivalent PF  0.5 mL Intramuscular Tomorrow-1000  . levalbuterol  0.63 mg Nebulization TID  . pantoprazole sodium  40 mg Per Tube Daily  . sodium chloride flush  10-40 mL Intracatheter Q12H   Continuous Infusions: . sodium chloride Stopped (02/21/18 1536)  . feeding supplement (NEPRO CARB STEADY) 50 mL/hr at 02/27/18 0347     LOS: 18 days   Time Spent in minutes   30 minutes  Florance Paolillo D.O. on 02/27/2018 at 11:58 AM  Between 7am to 7pm - Please see pager noted on [email or web address]  After 7pm go to [email or web address]  And look for the night coverage person covering for me after hours  [facility] Office  [phone]

## 2018-02-27 NOTE — Progress Notes (Signed)
[facility] ROUNDING NOTE   Subjective:   Appears to be stable this morning.  Nondistressed.  Dialysis 02/25/2018 for clearance only.  Continues to make urine  Urine output seems to have improved 1.25 L 02/26/2018  Blood pressure 111/78 pulse 73 temperature 98.2 O2 sats 98% 1 L  Sodium 132 potassium 3.8 chloride 93 CO2 28 BUN 56 creatinine 5.65 glucose 105 calcium 8.2 phosphorus 5.2 albumin 1.8 WBC 10.5 hemoglobin 7.6 platelets clumped  Chest x-ray showed slightly worsening bilateral patchy airspace disease right worse than left multifocal infection thought to be likely 02/22/2018  Abdominal x-ray no acute findings 02/25/2018     Objective:  Vital signs in last 24 hours:  Temp:  [98 F (36.7 C)-98.7 F (37.1 C)] 98.2 F (36.8 C) (12/26 0742) Pulse Rate:  [73-83] 75 (12/26 0830) Resp:  [12-18] 16 (12/26 0830) BP: (104-119)/(67-85) 111/78 (12/26 0742) SpO2:  [97 %-100 %] 98 % (12/26 0830)  Weight change:  Filed Weights   02/25/18 0630 02/25/18 1130 02/25/18 1502  Weight: 67.5 kg 64.6 kg 63.7 kg    Intake/Output: I/O last 3 completed shifts: In: 556.7 [I.V.:20; Blood:308; NG/GT:228.7] Out: 1850 [Urine:1850]   Intake/Output this shift:  No intake/output data recorded. Alert awake and oriented mucosa moist CVS- RRR JVP not elevated RS- CTA diminished air entry no crackles no wheezes ABD- BS present soft non-distended EXT-bilateral trace peripheral edema   Basic Metabolic Panel: Recent Labs  Lab 02/21/18 0512 02/22/18 0631 02/23/18 0642 02/24/18 0559 02/25/18 0500 02/26/18 0430 02/27/18 0539  NA  --  136 138 137 132* 136 132*  K  --  3.9 4.4 4.4 4.6 4.1 3.8  CL  --  98 99 96* 91* 96* 93*  CO2  --  _0 GLUCOSE  --  116* 107* 105* 102* 97 105*  BUN  --  75* 99* 46* 68* 33* 46*  CREATININE  --  6.30* 8.02* 4.95* 6.72* 4.16* 5.65*  CALCIUM  --  8.0* 8.1* 8.0* 7.8* 8.1* 8.2*  MG 2.1 2.2  --   --   --   --   --   PHOS  --  6.9*  7.9* 5.6* 8.0* 5.9* 5.8*    Liver Function Tests: Recent Labs  Lab 02/22/18 0631 02/23/18 0642 02/24/18 0559 02/25/18 0500 02/26/18 0430 02/27/18 0539  AST 33  --   --   --   --   --   ALT 61*  --   --   --   --   --   ALKPHOS 117  --   --   --   --   --   BILITOT 0.4  --   --   --   --   --   PROT 5.7*  --   --   --   --   --   ALBUMIN 1.6* 1.6* 1.7* 1.7* 1.7* 1.8*   No results for input(s): LIPASE, AMYLASE in the last 168 hours. No results for input(s): AMMONIA in the last 168 hours.  CBC: Recent Labs  Lab 02/22/18 0631 02/23/18 0642 02/25/18 0500 02/26/18 0430 02/26/18 1803 02/27/18 0539  WBC 9.0 10.0 13.0* 11.4*  --  10.5  HGB 7.4* 7.4* 7.2* 6.8* 7.4* 7.6*  HCT 23.4* 23.3* 22.4* 22.1* 24.1* 24.5*  MCV 93.6 94.3 94.1 96.9  --  94.2  PLT 209 230 PLATELET CLUMPS NOTED ON SMEAR, COUNT APPEARS DECREASED 150  --  PLATELET CLUMPS NOTED ON SMEAR,  UNABLE TO ESTIMATE    Cardiac Enzymes: Recent Labs  Lab 02/21/18 1120  CKTOTAL 38*    BNP: Invalid input(s): POCBNP  CBG: Recent Labs  Lab 02/26/18 1153 02/26/18 1602 02/26/18 1949 02/26/18 2341 02/27/18 0740  GLUCAP 86 94 100* 95 110*    Microbiology: Results for orders placed or performed during the hospital encounter of 02/09/18  Culture, blood (routine x 2)     Status: None   Collection Time: 02/09/18  5:30 PM  Result Value Ref Range Status   Specimen Description BLOOD LEFT ANTECUBITAL  Final   Special Requests   Final    BOTTLES DRAWN AEROBIC AND ANAEROBIC Blood Culture adequate volume   Culture   Final    NO GROWTH 5 DAYS Performed at [facility], [place] [address] [place], [place] [address]    Report Status 02/14/2018 FINAL  Final  Urine culture     Status: None   Collection Time: 02/09/18  6:16 PM  Result Value Ref Range Status   Specimen Description URINE, RANDOM  Final   Special Requests NONE  Final   Culture   Final    NO GROWTH Performed at [facility], [place] [address]  [place], [place] [address]    Report Status 02/10/2018 FINAL  Final  Culture, blood (routine x 2)     Status: None   Collection Time: 02/09/18  6:16 PM  Result Value Ref Range Status   Specimen Description BLOOD BLOOD RIGHT WRIST  Final   Special Requests   Final    BOTTLES DRAWN AEROBIC AND ANAEROBIC Blood Culture adequate volume   Culture   Final    NO GROWTH 5 DAYS Performed at [facility], [place] [address], [place], [place] [address]    Report Status 02/14/2018 FINAL  Final  Gastrointestinal Panel by PCR , Stool     Status: None   Collection Time: 02/09/18  6:36 PM  Result Value Ref Range Status   Campylobacter species NOT DETECTED NOT DETECTED Final   Plesimonas shigelloides NOT DETECTED NOT DETECTED Final   Salmonella species NOT DETECTED NOT DETECTED Final   Yersinia enterocolitica NOT DETECTED NOT DETECTED Final   Vibrio species NOT DETECTED NOT DETECTED Final   Vibrio cholerae NOT DETECTED NOT DETECTED Final   Enteroaggregative E coli (EAEC) NOT DETECTED NOT DETECTED Final   Enteropathogenic E coli (EPEC) NOT DETECTED NOT DETECTED Final   Enterotoxigenic E coli (ETEC) NOT DETECTED NOT DETECTED Final   Shiga like toxin producing E coli (STEC) NOT DETECTED NOT DETECTED Final   Shigella/Enteroinvasive E coli (EIEC) NOT DETECTED NOT DETECTED Final   Cryptosporidium NOT DETECTED NOT DETECTED Final   Cyclospora cayetanensis NOT DETECTED NOT DETECTED Final   Entamoeba histolytica NOT DETECTED NOT DETECTED Final   Giardia lamblia NOT DETECTED NOT DETECTED Final   Adenovirus F40/41 NOT DETECTED NOT DETECTED Final   Astrovirus NOT DETECTED NOT DETECTED Final   Norovirus GI/GII NOT DETECTED NOT DETECTED Final   Rotavirus A NOT DETECTED NOT DETECTED Final   Sapovirus (I, II, IV, and V) NOT DETECTED NOT DETECTED Final    Comment: Performed at [facility], [place] [place], [place] [address]  C difficile quick scan w PCR reflex     Status: None   Collection Time:  02/09/18  6:36 PM  Result Value Ref Range Status   C Diff antigen NEGATIVE NEGATIVE Final   C Diff toxin NEGATIVE NEGATIVE Final   C  Diff interpretation No C. difficile detected.  Final    Comment: Performed at [facility], [place] [address], [place], [place] [address]  MRSA PCR Screening     Status: None   Collection Time: 02/10/18  1:34 AM  Result Value Ref Range Status   MRSA by PCR NEGATIVE NEGATIVE Final    Comment:        The GeneXpert MRSA Assay (FDA approved for NASAL specimens only), is one component of a comprehensive MRSA colonization surveillance program. It is not intended to diagnose MRSA infection nor to guide or monitor treatment for MRSA infections. Performed at [facility], [place] [address], [place], [place] [address]   Culture, bal-quantitative     Status: None   Collection Time: 02/10/18  1:26 PM  Result Value Ref Range Status   Specimen Description BRONCHIAL ALVEOLAR LAVAGE  Final   Special Requests Normal  Final   Gram Stain   Final    FEW WBC PRESENT,BOTH PMN AND MONONUCLEAR NO ORGANISMS SEEN    Culture   Final    NO GROWTH 2 DAYS Performed at [facility], [place] [address], [place], [place] [address]    Report Status 02/12/2018 FINAL  Final  Pneumocystis smear by DFA     Status: None   Collection Time: 02/10/18  1:26 PM  Result Value Ref Range Status   Specimen Source-PJSRC BRONCHIAL ALVEOLAR LAVAGE  Final   Pneumocystis jiroveci Ag NEGATIVE  Final    Comment: Performed at [facility] Performed at [facility], [address] [place], [place] [address]     Coagulation Studies: No results for input(s): LABPROT, INR in the last 72 hours.  Urinalysis: No results for input(s): COLORURINE, LABSPEC, PHURINE, GLUCOSEU, HGBUR, BILIRUBINUR, KETONESUR, PROTEINUR, UROBILINOGEN, NITRITE, LEUKOCYTESUR in the last 72 hours.  Invalid input(s): APPERANCEUR    Imaging: Dg Abd Portable 1v  Result Date: 02/25/2018 CLINICAL  DATA:  Nausea EXAM: PORTABLE ABDOMEN - 1 VIEW COMPARISON:  02/09/2018 FINDINGS: Contrast material is noted throughout colon consistent with the recent modified barium swallow. No obstructive changes are seen. Feeding catheter is noted within the distal stomach. No bony abnormality is seen. IMPRESSION: No acute abnormality noted. Electronically Signed   By: Inez Catalina M.D.   On: 02/25/2018 20:41     Medications:   . sodium chloride Stopped (02/21/18 1536)  . feeding supplement (NEPRO CARB STEADY) 50 mL/hr at 02/27/18 0347   . sodium chloride   Intravenous Once  . bacitracin   Topical BID  . Chlorhexidine Gluconate Cloth  6 each Topical Q0600  . Chlorhexidine Gluconate Cloth  6 each Topical Q0600  . darbepoetin (ARANESP) injection - NON-DIALYSIS  100 mcg Subcutaneous Q Tue-1800  . guaiFENesin  10 mL Per Tube TID  . heparin injection (subcutaneous)  5,000 Units Subcutaneous Q8H  . Influenza vac split quadrivalent PF  0.5 mL Intramuscular Tomorrow-1000  . levalbuterol  0.63 mg Nebulization TID  . pantoprazole sodium  40 mg Per Tube Daily  . sodium chloride flush  10-40 mL Intracatheter Q12H   sodium chloride, oxyCODONE **AND** acetaminophen, acetaminophen, [DISCONTINUED] ondansetron **OR** ondansetron (ZOFRAN) IV, sodium chloride flush, traZODone  Assessment/ Plan:   Acute kidney injury in setting of ATN recent history of nonsteroidal anti-inflammatory drugs and IV contrast was started on CRRT 02/12/2018 to 02/17/2018 and started on intermittent hemodialysis 02/19/2018, 02/20/2018, 02/23/2018 and 02/25/2018.  Vas-Cath appears to have been in place for 15 days .  Urine  output slow to improve although is a little more.  We will continue to follow creatinine hopefully if creatinine starts to improve we can remove his Vas-Cath.  However creatinine has increased very slightly today we will continue to follow serial labs no dialysis planned.  Last dialysis treatment 02/25/2018  Hypertension/volume  appears to be stable at this point will continue to follow appears euvolemic  Anemia with darbepoetin 100 mcg 02/18/2018    Acute hypoxic respiratory failure secondary to Legionella ARDS.  Mated with septic shock.  Levaquin has been discontinued  Toxic encephalopathy appears to have improved  Atrial fibrillation with rapid ventricular rate anticoagulation deferred.  No further episodes of atrial fibrillation noted patient now in sinus rhythm  Diarrhea with rectal tube does not appear to be secondary to C. Difficile.  Appears to be improved   electrolytes appear to be stable  Acid-base status stable.    LOS: [place] _0 _1 :01 AM

## 2018-02-27 NOTE — Progress Notes (Signed)
Nutrition Follow-up  DOCUMENTATION CODES:   Not applicable  INTERVENTION:    Continue Nepro Carb Steady formula at goal rate of 50 ml/hr  Provides 2160 kcal, 97 gm protein, 872 ml free water daily  NUTRITION DIAGNOSIS:   Inadequate oral intake now related to dysphagia as evidenced by NPO status, ongoing  GOAL:   Patient will meet greater than or equal to 90% of their needs, met  MONITOR:   Diet advancement, TF tolerance, Labs, Skin, Weight trends  ASSESSMENT:   47 yo male with PMH of drug overdose, DM, and renal insufficiency who was admitted on 12/8 with CAD, gastritis (from NSAIDS) and progressive respiratory failure.   12/18 extubated 12/20 Cortrak tube placed  Pt in his wheelchair upon visit today; family members with him. Nepro formula currently infusing at 50 ml/hr via 10 F Cortrak tube. S/p dysphagia treatment this AM. SLP rec continued NPO status.  Nephrology continues to follow pt for iHD.  His last HD treatment was 12/24. Labs & medications reviewed. CBG's 95-110-107.  Diet Order:   Diet Order            Diet NPO time specified  Diet effective now             EDUCATION NEEDS:   No education needs have been identified at this time  Skin:  Skin Assessment: Skin Integrity Issues: Skin Integrity Issues:: DTI DTI: penis  Last BM:  12/25  Height:   Ht Readings from Last 1 Encounters:  02/10/18 '5\' 8"'$  (1.727 m)   Weight:   Wt Readings from Last 1 Encounters:  02/27/18 66.8 kg   Ideal Body Weight:  70 kg  BMI:  Body mass index is 22.39 kg/m.  Estimated Nutritional Needs:   Kcal:  2000-2200  Protein:  90-110 gm  Fluid:  1 L + UOP  Arthur Holms, RD, LDN Pager #: [phone] After-Hours Pager #: [phone]

## 2018-02-27 NOTE — Progress Notes (Signed)
Physical Therapy Treatment Patient Details Name: Roberto Knapp MRN: [id number] DOB: [birth year] Today's Date: 02/27/2018    History of Present Illness Pt is a 47 y.o. M with no significant PMH who was admitted with fever, aches, SOB, abdominal pain, N/V, BRBPR who was found to have CAP, legionella infection, and progressive hypoxic failure. Developed severe ARDS requiring paralysis and proning. Intubated 12/9-12/18    PT Comments    Patient progressing well with therapy today, able to ambulate short distances with support. Balance and strength deficits noted, cognition improving. Agree with CIR recs, patient very motivated to regain PLOF and work again. Pt and wife delighted with progress today.   SpO2 98% on RA HR 88 BP 126/83     Follow Up Recommendations  CIR     Equipment Recommendations  (TBD )    Recommendations for Other Services Rehab consult     Precautions / Restrictions Precautions Precautions: Fall Restrictions Weight Bearing Restrictions: No    Mobility  Bed Mobility Overal bed mobility: Needs Assistance Bed Mobility: Supine to Sit Rolling: Min guard Sidelying to sit: Min guard          Transfers Overall transfer level: Needs assistance Equipment used: 1 person hand held assist Transfers: Sit to/from UGI CorporationStand;Stand Pivot Transfers Sit to Stand: Min assist Stand pivot transfers: Min assist       General transfer comment: min A to stand, several LOB back onto bed. able to side step along bed.   Ambulation/Gait Ambulation/Gait assistance: Min assist Gait Distance (Feet): 20 Feet Assistive device: None Gait Pattern/deviations: Step-to pattern;Step-through pattern Gait velocity: decreased   General Gait Details: Pt with unsteadiness, close chair follow for safety, intermittent use of BUE support on objects to maintain balance. min A for stability.    Stairs             Wheelchair Mobility    Modified Rankin (Stroke Patients Only)        Balance Overall balance assessment: Needs assistance Sitting-balance support: Feet supported;Bilateral upper extremity supported Sitting balance-Leahy Scale: Fair     Standing balance support: Single extremity supported Standing balance-Leahy Scale: Poor                              Cognition Arousal/Alertness: Awake/alert Behavior During Therapy: WFL for tasks assessed/performed                                   General Comments: Cognition improving      Exercises      General Comments        Pertinent Vitals/Pain Pain Assessment: Faces Faces Pain Scale: No hurt    Home Living                      Prior Function            PT Goals (current goals can now be found in the care plan section) Acute Rehab PT Goals Patient Stated Goal: to get stronger for his wife, to eat PT Goal Formulation: With patient Time For Goal Achievement: 03/08/18 Potential to Achieve Goals: Good Progress towards PT goals: Progressing toward goals    Frequency    Min 3X/week      PT Plan Discharge plan needs to be updated    Co-evaluation  AM-PAC PT "6 Clicks" Mobility   Outcome Measure  Help needed turning from your back to your side while in a flat bed without using bedrails?: A Little Help needed moving from lying on your back to sitting on the side of a flat bed without using bedrails?: A Little Help needed moving to and from a bed to a chair (including a wheelchair)?: A Lot Help needed standing up from a chair using your arms (e.g., wheelchair or bedside chair)?: A Lot Help needed to walk in hospital room?: A Lot Help needed climbing 3-5 steps with a railing? : A Lot 6 Click Score: 14    End of Session Equipment Utilized During Treatment: Gait belt Activity Tolerance: Patient limited by pain Patient left: in chair;with call bell/phone within reach;with family/visitor present Nurse Communication: Mobility  status PT Visit Diagnosis: Muscle weakness (generalized) (M62.81);Other abnormalities of gait and mobility (R26.89);Pain     Time: 1700-1730 PT Time Calculation (min) (ACUTE ONLY): 30 min  Charges:  $Gait Training: 8-22 mins $Therapeutic Activity: 8-22 mins                     Etta GrandchildSean Acire Tang, PT, DPT Acute Rehabilitation Services Pager: [phone] Office: [phone]     Etta GrandchildSean Jamaiyah Pyle 02/27/2018, 5:33 PM

## 2018-02-27 NOTE — Progress Notes (Signed)
Inpatient Rehabilitation-Admissions Coordinator    Met with patient and his wife at the bedside to discuss team's recommendation for inpatient rehabilitation. Shared booklets, expectations while in CIR, expected length of stay, and anticipated functional level at DC. Pt wanting to pursue CIR at this time.  Pt's wife mentioned that the pt's insurance plan will term on 03/04/18 and that he will then be placed on her insurance; [facility] advised pt's wife to bring in new card to admitting.  AC did discuss current insurance benefits through pt's BC[employer] for this year.   Pt is currently requiring intermittent dialysis. AC will await medical readiness.  Please call if questions.   Jhonnie Garner, OTR/L  Rehab Admissions Coordinator  [phone] 02/27/2018 4:27 PM

## 2018-02-27 NOTE — Progress Notes (Signed)
Tube feed was restarted overnight Patient tolerated rate with no complaints.   Patient wants to wait until morning to do daily weight, CHG bath and heparin SQ .

## 2018-02-27 NOTE — Progress Notes (Signed)
  Speech Language Pathology Treatment: Dysphagia  Patient Details Name: Roberto Knapp MRN: [id number] DOB: [birth year] Today's Date: 02/27/2018 Time:  -     Assessment / Plan / Recommendation Clinical Impression  Pt consumed honey thick liquids by spoon without overt signs of aspiration. Mod cues were provided for completion of effortful swallows and CTAR, with cueing primarily for sustained attention. Pt's volitional cough subjectively appears to be stronger, and he believes his overall physical strength is improving. He and his wife believe that his vocal quality is nearing baseline. Recommend proceeding with repeat MBS on next date. Instructed pt to perform pharyngeal exercises today in anticipation of testing tomorrow.   HPI HPI: 47 yo male admitted 02/09/18 w/ CAP, legionella infection, probable gastritis (from NSAIDS) and progressive hypoxic resp failure. Developed severe ARDS requiring ETT 12/9-12/18, paralysis, and proning. PMH: intentional overdose. CXR = Diffuse right lung airspace disease and left lower lobe airspace opacity, worsened since last CXR      SLP Plan  MBS       Recommendations  Diet recommendations: NPO Medication Administration: Via alternative means                Oral Care Recommendations: Oral care QID Follow up Recommendations: Skilled Nursing facility;LTACH SLP Visit Diagnosis: Dysphagia, oropharyngeal phase (R13.12) Plan: MBS       GO                Roberto Knapp, Roberto Knapp 02/27/2018, 11:28 AM  Roberto Knapp, M.A. CCC-SLP Acute Herbalistehabilitation Services Pager [phone] Office [phone]

## 2018-02-28 ENCOUNTER — Inpatient Hospital Stay (HOSPITAL_COMMUNITY): Payer: BLUE CROSS/BLUE SHIELD

## 2018-02-28 LAB — CBC
HCT: 23.7 % — ABNORMAL LOW (ref 39.0–52.0)
Hemoglobin: 7.6 g/dL — ABNORMAL LOW (ref 13.0–17.0)
MCH: 29.6 pg (ref 26.0–34.0)
MCHC: 32.1 g/dL (ref 30.0–36.0)
MCV: 92.2 fL (ref 80.0–100.0)
Platelets: UNDETERMINED 10*3/uL (ref 150–400)
RBC: 2.57 MIL/uL — ABNORMAL LOW (ref 4.22–5.81)
RDW: 13.9 % (ref 11.5–15.5)
WBC: 10.1 10*3/uL (ref 4.0–10.5)
nRBC: 0 % (ref 0.0–0.2)

## 2018-02-28 LAB — RENAL FUNCTION PANEL
Albumin: 1.9 g/dL — ABNORMAL LOW (ref 3.5–5.0)
Anion gap: 13 (ref 5–15)
BUN: 61 mg/dL — ABNORMAL HIGH (ref 6–20)
CO2: 26 mmol/L (ref 22–32)
Calcium: 8.4 mg/dL — ABNORMAL LOW (ref 8.9–10.3)
Chloride: 93 mmol/L — ABNORMAL LOW (ref 98–111)
Creatinine, Ser: 6.3 mg/dL — ABNORMAL HIGH (ref 0.61–1.24)
GFR calc Af Amer: 11 mL/min — ABNORMAL LOW (ref >60)
GFR calc non Af Amer: 10 mL/min — ABNORMAL LOW (ref >60)
Glucose, Bld: 99 mg/dL (ref 70–99)
Phosphorus: 5.6 mg/dL — ABNORMAL HIGH (ref 2.5–4.6)
Potassium: 3.8 mmol/L (ref 3.5–5.1)
Sodium: 132 mmol/L — ABNORMAL LOW (ref 135–145)

## 2018-02-28 LAB — GLUCOSE, CAPILLARY
Glucose-Capillary: 103 mg/dL — ABNORMAL HIGH (ref 70–99)
Glucose-Capillary: 116 mg/dL — ABNORMAL HIGH (ref 70–99)

## 2018-02-28 MED ORDER — CHLORHEXIDINE GLUCONATE CLOTH 2 % EX PADS
6.0000 | MEDICATED_PAD | Freq: Every day | CUTANEOUS | Status: DC
  Administered 2018-03-01 – 2018-03-04 (×4): 6 via TOPICAL

## 2018-02-28 NOTE — Progress Notes (Signed)
PROGRESS NOTE    Roberto Knapp  MWU:[id number] DOB: April 20, 1970 DOA: 02/09/2018 PCP: [name], MD   Brief Narrative:  HPI On 02/09/2018 by Dr. [name] Roberto Knapp is a 47 y.o. male who denies any significant past medical history, now presenting to the emergency department with approximately 10 days of fevers, generalized aches, abdominal pain, nausea, vomiting, diarrhea, cough, and shortness of breath.  Symptoms began with fevers, generalized aches, abdominal discomfort, and nausea with nonbloody vomiting, and diarrhea, but have progressed to include worsening shortness of breath, productive cough, melena, and then bright red blood per rectum.  Patient had been taking Advil every 4 hours for his symptoms, developed melena after few days, but reports more recent bright red blood in his stool.  Abdominal pain is mainly epigastric.  Denies chest pain.  Denies leg swelling or tenderness.  No headache, change in vision or hearing, or focal numbness or weakness.  He has never experienced these symptoms previously.  No recent travel or sick contacts.  Reports drinking 2-3 beers daily, denies illicit drug use, and denies ever experiencing withdrawal symptoms.  Interim history Admitted with sepsis secondary to multifocal pneumonia.  Found to also have acute hypoxic respiratory failure as well as acute kidney injury, gastritis with anemia. Also developed AKI and required CRRT, nephrology following.  Assessment & Plan   Acute hypoxic respiratory failure  -Patient noted to have hypoxia on admission, oxygen saturations at rest 87% -secondary to Legionella pneumonia, ARDS  -Influenza PCR negative -Continue to treat underlying condition -appears to be improving   Septic shock secondary to Legionella pneumonia and ARDS -Patient presented with fever, tachycardia, tachypnea, hypoxia -Initially placed on azithromycin, ceftriaxone followed by Zosyn and vancomycin, eventually transitioned to IV Levaquin and  completed course of antibiotics -Patient required IV pressors and was admitted to the ICU.  Was also placed on IV stress dose steroids while in the ICU which have been discontinued -Blood pressure does appear to be stable -Appears patient had hypoxia that required pronating position  Acute toxic and metabolic encephalopathy -Altered factorial including ICU/hospital induced delirium, uremia, hypoxic injury, medications, sepsis -Speech therapy consulted for cognitive and swallowing evaluation -appears to be improved, AAOx3  Acute kidney injury with mixed anion gap and non-anion gap metabolic and respiratory acidosis -Nephrology was consulted and appreciated -Secondary to prerenal etiology, ATN and hemodynamic disturbances -Patient did require CRRT in the ICU -Currently requiring intermittent hemodialysis as per nephrology -creatinine worsening, up to 6.3 today -pending further nephrology recommendations  Dysphagia -Speech therapy consulted- planning for MBS -Patient did have cortrack for nutrition -Barium swallow suggested high aspiration risk and recommended for the patient remain n.p.o.  Normocytic Anemia/GI bleeding -Anemia likely multifactorial due to acute blood loss secondary to GI bleeding, critical illness, sepsis and renal disease -On admission, patient reported epigastric pain with melena after taking Advil every 4 hours for fever and aches. -FOBT positive -Continue PPI twice daily -Patient does not appear to have any active GI bleeding, GI was not consulted -Hemoglobin dropped to 6.8, transfused 1 unit PRBC -hemoglobin stable, 7.6 -Continue to monitor CBC  Atrial fibrillation with RVR -Appears paroxysmal, currently patient in sinus rhythm -Was on amiodarone for rate control -Anticoagulation was deferred secondary to GI bleed -Will likely not need anticoagulation moving forward given that patient does not appear to have further episodes of atrial fibrillation  Elevated  LFTs -Secondary to sepsis vs medications vs ?alcohol use -Upon review of chart, appears to have trended downward and currently  normalized  Bilateral leg pain -CK normal.  Etiology unclear -Continue symptomatic treatment, pain control  Diarrhea -Suspect secondary to tube feeding and antibiotic use -Currently no leukocytosis, no abdominal distention nausea or vomiting -Does not appear to be C. difficile, patient was placed on Imodium -Abdominal x-ray showed no acute abnormality  Left upper extremity pain/upper extremity basilic SVT -Mild edema noted -Currently not at risk for further worsening of DVT.  Given bleeding risk anticoagulation held -Treat with warm compresses  Pressure injury -Unstageable, on penis. -Continue wound care -not present on admission  Deconditioning -PT rec CIR -pending OR consult -Inpatient rehab consulted- patient potential candidate  DVT Prophylaxis  SCDs  Code Status: Full  Family Communication: Wife at bedside  Disposition Plan: Admitted. Dispo pending- patient wanting to go home.  Inpatient rehab consulted. Discussed with patient and wife regarding need for rehab prior to returning home.  Consultants PCCM Nephrology Inpatient rehab  Procedures  Intubation extubation, 12/9 -12/18 Left IJ CVL Right IJ HD cath  Antibiotics   Anti-infectives (From admission, onward)   Start     Dose/Rate Route Frequency Ordered Stop   02/19/18 1000  levofloxacin (LEVAQUIN) IVPB 500 mg     500 mg 100 mL/hr over 60 Minutes Intravenous Every 48 hours 02/17/18 1106 02/23/18 1859   02/14/18 0900  Levofloxacin (LEVAQUIN) IVPB 250 mg  Status:  Discontinued     250 mg 50 mL/hr over 60 Minutes Intravenous Every 24 hours 02/13/18 0815 02/17/18 1106   02/13/18 0830  levofloxacin (LEVAQUIN) IVPB 500 mg     500 mg 100 mL/hr over 60 Minutes Intravenous  Once 02/13/18 0815 02/13/18 1150   02/12/18 2000  vancomycin (VANCOCIN) 1,500 mg in sodium chloride 0.9 % 500 mL  IVPB  Status:  Discontinued     1,500 mg 250 mL/hr over 120 Minutes Intravenous Every 24 hours 02/11/18 1406 02/11/18 1521   02/12/18 2000  piperacillin-tazobactam (ZOSYN) IVPB 3.375 g  Status:  Discontinued     3.375 g 100 mL/hr over 30 Minutes Intravenous Every 6 hours 02/12/18 1408 02/13/18 0815   02/10/18 2000  vancomycin (VANCOCIN) 1,250 mg in sodium chloride 0.9 % 250 mL IVPB  Status:  Discontinued     1,250 mg 166.7 mL/hr over 90 Minutes Intravenous Every 24 hours 02/09/18 1839 02/10/18 0728   02/10/18 1530  vancomycin (VANCOCIN) IVPB 750 mg/150 ml premix  Status:  Discontinued     750 mg 150 mL/hr over 60 Minutes Intravenous Every 12 hours 02/10/18 1432 02/11/18 1406   02/10/18 1400  piperacillin-tazobactam (ZOSYN) IVPB 3.375 g  Status:  Discontinued     3.375 g 12.5 mL/hr over 240 Minutes Intravenous Every 8 hours 02/10/18 0731 02/12/18 1408   02/10/18 0745  piperacillin-tazobactam (ZOSYN) IVPB 3.375 g     3.375 g 100 mL/hr over 30 Minutes Intravenous  Once 02/10/18 0728 02/10/18 0921   02/10/18 0600  ceFEPIme (MAXIPIME) 2 g in sodium chloride 0.9 % 100 mL IVPB  Status:  Discontinued     2 g 200 mL/hr over 30 Minutes Intravenous Every 12 hours 02/09/18 1839 02/09/18 2014   02/10/18 0200  cefTRIAXone (ROCEPHIN) 1 g in sodium chloride 0.9 % 100 mL IVPB  Status:  Discontinued     1 g 200 mL/hr over 30 Minutes Intravenous Every 24 hours 02/09/18 2014 02/10/18 0728   02/09/18 2015  azithromycin (ZITHROMAX) 500 mg in sodium chloride 0.9 % 250 mL IVPB  Status:  Discontinued     500 mg 250  mL/hr over 60 Minutes Intravenous Every 24 hours 02/09/18 2014 02/12/18 1306   02/09/18 1830  vancomycin (VANCOCIN) 1,250 mg in sodium chloride 0.9 % 250 mL IVPB     1,250 mg 166.7 mL/hr over 90 Minutes Intravenous  Once 02/09/18 1800 02/09/18 2127   02/09/18 1800  ceFEPIme (MAXIPIME) 2 g in sodium chloride 0.9 % 100 mL IVPB     2 g 200 mL/hr over 30 Minutes Intravenous  Once 02/09/18 1745 02/09/18  1925   02/09/18 1800  metroNIDAZOLE (FLAGYL) IVPB 500 mg  Status:  Discontinued     500 mg 100 mL/hr over 60 Minutes Intravenous Every 8 hours 02/09/18 1745 02/10/18 0728   02/09/18 1800  vancomycin (VANCOCIN) IVPB 1000 mg/200 mL premix  Status:  Discontinued     1,000 mg 200 mL/hr over 60 Minutes Intravenous  Once 02/09/18 1745 02/09/18 1800      Subjective:   Phineas RealGary Bartram seen and examined today.  Complains about being in the hospital, would like to go home.  Denies current chest pain, shortness of breath, abdominal pain, nausea or vomiting, diarrhea or constipation, dizziness or headache.  Objective:   Vitals:   02/27/18 1912 02/27/18 2311 02/28/18 0650 02/28/18 0855  BP:  118/88    Pulse:  83    Resp:  16    Temp:  98 F (36.7 C)    TempSrc:  Oral    SpO2: 95% 98%  97%  Weight:   62.6 kg   Height:        Intake/Output Summary (Last 24 hours) at 02/28/2018 1422 Last data filed at 02/28/2018 0758 Gross per 24 hour  Intake 1298.33 ml  Output 1050 ml  Net 248.33 ml   Filed Weights   02/25/18 1502 02/27/18 0500 02/28/18 0650  Weight: 63.7 kg 66.8 kg 62.6 kg   Exam  General: Well developed, thin, NAD  HEENT: NCAT, mucous membranes moist.   Neck: Supple  Cardiovascular: S1 S2 auscultated, RRR, no murmur  Respiratory: Clear to auscultation bilaterally with equal chest rise  Abdomen: Soft, nontender, nondistended, + bowel sounds  Extremities: warm dry without cyanosis clubbing or edema  Neuro: AAOx3, nonfocal  Psych: Normal affect and demeanor, pleasant   Data Reviewed: I have personally reviewed following labs and imaging studies  CBC: Recent Labs  Lab 02/23/18 0642 02/25/18 0500 02/26/18 0430 02/26/18 1803 02/27/18 0539 02/28/18 0644  WBC 10.0 13.0* 11.4*  --  10.5 10.1  HGB 7.4* 7.2* 6.8* 7.4* 7.6* 7.6*  HCT 23.3* 22.4* 22.1* 24.1* 24.5* 23.7*  MCV 94.3 94.1 96.9  --  94.2 92.2  PLT 230 PLATELET CLUMPS NOTED ON SMEAR, COUNT APPEARS DECREASED 150   --  PLATELET CLUMPS NOTED ON SMEAR, UNABLE TO ESTIMATE PLATELET CLUMPS NOTED ON SMEAR, UNABLE TO ESTIMATE   Basic Metabolic Panel: Recent Labs  Lab 02/22/18 0631  02/24/18 0559 02/25/18 0500 02/26/18 0430 02/27/18 0539 02/28/18 0644  NA 136   < > 137 132* 136 132* 132*  K 3.9   < > 4.4 4.6 4.1 3.8 3.8  CL 98   < > 96* 91* 96* 93* 93*  CO2 24   < > 27 26 28 28 26   GLUCOSE 116*   < > 105* 102* 97 105* 99  BUN 75*   < > 46* 68* 33* 46* 61*  CREATININE 6.30*   < > 4.95* 6.72* 4.16* 5.65* 6.30*  CALCIUM 8.0*   < > 8.0* 7.8* 8.1* 8.2* 8.4*  MG 2.2  --   --   --   --   --   --  PHOS 6.9*   < > 5.6* 8.0* 5.9* 5.8* 5.6*   < > = values in this interval not displayed.   GFR: Estimated Creatinine Clearance: 12.6 mL/min (A) (by C-G formula based on SCr of 6.3 mg/dL (H)). Liver Function Tests: Recent Labs  Lab 02/22/18 0631  02/24/18 0559 02/25/18 0500 02/26/18 0430 02/27/18 0539 02/28/18 0644  AST 33  --   --   --   --   --   --   ALT 61*  --   --   --   --   --   --   ALKPHOS 117  --   --   --   --   --   --   BILITOT 0.4  --   --   --   --   --   --   PROT 5.7*  --   --   --   --   --   --   ALBUMIN 1.6*   < > 1.7* 1.7* 1.7* 1.8* 1.9*   < > = values in this interval not displayed.   No results for input(s): LIPASE, AMYLASE in the last 168 hours. No results for input(s): AMMONIA in the last 168 hours. Coagulation Profile: No results for input(s): INR, PROTIME in the last 168 hours. Cardiac Enzymes: No results for input(s): CKTOTAL, CKMB, CKMBINDEX, TROPONINI in the last 168 hours. BNP (last 3 results) No results for input(s): PROBNP in the last 8760 hours. HbA1C: No results for input(s): HGBA1C in the last 72 hours. CBG: Recent Labs  Lab 02/27/18 1610 02/27/18 1947 02/27/18 2317 02/28/18 0742 02/28/18 1252  GLUCAP 104* 101* 95 103* 116*   Lipid Profile: No results for input(s): CHOL, HDL, LDLCALC, TRIG, CHOLHDL, LDLDIRECT in the last 72 hours. Thyroid Function  Tests: No results for input(s): TSH, T4TOTAL, FREET4, T3FREE, THYROIDAB in the last 72 hours. Anemia Panel: No results for input(s): VITAMINB12, FOLATE, FERRITIN, TIBC, IRON, RETICCTPCT in the last 72 hours. Urine analysis:    Component Value Date/Time   COLORURINE AMBER (A) 02/09/2018 1816   APPEARANCEUR CLEAR 02/09/2018 1816   LABSPEC 1.019 02/09/2018 1816   PHURINE 6.0 02/09/2018 1816   GLUCOSEU NEGATIVE 02/09/2018 1816   HGBUR SMALL (A) 02/09/2018 1816   BILIRUBINUR NEGATIVE 02/09/2018 1816   KETONESUR NEGATIVE 02/09/2018 1816   PROTEINUR 100 (A) 02/09/2018 1816   UROBILINOGEN 1.0 10/21/2014 0951   NITRITE NEGATIVE 02/09/2018 1816   LEUKOCYTESUR NEGATIVE 02/09/2018 1816   Sepsis Labs: @LABRCNTIP (procalcitonin:4,lacticidven:4)  )No results found for this or any previous visit (from the past 240 hour(s)).    Radiology Studies: No results found.   Scheduled Meds: . bacitracin   Topical BID  . darbepoetin (ARANESP) injection - NON-DIALYSIS  100 mcg Subcutaneous Q Tue-1800  . guaiFENesin  10 mL Per Tube TID  . heparin injection (subcutaneous)  5,000 Units Subcutaneous Q8H  . Influenza vac split quadrivalent PF  0.5 mL Intramuscular Tomorrow-1000  . levalbuterol  0.63 mg Nebulization TID  . pantoprazole sodium  40 mg Per Tube Daily  . sodium chloride flush  10-40 mL Intracatheter Q12H   Continuous Infusions: . sodium chloride Stopped (02/21/18 1536)  . feeding supplement (NEPRO CARB STEADY) 50 mL/hr at 02/27/18 0347     LOS: 19 days   Time Spent in minutes   30 minutes  Azyria Osmon D.O. on 02/28/2018 at 2:22 PM  Between 7am to 7pm - Please see pager noted on [email or web address]  After 7pm go to [email or web address]  And look for the night coverage person covering for me after hours  [facility] Office  [phone]

## 2018-02-28 NOTE — Progress Notes (Signed)
Physical Therapy Treatment Patient Details Name: Roberto Knapp MRN: [id number] DOB: 11-Sep-1970 Today's Date: 02/28/2018    History of Present Illness Pt is a 47 y.o. M with no significant PMH who was admitted with fever, aches, SOB, abdominal pain, N/V, BRBPR who was found to have CAP, legionella infection, and progressive hypoxic failure. Developed severe ARDS requiring paralysis and proning. Intubated 12/9-12/18    PT Comments    Patient continuing to demonstrate progress, increasing ambulation distance now on RA without desatting. Pt with several LOB during gait training today requiring mod A to stabilize. Overall patient improving well and will benefit greatly from CIR once medically ready for d/c.   Follow Up Recommendations  CIR      Equipment Recommendations  TBD   Recommendations for Other Services Rehab consult     Precautions / Restrictions Precautions Precautions: Fall    Mobility  Bed Mobility Overal bed mobility: Needs Assistance Bed Mobility: Supine to Sit Rolling: Supervision            Transfers Overall transfer level: Needs assistance Equipment used: 1 person hand held assist Transfers: Sit to/from Stand;Stand Pivot Transfers Sit to Stand: Min guard Stand pivot transfers: Min guard          Ambulation/Gait Ambulation/Gait assistance: Min assist;Mod assist Gait Distance (Feet): 75 Feet Assistive device: None Gait Pattern/deviations: Step-to pattern;Step-through pattern Gait velocity: decreased   General Gait Details: Patient ambulating with short step length and unsteady gait. severe LOB posterior requring mod A to correct x5 during session, provoked easily  by conversation/dual tasking. cues for BOS and stride length, min guard to Min A for stability throughout.     Stairs             Wheelchair Mobility    Modified Rankin (Stroke Patients Only)       Balance Overall balance assessment: Needs assistance Sitting-balance  support: Feet supported;Bilateral upper extremity supported Sitting balance-Leahy Scale: Fair Sitting balance - Comments: Able to progress to supervision-min guard assist   Standing balance support: Single extremity supported Standing balance-Leahy Scale: Poor Standing balance comment: mod A to correct posture                            Cognition Arousal/Alertness: Awake/alert Behavior During Therapy: WFL for tasks assessed/performed                                   General Comments: Cognition improving but balance suffers with duel tasking.       Exercises      General Comments        Pertinent Vitals/Pain Pain Assessment: Faces Faces Pain Scale: No hurt    Home Living                      Prior Function            PT Goals (current goals can now be found in the care plan section) Acute Rehab PT Goals Patient Stated Goal: to get stronger for his wife, to eat PT Goal Formulation: With patient Time For Goal Achievement: 03/08/18 Potential to Achieve Goals: Good Progress towards PT goals: Progressing toward goals    Frequency    Min 3X/week      PT Plan Discharge plan needs to be updated    Co-evaluation  AM-PAC PT "6 Clicks" Mobility   Outcome Measure  Help needed turning from your back to your side while in a flat bed without using bedrails?: None Help needed moving from lying on your back to sitting on the side of a flat bed without using bedrails?: A Little Help needed moving to and from a bed to a chair (including a wheelchair)?: A Little Help needed standing up from a chair using your arms (e.g., wheelchair or bedside chair)?: A Little Help needed to walk in hospital room?: A Lot Help needed climbing 3-5 steps with a railing? : A Lot 6 Click Score: 17    End of Session Equipment Utilized During Treatment: Gait belt Activity Tolerance: Patient tolerated treatment well Patient left: in  chair;with call bell/phone within reach;with family/visitor present Nurse Communication: Mobility status PT Visit Diagnosis: Muscle weakness (generalized) (M62.81);Other abnormalities of gait and mobility (R26.89);Pain     Time: 1505-1530 PT Time Calculation (min) (ACUTE ONLY): 25 min  Charges:  $Gait Training: 8-22 mins $Therapeutic Activity: 8-22 mins                    Roberto Knapp, PT, DPT Acute Rehabilitation Services Pager: [phone] Office: [phone]     Roberto Knapp 02/28/2018, 3:35 PM

## 2018-02-28 NOTE — Progress Notes (Signed)
Modified Barium Swallow Progress Note  Patient Details  Name: Nolon BussingGary W Witucki MRN: [id number] Date of Birth: May 23, 1970  Today's Date: 02/28/2018  Modified Barium Swallow completed.  Full report located under Chart Review in the Imaging Section.  Brief recommendations include the following:  Clinical Impression  Pt has improved strength and timing for swallow, with more oropharyngeal clearance and airway protection. He is better able to self-feed and has more oral containment before the swallow. He has mild lingual residue with solids that he clears spontaneously. In trying to clear the barium tablet, he had one instance of premature spillage with thin liquids via straw that was aspirated silently. When drinking thin liquids in isolation, he had occasionaly, trace penetration of thin liquids via straw that cleared easily with a cued throat clear. Recommend starting Dys 3 diet and thin liquids by cup. Education was provided about silent nature of aspiration and the importance of avoiding mixed consistencies for the time being. SLP will continue to follow for tolerance and readiness to advance.   Swallow Evaluation Recommendations       SLP Diet Recommendations: Dysphagia 3 (Mech soft) solids;Thin liquid   Liquid Administration via: Cup;No straw   Medication Administration: Whole meds with puree   Supervision: Patient able to self feed;Intermittent supervision to cue for compensatory strategies   Compensations: Slow rate;Small sips/bites   Postural Changes: Seated upright at 90 degrees   Oral Care Recommendations: Oral care BID        Maxcine Hamaiewonsky, Tandre Conly 02/28/2018,2:40 PM   Maxcine HamLaura Paiewonsky, M.A. CCC-SLP Acute Herbalistehabilitation Services Pager [phone] Office [phone]

## 2018-02-28 NOTE — Progress Notes (Signed)
[facility] ROUNDING NOTE   Subjective:   Last dialysis 12/24.  Patient had 1.5 L UOP over 12/26.  Spoke with primary team - they are recommending short term placement for rehab.  He is discouraged to hear about needing additional HD treatments but does agree to dialysis tomorrow.  We discussed plans for access with IR on 12/30 for ash-split/tunneled line.  Spoke with wife at bedside  Review of systems:  Denies shortness of breath or cough  Denies chest pain Denies nausea or vomiting    Objective:  Vital signs in last 24 hours:  Temp:  [98 F (36.7 C)-98.5 F (36.9 C)] 98 F (36.7 C) (12/26 2311) Pulse Rate:  [80-83] 83 (12/26 2311) Resp:  [12-16] 16 (12/26 2311) BP: (114-118)/(82-88) 118/88 (12/26 2311) SpO2:  [95 %-98 %] 98 % (12/27 1427) Weight:  [62.6 kg] 62.6 kg (12/27 0650)  Weight change: -4.2 kg Filed Weights   02/25/18 1502 02/27/18 0500 02/28/18 0650  Weight: 63.7 kg 66.8 kg 62.6 kg    Intake/Output: I/O last 3 completed shifts: In: 198.7 [I.V.:20; NG/GT:178.7] Out: 2300 [Urine:2300]   Intake/Output this shift:  Total I/O In: 1298.3 [NG/GT:1298.3] Out: -   General adult male in bed in no acute distress HEENT normocephalic atraumatic extraocular movements intact sclera anicteric Neck supple trachea midline Lungs clear to auscultation bilaterally normal work of breathing at rest  Heart regular rate and rhythm no rubs or gallops appreciated Abdomen soft nontender nondistended Extremities no edema  Psych normal mood and affect    Basic Metabolic Panel: Recent Labs  Lab 02/22/18 0631  02/24/18 0559 02/25/18 0500 02/26/18 0430 02/27/18 0539 02/28/18 0644  NA 136   < > 137 132* 136 132* 132*  K 3.9   < > 4.4 4.6 4.1 3.8 3.8  CL 98   < > 96* 91* 96* 93* 93*  CO2 24   < > 27 26 28 28 26   GLUCOSE 116*   < > 105* 102* 97 105* 99  BUN 75*   < > 46* 68* 33* 46* 61*  CREATININE 6.30*   < > 4.95* 6.72* 4.16* 5.65* 6.30*  CALCIUM 8.0*   < >  8.0* 7.8* 8.1* 8.2* 8.4*  MG 2.2  --   --   --   --   --   --   PHOS 6.9*   < > 5.6* 8.0* 5.9* 5.8* 5.6*   < > = values in this interval not displayed.    Liver Function Tests: Recent Labs  Lab 02/22/18 0631  02/24/18 0559 02/25/18 0500 02/26/18 0430 02/27/18 0539 02/28/18 0644  AST 33  --   --   --   --   --   --   ALT 61*  --   --   --   --   --   --   ALKPHOS 117  --   --   --   --   --   --   BILITOT 0.4  --   --   --   --   --   --   PROT 5.7*  --   --   --   --   --   --   ALBUMIN 1.6*   < > 1.7* 1.7* 1.7* 1.8* 1.9*   < > = values in this interval not displayed.    CBC: Recent Labs  Lab 02/23/18 0642 02/25/18 0500 02/26/18 0430 02/26/18 1803 02/27/18 0539 02/28/18 0644  WBC 10.0  13.0* 11.4*  --  10.5 10.1  HGB 7.4* 7.2* 6.8* 7.4* 7.6* 7.6*  HCT 23.3* 22.4* 22.1* 24.1* 24.5* 23.7*  MCV 94.3 94.1 96.9  --  94.2 92.2  PLT 230 PLATELET CLUMPS NOTED ON SMEAR, COUNT APPEARS DECREASED 150  --  PLATELET CLUMPS NOTED ON SMEAR, UNABLE TO ESTIMATE PLATELET CLUMPS NOTED ON SMEAR, UNABLE TO ESTIMATE    Medications:   . sodium chloride Stopped (02/21/18 1536)  . feeding supplement (NEPRO CARB STEADY) 50 mL/hr at 02/27/18 0347   . bacitracin   Topical BID  . darbepoetin (ARANESP) injection - NON-DIALYSIS  100 mcg Subcutaneous Q Tue-1800  . guaiFENesin  10 mL Per Tube TID  . heparin injection (subcutaneous)  5,000 Units Subcutaneous Q8H  . Influenza vac split quadrivalent PF  0.5 mL Intramuscular Tomorrow-1000  . levalbuterol  0.63 mg Nebulization TID  . pantoprazole sodium  40 mg Per Tube Daily  . sodium chloride flush  10-40 mL Intracatheter Q12H   sodium chloride, oxyCODONE **AND** acetaminophen, acetaminophen, [DISCONTINUED] ondansetron **OR** ondansetron (ZOFRAN) IV, sodium chloride flush, traZODone  Assessment/ Plan:   Acute kidney injury in setting of ATN recent history of nonsteroidal anti-inflammatory drugs and IV contrast was started on CRRT 02/12/2018 to  02/17/2018 and started on intermittent hemodialysis 02/19/2018, 02/20/2018, 02/23/2018 and 02/25/2018.  Vas-Cath is currently in place - Plan for HD on 12/28.  Will need an ash split catheter/permcath on 12/30 with IR as he continues to require HD.  Renal will order.  Keep vascath for now  Hypertension/volume - BP acceptable   Anemia on darbepoetin.  Stable.  Should he decline further would transfuse PRBC's with HD  Acute hypoxic respiratory failure secondary to Legionella ARDS.  Mated with septic shock.  Levaquin has been discontinued  Toxic encephalopathy appears to have improved  Atrial fibrillation with rapid ventricular rate anticoagulation deferred.  Sinus rhythm  Diarrhea improved     LOS: 19 Estanislado EmmsLori C Foster 02/28/2018  3:57 PM

## 2018-02-28 NOTE — Plan of Care (Signed)
  Problem: Activity: Goal: Ability to tolerate increased activity will improve Outcome: Progressing   Problem: Clinical Measurements: Goal: Ability to maintain a body temperature in the normal range will improve Outcome: Progressing   Problem: Respiratory: Goal: Ability to maintain adequate ventilation will improve Outcome: Progressing   Problem: Bowel/Gastric: Goal: Will show no signs and symptoms of gastrointestinal bleeding Outcome: Progressing   Problem: Education: Goal: Knowledge of General Education information will improve Description Including pain rating scale, medication(s)/side effects and non-pharmacologic comfort measures Outcome: Progressing   Problem: Activity: Goal: Risk for activity intolerance will decrease Outcome: Progressing   Problem: Activity: Goal: Ability to tolerate increased activity will improve Outcome: Progressing

## 2018-03-01 LAB — CBC
HCT: 24.3 % — ABNORMAL LOW (ref 39.0–52.0)
Hemoglobin: 7.7 g/dL — ABNORMAL LOW (ref 13.0–17.0)
MCH: 29.3 pg (ref 26.0–34.0)
MCHC: 31.7 g/dL (ref 30.0–36.0)
MCV: 92.4 fL (ref 80.0–100.0)
Platelets: 175 10*3/uL (ref 150–400)
RBC: 2.63 MIL/uL — ABNORMAL LOW (ref 4.22–5.81)
RDW: 13.9 % (ref 11.5–15.5)
WBC: 8.6 10*3/uL (ref 4.0–10.5)
nRBC: 0 % (ref 0.0–0.2)

## 2018-03-01 LAB — RENAL FUNCTION PANEL
Albumin: 2.1 g/dL — ABNORMAL LOW (ref 3.5–5.0)
Anion gap: 14 (ref 5–15)
BUN: 63 mg/dL — ABNORMAL HIGH (ref 6–20)
CO2: 24 mmol/L (ref 22–32)
Calcium: 8.4 mg/dL — ABNORMAL LOW (ref 8.9–10.3)
Chloride: 95 mmol/L — ABNORMAL LOW (ref 98–111)
Creatinine, Ser: 6.16 mg/dL — ABNORMAL HIGH (ref 0.61–1.24)
GFR calc Af Amer: 11 mL/min — ABNORMAL LOW (ref >60)
GFR calc non Af Amer: 10 mL/min — ABNORMAL LOW (ref >60)
Glucose, Bld: 97 mg/dL (ref 70–99)
Phosphorus: 5.7 mg/dL — ABNORMAL HIGH (ref 2.5–4.6)
Potassium: 3.7 mmol/L (ref 3.5–5.1)
Sodium: 133 mmol/L — ABNORMAL LOW (ref 135–145)

## 2018-03-01 LAB — GLUCOSE, CAPILLARY: Glucose-Capillary: 88 mg/dL (ref 70–99)

## 2018-03-01 MED ORDER — PANTOPRAZOLE SODIUM 40 MG PO TBEC
40.0000 mg | DELAYED_RELEASE_TABLET | Freq: Every day | ORAL | Status: DC
  Administered 2018-03-01 – 2018-03-06 (×6): 40 mg via ORAL
  Filled 2018-03-01 (×6): qty 1

## 2018-03-01 MED ORDER — HEPARIN SODIUM (PORCINE) 1000 UNIT/ML IJ SOLN
INTRAMUSCULAR | Status: AC
  Administered 2018-03-01: 2400 [IU] via ARTERIOVENOUS_FISTULA
  Filled 2018-03-01: qty 4

## 2018-03-01 MED ORDER — LEVALBUTEROL HCL 0.63 MG/3ML IN NEBU: 0.6300 mg | INHALATION_SOLUTION | Freq: Three times a day (TID) | RESPIRATORY_TRACT | Status: DC | PRN

## 2018-03-01 NOTE — Progress Notes (Signed)
[facility] ROUNDING NOTE   Subjective:   Last dialysis 12/24.  Seen on HD today and tolerating well - 113/86 and HR 88 with blood flow acceptable.  Making urine and continues with foley.  Had 1.1 liters UOP charted 12/27.  Review of systems:  Denies shortness of breath or cough  Denies chest pain Denies nausea or vomiting    Objective:  Vital signs in last 24 hours:  Temp:  [97.7 F (36.5 C)-98.2 F (36.8 C)] 97.7 F (36.5 C) (12/28 1255) Pulse Rate:  [51-85] 51 (12/28 1330) Resp:  [12-18] 18 (12/28 1255) BP: (110-134)/(71-85) 110/71 (12/28 1330) SpO2:  [95 %-100 %] 98 % (12/28 1255) FiO2 (%):  [100 %] 100 % (12/27 2042) Weight:  [61 kg] 61 kg (12/28 1255)  Weight change:  Filed Weights   02/27/18 0500 02/28/18 0650 03/01/18 1255  Weight: 66.8 kg 62.6 kg 61 kg    Intake/Output: I/O last 3 completed shifts: In: 1712.5 [NG/GT:1712.5] Out: 1800 [Urine:1800]   Intake/Output this shift:  No intake/output data recorded.  General adult male in bed in no acute distress HEENT normocephalic atraumatic extraocular movements intact sclera anicteric Neck supple trachea midline Lungs clear to auscultation bilaterally normal work of breathing at rest  Heart regular rate and rhythm no rubs or gallops appreciated Abdomen soft nontender nondistended Extremities no lower extremity edema  Psych normal mood and affect Access - right IJ vascath in place     Basic Metabolic Panel: Recent Labs  Lab 02/25/18 0500 02/26/18 0430 02/27/18 0539 02/28/18 0644 03/01/18 0758  NA 132* 136 132* 132* 133*  K 4.6 4.1 3.8 3.8 3.7  CL 91* 96* 93* 93* 95*  CO2 26 28 28 26 24   GLUCOSE 102* 97 105* 99 97  BUN 68* 33* 46* 61* 63*  CREATININE 6.72* 4.16* 5.65* 6.30* 6.16*  CALCIUM 7.8* 8.1* 8.2* 8.4* 8.4*  PHOS 8.0* 5.9* 5.8* 5.6* 5.7*    Liver Function Tests: Recent Labs  Lab 02/25/18 0500 02/26/18 0430 02/27/18 0539 02/28/18 0644 03/01/18 0758  ALBUMIN 1.7* 1.7*  1.8* 1.9* 2.1*    CBC: Recent Labs  Lab 02/25/18 0500 02/26/18 0430 02/26/18 1803 02/27/18 0539 02/28/18 0644 03/01/18 0758  WBC 13.0* 11.4*  --  10.5 10.1 8.6  HGB 7.2* 6.8* 7.4* 7.6* 7.6* 7.7*  HCT 22.4* 22.1* 24.1* 24.5* 23.7* 24.3*  MCV 94.1 96.9  --  94.2 92.2 92.4  PLT PLATELET CLUMPS NOTED ON SMEAR, COUNT APPEARS DECREASED 150  --  PLATELET CLUMPS NOTED ON SMEAR, UNABLE TO ESTIMATE PLATELET CLUMPS NOTED ON SMEAR, UNABLE TO ESTIMATE 175    Medications:   . sodium chloride Stopped (02/21/18 1536)  . feeding supplement (NEPRO CARB STEADY) Stopped (02/28/18 1545)   . bacitracin   Topical BID  . Chlorhexidine Gluconate Cloth  6 each Topical Q0600  . darbepoetin (ARANESP) injection - NON-DIALYSIS  100 mcg Subcutaneous Q Tue-1800  . guaiFENesin  10 mL Per Tube TID  . heparin injection (subcutaneous)  5,000 Units Subcutaneous Q8H  . Influenza vac split quadrivalent PF  0.5 mL Intramuscular Tomorrow-1000  . pantoprazole  40 mg Oral Daily  . sodium chloride flush  10-40 mL Intracatheter Q12H   sodium chloride, oxyCODONE **AND** acetaminophen, acetaminophen, levalbuterol, [DISCONTINUED] ondansetron **OR** ondansetron (ZOFRAN) IV, sodium chloride flush, traZODone  Assessment/ Plan:   Acute kidney injury in setting of ATN recent history of nonsteroidal anti-inflammatory drugs and IV contrast was started on CRRT 02/12/2018 to 02/17/2018 and started on intermittent hemodialysis  02/19/2018, 02/20/2018, 02/23/2018 and 02/25/2018.  Vas-Cath is currently in place - HD today.  Barring improvement in renal function, given continued dialysis dependence will need an ash split catheter/permcath on 12/30 with IR as he continues to require HD.  Renal will order  - Will need to be NPO after midnight on 12/29 for same.  Hypertension/volume - BP acceptable   Anemia on darbepoetin.  Stable.  Should he decline further would transfuse PRBC's with HD  Acute hypoxic respiratory failure secondary to  Legionella ARDS.  Mated with septic shock.  S/p Levaquin   Encephalopathy improved.  Conversant   Atrial fibrillation with rapid ventricular rate anticoagulation deferred.  Now in sinus rhythm  Diarrhea improved     LOS: 20 Roberto Knapp 03/01/2018  2:07 PM

## 2018-03-01 NOTE — Progress Notes (Signed)
Physical Therapy Treatment Patient Details Name: Roberto Knapp MRN: [id number] DOB: 24-Apr-1970 Today's Date: 03/01/2018    History of Present Illness Pt is a 47 y.o. M with no significant PMH who was admitted with fever, aches, SOB, abdominal pain, N/V, BRBPR who was found to have CAP, legionella infection, and progressive hypoxic failure. Developed severe ARDS requiring paralysis and proning. Intubated 12/9-12/18    PT Comments    Patient with improved dynamic balance this session, less instances of LOB during gait than prior PT visit, at times still mod A to correct.  Worked on balance exercises bedside. Patient progressing well. Cont to rec CIR at this time.    Follow Up Recommendations  CIR     Equipment Recommendations  Rolling walker with 5" wheels;3in1 (PT)    Recommendations for Other Services Rehab consult     Precautions / Restrictions Precautions Precautions: Fall Restrictions Weight Bearing Restrictions: No    Mobility  Bed Mobility Overal bed mobility: Modified Independent                Transfers Overall transfer level: Needs assistance Equipment used: None Transfers: Sit to/from Stand;Stand Pivot Transfers Sit to Stand: Min guard            Ambulation/Gait Ambulation/Gait assistance: Mod assist;Min assist Gait Distance (Feet): 80 Feet Assistive device: None Gait Pattern/deviations: Step-to pattern;Step-through pattern Gait velocity: decreased   General Gait Details: pt ambulating hallway distances, improving dynamic balance noted with less LOB than prior visit. at times, mod A to stabilize.    Stairs             Wheelchair Mobility    Modified Rankin (Stroke Patients Only)       Balance Overall balance assessment: Needs assistance Sitting-balance support: Feet supported;Bilateral upper extremity supported Sitting balance-Leahy Scale: Fair       Standing balance-Leahy Scale: Fair Standing balance comment: mod A to  correct posture                            Cognition Arousal/Alertness: Awake/alert Behavior During Therapy: WFL for tasks assessed/performed Overall Cognitive Status: Impaired/Different from baseline                                 General Comments: congition cont to improve, approprioate and conversational       Exercises Other Exercises Other Exercises: tandem, romberg EO EC 30x5 each for balance    General Comments        Pertinent Vitals/Pain Faces Pain Scale: No hurt    Home Living                      Prior Function            PT Goals (current goals can now be found in the care plan section) Acute Rehab PT Goals Patient Stated Goal: to get stronger for his wife, to eat PT Goal Formulation: With patient Time For Goal Achievement: 03/08/18 Potential to Achieve Goals: Good Progress towards PT goals: Progressing toward goals    Frequency    Min 3X/week      PT Plan Discharge plan needs to be updated    Co-evaluation              AM-PAC PT "6 Clicks" Mobility   Outcome Measure  Help needed turning from your back to your side  while in a flat bed without using bedrails?: None Help needed moving from lying on your back to sitting on the side of a flat bed without using bedrails?: A Little Help needed moving to and from a bed to a chair (including a wheelchair)?: A Little Help needed standing up from a chair using your arms (e.g., wheelchair or bedside chair)?: A Little Help needed to walk in hospital room?: A Lot Help needed climbing 3-5 steps with a railing? : A Lot 6 Click Score: 17    End of Session Equipment Utilized During Treatment: Gait belt Activity Tolerance: Patient tolerated treatment well Patient left: in chair;with call bell/phone within reach;with family/visitor present Nurse Communication: Mobility status PT Visit Diagnosis: Muscle weakness (generalized) (M62.81);Other abnormalities of gait and  mobility (R26.89);Pain     Time: [id number] PT Time Calculation (min) (ACUTE ONLY): 26 min  Charges:  $Gait Training: 8-22 mins $Therapeutic Activity: 8-22 mins                     Etta GrandchildSean Keiondra Brookover, PT, DPT Acute Rehabilitation Services Pager: [phone] Office: [phone]     Etta GrandchildSean Jefferson Fullam 03/01/2018, 10:34 AM

## 2018-03-01 NOTE — Progress Notes (Signed)
PROGRESS NOTE    Roberto BussingGary W Pineo  WUJ:[id number] DOB: 08-05-1970 DOA: 02/09/2018 PCP: Deatra JamesSun, Vyvyan, MD   Brief Narrative:  HPI On 02/09/2018 by Dr. Odie Seraimothy Opyd Roberto Knapp is a 47 y.o. male who denies any significant past medical history, now presenting to the emergency department with approximately 10 days of fevers, generalized aches, abdominal pain, nausea, vomiting, diarrhea, cough, and shortness of breath.  Symptoms began with fevers, generalized aches, abdominal discomfort, and nausea with nonbloody vomiting, and diarrhea, but have progressed to include worsening shortness of breath, productive cough, melena, and then bright red blood per rectum.  Patient had been taking Advil every 4 hours for his symptoms, developed melena after few days, but reports more recent bright red blood in his stool.  Abdominal pain is mainly epigastric.  Denies chest pain.  Denies leg swelling or tenderness.  No headache, change in vision or hearing, or focal numbness or weakness.  He has never experienced these symptoms previously.  No recent travel or sick contacts.  Reports drinking 2-3 beers daily, denies illicit drug use, and denies ever experiencing withdrawal symptoms.  Interim history Admitted with sepsis secondary to multifocal pneumonia.  Found to also have acute hypoxic respiratory failure as well as acute kidney injury, gastritis with anemia. Also developed AKI and required CRRT, nephrology following. Plan for HD today.   Assessment & Plan   Acute hypoxic respiratory failure  -Patient noted to have hypoxia on admission, oxygen saturations at rest 87% -secondary to Legionella pneumonia, ARDS  -Influenza PCR negative -Continue to treat underlying condition -appears to be improving - currently on room air  Septic shock secondary to Legionella pneumonia and ARDS -Patient presented with fever, tachycardia, tachypnea, hypoxia -Initially placed on azithromycin, ceftriaxone followed by Zosyn and vancomycin,  eventually transitioned to IV Levaquin and completed course of antibiotics -Patient required IV pressors and was admitted to the ICU.  Was also placed on IV stress dose steroids while in the ICU which have been discontinued -Blood pressure does appear to be stable -Appears patient had hypoxia that required pronating position  Acute toxic and metabolic encephalopathy -Altered factorial including ICU/hospital induced delirium, uremia, hypoxic injury, medications, sepsis -Speech therapy consulted for cognitive and swallowing evaluation -Resolved, appears to be AAOx3  Acute kidney injury with mixed anion gap and non-anion gap metabolic and respiratory acidosis -Nephrology was consulted and appreciated -Secondary to prerenal etiology, ATN and hemodynamic disturbances -Patient did require CRRT in the ICU -Currently requiring intermittent hemodialysis as per nephrology -creatinine worsening, up to 6.16 today -Nephrology planning for HD today -Continue to monitor BMP  Dysphagia -Speech therapy consulted- s/p MBS, placed on dysphagia 3 diet -Cortrack removed  Normocytic Anemia/GI bleeding -Anemia likely multifactorial due to acute blood loss secondary to GI bleeding, critical illness, sepsis and renal disease -On admission, patient reported epigastric pain with melena after taking Advil every 4 hours for fever and aches. -FOBT positive -Continue PPI twice daily -Patient does not appear to have any active GI bleeding, GI was not consulted -Hemoglobin dropped to 6.8, transfused 1 unit PRBC -hemoglobin stable, 7.7 -Continue to monitor CBC  Atrial fibrillation with RVR -Appears paroxysmal, currently patient in sinus rhythm -Was on amiodarone for rate control -Anticoagulation was deferred secondary to GI bleed -Will likely not need anticoagulation moving forward given that patient does not appear to have further episodes of atrial fibrillation  Elevated LFTs -Secondary to sepsis vs  medications vs ?alcohol use -Upon review of chart, appears to have trended downward and  currently normalized  Bilateral leg pain -CK normal.  Etiology unclear -Continue symptomatic treatment, pain control  Diarrhea -Suspect secondary to tube feeding and antibiotic use -Currently no leukocytosis, no abdominal distention nausea or vomiting -Does not appear to be C. difficile, patient was placed on Imodium -Abdominal x-ray showed no acute abnormality  Left upper extremity pain/upper extremity basilic SVT -Mild edema noted -Currently not at risk for further worsening of DVT.  Given bleeding risk anticoagulation held -Treat with warm compresses  Pressure injury -Unstageable, on penis. -Continue wound care -not present on admission  Deconditioning -PT rec CIR -pending OR consult -Inpatient rehab consulted- patient potential candidate  DVT Prophylaxis  SCDs  Code Status: Full  Family Communication: Wife at bedside  Disposition Plan: Admitted. Dispo TBD. Patient wanting to go home.  Inpatient rehab consulted- pending.   Consultants PCCM Nephrology Inpatient rehab  Procedures  Intubation extubation, 12/9 -12/18 Left IJ CVL Right IJ HD cath  Antibiotics   Anti-infectives (From admission, onward)   Start     Dose/Rate Route Frequency Ordered Stop   02/19/18 1000  levofloxacin (LEVAQUIN) IVPB 500 mg     500 mg 100 mL/hr over 60 Minutes Intravenous Every 48 hours 02/17/18 1106 02/23/18 1859   02/14/18 0900  Levofloxacin (LEVAQUIN) IVPB 250 mg  Status:  Discontinued     250 mg 50 mL/hr over 60 Minutes Intravenous Every 24 hours 02/13/18 0815 02/17/18 1106   02/13/18 0830  levofloxacin (LEVAQUIN) IVPB 500 mg     500 mg 100 mL/hr over 60 Minutes Intravenous  Once 02/13/18 0815 02/13/18 1150   02/12/18 2000  vancomycin (VANCOCIN) 1,500 mg in sodium chloride 0.9 % 500 mL IVPB  Status:  Discontinued     1,500 mg 250 mL/hr over 120 Minutes Intravenous Every 24 hours 02/11/18  1406 02/11/18 1521   02/12/18 2000  piperacillin-tazobactam (ZOSYN) IVPB 3.375 g  Status:  Discontinued     3.375 g 100 mL/hr over 30 Minutes Intravenous Every 6 hours 02/12/18 1408 02/13/18 0815   02/10/18 2000  vancomycin (VANCOCIN) 1,250 mg in sodium chloride 0.9 % 250 mL IVPB  Status:  Discontinued     1,250 mg 166.7 mL/hr over 90 Minutes Intravenous Every 24 hours 02/09/18 1839 02/10/18 0728   02/10/18 1530  vancomycin (VANCOCIN) IVPB 750 mg/150 ml premix  Status:  Discontinued     750 mg 150 mL/hr over 60 Minutes Intravenous Every 12 hours 02/10/18 1432 02/11/18 1406   02/10/18 1400  piperacillin-tazobactam (ZOSYN) IVPB 3.375 g  Status:  Discontinued     3.375 g 12.5 mL/hr over 240 Minutes Intravenous Every 8 hours 02/10/18 0731 02/12/18 1408   02/10/18 0745  piperacillin-tazobactam (ZOSYN) IVPB 3.375 g     3.375 g 100 mL/hr over 30 Minutes Intravenous  Once 02/10/18 0728 02/10/18 0921   02/10/18 0600  ceFEPIme (MAXIPIME) 2 g in sodium chloride 0.9 % 100 mL IVPB  Status:  Discontinued     2 g 200 mL/hr over 30 Minutes Intravenous Every 12 hours 02/09/18 1839 02/09/18 2014   02/10/18 0200  cefTRIAXone (ROCEPHIN) 1 g in sodium chloride 0.9 % 100 mL IVPB  Status:  Discontinued     1 g 200 mL/hr over 30 Minutes Intravenous Every 24 hours 02/09/18 2014 02/10/18 0728   02/09/18 2015  azithromycin (ZITHROMAX) 500 mg in sodium chloride 0.9 % 250 mL IVPB  Status:  Discontinued     500 mg 250 mL/hr over 60 Minutes Intravenous Every 24 hours 02/09/18 2014  02/12/18 1306   02/09/18 1830  vancomycin (VANCOCIN) 1,250 mg in sodium chloride 0.9 % 250 mL IVPB     1,250 mg 166.7 mL/hr over 90 Minutes Intravenous  Once 02/09/18 1800 02/09/18 2127   02/09/18 1800  ceFEPIme (MAXIPIME) 2 g in sodium chloride 0.9 % 100 mL IVPB     2 g 200 mL/hr over 30 Minutes Intravenous  Once 02/09/18 1745 02/09/18 1925   02/09/18 1800  metroNIDAZOLE (FLAGYL) IVPB 500 mg  Status:  Discontinued     500 mg 100 mL/hr  over 60 Minutes Intravenous Every 8 hours 02/09/18 1745 02/10/18 0728   02/09/18 1800  vancomycin (VANCOCIN) IVPB 1000 mg/200 mL premix  Status:  Discontinued     1,000 mg 200 mL/hr over 60 Minutes Intravenous  Once 02/09/18 1745 02/09/18 1800      Subjective:   Roberto Knapp seen and examined today.  States he is feeling better today.  Happy to have the core track tube removed and that he is eating and drinking.  Denies current chest pain, shortness breath, abdominal pain, nausea or vomiting, diarrhea or constipation, dizziness or headache.  Objective:   Vitals:   02/28/18 1707 02/28/18 2308 03/01/18 0742 03/01/18 0746  BP: 134/76 128/75 120/74   Pulse: 83 84 70 85  Resp:  16 12 16   Temp:  98.2 F (36.8 C) 98.2 F (36.8 C)   TempSrc:  Oral Oral   SpO2: 99% 97% 100% 95%  Weight:      Height:        Intake/Output Summary (Last 24 hours) at 03/01/2018 1154 Last data filed at 02/28/2018 2300 Gross per 24 hour  Intake 414.17 ml  Output 1100 ml  Net -685.83 ml   Filed Weights   02/25/18 1502 02/27/18 0500 02/28/18 0650  Weight: 63.7 kg 66.8 kg 62.6 kg   Exam  General: Well developed, thin, NAD  HEENT: NCAT, mucous membranes moist.   Neck: Supple  Cardiovascular: S1 S2 auscultated, RRR, no murmur  Respiratory: Clear to auscultation bilaterally with equal chest rise  Abdomen: Soft, nontender, nondistended, + bowel sounds  Extremities: warm dry without cyanosis clubbing or edema  Neuro: AAOx3, nonfocal  Psych:Pleasant, Appropriate mood and affect  Data Reviewed: I have personally reviewed following labs and imaging studies  CBC: Recent Labs  Lab 02/25/18 0500 02/26/18 0430 02/26/18 1803 02/27/18 0539 02/28/18 0644 03/01/18 0758  WBC 13.0* 11.4*  --  10.5 10.1 8.6  HGB 7.2* 6.8* 7.4* 7.6* 7.6* 7.7*  HCT 22.4* 22.1* 24.1* 24.5* 23.7* 24.3*  MCV 94.1 96.9  --  94.2 92.2 92.4  PLT PLATELET CLUMPS NOTED ON SMEAR, COUNT APPEARS DECREASED 150  --  PLATELET  CLUMPS NOTED ON SMEAR, UNABLE TO ESTIMATE PLATELET CLUMPS NOTED ON SMEAR, UNABLE TO ESTIMATE 175   Basic Metabolic Panel: Recent Labs  Lab 02/25/18 0500 02/26/18 0430 02/27/18 0539 02/28/18 0644 03/01/18 0758  NA 132* 136 132* 132* 133*  K 4.6 4.1 3.8 3.8 3.7  CL 91* 96* 93* 93* 95*  CO2 26 28 28 26 24   GLUCOSE 102* 97 105* 99 97  BUN 68* 33* 46* 61* 63*  CREATININE 6.72* 4.16* 5.65* 6.30* 6.16*  CALCIUM 7.8* 8.1* 8.2* 8.4* 8.4*  PHOS 8.0* 5.9* 5.8* 5.6* 5.7*   GFR: Estimated Creatinine Clearance: 12.8 mL/min (A) (by C-G formula based on SCr of 6.16 mg/dL (H)). Liver Function Tests: Recent Labs  Lab 02/25/18 0500 02/26/18 0430 02/27/18 0539 02/28/18 0644 03/01/18 0758  ALBUMIN 1.7*  1.7* 1.8* 1.9* 2.1*   No results for input(s): LIPASE, AMYLASE in the last 168 hours. No results for input(s): AMMONIA in the last 168 hours. Coagulation Profile: No results for input(s): INR, PROTIME in the last 168 hours. Cardiac Enzymes: No results for input(s): CKTOTAL, CKMB, CKMBINDEX, TROPONINI in the last 168 hours. BNP (last 3 results) No results for input(s): PROBNP in the last 8760 hours. HbA1C: No results for input(s): HGBA1C in the last 72 hours. CBG: Recent Labs  Lab 02/27/18 1947 02/27/18 2317 02/28/18 0742 02/28/18 1252 03/01/18 0743  GLUCAP 101* 95 103* 116* 88   Lipid Profile: No results for input(s): CHOL, HDL, LDLCALC, TRIG, CHOLHDL, LDLDIRECT in the last 72 hours. Thyroid Function Tests: No results for input(s): TSH, T4TOTAL, FREET4, T3FREE, THYROIDAB in the last 72 hours. Anemia Panel: No results for input(s): VITAMINB12, FOLATE, FERRITIN, TIBC, IRON, RETICCTPCT in the last 72 hours. Urine analysis:    Component Value Date/Time   COLORURINE AMBER (A) 02/09/2018 1816   APPEARANCEUR CLEAR 02/09/2018 1816   LABSPEC 1.019 02/09/2018 1816   PHURINE 6.0 02/09/2018 1816   GLUCOSEU NEGATIVE 02/09/2018 1816   HGBUR SMALL (A) 02/09/2018 1816   BILIRUBINUR  NEGATIVE 02/09/2018 1816   KETONESUR NEGATIVE 02/09/2018 1816   PROTEINUR 100 (A) 02/09/2018 1816   UROBILINOGEN 1.0 10/21/2014 0951   NITRITE NEGATIVE 02/09/2018 1816   LEUKOCYTESUR NEGATIVE 02/09/2018 1816   Sepsis Labs: @LABRCNTIP (procalcitonin:4,lacticidven:4)  )No results found for this or any previous visit (from the past 240 hour(s)).    Radiology Studies: Dg Swallowing Func-speech Pathology  Result Date: 02/28/2018 Objective Swallowing Evaluation: Type of Study: MBS-Modified Barium Swallow Study  Patient Details Name: Roberto BussingGary W Krigbaum MRN: [id number] Date of Birth: 27-Oct-1970 Today's Date: 02/28/2018 Time: SLP Start Time (ACUTE ONLY): 1341 -SLP Stop Time (ACUTE ONLY): 1359 SLP Time Calculation (min) (ACUTE ONLY): 18 min Past Medical History: Past Medical History: Diagnosis Date . Drug overdose, intentional [facility](HCC)  Past Surgical History: Past Surgical History: Procedure Laterality Date . FASCIOTOMY  09/08/2011  Procedure: FASCIOTOMY;  Surgeon: Sherren Kernsharles E Fields, MD;  [facility];  Service: Vascular;  Laterality: Left; . FEMORAL-POPLITEAL BYPASS GRAFT  09/08/2011  Procedure: BYPASS GRAFT FEMORAL-POPLITEAL ARTERY;  Surgeon: Sherren Kernsharles E Fields, MD;  [facility];  Service: Vascular;  Laterality: Left; . NO PAST SURGERIES   HPI: 47 yo male admitted 02/09/18 w/ CAP, legionella infection, probable gastritis (from NSAIDS) and progressive hypoxic resp failure. Developed severe ARDS requiring ETT 12/9-12/18, paralysis, and proning. PMH: intentional overdose. CXR = Diffuse right lung airspace disease and left lower lobe airspace opacity, worsened since last CXR  Subjective: pt alert, mentation seems to be improving each day Assessment / Plan / Recommendation CHL IP CLINICAL IMPRESSIONS 02/28/2018 Clinical Impression Pt has improved strength and timing for swallow, with more oropharyngeal clearance and airway protection. He is better able to self-feed and has more oral containment before the swallow. He has  mild lingual residue with solids that he clears spontaneously. In trying to clear the barium tablet, he had one instance of premature spillage with thin liquids via straw that was aspirated silently. When drinking thin liquids in isolation, he had occasionaly, trace penetration of thin liquids via straw that cleared easily with a cued throat clear. Recommend starting Dys 3 diet and thin liquids by cup. Education was provided about silent nature of aspiration and the importance of avoiding mixed consistencies for the time being. SLP will continue to follow for tolerance and readiness to advance. SLP Visit  Diagnosis Dysphagia, oropharyngeal phase (R13.12) Attention and concentration deficit following -- Frontal lobe and executive function deficit following -- Impact on safety and function Mild aspiration risk   CHL IP TREATMENT RECOMMENDATION 02/28/2018 Treatment Recommendations Therapy as outlined in treatment plan below   Prognosis 02/28/2018 Prognosis for Safe Diet Advancement Good Barriers to Reach Goals -- Barriers/Prognosis Comment -- CHL IP DIET RECOMMENDATION 02/28/2018 SLP Diet Recommendations Dysphagia 3 (Mech soft) solids;Thin liquid Liquid Administration via Cup;No straw Medication Administration Whole meds with puree Compensations Slow rate;Small sips/bites Postural Changes Seated upright at 90 degrees   CHL IP OTHER RECOMMENDATIONS 02/28/2018 Recommended Consults -- Oral Care Recommendations Oral care BID Other Recommendations --   CHL IP FOLLOW UP RECOMMENDATIONS 02/28/2018 Follow up Recommendations Inpatient Rehab   CHL IP FREQUENCY AND DURATION 02/28/2018 Speech Therapy Frequency (ACUTE ONLY) min 2x/week Treatment Duration 2 weeks      CHL IP ORAL PHASE 02/28/2018 Oral Phase Impaired Oral - Pudding Teaspoon -- Oral - Pudding Cup -- Oral - Honey Teaspoon NT Oral - Honey Cup -- Oral - Nectar Teaspoon NT Oral - Nectar Cup WFL Oral - Nectar Straw -- Oral - Thin Teaspoon NT Oral - Thin Cup WFL Oral - Thin  Straw Premature spillage Oral - Puree Lingual/palatal residue;Delayed oral transit Oral - Mech Soft Lingual/palatal residue;Delayed oral transit Oral - Regular -- Oral - Multi-Consistency -- Oral - Pill Lingual/palatal residue;Delayed oral transit Oral Phase - Comment --  CHL IP PHARYNGEAL PHASE 02/28/2018 Pharyngeal Phase Impaired Pharyngeal- Pudding Teaspoon -- Pharyngeal -- Pharyngeal- Pudding Cup -- Pharyngeal -- Pharyngeal- Honey Teaspoon NT Pharyngeal -- Pharyngeal- Honey Cup -- Pharyngeal -- Pharyngeal- Nectar Teaspoon NT Pharyngeal -- Pharyngeal- Nectar Cup WFL Pharyngeal -- Pharyngeal- Nectar Straw -- Pharyngeal -- Pharyngeal- Thin Teaspoon NT Pharyngeal -- Pharyngeal- Thin Cup [facility] Pharyngeal Material does not enter airway Pharyngeal- Thin Straw Penetration/Aspiration before swallow Pharyngeal Material enters airway, passes BELOW cords without attempt by patient to eject out (silent aspiration);Material enters airway, remains ABOVE vocal cords and not ejected out Pharyngeal- Puree [facility] Pharyngeal Material does not enter airway Pharyngeal- Mechanical Soft WFL Pharyngeal -- Pharyngeal- Regular -- Pharyngeal -- Pharyngeal- Multi-consistency -- Pharyngeal -- Pharyngeal- Pill WFL Pharyngeal -- Pharyngeal Comment --  CHL IP CERVICAL ESOPHAGEAL PHASE 02/28/2018 Cervical Esophageal Phase WFL Pudding Teaspoon -- Pudding Cup -- Honey Teaspoon -- Honey Cup -- Nectar Teaspoon -- Nectar Cup -- Nectar Straw -- Thin Teaspoon -- Thin Cup -- Thin Straw -- Puree -- Mechanical Soft -- Regular -- Multi-consistency -- Pill -- Cervical Esophageal Comment -- Maxcine Ham 02/28/2018, 2:41 PM  Maxcine Ham, M.A. CCC-SLP Acute Rehabilitation Services Pager [phone] Office [phone]               Scheduled Meds: . bacitracin   Topical BID  . Chlorhexidine Gluconate Cloth  6 each Topical Q0600  . darbepoetin (ARANESP) injection - NON-DIALYSIS  100 mcg Subcutaneous Q Tue-1800  . guaiFENesin  10 mL Per Tube TID    . heparin injection (subcutaneous)  5,000 Units Subcutaneous Q8H  . Influenza vac split quadrivalent PF  0.5 mL Intramuscular Tomorrow-1000  . pantoprazole  40 mg Oral Daily  . sodium chloride flush  10-40 mL Intracatheter Q12H   Continuous Infusions: . sodium chloride Stopped (02/21/18 1536)  . feeding supplement (NEPRO CARB STEADY) Stopped (02/28/18 1545)     LOS: 20 days   Time Spent in minutes   30 minutes  Maor Meckel D.O. on 03/01/2018 at 11:54 AM  Between 7am to 7pm -  Please see pager noted on [email or web address]  After 7pm go to [email or web address]  And look for the night coverage person covering for me after hours  [facility] Office  [phone]

## 2018-03-02 LAB — RENAL FUNCTION PANEL
Albumin: 2 g/dL — ABNORMAL LOW (ref 3.5–5.0)
Anion gap: 10 (ref 5–15)
BUN: 29 mg/dL — ABNORMAL HIGH (ref 6–20)
CO2: 27 mmol/L (ref 22–32)
Calcium: 8.4 mg/dL — ABNORMAL LOW (ref 8.9–10.3)
Chloride: 100 mmol/L (ref 98–111)
Creatinine, Ser: 3.66 mg/dL — ABNORMAL HIGH (ref 0.61–1.24)
GFR calc Af Amer: 21 mL/min — ABNORMAL LOW (ref >60)
GFR calc non Af Amer: 18 mL/min — ABNORMAL LOW (ref >60)
Glucose, Bld: 97 mg/dL (ref 70–99)
Phosphorus: 4.7 mg/dL — ABNORMAL HIGH (ref 2.5–4.6)
Potassium: 3.8 mmol/L (ref 3.5–5.1)
Sodium: 137 mmol/L (ref 135–145)

## 2018-03-02 LAB — HEMOGLOBIN AND HEMATOCRIT, BLOOD
HCT: 24.3 % — ABNORMAL LOW (ref 39.0–52.0)
Hemoglobin: 7.7 g/dL — ABNORMAL LOW (ref 13.0–17.0)

## 2018-03-02 LAB — PREPARE RBC (CROSSMATCH)

## 2018-03-02 MED ORDER — SODIUM CHLORIDE 0.9% IV SOLUTION
Freq: Once | INTRAVENOUS | Status: AC
  Administered 2018-03-02: 18:00:00 via INTRAVENOUS

## 2018-03-02 NOTE — Progress Notes (Signed)
PROGRESS NOTE    DELAWRENCE FRIDMAN  ZOX:[id number] DOB: [birth year] DOA: 02/09/2018 PCP: [name], MD   Brief Narrative:  HPI On 02/09/2018 by Dr. [name] LAINE GIOVANETTI is a 47 y.o. male who denies any significant past medical history, now presenting to the emergency department with approximately 10 days of fevers, generalized aches, abdominal pain, nausea, vomiting, diarrhea, cough, and shortness of breath.  Symptoms began with fevers, generalized aches, abdominal discomfort, and nausea with nonbloody vomiting, and diarrhea, but have progressed to include worsening shortness of breath, productive cough, melena, and then bright red blood per rectum.  Patient had been taking Advil every 4 hours for his symptoms, developed melena after few days, but reports more recent bright red blood in his stool.  Abdominal pain is mainly epigastric.  Denies chest pain.  Denies leg swelling or tenderness.  No headache, change in vision or hearing, or focal numbness or weakness.  He has never experienced these symptoms previously.  No recent travel or sick contacts.  Reports drinking 2-3 beers daily, denies illicit drug use, and denies ever experiencing withdrawal symptoms.  Interim history Admitted with sepsis secondary to multifocal pneumonia.  Found to also have acute hypoxic respiratory failure as well as acute kidney injury, gastritis with anemia. Also developed AKI and required CRRT, nephrology following.  Last HD treatment was on 03/01/2018.  Now pending CIR and further nephrology recommendations.  Assessment & Plan   Acute hypoxic respiratory failure  -Patient noted to have hypoxia on admission, oxygen saturations at rest 87% -secondary to Legionella pneumonia, ARDS  -Influenza PCR negative -Continue to treat underlying condition -appears to be improving - currently on room air  Septic shock secondary to Legionella pneumonia and ARDS -Patient presented with fever, tachycardia, tachypnea,  hypoxia -Initially placed on azithromycin, ceftriaxone followed by Zosyn and vancomycin, eventually transitioned to IV Levaquin and completed course of antibiotics -Patient required IV pressors and was admitted to the ICU.  Was also placed on IV stress dose steroids while in the ICU which have been discontinued -Blood pressure does appear to be stable -Appears patient had hypoxia that required pronating position  Acute toxic and metabolic encephalopathy -Altered factorial including ICU/hospital induced delirium, uremia, hypoxic injury, medications, sepsis -Speech therapy consulted for cognitive and swallowing evaluation -Resolved, appears to be AAOx3  Acute kidney injury with mixed anion gap and non-anion gap metabolic and respiratory acidosis -Nephrology was consulted and appreciated -Secondary to prerenal etiology, ATN and hemodynamic disturbances -Patient did require CRRT in the ICU -Currently requiring intermittent hemodialysis as per nephrology -Last hemodialysis 03/01/2018, creatinine currently down to 3.66 -Continue follow BMP  Dysphagia -Speech therapy consulted- s/p MBS, placed on dysphagia 3 diet -Cortrack removed  Normocytic Anemia/GI bleeding -Anemia likely multifactorial due to acute blood loss secondary to GI bleeding, critical illness, sepsis and renal disease -On admission, patient reported epigastric pain with melena after taking Advil every 4 hours for fever and aches. -FOBT positive -Continue PPI twice daily -Patient does not appear to have any active GI bleeding, GI was not consulted -Hemoglobin dropped to 6.8, transfused 1 unit PRBC -hemoglobin stable, 7.7 -Continue to monitor CBC  Atrial fibrillation with RVR -Appears paroxysmal, currently patient in sinus rhythm -Was on amiodarone for rate control -Anticoagulation was deferred secondary to GI bleed -Will likely not need anticoagulation moving forward given that patient does not appear to have further  episodes of atrial fibrillation  Elevated LFTs -Secondary to sepsis vs medications vs ?alcohol use -Upon review of chart,  appears to have trended downward and currently normalized  Bilateral leg pain -CK normal.  Etiology unclear -Continue symptomatic treatment, pain control  Diarrhea -Suspect secondary to tube feeding and antibiotic use -Currently no leukocytosis, no abdominal distention nausea or vomiting -Does not appear to be C. difficile, patient was placed on Imodium -Abdominal x-ray showed no acute abnormality  Left upper extremity pain/upper extremity basilic SVT -Mild edema noted -Currently not at risk for further worsening of DVT.  Given bleeding risk anticoagulation held -Treat with warm compresses  Pressure injury -Unstageable, on penis. -Continue wound care -not present on admission  Deconditioning -PT rec CIR -pending OR consult -Inpatient rehab consulted- patient potential candidate  DVT Prophylaxis  SCDs  Code Status: Full  Family Communication: Wife at bedside  Disposition Plan: Admitted.  Dispel TBD.  Pending inpatient rehab.  Also pending further recommendations from nephrology regarding possible need for further HD.  Consultants [facility] Nephrology Inpatient rehab  Procedures  Intubation extubation, 12/9 -12/18 Left IJ CVL Right IJ HD cath  Antibiotics   Anti-infectives (From admission, onward)   Start     Dose/Rate Route Frequency Ordered Stop   02/19/18 1000  levofloxacin (LEVAQUIN) IVPB 500 mg     500 mg 100 mL/hr over 60 Minutes Intravenous Every 48 hours 02/17/18 1106 02/23/18 1859   02/14/18 0900  Levofloxacin (LEVAQUIN) IVPB 250 mg  Status:  Discontinued     250 mg 50 mL/hr over 60 Minutes Intravenous Every 24 hours 02/13/18 0815 02/17/18 1106   02/13/18 0830  levofloxacin (LEVAQUIN) IVPB 500 mg     500 mg 100 mL/hr over 60 Minutes Intravenous  Once 02/13/18 0815 02/13/18 1150   02/12/18 2000  vancomycin (VANCOCIN) 1,500 mg in sodium  chloride 0.9 % 500 mL IVPB  Status:  Discontinued     1,500 mg 250 mL/hr over 120 Minutes Intravenous Every 24 hours 02/11/18 1406 02/11/18 1521   02/12/18 2000  piperacillin-tazobactam (ZOSYN) IVPB 3.375 g  Status:  Discontinued     3.375 g 100 mL/hr over 30 Minutes Intravenous Every 6 hours 02/12/18 1408 02/13/18 0815   02/10/18 2000  vancomycin (VANCOCIN) 1,250 mg in sodium chloride 0.9 % 250 mL IVPB  Status:  Discontinued     1,250 mg 166.7 mL/hr over 90 Minutes Intravenous Every 24 hours 02/09/18 1839 02/10/18 0728   02/10/18 1530  vancomycin (VANCOCIN) IVPB 750 mg/150 ml premix  Status:  Discontinued     750 mg 150 mL/hr over 60 Minutes Intravenous Every 12 hours 02/10/18 1432 02/11/18 1406   02/10/18 1400  piperacillin-tazobactam (ZOSYN) IVPB 3.375 g  Status:  Discontinued     3.375 g 12.5 mL/hr over 240 Minutes Intravenous Every 8 hours 02/10/18 0731 02/12/18 1408   02/10/18 0745  piperacillin-tazobactam (ZOSYN) IVPB 3.375 g     3.375 g 100 mL/hr over 30 Minutes Intravenous  Once 02/10/18 0728 02/10/18 0921   02/10/18 0600  ceFEPIme (MAXIPIME) 2 g in sodium chloride 0.9 % 100 mL IVPB  Status:  Discontinued     2 g 200 mL/hr over 30 Minutes Intravenous Every 12 hours 02/09/18 1839 02/09/18 2014   02/10/18 0200  cefTRIAXone (ROCEPHIN) 1 g in sodium chloride 0.9 % 100 mL IVPB  Status:  Discontinued     1 g 200 mL/hr over 30 Minutes Intravenous Every 24 hours 02/09/18 2014 02/10/18 0728   02/09/18 2015  azithromycin (ZITHROMAX) 500 mg in sodium chloride 0.9 % 250 mL IVPB  Status:  Discontinued  500 mg 250 mL/hr over 60 Minutes Intravenous Every 24 hours 02/09/18 2014 02/12/18 1306   02/09/18 1830  vancomycin (VANCOCIN) 1,250 mg in sodium chloride 0.9 % 250 mL IVPB     1,250 mg 166.7 mL/hr over 90 Minutes Intravenous  Once 02/09/18 1800 02/09/18 2127   02/09/18 1800  ceFEPIme (MAXIPIME) 2 g in sodium chloride 0.9 % 100 mL IVPB     2 g 200 mL/hr over 30 Minutes Intravenous  Once  02/09/18 1745 02/09/18 1925   02/09/18 1800  metroNIDAZOLE (FLAGYL) IVPB 500 mg  Status:  Discontinued     500 mg 100 mL/hr over 60 Minutes Intravenous Every 8 hours 02/09/18 1745 02/10/18 0728   02/09/18 1800  vancomycin (VANCOCIN) IVPB 1000 mg/200 mL premix  Status:  Discontinued     1,000 mg 200 mL/hr over 60 Minutes Intravenous  Once 02/09/18 1745 02/09/18 1800      Subjective:   Phineas RealGary Mcmanaman seen and examined today.  Patient feeling much better today.  Currently sitting up and taking a shower or bath and shaving.  Denies current chest pain, shortness breath, abdominal pain, nausea or vomiting, diarrhea or constipation, dizziness or headache.   Objective:   Vitals:   03/01/18 1613 03/01/18 1754 03/01/18 2315 03/02/18 0727  BP: 115/69 122/82 126/76 117/71  Pulse: 73 79 87 79  Resp: 18 12 16 12   Temp: 98.7 F (37.1 C) 98.5 F (36.9 C) 98.4 F (36.9 C) 98.4 F (36.9 C)  TempSrc: Oral Oral Oral Oral  SpO2: 96% 99% 98% 98%  Weight: 61 kg     Height:        Intake/Output Summary (Last 24 hours) at 03/02/2018 1058 Last data filed at 03/02/2018 0500 Gross per 24 hour  Intake -  Output 550 ml  Net -550 ml   Filed Weights   02/28/18 0650 03/01/18 1255 03/01/18 1613  Weight: 62.6 kg 61 kg 61 kg   Exam  General: Well developed, thin, NAD  HEENT: NCAT,mucous membranes moist.   Neck: Supple  Cardiovascular: S1 S2 auscultated, RRR, no murmur  Respiratory: Clear to auscultation bilaterally   Abdomen: Soft, nontender, nondistended, + bowel sounds  Extremities: warm dry without cyanosis clubbing or edema  Neuro: AAOx3, nonfocal  Psych: Pleasant, appropriate mood and affect  Data Reviewed: I have personally reviewed following labs and imaging studies  CBC: Recent Labs  Lab 02/25/18 0500 02/26/18 0430 02/26/18 1803 02/27/18 0539 02/28/18 0644 03/01/18 0758 03/02/18 0500  WBC 13.0* 11.4*  --  10.5 10.1 8.6  --   HGB 7.2* 6.8* 7.4* 7.6* 7.6* 7.7* 7.7*  HCT  22.4* 22.1* 24.1* 24.5* 23.7* 24.3* 24.3*  MCV 94.1 96.9  --  94.2 92.2 92.4  --   PLT PLATELET CLUMPS NOTED ON SMEAR, COUNT APPEARS DECREASED 150  --  PLATELET CLUMPS NOTED ON SMEAR, UNABLE TO ESTIMATE PLATELET CLUMPS NOTED ON SMEAR, UNABLE TO ESTIMATE 175  --    Basic Metabolic Panel: Recent Labs  Lab 02/26/18 0430 02/27/18 0539 02/28/18 0644 03/01/18 0758 03/02/18 0500  NA 136 132* 132* 133* 137  K 4.1 3.8 3.8 3.7 3.8  CL 96* 93* 93* 95* 100  CO2 28 28 26 24 27   GLUCOSE 97 105* 99 97 97  BUN 33* 46* 61* 63* 29*  CREATININE 4.16* 5.65* 6.30* 6.16* 3.66*  CALCIUM 8.1* 8.2* 8.4* 8.4* 8.4*  PHOS 5.9* 5.8* 5.6* 5.7* 4.7*   GFR: Estimated Creatinine Clearance: 21.1 mL/min (A) (by C-G formula based on  SCr of 3.66 mg/dL (H)). Liver Function Tests: Recent Labs  Lab 02/26/18 0430 02/27/18 0539 02/28/18 0644 03/01/18 0758 03/02/18 0500  ALBUMIN 1.7* 1.8* 1.9* 2.1* 2.0*   No results for input(s): LIPASE, AMYLASE in the last 168 hours. No results for input(s): AMMONIA in the last 168 hours. Coagulation Profile: No results for input(s): INR, PROTIME in the last 168 hours. Cardiac Enzymes: No results for input(s): CKTOTAL, CKMB, CKMBINDEX, TROPONINI in the last 168 hours. BNP (last 3 results) No results for input(s): PROBNP in the last 8760 hours. HbA1C: No results for input(s): HGBA1C in the last 72 hours. CBG: Recent Labs  Lab 02/27/18 1947 02/27/18 2317 02/28/18 0742 02/28/18 1252 03/01/18 0743  GLUCAP 101* 95 103* 116* 88   Lipid Profile: No results for input(s): CHOL, HDL, LDLCALC, TRIG, CHOLHDL, LDLDIRECT in the last 72 hours. Thyroid Function Tests: No results for input(s): TSH, T4TOTAL, FREET4, T3FREE, THYROIDAB in the last 72 hours. Anemia Panel: No results for input(s): VITAMINB12, FOLATE, FERRITIN, TIBC, IRON, RETICCTPCT in the last 72 hours. Urine analysis:    Component Value Date/Time   COLORURINE AMBER (A) 02/09/2018 1816   APPEARANCEUR CLEAR  02/09/2018 1816   LABSPEC 1.019 02/09/2018 1816   PHURINE 6.0 02/09/2018 1816   GLUCOSEU NEGATIVE 02/09/2018 1816   HGBUR SMALL (A) 02/09/2018 1816   BILIRUBINUR NEGATIVE 02/09/2018 1816   KETONESUR NEGATIVE 02/09/2018 1816   PROTEINUR 100 (A) 02/09/2018 1816   UROBILINOGEN 1.0 10/21/2014 0951   NITRITE NEGATIVE 02/09/2018 1816   LEUKOCYTESUR NEGATIVE 02/09/2018 1816   Sepsis Labs: @LABRCNTIP (procalcitonin:4,lacticidven:4)  )No results found for this or any previous visit (from the past 240 hour(s)).    Radiology Studies: Dg Swallowing Func-speech Pathology  Result Date: 02/28/2018 Objective Swallowing Evaluation: Type of Study: MBS-Modified Barium Swallow Study  Patient Details Name: Nolon BussingGary W Hufstetler MRN: [id number] Date of Birth: 1971/01/10 Today's Date: 02/28/2018 Time: SLP Start Time (ACUTE ONLY): 1341 -SLP Stop Time (ACUTE ONLY): 1359 SLP Time Calculation (min) (ACUTE ONLY): 18 min Past Medical History: Past Medical History: Diagnosis Date . Drug overdose, intentional [facility](HCC)  Past Surgical History: Past Surgical History: Procedure Laterality Date . FASCIOTOMY  09/08/2011  Procedure: FASCIOTOMY;  Surgeon: Sherren Kernsharles E Fields, MD;  [facility];  Service: Vascular;  Laterality: Left; . FEMORAL-POPLITEAL BYPASS GRAFT  09/08/2011  Procedure: BYPASS GRAFT FEMORAL-POPLITEAL ARTERY;  Surgeon: Sherren Kernsharles E Fields, MD;  [facility];  Service: Vascular;  Laterality: Left; . NO PAST SURGERIES   HPI: 47 yo male admitted 02/09/18 w/ CAP, legionella infection, probable gastritis (from NSAIDS) and progressive hypoxic resp failure. Developed severe ARDS requiring ETT 12/9-12/18, paralysis, and proning. PMH: intentional overdose. CXR = Diffuse right lung airspace disease and left lower lobe airspace opacity, worsened since last CXR  Subjective: pt alert, mentation seems to be improving each day Assessment / Plan / Recommendation CHL IP CLINICAL IMPRESSIONS 02/28/2018 Clinical Impression Pt has improved strength and  timing for swallow, with more oropharyngeal clearance and airway protection. He is better able to self-feed and has more oral containment before the swallow. He has mild lingual residue with solids that he clears spontaneously. In trying to clear the barium tablet, he had one instance of premature spillage with thin liquids via straw that was aspirated silently. When drinking thin liquids in isolation, he had occasionaly, trace penetration of thin liquids via straw that cleared easily with a cued throat clear. Recommend starting Dys 3 diet and thin liquids by cup. Education was provided about silent nature of  aspiration and the importance of avoiding mixed consistencies for the time being. SLP will continue to follow for tolerance and readiness to advance. SLP Visit Diagnosis Dysphagia, oropharyngeal phase (R13.12) Attention and concentration deficit following -- Frontal lobe and executive function deficit following -- Impact on safety and function Mild aspiration risk   CHL IP TREATMENT RECOMMENDATION 02/28/2018 Treatment Recommendations Therapy as outlined in treatment plan below   Prognosis 02/28/2018 Prognosis for Safe Diet Advancement Good Barriers to Reach Goals -- Barriers/Prognosis Comment -- CHL IP DIET RECOMMENDATION 02/28/2018 SLP Diet Recommendations Dysphagia 3 (Mech soft) solids;Thin liquid Liquid Administration via Cup;No straw Medication Administration Whole meds with puree Compensations Slow rate;Small sips/bites Postural Changes Seated upright at 90 degrees   CHL IP OTHER RECOMMENDATIONS 02/28/2018 Recommended Consults -- Oral Care Recommendations Oral care BID Other Recommendations --   CHL IP FOLLOW UP RECOMMENDATIONS 02/28/2018 Follow up Recommendations Inpatient Rehab   CHL IP FREQUENCY AND DURATION 02/28/2018 Speech Therapy Frequency (ACUTE ONLY) min 2x/week Treatment Duration 2 weeks      CHL IP ORAL PHASE 02/28/2018 Oral Phase Impaired Oral - Pudding Teaspoon -- Oral - Pudding Cup -- Oral -  Honey Teaspoon NT Oral - Honey Cup -- Oral - Nectar Teaspoon NT Oral - Nectar Cup WFL Oral - Nectar Straw -- Oral - Thin Teaspoon NT Oral - Thin Cup WFL Oral - Thin Straw Premature spillage Oral - Puree Lingual/palatal residue;Delayed oral transit Oral - Mech Soft Lingual/palatal residue;Delayed oral transit Oral - Regular -- Oral - Multi-Consistency -- Oral - Pill Lingual/palatal residue;Delayed oral transit Oral Phase - Comment --  CHL IP PHARYNGEAL PHASE 02/28/2018 Pharyngeal Phase Impaired Pharyngeal- Pudding Teaspoon -- Pharyngeal -- Pharyngeal- Pudding Cup -- Pharyngeal -- Pharyngeal- Honey Teaspoon NT Pharyngeal -- Pharyngeal- Honey Cup -- Pharyngeal -- Pharyngeal- Nectar Teaspoon NT Pharyngeal -- Pharyngeal- Nectar Cup WFL Pharyngeal -- Pharyngeal- Nectar Straw -- Pharyngeal -- Pharyngeal- Thin Teaspoon NT Pharyngeal -- Pharyngeal- Thin Cup [facility] Pharyngeal Material does not enter airway Pharyngeal- Thin Straw Penetration/Aspiration before swallow Pharyngeal Material enters airway, passes BELOW cords without attempt by patient to eject out (silent aspiration);Material enters airway, remains ABOVE vocal cords and not ejected out Pharyngeal- Puree [facility] Pharyngeal Material does not enter airway Pharyngeal- Mechanical Soft WFL Pharyngeal -- Pharyngeal- Regular -- Pharyngeal -- Pharyngeal- Multi-consistency -- Pharyngeal -- Pharyngeal- Pill WFL Pharyngeal -- Pharyngeal Comment --  CHL IP CERVICAL ESOPHAGEAL PHASE 02/28/2018 Cervical Esophageal Phase WFL Pudding Teaspoon -- Pudding Cup -- Honey Teaspoon -- Honey Cup -- Nectar Teaspoon -- Nectar Cup -- Nectar Straw -- Thin Teaspoon -- Thin Cup -- Thin Straw -- Puree -- Mechanical Soft -- Regular -- Multi-consistency -- Pill -- Cervical Esophageal Comment -- Maxcine Ham 02/28/2018, 2:41 PM  Maxcine Ham, M.A. CCC-SLP Acute Rehabilitation Services Pager [phone] Office [phone]               Scheduled Meds: . bacitracin   Topical BID  .  Chlorhexidine Gluconate Cloth  6 each Topical Q0600  . darbepoetin (ARANESP) injection - NON-DIALYSIS  100 mcg Subcutaneous Q Tue-1800  . guaiFENesin  10 mL Per Tube TID  . heparin injection (subcutaneous)  5,000 Units Subcutaneous Q8H  . Influenza vac split quadrivalent PF  0.5 mL Intramuscular Tomorrow-1000  . pantoprazole  40 mg Oral Daily  . sodium chloride flush  10-40 mL Intracatheter Q12H   Continuous Infusions: . sodium chloride Stopped (02/21/18 1536)  . feeding supplement (NEPRO CARB STEADY) Stopped (02/28/18 1545)     LOS: 21 days  Time Spent in minutes   30 minutes  Kylie Gros D.O. on 03/02/2018 at 10:58 AM  Between 7am to 7pm - Please see pager noted on [email or web address]  After 7pm go to [email or web address]  And look for the night coverage person covering for me after hours  [facility] Office  [phone]

## 2018-03-02 NOTE — Evaluation (Signed)
Occupational Therapy Evaluation Patient Details Name: Roberto Knapp MRN: [id number] DOB: October 22, 1970 Today's Date: 03/02/2018    History of Present Illness Pt is a 47 y.o. M with no significant PMH who was admitted with fever, aches, SOB, abdominal pain, N/V, BRBPR who was found to have CAP, legionella infection, and progressive hypoxic failure. Developed severe ARDS requiring paralysis and proning. Intubated 12/9-12/18   Clinical Impression   PTA patient independent and working.  Admitted for above and limited by problem list below.  Completes mobility and transfers with min guard assist, UB ADls with supervision and LB ADLs with min guard assist.  Patient reports cognition improving, but continues to present with deficits in short term memory, sequencing, and attention (Short blessed test: 8/28). Patient will benefit from continued OT services while admitted and may need follow up HHOT services at discharge in order to optimize independence and safety with ADLs/IADLs.  Will continue to follow and updated dc recommendations as needed.     Follow Up Recommendations  Home health OT;Supervision/Assistance - 24 hour(may progress to no OT folllow up)    Equipment Recommendations  3 in 1 bedside commode    Recommendations for Other Services       Precautions / Restrictions Precautions Precautions: Fall Restrictions Weight Bearing Restrictions: No      Mobility Bed Mobility Overal bed mobility: Modified Independent                Transfers Overall transfer level: Needs assistance Equipment used: None Transfers: Sit to/from Stand Sit to Stand: Min guard         General transfer comment: min guard for safety and balance     Balance Overall balance assessment: Needs assistance Sitting-balance support: Feet supported;Bilateral upper extremity supported Sitting balance-Leahy Scale: Good Sitting balance - Comments: dynamic balance managing socks without assist   Standing  balance support: No upper extremity supported;During functional activity Standing balance-Leahy Scale: Fair Standing balance comment: min guard for safety                            ADL either performed or assessed with clinical judgement   ADL Overall ADL's : Needs assistance/impaired     Grooming: Wash/dry hands;Min guard;Standing   Upper Body Bathing: Set up;Sitting   Lower Body Bathing: Min guard;Sit to/from stand   Upper Body Dressing : Supervision/safety;Set up;Sitting   Lower Body Dressing: Min guard;Sit to/from stand   Toilet Transfer: Min guard;Ambulation Toilet Transfer Details (indicate cue type and reason): simulated in room     Tub/ Shower Transfer: Tub transfer;Min guard;Ambulation;3 in 1 Tub/Shower Transfer Details (indicate cue type and reason): simulated in room Functional mobility during ADLs: Min guard General ADL Comments: pt limited by decreased activity tolerance, generalized weakness, cognition      Vision Baseline Vision/History: Wears glasses Wears Glasses: At all times Patient Visual Report: No change from baseline Vision Assessment?: No apparent visual deficits     Perception     Praxis      Pertinent Vitals/Pain Pain Assessment: No/denies pain     Hand Dominance Right   Extremity/Trunk Assessment Upper Extremity Assessment Upper Extremity Assessment: Generalized weakness   Lower Extremity Assessment Lower Extremity Assessment: Defer to PT evaluation   Cervical / Trunk Assessment Cervical / Trunk Assessment: Normal   Communication Communication Communication: No difficulties   Cognition Arousal/Alertness: Awake/alert Behavior During Therapy: WFL for tasks assessed/performed Overall Cognitive Status: Impaired/Different from baseline Area of Impairment: Attention;Memory;Awareness;Problem  solving                   Current Attention Level: Sustained Memory: Decreased short-term memory Following Commands:  Follows one step commands with increased time   Awareness: Emergent Problem Solving: Requires verbal cues;Difficulty sequencing General Comments: Short blessed test completed: 8/28 (questionable impairment)    General Comments       Exercises     Shoulder Instructions      Home Living Family/patient expects to be discharged to:: Private residence Living Arrangements: Spouse/significant other;Other (Comment)(brother) Available Help at Discharge: Family Type of Home: House Home Access: Stairs to enter Entergy CorporationEntrance Stairs-Number of Steps: 4 Entrance Stairs-Rails: Can reach both Home Layout: One level     Bathroom Shower/Tub: Chief Strategy OfficerTub/shower unit   Bathroom Toilet: Standard     Home Equipment: None          Prior Functioning/Environment Level of Independence: Independent        Comments: working full time, driving         OT Problem List: Decreased strength;Decreased activity tolerance;Impaired balance (sitting and/or standing);Decreased cognition;Decreased safety awareness;Decreased knowledge of use of DME or AE      OT Treatment/Interventions: Self-care/ADL training;Therapeutic exercise;Energy conservation;DME and/or AE instruction;Therapeutic activities;Patient/family education;Balance training    OT Goals(Current goals can be found in the care plan section) Acute Rehab OT Goals Patient Stated Goal: to get stronger OT Goal Formulation: With patient Time For Goal Achievement: 03/16/18 Potential to Achieve Goals: Good  OT Frequency: Min 2X/week   Barriers to D/C:            Co-evaluation              AM-PAC OT "6 Clicks" Daily Activity     Outcome Measure Help from another person eating meals?: None Help from another person taking care of personal grooming?: A Little Help from another person toileting, which includes using toliet, bedpan, or urinal?: A Little Help from another person bathing (including washing, rinsing, drying)?: A Little Help from  another person to put on and taking off regular upper body clothing?: None Help from another person to put on and taking off regular lower body clothing?: A Little 6 Click Score: 20   End of Session Equipment Utilized During Treatment: Gait belt Nurse Communication: Mobility status  Activity Tolerance: Patient tolerated treatment well Patient left: in bed;with call bell/phone within reach;with family/visitor present  OT Visit Diagnosis: Unsteadiness on feet (R26.81);Muscle weakness (generalized) (M62.81);Other symptoms and signs involving cognitive function                Time: [id number] OT Time Calculation (min): 19 min Charges:  OT General Charges $OT Visit: 1 Visit OT Evaluation $OT Eval Moderate Complexity: 1 Mod  Chancy Milroyhristie S Jlynn Langille, OT Acute Rehabilitation Services Pager [phone] Office [phone]   Chancy MilroyChristie S Alayha Babineaux 03/02/2018, 4:13 PM

## 2018-03-02 NOTE — Progress Notes (Addendum)
[facility] ROUNDING NOTE   Subjective:   Last HD treatment was 12/28 for clearance (no fluid removed).  We discussed risks/benefits/indications for transfusion and he is willing to get blood today.  He is a little discouraged because he is confused about his renal function - we discussed that he appears to be getting better clearance but that he had dialysis yesterday.  He had misinterpreted earlier today that "everything was fine and better".   Review of systems:  Denies shortness of breath or cough  Denies chest pain Denies nausea or vomiting    Objective:  Vital signs in last 24 hours:  Temp:  [98.4 F (36.9 C)-98.7 F (37.1 C)] 98.4 F (36.9 C) (12/29 0727) Pulse Rate:  [59-87] 79 (12/29 0727) Resp:  [12-18] 12 (12/29 0727) BP: (108-131)/(69-84) 117/71 (12/29 0727) SpO2:  [96 %-99 %] 98 % (12/29 0727) Weight:  [61 kg] 61 kg (12/28 1613)  Weight change:  Filed Weights   02/28/18 0650 03/01/18 1255 03/01/18 1613  Weight: 62.6 kg 61 kg 61 kg    Intake/Output: I/O last 3 completed shifts: In: -  Out: 1650 [Urine:1650]   Intake/Output this shift:  No intake/output data recorded.  General adult male in bed in no acute distress HEENT normocephalic atraumatic extraocular movements intact sclera anicteric Neck supple trachea midline Lungs clear to auscultation bilaterally normal work of breathing at rest  Heart regular rate and rhythm no rubs or gallops appreciated Abdomen soft nontender nondistended Extremities no edema  Psych normal mood and affect Right IJ vascath in place   Basic Metabolic Panel: Recent Labs  Lab 02/26/18 0430 02/27/18 0539 02/28/18 0644 03/01/18 0758 03/02/18 0500  NA 136 132* 132* 133* 137  K 4.1 3.8 3.8 3.7 3.8  CL 96* 93* 93* 95* 100  CO2 28 28 26 24 27   GLUCOSE 97 105* 99 97 97  BUN 33* 46* 61* 63* 29*  CREATININE 4.16* 5.65* 6.30* 6.16* 3.66*  CALCIUM 8.1* 8.2* 8.4* 8.4* 8.4*  PHOS 5.9* 5.8* 5.6* 5.7* 4.7*     Liver Function Tests: Recent Labs  Lab 02/26/18 0430 02/27/18 0539 02/28/18 0644 03/01/18 0758 03/02/18 0500  ALBUMIN 1.7* 1.8* 1.9* 2.1* 2.0*    CBC: Recent Labs  Lab 02/25/18 0500 02/26/18 0430 02/26/18 1803 02/27/18 0539 02/28/18 0644 03/01/18 0758 03/02/18 0500  WBC 13.0* 11.4*  --  10.5 10.1 8.6  --   HGB 7.2* 6.8* 7.4* 7.6* 7.6* 7.7* 7.7*  HCT 22.4* 22.1* 24.1* 24.5* 23.7* 24.3* 24.3*  MCV 94.1 96.9  --  94.2 92.2 92.4  --   PLT PLATELET CLUMPS NOTED ON SMEAR, COUNT APPEARS DECREASED 150  --  PLATELET CLUMPS NOTED ON SMEAR, UNABLE TO ESTIMATE PLATELET CLUMPS NOTED ON SMEAR, UNABLE TO ESTIMATE 175  --     Medications:   . sodium chloride Stopped (02/21/18 1536)  . feeding supplement (NEPRO CARB STEADY) Stopped (02/28/18 1545)   . bacitracin   Topical BID  . Chlorhexidine Gluconate Cloth  6 each Topical Q0600  . darbepoetin (ARANESP) injection - NON-DIALYSIS  100 mcg Subcutaneous Q Tue-1800  . guaiFENesin  10 mL Per Tube TID  . heparin injection (subcutaneous)  5,000 Units Subcutaneous Q8H  . Influenza vac split quadrivalent PF  0.5 mL Intramuscular Tomorrow-1000  . pantoprazole  40 mg Oral Daily  . sodium chloride flush  10-40 mL Intracatheter Q12H   sodium chloride, oxyCODONE **AND** acetaminophen, acetaminophen, levalbuterol, [DISCONTINUED] ondansetron **OR** ondansetron (ZOFRAN) IV, sodium chloride flush, traZODone  Assessment/ Plan:   Acute kidney injury in setting of ATN recent history of nonsteroidal anti-inflammatory drugs and IV contrast was started on CRRT 02/12/2018 to 02/17/2018 and started on intermittent hemodialysis 02/19/2018, 02/20/2018, 02/23/2018 and 02/25/2018.  Vas-Cath is currently in place - No acute need for HD today  - Will make NPO after midnight in the event ash split is needed tomorrow (he still has a vascath and has continued to remain dialysis dependent for clearance).  Assess AM labs to determine need - Optimize anemia as  below  Anemia on darbepoetin.  Will transfuse 1 unit PRBC's today   Acute hypoxic respiratory failure secondary to Legionella ARDS with hx of septic shock.  S/p Levaquin   Encephalopathy improved.  Conversant   Atrial fibrillation with rapid ventricular rate anticoagulation deferred.  Now in sinus rhythm   LOS: 21 Estanislado EmmsLori C Foster 03/02/2018  2:22 PM

## 2018-03-03 LAB — TYPE AND SCREEN
ABO/RH(D): A POS
Antibody Screen: NEGATIVE
Unit division: 0

## 2018-03-03 LAB — BPAM RBC
Blood Product Expiration Date: 202001162359
ISSUE DATE / TIME: 201912291828
Unit Type and Rh: 6200

## 2018-03-03 LAB — RENAL FUNCTION PANEL
Albumin: 2 g/dL — ABNORMAL LOW (ref 3.5–5.0)
Anion gap: 11 (ref 5–15)
BUN: 45 mg/dL — ABNORMAL HIGH (ref 6–20)
CO2: 23 mmol/L (ref 22–32)
Calcium: 8.6 mg/dL — ABNORMAL LOW (ref 8.9–10.3)
Chloride: 103 mmol/L (ref 98–111)
Creatinine, Ser: 4.19 mg/dL — ABNORMAL HIGH (ref 0.61–1.24)
GFR calc Af Amer: 18 mL/min — ABNORMAL LOW (ref >60)
GFR calc non Af Amer: 16 mL/min — ABNORMAL LOW (ref >60)
Glucose, Bld: 98 mg/dL (ref 70–99)
Phosphorus: 5.2 mg/dL — ABNORMAL HIGH (ref 2.5–4.6)
Potassium: 4.1 mmol/L (ref 3.5–5.1)
Sodium: 137 mmol/L (ref 135–145)

## 2018-03-03 LAB — HEMOGLOBIN AND HEMATOCRIT, BLOOD
HCT: 28.3 % — ABNORMAL LOW (ref 39.0–52.0)
Hemoglobin: 8.7 g/dL — ABNORMAL LOW (ref 13.0–17.0)

## 2018-03-03 MED ORDER — GUAIFENESIN 100 MG/5ML PO SOLN
10.0000 mL | Freq: Three times a day (TID) | ORAL | Status: DC
  Administered 2018-03-03: 200 mg via ORAL
  Filled 2018-03-03 (×3): qty 10
  Filled 2018-03-03 (×2): qty 5

## 2018-03-03 MED ORDER — SODIUM CHLORIDE 0.9 % IV SOLN
INTRAVENOUS | Status: AC
  Administered 2018-03-03: 11:00:00 via INTRAVENOUS

## 2018-03-03 NOTE — Progress Notes (Signed)
Inpatient Rehabilitation-Admissions Coordinator   Noted pt awaiting procedure planned for tomorrow. Pt has made good gains with OT and PM&R MD is requesting PT reassess today to see if needs still exist. AC has contacted acute therapy office to request PT treatment today.   Will continue to follow.   Nanine MeansKelly Ezra Marquess, OTR/L  Rehab Admissions Coordinator  [phone] 03/03/2018 1:27 PM

## 2018-03-03 NOTE — Progress Notes (Signed)
[facility] ROUNDING NOTE   Subjective:   Feels ok today.  Last HD treatment was 12/28 for clearance.  He had 2.5 liters UOP over 12/29.  His wife is not at bedside and not reachable at work due to nature of her job; he would like to discuss any procedure (I.e ash split with her).  He may be able to call her at lunch.  Still awaiting placement for rehab.   Review of systems:  Denies shortness of breath or cough  Denies chest pain Denies nausea or vomiting    Objective:  Vital signs in last 24 hours:  Temp:  [98.1 F (36.7 C)-98.4 F (36.9 C)] 98.4 F (36.9 C) (12/30 0800) Pulse Rate:  [80-86] 81 (12/30 0800) Resp:  [15-16] 15 (12/30 0800) BP: (121-131)/(82-90) 124/82 (12/30 0800) SpO2:  [98 %-100 %] 98 % (12/30 0800)  Weight change:  Filed Weights   02/28/18 0650 03/01/18 1255 03/01/18 1613  Weight: 62.6 kg 61 kg 61 kg    Intake/Output: I/O last 3 completed shifts: In: 308 [Blood:308] Out: 3035 [Urine:3035]   Intake/Output this shift:  No intake/output data recorded.  General adult male in bed in no acute distress HEENT normocephalic atraumatic extraocular movements intact sclera anicteric Neck supple trachea midline Lungs clear to auscultation bilaterally normal work of breathing at rest  Heart regular rate and rhythm no rubs or gallops appreciated Abdomen soft nontender nondistended Extremities no edema  Psych normal mood and affect Access: RIJ vascath in place   Basic Metabolic Panel: Recent Labs  Lab 02/27/18 0539 02/28/18 0644 03/01/18 0758 03/02/18 0500 03/03/18 0553  NA 132* 132* 133* 137 137  K 3.8 3.8 3.7 3.8 4.1  CL 93* 93* 95* 100 103  CO2 28 26 24 27 23   GLUCOSE 105* 99 97 97 98  BUN 46* 61* 63* 29* 45*  CREATININE 5.65* 6.30* 6.16* 3.66* 4.19*  CALCIUM 8.2* 8.4* 8.4* 8.4* 8.6*  PHOS 5.8* 5.6* 5.7* 4.7* 5.2*    Liver Function Tests: Recent Labs  Lab 02/27/18 0539 02/28/18 0644 03/01/18 0758 03/02/18 0500  03/03/18 0553  ALBUMIN 1.8* 1.9* 2.1* 2.0* 2.0*    CBC: Recent Labs  Lab 02/25/18 0500 02/26/18 0430  02/27/18 0539 02/28/18 0644 03/01/18 0758 03/02/18 0500 03/03/18 0553  WBC 13.0* 11.4*  --  10.5 10.1 8.6  --   --   HGB 7.2* 6.8*   < > 7.6* 7.6* 7.7* 7.7* 8.7*  HCT 22.4* 22.1*   < > 24.5* 23.7* 24.3* 24.3* 28.3*  MCV 94.1 96.9  --  94.2 92.2 92.4  --   --   PLT PLATELET CLUMPS NOTED ON SMEAR, COUNT APPEARS DECREASED 150  --  PLATELET CLUMPS NOTED ON SMEAR, UNABLE TO ESTIMATE PLATELET CLUMPS NOTED ON SMEAR, UNABLE TO ESTIMATE 175  --   --    < > = values in this interval not displayed.    Medications:   . sodium chloride Stopped (02/21/18 1536)   . bacitracin   Topical BID  . Chlorhexidine Gluconate Cloth  6 each Topical Q0600  . darbepoetin (ARANESP) injection - NON-DIALYSIS  100 mcg Subcutaneous Q Tue-1800  . guaiFENesin  10 mL Per Tube TID  . heparin injection (subcutaneous)  5,000 Units Subcutaneous Q8H  . Influenza vac split quadrivalent PF  0.5 mL Intramuscular Tomorrow-1000  . pantoprazole  40 mg Oral Daily  . sodium chloride flush  10-40 mL Intracatheter Q12H   sodium chloride, oxyCODONE **AND** acetaminophen, acetaminophen, levalbuterol, [DISCONTINUED] ondansetron **  OR** ondansetron (ZOFRAN) IV, sodium chloride flush, traZODone  Assessment/ Plan:   Acute kidney injury in setting of ATN recent history of nonsteroidal anti-inflammatory drugs and IV contrast was started on CRRT 02/12/2018 to 02/17/2018 and started on intermittent hemodialysis 02/19/2018, 02/20/2018, 02/23/2018 and 02/25/2018.  Vas-Cath is currently in place - No acute need for HD today  - Lift NPO order for now  - Plan for normal saline today x 1 liter  - NPO after midnight should ash split be placed tomorrow (12/31); reassess per AM labs - Optimize anemia as below  Anemia on darbepoetin.  Improved s/p PRBC's  Acute hypoxic respiratory failure secondary to Legionella ARDS with hx of septic  shock.  S/p Levaquin   Encephalopathy improved.  Conversant   Atrial fibrillation with rapid ventricular rate anticoagulation deferred.  Now in sinus rhythm   LOS: 22 Estanislado EmmsLori C Greysen Devino 03/03/2018  8:46 AM

## 2018-03-03 NOTE — Progress Notes (Signed)
PROGRESS NOTE    Roberto Knapp  ZHY:[id number] DOB: 28-Sep-1970 DOA: 02/09/2018 PCP: Deatra JamesSun, Vyvyan, MD   Brief Narrative:  HPI On 02/09/2018 by Dr. Odie Seraimothy Opyd Roberto BussingGary W Stefanko is a 47 y.o. male who denies any significant past medical history, now presenting to the emergency department with approximately 10 days of fevers, generalized aches, abdominal pain, nausea, vomiting, diarrhea, cough, and shortness of breath.  Symptoms began with fevers, generalized aches, abdominal discomfort, and nausea with nonbloody vomiting, and diarrhea, but have progressed to include worsening shortness of breath, productive cough, melena, and then bright red blood per rectum.  Patient had been taking Advil every 4 hours for his symptoms, developed melena after few days, but reports more recent bright red blood in his stool.  Abdominal pain is mainly epigastric.  Denies chest pain.  Denies leg swelling or tenderness.  No headache, change in vision or hearing, or focal numbness or weakness.  He has never experienced these symptoms previously.  No recent travel or sick contacts.  Reports drinking 2-3 beers daily, denies illicit drug use, and denies ever experiencing withdrawal symptoms.  Interim history Admitted with sepsis secondary to multifocal pneumonia.  Found to also have acute hypoxic respiratory failure as well as acute kidney injury, gastritis with anemia. Also developed AKI and required CRRT, nephrology following.  Last HD treatment was on 03/01/2018.  Now pending CIR and further nephrology recommendations.  Assessment & Plan   Acute hypoxic respiratory failure  -Patient noted to have hypoxia on admission, oxygen saturations at rest 87% -secondary to Legionella pneumonia, ARDS  -Influenza PCR negative -Continue to treat underlying condition -appears to be improving - currently on room air  Septic shock secondary to Legionella pneumonia and ARDS -Patient presented with fever, tachycardia, tachypnea,  hypoxia -Initially placed on azithromycin, ceftriaxone followed by Zosyn and vancomycin, eventually transitioned to IV Levaquin and completed course of antibiotics -Patient required IV pressors and was admitted to the ICU.  Was also placed on IV stress dose steroids while in the ICU which have been discontinued -Blood pressure does appear to be stable -Appears patient had hypoxia that required pronating position  Acute toxic and metabolic encephalopathy -Altered factorial including ICU/hospital induced delirium, uremia, hypoxic injury, medications, sepsis -Speech therapy consulted for cognitive and swallowing evaluation -Resolved, appears to be AAOx3  Acute kidney injury with mixed anion gap and non-anion gap metabolic and respiratory acidosis -Nephrology was consulted and appreciated -Secondary to prerenal etiology, ATN and hemodynamic disturbances -Patient did require CRRT in the ICU -Currently requiring intermittent hemodialysis as per nephrology -Last hemodialysis 03/01/2018, creatinine currently down to 4.19 -Continue follow BMP -Discussed with nephrology today, recommending tunneled dialysis catheter be placed, hopefully on 03/04/2018.  Dysphagia -Speech therapy consulted- s/p MBS, placed on dysphagia 3 diet -Cortrack removed  Normocytic Anemia/GI bleeding -Anemia likely multifactorial due to acute blood loss secondary to GI bleeding, critical illness, sepsis and renal disease -On admission, patient reported epigastric pain with melena after taking Advil every 4 hours for fever and aches. -FOBT positive -Continue PPI twice daily -Patient does not appear to have any active GI bleeding, GI was not consulted -hemoglobin stable, 8.7 -Continue to monitor CBC  Atrial fibrillation with RVR -Appears paroxysmal, currently patient in sinus rhythm -Was on amiodarone for rate control -Anticoagulation was deferred secondary to GI bleed -Will likely not need anticoagulation moving  forward given that patient does not appear to have further episodes of atrial fibrillation  Elevated LFTs -Secondary to sepsis vs medications vs ?alcohol  use -Upon review of chart, appears to have trended downward and currently normalized  Bilateral leg pain -CK normal.  Etiology unclear -Continue symptomatic treatment, pain control  Diarrhea -Suspect secondary to tube feeding and antibiotic use -Currently no leukocytosis, no abdominal distention nausea or vomiting -Does not appear to be C. difficile, patient was placed on Imodium -Abdominal x-ray showed no acute abnormality  Left upper extremity pain/upper extremity basilic SVT -Mild edema noted -Currently not at risk for further worsening of DVT.  Given bleeding risk anticoagulation held -Treat with warm compresses  Pressure injury -Unstageable, on penis. -Continue wound care -not present on admission  Deconditioning -PT rec CIR -pending OR consult -Inpatient rehab consulted- patient potential candidate  DVT Prophylaxis  SCDs  Code Status: Full  Family Communication: None at bedside  Disposition Plan: Admitted.  Dispo TBD.  Pending inpatient rehab.  Also pending further recommendations from nephrology regarding possible need for further HD.  Consultants P[facility] Nephrology Inpatient rehab  Procedures  Intubation extubation, 12/9 -12/18 Left IJ CVL Right IJ HD cath  Antibiotics   Anti-infectives (From admission, onward)   Start     Dose/Rate Route Frequency Ordered Stop   02/19/18 1000  levofloxacin (LEVAQUIN) IVPB 500 mg     500 mg 100 mL/hr over 60 Minutes Intravenous Every 48 hours 02/17/18 1106 02/23/18 1859   02/14/18 0900  Levofloxacin (LEVAQUIN) IVPB 250 mg  Status:  Discontinued     250 mg 50 mL/hr over 60 Minutes Intravenous Every 24 hours 02/13/18 0815 02/17/18 1106   02/13/18 0830  levofloxacin (LEVAQUIN) IVPB 500 mg     500 mg 100 mL/hr over 60 Minutes Intravenous  Once 02/13/18 0815 02/13/18 1150    02/12/18 2000  vancomycin (VANCOCIN) 1,500 mg in sodium chloride 0.9 % 500 mL IVPB  Status:  Discontinued     1,500 mg 250 mL/hr over 120 Minutes Intravenous Every 24 hours 02/11/18 1406 02/11/18 1521   02/12/18 2000  piperacillin-tazobactam (ZOSYN) IVPB 3.375 g  Status:  Discontinued     3.375 g 100 mL/hr over 30 Minutes Intravenous Every 6 hours 02/12/18 1408 02/13/18 0815   02/10/18 2000  vancomycin (VANCOCIN) 1,250 mg in sodium chloride 0.9 % 250 mL IVPB  Status:  Discontinued     1,250 mg 166.7 mL/hr over 90 Minutes Intravenous Every 24 hours 02/09/18 1839 02/10/18 0728   02/10/18 1530  vancomycin (VANCOCIN) IVPB 750 mg/150 ml premix  Status:  Discontinued     750 mg 150 mL/hr over 60 Minutes Intravenous Every 12 hours 02/10/18 1432 02/11/18 1406   02/10/18 1400  piperacillin-tazobactam (ZOSYN) IVPB 3.375 g  Status:  Discontinued     3.375 g 12.5 mL/hr over 240 Minutes Intravenous Every 8 hours 02/10/18 0731 02/12/18 1408   02/10/18 0745  piperacillin-tazobactam (ZOSYN) IVPB 3.375 g     3.375 g 100 mL/hr over 30 Minutes Intravenous  Once 02/10/18 0728 02/10/18 0921   02/10/18 0600  ceFEPIme (MAXIPIME) 2 g in sodium chloride 0.9 % 100 mL IVPB  Status:  Discontinued     2 g 200 mL/hr over 30 Minutes Intravenous Every 12 hours 02/09/18 1839 02/09/18 2014   02/10/18 0200  cefTRIAXone (ROCEPHIN) 1 g in sodium chloride 0.9 % 100 mL IVPB  Status:  Discontinued     1 g 200 mL/hr over 30 Minutes Intravenous Every 24 hours 02/09/18 2014 02/10/18 0728   02/09/18 2015  azithromycin (ZITHROMAX) 500 mg in sodium chloride 0.9 % 250 mL IVPB  Status:  Discontinued     500 mg 250 mL/hr over 60 Minutes Intravenous Every 24 hours 02/09/18 2014 02/12/18 1306   02/09/18 1830  vancomycin (VANCOCIN) 1,250 mg in sodium chloride 0.9 % 250 mL IVPB     1,250 mg 166.7 mL/hr over 90 Minutes Intravenous  Once 02/09/18 1800 02/09/18 2127   02/09/18 1800  ceFEPIme (MAXIPIME) 2 g in sodium chloride 0.9 % 100 mL  IVPB     2 g 200 mL/hr over 30 Minutes Intravenous  Once 02/09/18 1745 02/09/18 1925   02/09/18 1800  metroNIDAZOLE (FLAGYL) IVPB 500 mg  Status:  Discontinued     500 mg 100 mL/hr over 60 Minutes Intravenous Every 8 hours 02/09/18 1745 02/10/18 0728   02/09/18 1800  vancomycin (VANCOCIN) IVPB 1000 mg/200 mL premix  Status:  Discontinued     1,000 mg 200 mL/hr over 60 Minutes Intravenous  Once 02/09/18 1745 02/09/18 1800      Subjective:   [name] seen and examined today.  Patient has no complaints.  Feeling better today.  Denies current chest pain, shortness breath, abdominal pain, nausea or vomiting, diarrhea constipation, dizziness or headache.  Objective:   Vitals:   03/02/18 1822 03/02/18 1836 03/02/18 2114 03/03/18 0800  BP: 129/82 121/89 131/90 124/82  Pulse: 82 80 86 81  Resp: 16 16 16 15   Temp: 98.3 F (36.8 C) 98.2 F (36.8 C) 98.1 F (36.7 C) 98.4 F (36.9 C)  TempSrc: Oral Oral Oral Oral  SpO2: 100% 100% 99% 98%  Weight:      Height:        Intake/Output Summary (Last 24 hours) at 03/03/2018 1114 Last data filed at 03/03/2018 0659 Gross per 24 hour  Intake 308 ml  Output 2485 ml  Net -2177 ml   Filed Weights   02/28/18 0650 03/01/18 1255 03/01/18 1613  Weight: 62.6 kg 61 kg 61 kg   Exam  General: Well developed, well nourished, NAD, appears stated age  HEENT: NCAT, mucous membranes moist.   Neck: Supple  Cardiovascular: S1 S2 auscultated, no murmur, RRR  Respiratory: Clear to auscultation bilaterally with equal chest rise  Abdomen: Soft, nontender, nondistended, + bowel sounds  Extremities: warm dry without cyanosis clubbing or edema  Neuro: AAOx3, nonfocal  Psych: Appropriate mood and affect, pleasant  Data Reviewed: I have personally reviewed following labs and imaging studies  CBC: Recent Labs  Lab 02/25/18 0500 02/26/18 0430  02/27/18 0539 02/28/18 0644 03/01/18 0758 03/02/18 0500 03/03/18 0553  WBC 13.0* 11.4*  --  10.5  10.1 8.6  --   --   HGB 7.2* 6.8*   < > 7.6* 7.6* 7.7* 7.7* 8.7*  HCT 22.4* 22.1*   < > 24.5* 23.7* 24.3* 24.3* 28.3*  MCV 94.1 96.9  --  94.2 92.2 92.4  --   --   PLT PLATELET CLUMPS NOTED ON SMEAR, COUNT APPEARS DECREASED 150  --  PLATELET CLUMPS NOTED ON SMEAR, UNABLE TO ESTIMATE PLATELET CLUMPS NOTED ON SMEAR, UNABLE TO ESTIMATE 175  --   --    < > = values in this interval not displayed.   Basic Metabolic Panel: Recent Labs  Lab 02/27/18 0539 02/28/18 0644 03/01/18 0758 03/02/18 0500 03/03/18 0553  NA 132* 132* 133* 137 137  K 3.8 3.8 3.7 3.8 4.1  CL 93* 93* 95* 100 103  CO2 28 26 24 27 23   GLUCOSE 105* 99 97 97 98  BUN 46* 61* 63* 29* 45*  CREATININE 5.65* 6.30*  6.16* 3.66* 4.19*  CALCIUM 8.2* 8.4* 8.4* 8.4* 8.6*  PHOS 5.8* 5.6* 5.7* 4.7* 5.2*   GFR: Estimated Creatinine Clearance: 18.4 mL/min (A) (by C-G formula based on SCr of 4.19 mg/dL (H)). Liver Function Tests: Recent Labs  Lab 02/27/18 0539 02/28/18 0644 03/01/18 0758 03/02/18 0500 03/03/18 0553  ALBUMIN 1.8* 1.9* 2.1* 2.0* 2.0*   No results for input(s): LIPASE, AMYLASE in the last 168 hours. No results for input(s): AMMONIA in the last 168 hours. Coagulation Profile: No results for input(s): INR, PROTIME in the last 168 hours. Cardiac Enzymes: No results for input(s): CKTOTAL, CKMB, CKMBINDEX, TROPONINI in the last 168 hours. BNP (last 3 results) No results for input(s): PROBNP in the last 8760 hours. HbA1C: No results for input(s): HGBA1C in the last 72 hours. CBG: Recent Labs  Lab 02/27/18 1947 02/27/18 2317 02/28/18 0742 02/28/18 1252 03/01/18 0743  GLUCAP 101* 95 103* 116* 88   Lipid Profile: No results for input(s): CHOL, HDL, LDLCALC, TRIG, CHOLHDL, LDLDIRECT in the last 72 hours. Thyroid Function Tests: No results for input(s): TSH, T4TOTAL, FREET4, T3FREE, THYROIDAB in the last 72 hours. Anemia Panel: No results for input(s): VITAMINB12, FOLATE, FERRITIN, TIBC, IRON, RETICCTPCT in  the last 72 hours. Urine analysis:    Component Value Date/Time   COLORURINE AMBER (A) 02/09/2018 1816   APPEARANCEUR CLEAR 02/09/2018 1816   LABSPEC 1.019 02/09/2018 1816   PHURINE 6.0 02/09/2018 1816   GLUCOSEU NEGATIVE 02/09/2018 1816   HGBUR SMALL (A) 02/09/2018 1816   BILIRUBINUR NEGATIVE 02/09/2018 1816   KETONESUR NEGATIVE 02/09/2018 1816   PROTEINUR 100 (A) 02/09/2018 1816   UROBILINOGEN 1.0 10/21/2014 0951   NITRITE NEGATIVE 02/09/2018 1816   LEUKOCYTESUR NEGATIVE 02/09/2018 1816   Sepsis Labs: @LABRCNTIP (procalcitonin:4,lacticidven:4)  )No results found for this or any previous visit (from the past 240 hour(s)).    Radiology Studies: No results found.   Scheduled Meds: . bacitracin   Topical BID  . Chlorhexidine Gluconate Cloth  6 each Topical Q0600  . darbepoetin (ARANESP) injection - NON-DIALYSIS  100 mcg Subcutaneous Q Tue-1800  . guaiFENesin  10 mL Oral TID  . heparin injection (subcutaneous)  5,000 Units Subcutaneous Q8H  . Influenza vac split quadrivalent PF  0.5 mL Intramuscular Tomorrow-1000  . pantoprazole  40 mg Oral Daily  . sodium chloride flush  10-40 mL Intracatheter Q12H   Continuous Infusions: . sodium chloride Stopped (02/21/18 1536)  . sodium chloride 75 mL/hr at 03/03/18 1036     LOS: 22 days   Time Spent in minutes   30 minutes  Evetta Renner D.O. on 03/03/2018 at 11:14 AM  Between 7am to 7pm - Please see pager noted on [email or web address]  After 7pm go to [email or web address]  And look for the night coverage person covering for me after hours  [facility] Office  [phone]

## 2018-03-03 NOTE — Progress Notes (Signed)
SLP Cancellation Note  Patient Details Name: Roberto Knapp MRN: [id number] DOB: 1970-12-08   Cancelled treatment:       Reason Eval/Treat Not Completed: Other (comment) Per chart review, pt is NPO this morning pending possible procedure. Will f/u after he is able to resume POs.   Roberto Knapp, Roberto Knapp 03/03/2018, 8:47 AM  Roberto Knapp, M.A. CCC-SLP Acute Herbalistehabilitation Services Pager [phone] Office [phone]

## 2018-03-04 LAB — RENAL FUNCTION PANEL
Albumin: 2.1 g/dL — ABNORMAL LOW (ref 3.5–5.0)
Anion gap: 10 (ref 5–15)
BUN: 46 mg/dL — ABNORMAL HIGH (ref 6–20)
CO2: 21 mmol/L — ABNORMAL LOW (ref 22–32)
Calcium: 8.6 mg/dL — ABNORMAL LOW (ref 8.9–10.3)
Chloride: 109 mmol/L (ref 98–111)
Creatinine, Ser: 4.27 mg/dL — ABNORMAL HIGH (ref 0.61–1.24)
GFR calc Af Amer: 18 mL/min — ABNORMAL LOW (ref >60)
GFR calc non Af Amer: 15 mL/min — ABNORMAL LOW (ref >60)
Glucose, Bld: 95 mg/dL (ref 70–99)
Phosphorus: 5.6 mg/dL — ABNORMAL HIGH (ref 2.5–4.6)
Potassium: 4.4 mmol/L (ref 3.5–5.1)
Sodium: 140 mmol/L (ref 135–145)

## 2018-03-04 LAB — CBC
HCT: 28.9 % — ABNORMAL LOW (ref 39.0–52.0)
Hemoglobin: 8.8 g/dL — ABNORMAL LOW (ref 13.0–17.0)
MCH: 28.9 pg (ref 26.0–34.0)
MCHC: 30.4 g/dL (ref 30.0–36.0)
MCV: 94.8 fL (ref 80.0–100.0)
Platelets: 307 10*3/uL (ref 150–400)
RBC: 3.05 MIL/uL — ABNORMAL LOW (ref 4.22–5.81)
RDW: 15.2 % (ref 11.5–15.5)
WBC: 11 10*3/uL — ABNORMAL HIGH (ref 4.0–10.5)
nRBC: 0 % (ref 0.0–0.2)

## 2018-03-04 LAB — HEMOGLOBIN AND HEMATOCRIT, BLOOD
HCT: 27.9 % — ABNORMAL LOW (ref 39.0–52.0)
Hemoglobin: 8.8 g/dL — ABNORMAL LOW (ref 13.0–17.0)

## 2018-03-04 MED ORDER — OXYCODONE HCL 5 MG PO TABS
5.0000 mg | ORAL_TABLET | Freq: Four times a day (QID) | ORAL | Status: DC | PRN
  Administered 2018-03-04: 5 mg via ORAL
  Filled 2018-03-04: qty 1

## 2018-03-04 MED ORDER — ACETAMINOPHEN 325 MG PO TABS: 325.0000 mg | ORAL_TABLET | Freq: Four times a day (QID) | ORAL | Status: DC | PRN

## 2018-03-04 MED ORDER — CHLORHEXIDINE GLUCONATE CLOTH 2 % EX PADS
6.0000 | MEDICATED_PAD | Freq: Every day | CUTANEOUS | Status: DC
  Administered 2018-03-04 – 2018-03-06 (×3): 6 via TOPICAL

## 2018-03-04 MED ORDER — SODIUM CHLORIDE 0.9 % IV SOLN
INTRAVENOUS | Status: AC
  Administered 2018-03-04: 11:00:00 via INTRAVENOUS

## 2018-03-04 MED ORDER — HEPARIN SODIUM (PORCINE) 1000 UNIT/ML IJ SOLN
INTRAMUSCULAR | Status: AC
  Administered 2018-03-04: 2400 [IU]
  Filled 2018-03-04: qty 3

## 2018-03-04 NOTE — Progress Notes (Signed)
PT Cancellation Note  Patient Details Name: Nolon BussingGary W Lecrone MRN: [id number] DOB: [birth year]   Cancelled Treatment:    Reason Eval/Treat Not Completed: Patient at procedure or test/unavailable(Pt being transported to HD.) PT will continue to follow acutely.   Erline LevineKellyn Glennys Schorsch, PTA Acute Rehabilitation Services Pager: [phone] Office: [phone]   03/04/2018, 11:45 AM

## 2018-03-04 NOTE — Progress Notes (Signed)
OT Cancellation Note  Patient Details Name: Roberto BussingGary W Hardt MRN: [id number] DOB: March 22, 1970   Cancelled Treatment:    Reason Eval/Treat Not Completed: Patient at procedure or test/ unavailable.  Pt in HD.  Jeani HawkingWendi Sirinity Outland, OTR/L Acute Rehabilitation Services Pager [phone] Office [phone]   Jeani HawkingConarpe, Sherlene Rickel M 03/04/2018, 12:01 PM

## 2018-03-04 NOTE — Progress Notes (Signed)
SLP Cancellation Note  Patient Details Name: Nolon BussingGary W Lupien MRN: [id number] DOB: 01-11-1971   Cancelled treatment:       Reason Eval/Treat Not Completed: Patient at procedure or test/unavailable (HD).   Maxcine Hamaiewonsky, Maguire Sime 03/04/2018, 4:13 PM  Maxcine HamLaura Paiewonsky, M.A. CCC-SLP Acute Herbalistehabilitation Services Pager [phone] Office [phone]

## 2018-03-04 NOTE — Progress Notes (Signed)
[facility] ROUNDING NOTE   Subjective:   Last HD treatment was 12/28 for clearance.  Pt had 2.4 liters UOP over 12/30.  Spoke with his wife yesterday evening.  He feels good this AM and had no trouble with the fluids.  He is ok with plan for HD today.  Spoke with primary team and RN accompanied me on my rounds.  Review of systems:   Denies shortness of breath or cough  Denies chest pain Denies nausea or vomiting    Objective:  Vital signs in last 24 hours:  Temp:  [98.1 F (36.7 C)-98.7 F (37.1 C)] 98.1 F (36.7 C) (12/31 0723) Pulse Rate:  [71-86] 84 (12/31 0723) Resp:  [12-16] 12 (12/31 0723) BP: (112-133)/(67-88) 112/67 (12/31 0723) SpO2:  [97 %-99 %] 97 % (12/31 0723) Weight:  [60.4 kg] 60.4 kg (12/31 0800)  Weight change:  Filed Weights   03/01/18 1255 03/01/18 1613 03/04/18 0800  Weight: 61 kg 61 kg 60.4 kg    Intake/Output: I/O last 3 completed shifts: In: 2308 [P.O.:1000; I.V.:1000; Blood:308] Out: 4810 [Urine:4810]   Intake/Output this shift:  Total I/O In: 3 [I.V.:3] Out: -   General adult male in bed in no acute distress HEENT normocephalic atraumatic extraocular movements intact sclera anicteric Neck supple trachea midline Lungs clear and unlabored Heart regular rate and rhythm no rubs or gallops appreciated Abdomen soft nontender nondistended Extremities no lower extremity edema  Psych normal mood and affect Access: RIJ vascath in place   Basic Metabolic Panel: Recent Labs  Lab 02/28/18 0644 03/01/18 0758 03/02/18 0500 03/03/18 0553 03/04/18 0500  NA 132* 133* 137 137 140  K 3.8 3.7 3.8 4.1 4.4  CL 93* 95* 100 103 109  CO2 26 24 27 23  21*  GLUCOSE 99 97 97 98 95  BUN 61* 63* 29* 45* 46*  CREATININE 6.30* 6.16* 3.66* 4.19* 4.27*  CALCIUM 8.4* 8.4* 8.4* 8.6* 8.6*  PHOS 5.6* 5.7* 4.7* 5.2* 5.6*    Liver Function Tests: Recent Labs  Lab 02/28/18 0644 03/01/18 0758 03/02/18 0500 03/03/18 0553 03/04/18 0500   ALBUMIN 1.9* 2.1* 2.0* 2.0* 2.1*    CBC: Recent Labs  Lab 02/26/18 0430  02/27/18 0539 02/28/18 0644 03/01/18 0758 03/02/18 0500 03/03/18 0553 03/04/18 0500  WBC 11.4*  --  10.5 10.1 8.6  --   --   --   HGB 6.8*   < > 7.6* 7.6* 7.7* 7.7* 8.7* 8.8*  HCT 22.1*   < > 24.5* 23.7* 24.3* 24.3* 28.3* 27.9*  MCV 96.9  --  94.2 92.2 92.4  --   --   --   PLT 150  --  PLATELET CLUMPS NOTED ON SMEAR, UNABLE TO ESTIMATE PLATELET CLUMPS NOTED ON SMEAR, UNABLE TO ESTIMATE 175  --   --   --    < > = values in this interval not displayed.    Medications:   . sodium chloride Stopped (02/21/18 1536)   . bacitracin   Topical BID  . Chlorhexidine Gluconate Cloth  6 each Topical Q0600  . darbepoetin (ARANESP) injection - NON-DIALYSIS  100 mcg Subcutaneous Q Tue-1800  . guaiFENesin  10 mL Oral TID  . heparin injection (subcutaneous)  5,000 Units Subcutaneous Q8H  . Influenza vac split quadrivalent PF  0.5 mL Intramuscular Tomorrow-1000  . pantoprazole  40 mg Oral Daily  . sodium chloride flush  10-40 mL Intracatheter Q12H   sodium chloride, oxyCODONE **AND** acetaminophen, acetaminophen, levalbuterol, [DISCONTINUED] ondansetron **OR** ondansetron (ZOFRAN)  IV, sodium chloride flush, traZODone  Assessment/ Plan:   Acute kidney injury in setting of ATN recent history of nonsteroidal anti-inflammatory drugs and IV contrast was started on CRRT 02/12/2018 to 02/17/2018 and started on intermittent hemodialysis 02/19/2018, 02/20/2018, 02/23/2018 and 02/25/2018.   - Vas-Cath is currently in place - RN to change dressing today - Given only marginal intra-dialytic rise in Cr, will plan for HD today for clearance then hope to possibly hold HD thereafter - Redose normal saline today x 1 liter at 75 ml/hr - Continue foley catheter for now - plan to remove soon  - Optimize anemia as below  Anemia on darbepoetin.  Improved s/p PRBC's  Acute hypoxic respiratory failure secondary to Legionella ARDS with hx  of septic shock.  Now on room air.  S/p Levaquin   Encephalopathy improved.  Conversant though is sometimes confused   Hx Atrial fibrillation with rapid ventricular rate.  Now in sinus rhythm   LOS: 23 Estanislado EmmsLori C Foster 03/04/2018  9:24 AM

## 2018-03-04 NOTE — Progress Notes (Signed)
PROGRESS NOTE    Roberto Knapp  ZOX:[id number] DOB: April 19, 1970 DOA: 02/09/2018 PCP: [name], MD   Brief Narrative:  HPI On 02/09/2018 by Dr. [name] Roberto Knapp is a 47 y.o. male who denies any significant past medical history, now presenting to the emergency department with approximately 10 days of fevers, generalized aches, abdominal pain, nausea, vomiting, diarrhea, cough, and shortness of breath.  Symptoms began with fevers, generalized aches, abdominal discomfort, and nausea with nonbloody vomiting, and diarrhea, but have progressed to include worsening shortness of breath, productive cough, melena, and then bright red blood per rectum.  Patient had been taking Advil every 4 hours for his symptoms, developed melena after few days, but reports more recent bright red blood in his stool.  Abdominal pain is mainly epigastric.  Denies chest pain.  Denies leg swelling or tenderness.  No headache, change in vision or hearing, or focal numbness or weakness.  He has never experienced these symptoms previously.  No recent travel or sick contacts.  Reports drinking 2-3 beers daily, denies illicit drug use, and denies ever experiencing withdrawal symptoms.  Interim history Admitted with sepsis secondary to multifocal pneumonia.  Found to also have acute hypoxic respiratory failure as well as acute kidney injury, gastritis with anemia. Also developed AKI and required CRRT, nephrology following.  Last HD treatment was on 03/01/2018.  Now pending CIR and further nephrology recommendations.  Assessment & Plan   Acute hypoxic respiratory failure  -Patient noted to have hypoxia on admission, oxygen saturations at rest 87% -secondary to Legionella pneumonia, ARDS  -Influenza PCR negative -Continue to treat underlying condition -appears to be improving - currently on room air  Septic shock secondary to Legionella pneumonia and ARDS -Patient presented with fever, tachycardia, tachypnea,  hypoxia -Initially placed on azithromycin, ceftriaxone followed by Zosyn and vancomycin, eventually transitioned to IV Levaquin and completed course of antibiotics -Patient required IV pressors and was admitted to the ICU.  Was also placed on IV stress dose steroids while in the ICU which have been discontinued -Blood pressure does appear to be stable -Appears patient had hypoxia that required pronating position  Acute toxic and metabolic encephalopathy -Altered factorial including ICU/hospital induced delirium, uremia, hypoxic injury, medications, sepsis -Speech therapy consulted for cognitive and swallowing evaluation -Resolved, appears to be AAOx3  Acute kidney injury with mixed anion gap and non-anion gap metabolic and respiratory acidosis -Nephrology was consulted and appreciated -Secondary to prerenal etiology, ATN and hemodynamic disturbances -Patient did require CRRT in the ICU -Currently requiring intermittent hemodialysis as per nephrology -Last hemodialysis 03/01/2018, creatinine currently 4.27 -Continue follow BMP -Had long discussion with nephrology today, Dr. Malen Gauze, decided against tunneled catheter placement.  We will plan for HD today and possibly hold HD thereafter in hopes that patient's renal function will improve further.  Patient is making a very good amount of urine.  Dysphagia -Speech therapy consulted- s/p MBS, placed on dysphagia 3 diet -Cortrack removed  Normocytic Anemia/GI bleeding -Anemia likely multifactorial due to acute blood loss secondary to GI bleeding, critical illness, sepsis and renal disease -On admission, patient reported epigastric pain with melena after taking Advil every 4 hours for fever and aches. -FOBT positive -Continue PPI twice daily -Patient does not appear to have any active GI bleeding, GI was not consulted -hemoglobin stable, 8.8 -Continue to monitor CBC  Atrial fibrillation with RVR -Appears paroxysmal, currently patient in  sinus rhythm -Was on amiodarone for rate control -Anticoagulation was deferred secondary to GI bleed -Will  likely not need anticoagulation moving forward given that patient does not appear to have further episodes of atrial fibrillation  Elevated LFTs -Secondary to sepsis vs medications vs ?alcohol use -Upon review of chart, appears to have trended downward and currently normalized  Bilateral leg pain -CK normal.  Etiology unclear -Continue symptomatic treatment, pain control  Diarrhea -Suspect secondary to tube feeding and antibiotic use -Currently no leukocytosis, no abdominal distention nausea or vomiting -Does not appear to be C. difficile, patient was placed on Imodium -Abdominal x-ray showed no acute abnormality  Left upper extremity pain/upper extremity basilic SVT -Mild edema noted -Currently not at risk for further worsening of DVT.  Given bleeding risk anticoagulation held -Treat with warm compresses  Pressure injury -Unstageable, on penis. -Continue wound care -not present on admission  Deconditioning -PT rec CIR -Inpatient rehab consulted- patient potential candidate-however awaiting for PT and OT to reassess patient.  DVT Prophylaxis  SCDs  Code Status: Full  Family Communication: None at bedside  Disposition Plan: Admitted.  Dispo TBD.  Pending inpatient rehab versus possibility of home with home health.  Pending further recommendations regarding HD from nephrology.  Consultants P[facility] Nephrology Inpatient rehab  Procedures  Intubation extubation, 12/9 -12/18 Left IJ CVL Right IJ HD cath  Antibiotics   Anti-infectives (From admission, onward)   Start     Dose/Rate Route Frequency Ordered Stop   02/19/18 1000  levofloxacin (LEVAQUIN) IVPB 500 mg     500 mg 100 mL/hr over 60 Minutes Intravenous Every 48 hours 02/17/18 1106 02/23/18 1859   02/14/18 0900  Levofloxacin (LEVAQUIN) IVPB 250 mg  Status:  Discontinued     250 mg 50 mL/hr over 60 Minutes  Intravenous Every 24 hours 02/13/18 0815 02/17/18 1106   02/13/18 0830  levofloxacin (LEVAQUIN) IVPB 500 mg     500 mg 100 mL/hr over 60 Minutes Intravenous  Once 02/13/18 0815 02/13/18 1150   02/12/18 2000  vancomycin (VANCOCIN) 1,500 mg in sodium chloride 0.9 % 500 mL IVPB  Status:  Discontinued     1,500 mg 250 mL/hr over 120 Minutes Intravenous Every 24 hours 02/11/18 1406 02/11/18 1521   02/12/18 2000  piperacillin-tazobactam (ZOSYN) IVPB 3.375 g  Status:  Discontinued     3.375 g 100 mL/hr over 30 Minutes Intravenous Every 6 hours 02/12/18 1408 02/13/18 0815   02/10/18 2000  vancomycin (VANCOCIN) 1,250 mg in sodium chloride 0.9 % 250 mL IVPB  Status:  Discontinued     1,250 mg 166.7 mL/hr over 90 Minutes Intravenous Every 24 hours 02/09/18 1839 02/10/18 0728   02/10/18 1530  vancomycin (VANCOCIN) IVPB 750 mg/150 ml premix  Status:  Discontinued     750 mg 150 mL/hr over 60 Minutes Intravenous Every 12 hours 02/10/18 1432 02/11/18 1406   02/10/18 1400  piperacillin-tazobactam (ZOSYN) IVPB 3.375 g  Status:  Discontinued     3.375 g 12.5 mL/hr over 240 Minutes Intravenous Every 8 hours 02/10/18 0731 02/12/18 1408   02/10/18 0745  piperacillin-tazobactam (ZOSYN) IVPB 3.375 g     3.375 g 100 mL/hr over 30 Minutes Intravenous  Once 02/10/18 0728 02/10/18 0921   02/10/18 0600  ceFEPIme (MAXIPIME) 2 g in sodium chloride 0.9 % 100 mL IVPB  Status:  Discontinued     2 g 200 mL/hr over 30 Minutes Intravenous Every 12 hours 02/09/18 1839 02/09/18 2014   02/10/18 0200  cefTRIAXone (ROCEPHIN) 1 g in sodium chloride 0.9 % 100 mL IVPB  Status:  Discontinued  1 g 200 mL/hr over 30 Minutes Intravenous Every 24 hours 02/09/18 2014 02/10/18 0728   02/09/18 2015  azithromycin (ZITHROMAX) 500 mg in sodium chloride 0.9 % 250 mL IVPB  Status:  Discontinued     500 mg 250 mL/hr over 60 Minutes Intravenous Every 24 hours 02/09/18 2014 02/12/18 1306   02/09/18 1830  vancomycin (VANCOCIN) 1,250 mg in  sodium chloride 0.9 % 250 mL IVPB     1,250 mg 166.7 mL/hr over 90 Minutes Intravenous  Once 02/09/18 1800 02/09/18 2127   02/09/18 1800  ceFEPIme (MAXIPIME) 2 g in sodium chloride 0.9 % 100 mL IVPB     2 g 200 mL/hr over 30 Minutes Intravenous  Once 02/09/18 1745 02/09/18 1925   02/09/18 1800  metroNIDAZOLE (FLAGYL) IVPB 500 mg  Status:  Discontinued     500 mg 100 mL/hr over 60 Minutes Intravenous Every 8 hours 02/09/18 1745 02/10/18 0728   02/09/18 1800  vancomycin (VANCOCIN) IVPB 1000 mg/200 mL premix  Status:  Discontinued     1,000 mg 200 mL/hr over 60 Minutes Intravenous  Once 02/09/18 1745 02/09/18 1800      Subjective:   [name] seen and examined today.  Patient has no complaint today.  Feeling better.  Denies current chest pain, shortness breath, abdominal pain, nausea or vomiting, diarrhea or constipation, dizziness or headache. Objective:   Vitals:   03/03/18 1714 03/03/18 2347 03/04/18 0723 03/04/18 0800  BP: 133/88 115/78 112/67   Pulse: 71 86 84   Resp: 16 16 12    Temp: 98.7 F (37.1 C) 98.6 F (37 C) 98.1 F (36.7 C)   TempSrc: Oral Oral Oral   SpO2: 99% 99% 97%   Weight:    60.4 kg  Height:        Intake/Output Summary (Last 24 hours) at 03/04/2018 1109 Last data filed at 03/04/2018 0900 Gross per 24 hour  Intake 2003 ml  Output 2675 ml  Net -672 ml   Filed Weights   03/01/18 1255 03/01/18 1613 03/04/18 0800  Weight: 61 kg 61 kg 60.4 kg   Exam  General: Well developed, well nourished, NAD, appears stated age  HEENT: NCAT, mucous membranes moist.   Neck: Supple  Cardiovascular: S1 S2 auscultated, RRR, no murmur  Respiratory: Clear to auscultation bilaterally with equal chest rise  Abdomen: Soft, nontender, nondistended, + bowel sounds  Extremities: warm dry without cyanosis clubbing or edema  Neuro: AAOx3, nonfocal  Psych: Pleasant, appropriate mood and affect  Data Reviewed: I have personally reviewed following labs and imaging  studies  CBC: Recent Labs  Lab 02/26/18 0430  02/27/18 0539 02/28/18 0644 03/01/18 0758 03/02/18 0500 03/03/18 0553 03/04/18 0500  WBC 11.4*  --  10.5 10.1 8.6  --   --   --   HGB 6.8*   < > 7.6* 7.6* 7.7* 7.7* 8.7* 8.8*  HCT 22.1*   < > 24.5* 23.7* 24.3* 24.3* 28.3* 27.9*  MCV 96.9  --  94.2 92.2 92.4  --   --   --   PLT 150  --  PLATELET CLUMPS NOTED ON SMEAR, UNABLE TO ESTIMATE PLATELET CLUMPS NOTED ON SMEAR, UNABLE TO ESTIMATE 175  --   --   --    < > = values in this interval not displayed.   Basic Metabolic Panel: Recent Labs  Lab 02/28/18 0644 03/01/18 0758 03/02/18 0500 03/03/18 0553 03/04/18 0500  NA 132* 133* 137 137 140  K 3.8 3.7 3.8 4.1 4.4  CL 93* 95* 100 103 109  CO2 26 24 27 23  21*  GLUCOSE 99 97 97 98 95  BUN 61* 63* 29* 45* 46*  CREATININE 6.30* 6.16* 3.66* 4.19* 4.27*  CALCIUM 8.4* 8.4* 8.4* 8.6* 8.6*  PHOS 5.6* 5.7* 4.7* 5.2* 5.6*   GFR: Estimated Creatinine Clearance: 17.9 mL/min (A) (by C-G formula based on SCr of 4.27 mg/dL (H)). Liver Function Tests: Recent Labs  Lab 02/28/18 0644 03/01/18 0758 03/02/18 0500 03/03/18 0553 03/04/18 0500  ALBUMIN 1.9* 2.1* 2.0* 2.0* 2.1*   No results for input(s): LIPASE, AMYLASE in the last 168 hours. No results for input(s): AMMONIA in the last 168 hours. Coagulation Profile: No results for input(s): INR, PROTIME in the last 168 hours. Cardiac Enzymes: No results for input(s): CKTOTAL, CKMB, CKMBINDEX, TROPONINI in the last 168 hours. BNP (last 3 results) No results for input(s): PROBNP in the last 8760 hours. HbA1C: No results for input(s): HGBA1C in the last 72 hours. CBG: Recent Labs  Lab 02/27/18 1947 02/27/18 2317 02/28/18 0742 02/28/18 1252 03/01/18 0743  GLUCAP 101* 95 103* 116* 88   Lipid Profile: No results for input(s): CHOL, HDL, LDLCALC, TRIG, CHOLHDL, LDLDIRECT in the last 72 hours. Thyroid Function Tests: No results for input(s): TSH, T4TOTAL, FREET4, T3FREE, THYROIDAB in  the last 72 hours. Anemia Panel: No results for input(s): VITAMINB12, FOLATE, FERRITIN, TIBC, IRON, RETICCTPCT in the last 72 hours. Urine analysis:    Component Value Date/Time   COLORURINE AMBER (A) 02/09/2018 1816   APPEARANCEUR CLEAR 02/09/2018 1816   LABSPEC 1.019 02/09/2018 1816   PHURINE 6.0 02/09/2018 1816   GLUCOSEU NEGATIVE 02/09/2018 1816   HGBUR SMALL (A) 02/09/2018 1816   BILIRUBINUR NEGATIVE 02/09/2018 1816   KETONESUR NEGATIVE 02/09/2018 1816   PROTEINUR 100 (A) 02/09/2018 1816   UROBILINOGEN 1.0 10/21/2014 0951   NITRITE NEGATIVE 02/09/2018 1816   LEUKOCYTESUR NEGATIVE 02/09/2018 1816   Sepsis Labs: @LABRCNTIP (procalcitonin:4,lacticidven:4)  )No results found for this or any previous visit (from the past 240 hour(s)).    Radiology Studies: No results found.   Scheduled Meds: . bacitracin   Topical BID  . Chlorhexidine Gluconate Cloth  6 each Topical Q0600  . darbepoetin (ARANESP) injection - NON-DIALYSIS  100 mcg Subcutaneous Q Tue-1800  . guaiFENesin  10 mL Oral TID  . heparin injection (subcutaneous)  5,000 Units Subcutaneous Q8H  . Influenza vac split quadrivalent PF  0.5 mL Intramuscular Tomorrow-1000  . pantoprazole  40 mg Oral Daily  . sodium chloride flush  10-40 mL Intracatheter Q12H   Continuous Infusions: . sodium chloride Stopped (02/21/18 1536)  . sodium chloride 75 mL/hr at 03/04/18 1105     LOS: 23 days   Time Spent in minutes   30 minutes  Jessenia Filippone D.O. on 03/04/2018 at 11:09 AM  Between 7am to 7pm - Please see pager noted on [email or web address]  After 7pm go to [email or web address]  And look for the night coverage person covering for me after hours  [facility] Office  [phone]

## 2018-03-05 LAB — RENAL FUNCTION PANEL
Albumin: 2.1 g/dL — ABNORMAL LOW (ref 3.5–5.0)
Anion gap: 6 (ref 5–15)
BUN: 24 mg/dL — ABNORMAL HIGH (ref 6–20)
CO2: 28 mmol/L (ref 22–32)
Calcium: 8.5 mg/dL — ABNORMAL LOW (ref 8.9–10.3)
Chloride: 104 mmol/L (ref 98–111)
Creatinine, Ser: 2.63 mg/dL — ABNORMAL HIGH (ref 0.61–1.24)
GFR calc Af Amer: 32 mL/min — ABNORMAL LOW (ref >60)
GFR calc non Af Amer: 27 mL/min — ABNORMAL LOW (ref >60)
Glucose, Bld: 96 mg/dL (ref 70–99)
Phosphorus: 4.6 mg/dL (ref 2.5–4.6)
Potassium: 4 mmol/L (ref 3.5–5.1)
Sodium: 138 mmol/L (ref 135–145)

## 2018-03-05 LAB — CBC
HCT: 28.7 % — ABNORMAL LOW (ref 39.0–52.0)
Hemoglobin: 8.8 g/dL — ABNORMAL LOW (ref 13.0–17.0)
MCH: 29 pg (ref 26.0–34.0)
MCHC: 30.7 g/dL (ref 30.0–36.0)
MCV: 94.7 fL (ref 80.0–100.0)
Platelets: 263 10*3/uL (ref 150–400)
RBC: 3.03 MIL/uL — ABNORMAL LOW (ref 4.22–5.81)
RDW: 14.8 % (ref 11.5–15.5)
WBC: 11.4 10*3/uL — ABNORMAL HIGH (ref 4.0–10.5)
nRBC: 0 % (ref 0.0–0.2)

## 2018-03-05 NOTE — Progress Notes (Signed)
[facility] ROUNDING NOTE   Subjective:   HD 12/31 tol well.  UOP 1200 yesterday.  No complaints this AM except wants to go home.   Review of systems:   Denies shortness of breath or cough  Denies chest pain Denies nausea or vomiting    Objective:  Vital signs in last 24 hours:  Temp:  [97.9 F (36.6 C)-98.6 F (37 C)] 98.1 F (36.7 C) (01/01 0836) Pulse Rate:  [66-87] 71 (01/01 0836) Resp:  [16-20] 20 (01/01 0050) BP: (105-144)/(74-111) 138/86 (01/01 0836) SpO2:  [97 %-100 %] 100 % (01/01 0836) Weight:  [59.9 kg-60.6 kg] 59.9 kg (12/31 1532)  Weight change:  Filed Weights   03/04/18 0800 03/04/18 1155 03/04/18 1532  Weight: 60.4 kg 60.6 kg 59.9 kg    Intake/Output: I/O last 3 completed shifts: In: 2144.8 [P.O.:1000; I.V.:1144.8] Out: 3377 [Urine:3525]   Intake/Output this shift:  Total I/O In: 10 [I.V.:10] Out: -   General adult male in bed in no acute distress HEENT normocephalic atraumatic extraocular movements intact sclera anicteric Neck supple trachea midline Lungs clear and unlabored Heart regular rate and rhythm no rubs or gallops appreciated Abdomen soft nontender nondistended Extremities no lower extremity edema  Psych normal mood and affect Access: RIJ vascath in place   Basic Metabolic Panel: Recent Labs  Lab 03/01/18 0758 03/02/18 0500 03/03/18 0553 03/04/18 0500 03/05/18 0445  NA 133* 137 137 140 138  K 3.7 3.8 4.1 4.4 4.0  CL 95* 100 103 109 104  CO2 24 27 23  21* 28  GLUCOSE 97 97 98 95 96  BUN 63* 29* 45* 46* 24*  CREATININE 6.16* 3.66* 4.19* 4.27* 2.63*  CALCIUM 8.4* 8.4* 8.6* 8.6* 8.5*  PHOS 5.7* 4.7* 5.2* 5.6* 4.6    Liver Function Tests: Recent Labs  Lab 03/01/18 0758 03/02/18 0500 03/03/18 0553 03/04/18 0500 03/05/18 0445  ALBUMIN 2.1* 2.0* 2.0* 2.1* 2.1*    CBC: Recent Labs  Lab 02/27/18 0539 02/28/18 0644 03/01/18 0758 03/02/18 0500 03/03/18 0553 03/04/18 0500 03/04/18 1206 03/05/18 0445   WBC 10.5 10.1 8.6  --   --   --  11.0* 11.4*  HGB 7.6* 7.6* 7.7* 7.7* 8.7* 8.8* 8.8* 8.8*  HCT 24.5* 23.7* 24.3* 24.3* 28.3* 27.9* 28.9* 28.7*  MCV 94.2 92.2 92.4  --   --   --  94.8 94.7  PLT PLATELET CLUMPS NOTED ON SMEAR, UNABLE TO ESTIMATE PLATELET CLUMPS NOTED ON SMEAR, UNABLE TO ESTIMATE 175  --   --   --  307 263    Medications:   . sodium chloride Stopped (02/21/18 1536)   . Chlorhexidine Gluconate Cloth  6 each Topical Q0600  . darbepoetin (ARANESP) injection - NON-DIALYSIS  100 mcg Subcutaneous Q Tue-1800  . guaiFENesin  10 mL Oral TID  . heparin injection (subcutaneous)  5,000 Units Subcutaneous Q8H  . Influenza vac split quadrivalent PF  0.5 mL Intramuscular Tomorrow-1000  . pantoprazole  40 mg Oral Daily  . sodium chloride flush  10-40 mL Intracatheter Q12H   sodium chloride, acetaminophen, oxyCODONE **AND** acetaminophen, levalbuterol, [DISCONTINUED] ondansetron **OR** ondansetron (ZOFRAN) IV, sodium chloride flush, traZODone  Assessment/ Plan:   Acute kidney injury in setting of ATN recent history of nonsteroidal anti-inflammatory drugs and IV contrast was started on CRRT 02/12/2018 to 02/17/2018 and started on intermittent hemodialysis 12/18, with last 2 treatments 12/28 and 12/31.    - Vas-Cath is currently in place - Will continue to make daily assessments for dialysis  -  Hold on IVF given good oral intake - D/C foley today. Check PVRs.  - Optimize anemia as below  Anemia on darbepoetin 100 q2 wks, started 12/17. Also has rec'd pRBC.  Stable in the 8s currently.   Acute hypoxic respiratory failure secondary to Legionella ARDS with hx of septic shock.  Now on room air.  S/p Levaquin   Encephalopathy improved.  Conversant though is sometimes confused per report (was ok today)  Hx Atrial fibrillation with rapid ventricular rate.  Now in sinus rhythm. K ok, check Mag tomorrow AM.    LOS: 24 Tyler PitaLindsay A Jolan Mealor 03/05/2018  10:15 AM

## 2018-03-05 NOTE — Progress Notes (Signed)
Physical Therapy Treatment Patient Details Name: Roberto Knapp MRN: [id number] DOB: 08/07/1970 Today's Date: 03/05/2018    History of Present Illness Pt is a 48 y.o. M with no significant PMH who was admitted with fever, aches, SOB, abdominal pain, N/V, BRBPR who was found to have CAP, legionella infection, and progressive hypoxic failure. Developed severe ARDS requiring paralysis and proning. Intubated 12/9-12/18    PT Comments    Patient progressing very well with therapy at this time. Ambulating further distances and stairs with supervision. No overt lob but still demonstrating mild unsteadiness and gait abnormalities different from baseline per family. At this time patient no longer requires CIR, updating recs for outpatient physical therapy.      Follow Up Recommendations  Outpatient PT     Equipment Recommendations  None recommended by PT    Recommendations for Other Services       Precautions / Restrictions Restrictions Weight Bearing Restrictions: No    Mobility  Bed Mobility Overal bed mobility: Modified Independent                Transfers Overall transfer level: Needs assistance   Transfers: Sit to/from Stand Sit to Stand: Supervision            Ambulation/Gait Ambulation/Gait assistance: Supervision Gait Distance (Feet): 400 Feet Assistive device: None Gait Pattern/deviations: Step-to pattern;Step-through pattern Gait velocity: decreased   General Gait Details: patient ambulating hallway with impaired gait compared to baseline, unsteady, wide BOS.    Stairs Stairs: Yes Stairs assistance: Min guard Stair Management: One rail Right;One rail Left;Alternating pattern       Wheelchair Mobility    Modified Rankin (Stroke Patients Only)       Balance Overall balance assessment: Needs assistance Sitting-balance support: Feet supported;Bilateral upper extremity supported Sitting balance-Leahy Scale: Good     Standing balance support:  No upper extremity supported;During functional activity Standing balance-Leahy Scale: Fair                              Cognition Arousal/Alertness: Awake/alert Behavior During Therapy: WFL for tasks assessed/performed                                          Exercises      General Comments        Pertinent Vitals/Pain Pain Assessment: No/denies pain    Home Living                      Prior Function            PT Goals (current goals can now be found in the care plan section) Acute Rehab PT Goals Patient Stated Goal: to get stronger PT Goal Formulation: With patient Time For Goal Achievement: 03/08/18 Potential to Achieve Goals: Good Progress towards PT goals: Progressing toward goals    Frequency    Min 3X/week      PT Plan Discharge plan needs to be updated    Co-evaluation              AM-PAC PT "6 Clicks" Mobility   Outcome Measure  Help needed turning from your back to your side while in a flat bed without using bedrails?: None Help needed moving from lying on your back to sitting on the side of a flat bed without using bedrails?:  None Help needed moving to and from a bed to a chair (including a wheelchair)?: A Little Help needed standing up from a chair using your arms (e.g., wheelchair or bedside chair)?: A Little Help needed to walk in hospital room?: A Little Help needed climbing 3-5 steps with a railing? : A Lot 6 Click Score: 19    End of Session Equipment Utilized During Treatment: Gait belt Activity Tolerance: Patient tolerated treatment well Patient left: in chair;with call bell/phone within reach;with family/visitor present Nurse Communication: Mobility status PT Visit Diagnosis: Muscle weakness (generalized) (M62.81);Other abnormalities of gait and mobility (R26.89);Pain     Time: 1110-1130 PT Time Calculation (min) (ACUTE ONLY): 20 min  Charges:  $Gait Training: 8-22 mins                      Etta Grandchild, PT, DPT Acute Rehabilitation Services Pager: [phone] Office: [phone]     Etta Grandchild 03/05/2018, 11:51 AM

## 2018-03-05 NOTE — Progress Notes (Signed)
PROGRESS NOTE    Roberto Knapp  ZOX:[id number] DOB: 04-13-1970 DOA: 02/09/2018 PCP: [name], MD   Brief Narrative:  HPI On 02/09/2018 by Dr. [name] Roberto Knapp is a 48 y.o. male who denies any significant past medical history, now presenting to the emergency department with approximately 10 days of fevers, generalized aches, abdominal pain, nausea, vomiting, diarrhea, cough, and shortness of breath.  Symptoms began with fevers, generalized aches, abdominal discomfort, and nausea with nonbloody vomiting, and diarrhea, but have progressed to include worsening shortness of breath, productive cough, melena, and then bright red blood per rectum.  Patient had been taking Advil every 4 hours for his symptoms, developed melena after few days, but reports more recent bright red blood in his stool.  Abdominal pain is mainly epigastric.  Denies chest pain.  Denies leg swelling or tenderness.  No headache, change in vision or hearing, or focal numbness or weakness.  He has never experienced these symptoms previously.  No recent travel or sick contacts.  Reports drinking 2-3 beers daily, denies illicit drug use, and denies ever experiencing withdrawal symptoms.  Interim history Admitted with sepsis secondary to multifocal pneumonia.  Found to also have acute hypoxic respiratory failure as well as acute kidney injury, gastritis with anemia. Also developed AKI and required CRRT, nephrology following.  Last HD treatment was on 03/01/2018.  Now pending CIR and further nephrology recommendations.  Assessment & Plan   Acute hypoxic respiratory failure  -Patient noted to have hypoxia on admission, oxygen saturations at rest 87% -secondary to Legionella pneumonia, ARDS  -Influenza PCR negative -Continue to treat underlying condition -appears to be improving - currently on room air  Septic shock secondary to Legionella pneumonia and ARDS -Patient presented with fever, tachycardia, tachypnea,  hypoxia -Initially placed on azithromycin, ceftriaxone followed by Zosyn and vancomycin, eventually transitioned to IV Levaquin and completed course of antibiotics -Patient required IV pressors and was admitted to the ICU.  Was also placed on IV stress dose steroids while in the ICU which have been discontinued -Blood pressure does appear to be stable -Appears patient had hypoxia that required pronating position  Acute toxic and metabolic encephalopathy -Altered factorial including ICU/hospital induced delirium, uremia, hypoxic injury, medications, sepsis -Speech therapy consulted for cognitive and swallowing evaluation -Resolved, appears to be AAOx3  Acute kidney injury with mixed anion gap and non-anion gap metabolic and respiratory acidosis -Nephrology was consulted and appreciated -Secondary to prerenal etiology, ATN and hemodynamic disturbances -Patient did require CRRT in the ICU -Currently requiring intermittent hemodialysis as per nephrology -Last hemodialysis 03/01/2018, creatinine currently 4.27 -Continue follow BMP -Underwent HD on 03/04/2018, currently close monitoring in the hospital.  Foley catheter will be removed.  Dysphagia -Speech therapy consulted- s/p MBS, placed on dysphagia 3 diet -Cortrack removed  Normocytic Anemia/GI bleeding -Anemia likely multifactorial due to acute blood loss secondary to GI bleeding, critical illness, sepsis and renal disease -On admission, patient reported epigastric pain with melena after taking Advil every 4 hours for fever and aches. -FOBT positive -Continue PPI twice daily -Patient does not appear to have any active GI bleeding, GI was not consulted -hemoglobin stable, 8.8 -Continue to monitor CBC  Atrial fibrillation with RVR -Appears paroxysmal, currently patient in sinus rhythm -Was on amiodarone for rate control -Anticoagulation was deferred secondary to GI bleed -Will likely not need anticoagulation moving forward given that  patient does not appear to have further episodes of atrial fibrillation  Elevated LFTs -Secondary to sepsis vs medications vs ?  alcohol use -Upon review of chart, appears to have trended downward and currently normalized  Bilateral leg pain -CK normal.  Etiology unclear -Continue symptomatic treatment, pain control  Diarrhea -Suspect secondary to tube feeding and antibiotic use -Currently no leukocytosis, no abdominal distention nausea or vomiting -Does not appear to be C. difficile, patient was placed on Imodium -Abdominal x-ray showed no acute abnormality  Left upper extremity pain/upper extremity basilic SVT -Mild edema noted -Currently not at risk for further worsening of DVT.  Given bleeding risk anticoagulation held -Treat with warm compresses  Pressure injury -Unstageable, on penis. -Continue wound care -not present on admission  Deconditioning -PT rec CIR -Inpatient rehab consulted- patient potential candidate-however awaiting for PT and OT to reassess patient.  DVT Prophylaxis  SCDs  Code Status: Full  Family Communication: None at bedside  Disposition Plan: Admitted.  Dispo TBD.  Pending inpatient rehab versus possibility of home with home health.  Pending further recommendations regarding HD from nephrology.  Consultants [facility] Nephrology Inpatient rehab  Procedures  Intubation extubation, 12/9 -12/18 Left IJ CVL Right IJ HD cath  Antibiotics   Anti-infectives (From admission, onward)   Start     Dose/Rate Route Frequency Ordered Stop   02/19/18 1000  levofloxacin (LEVAQUIN) IVPB 500 mg     500 mg 100 mL/hr over 60 Minutes Intravenous Every 48 hours 02/17/18 1106 02/23/18 1859   02/14/18 0900  Levofloxacin (LEVAQUIN) IVPB 250 mg  Status:  Discontinued     250 mg 50 mL/hr over 60 Minutes Intravenous Every 24 hours 02/13/18 0815 02/17/18 1106   02/13/18 0830  levofloxacin (LEVAQUIN) IVPB 500 mg     500 mg 100 mL/hr over 60 Minutes Intravenous  Once  02/13/18 0815 02/13/18 1150   02/12/18 2000  vancomycin (VANCOCIN) 1,500 mg in sodium chloride 0.9 % 500 mL IVPB  Status:  Discontinued     1,500 mg 250 mL/hr over 120 Minutes Intravenous Every 24 hours 02/11/18 1406 02/11/18 1521   02/12/18 2000  piperacillin-tazobactam (ZOSYN) IVPB 3.375 g  Status:  Discontinued     3.375 g 100 mL/hr over 30 Minutes Intravenous Every 6 hours 02/12/18 1408 02/13/18 0815   02/10/18 2000  vancomycin (VANCOCIN) 1,250 mg in sodium chloride 0.9 % 250 mL IVPB  Status:  Discontinued     1,250 mg 166.7 mL/hr over 90 Minutes Intravenous Every 24 hours 02/09/18 1839 02/10/18 0728   02/10/18 1530  vancomycin (VANCOCIN) IVPB 750 mg/150 ml premix  Status:  Discontinued     750 mg 150 mL/hr over 60 Minutes Intravenous Every 12 hours 02/10/18 1432 02/11/18 1406   02/10/18 1400  piperacillin-tazobactam (ZOSYN) IVPB 3.375 g  Status:  Discontinued     3.375 g 12.5 mL/hr over 240 Minutes Intravenous Every 8 hours 02/10/18 0731 02/12/18 1408   02/10/18 0745  piperacillin-tazobactam (ZOSYN) IVPB 3.375 g     3.375 g 100 mL/hr over 30 Minutes Intravenous  Once 02/10/18 0728 02/10/18 0921   02/10/18 0600  ceFEPIme (MAXIPIME) 2 g in sodium chloride 0.9 % 100 mL IVPB  Status:  Discontinued     2 g 200 mL/hr over 30 Minutes Intravenous Every 12 hours 02/09/18 1839 02/09/18 2014   02/10/18 0200  cefTRIAXone (ROCEPHIN) 1 g in sodium chloride 0.9 % 100 mL IVPB  Status:  Discontinued     1 g 200 mL/hr over 30 Minutes Intravenous Every 24 hours 02/09/18 2014 02/10/18 0728   02/09/18 2015  azithromycin (ZITHROMAX) 500 mg in sodium chloride  0.9 % 250 mL IVPB  Status:  Discontinued     500 mg 250 mL/hr over 60 Minutes Intravenous Every 24 hours 02/09/18 2014 02/12/18 1306   02/09/18 1830  vancomycin (VANCOCIN) 1,250 mg in sodium chloride 0.9 % 250 mL IVPB     1,250 mg 166.7 mL/hr over 90 Minutes Intravenous  Once 02/09/18 1800 02/09/18 2127   02/09/18 1800  ceFEPIme (MAXIPIME) 2 g in  sodium chloride 0.9 % 100 mL IVPB     2 g 200 mL/hr over 30 Minutes Intravenous  Once 02/09/18 1745 02/09/18 1925   02/09/18 1800  metroNIDAZOLE (FLAGYL) IVPB 500 mg  Status:  Discontinued     500 mg 100 mL/hr over 60 Minutes Intravenous Every 8 hours 02/09/18 1745 02/10/18 0728   02/09/18 1800  vancomycin (VANCOCIN) IVPB 1000 mg/200 mL premix  Status:  Discontinued     1,000 mg 200 mL/hr over 60 Minutes Intravenous  Once 02/09/18 1745 02/09/18 1800      Subjective:   [name] seen and examined today.  Patient has no complaint today.  Feeling better.  Denies current chest pain, shortness breath, abdominal pain, nausea or vomiting, diarrhea or constipation, dizziness or headache. Objective:   Vitals:   03/04/18 1532 03/04/18 1754 03/05/18 0050 03/05/18 0836  BP: 129/81 (!) 130/111 105/74 138/86  Pulse: 84 87 87 71  Resp: 16  20   Temp: 98.1 F (36.7 C) 98.4 F (36.9 C) 98.6 F (37 C) 98.1 F (36.7 C)  TempSrc: Oral Oral Oral Oral  SpO2: 98% 98% 98% 100%  Weight: 59.9 kg     Height:        Intake/Output Summary (Last 24 hours) at 03/05/2018 1637 Last data filed at 03/05/2018 1137 Gross per 24 hour  Intake 151.81 ml  Output 1400 ml  Net -1248.19 ml   Filed Weights   03/04/18 0800 03/04/18 1155 03/04/18 1532  Weight: 60.4 kg 60.6 kg 59.9 kg   Exam  General: Well developed, well nourished, NAD, appears stated age  HEENT: NCAT, mucous membranes moist.   Neck: Supple  Cardiovascular: S1 S2 auscultated, RRR, no murmur  Respiratory: Clear to auscultation bilaterally with equal chest rise  Abdomen: Soft, nontender, nondistended, + bowel sounds  Extremities: warm dry without cyanosis clubbing or edema  Neuro: AAOx3, nonfocal  Psych: Pleasant, appropriate mood and affect  Data Reviewed: I have personally reviewed following labs and imaging studies  CBC: Recent Labs  Lab 02/27/18 0539 02/28/18 0644 03/01/18 0758 03/02/18 0500 03/03/18 0553 03/04/18 0500  03/04/18 1206 03/05/18 0445  WBC 10.5 10.1 8.6  --   --   --  11.0* 11.4*  HGB 7.6* 7.6* 7.7* 7.7* 8.7* 8.8* 8.8* 8.8*  HCT 24.5* 23.7* 24.3* 24.3* 28.3* 27.9* 28.9* 28.7*  MCV 94.2 92.2 92.4  --   --   --  94.8 94.7  PLT PLATELET CLUMPS NOTED ON SMEAR, UNABLE TO ESTIMATE PLATELET CLUMPS NOTED ON SMEAR, UNABLE TO ESTIMATE 175  --   --   --  307 263   Basic Metabolic Panel: Recent Labs  Lab 03/01/18 0758 03/02/18 0500 03/03/18 0553 03/04/18 0500 03/05/18 0445  NA 133* 137 137 140 138  K 3.7 3.8 4.1 4.4 4.0  CL 95* 100 103 109 104  CO2 24 27 23  21* 28  GLUCOSE 97 97 98 95 96  BUN 63* 29* 45* 46* 24*  CREATININE 6.16* 3.66* 4.19* 4.27* 2.63*  CALCIUM 8.4* 8.4* 8.6* 8.6* 8.5*  PHOS 5.7*  4.7* 5.2* 5.6* 4.6   GFR: Estimated Creatinine Clearance: 28.8 mL/min (A) (by C-G formula based on SCr of 2.63 mg/dL (H)). Liver Function Tests: Recent Labs  Lab 03/01/18 0758 03/02/18 0500 03/03/18 0553 03/04/18 0500 03/05/18 0445  ALBUMIN 2.1* 2.0* 2.0* 2.1* 2.1*   No results for input(s): LIPASE, AMYLASE in the last 168 hours. No results for input(s): AMMONIA in the last 168 hours. Coagulation Profile: No results for input(s): INR, PROTIME in the last 168 hours. Cardiac Enzymes: No results for input(s): CKTOTAL, CKMB, CKMBINDEX, TROPONINI in the last 168 hours. BNP (last 3 results) No results for input(s): PROBNP in the last 8760 hours. HbA1C: No results for input(s): HGBA1C in the last 72 hours. CBG: Recent Labs  Lab 02/27/18 1947 02/27/18 2317 02/28/18 0742 02/28/18 1252 03/01/18 0743  GLUCAP 101* 95 103* 116* 88   Lipid Profile: No results for input(s): CHOL, HDL, LDLCALC, TRIG, CHOLHDL, LDLDIRECT in the last 72 hours. Thyroid Function Tests: No results for input(s): TSH, T4TOTAL, FREET4, T3FREE, THYROIDAB in the last 72 hours. Anemia Panel: No results for input(s): VITAMINB12, FOLATE, FERRITIN, TIBC, IRON, RETICCTPCT in the last 72 hours. Urine analysis:     Component Value Date/Time   COLORURINE AMBER (A) 02/09/2018 1816   APPEARANCEUR CLEAR 02/09/2018 1816   LABSPEC 1.019 02/09/2018 1816   PHURINE 6.0 02/09/2018 1816   GLUCOSEU NEGATIVE 02/09/2018 1816   HGBUR SMALL (A) 02/09/2018 1816   BILIRUBINUR NEGATIVE 02/09/2018 1816   KETONESUR NEGATIVE 02/09/2018 1816   PROTEINUR 100 (A) 02/09/2018 1816   UROBILINOGEN 1.0 10/21/2014 0951   NITRITE NEGATIVE 02/09/2018 1816   LEUKOCYTESUR NEGATIVE 02/09/2018 1816   Sepsis Labs: @LABRCNTIP (procalcitonin:4,lacticidven:4)  )No results found for this or any previous visit (from the past 240 hour(s)).    Radiology Studies: No results found.   Scheduled Meds: . Chlorhexidine Gluconate Cloth  6 each Topical Q0600  . darbepoetin (ARANESP) injection - NON-DIALYSIS  100 mcg Subcutaneous Q Tue-1800  . guaiFENesin  10 mL Oral TID  . heparin injection (subcutaneous)  5,000 Units Subcutaneous Q8H  . Influenza vac split quadrivalent PF  0.5 mL Intramuscular Tomorrow-1000  . pantoprazole  40 mg Oral Daily  . sodium chloride flush  10-40 mL Intracatheter Q12H   Continuous Infusions: . sodium chloride Stopped (02/21/18 1536)     LOS: 24 days   Time Spent in minutes   30 minutes  Author:  Lynden Oxford, MD [facility] Pager: [phone] 03/05/2018 4:39 PM     Between 7am to 7pm - Please see pager noted on [email or web address]  After 7pm go to [email or web address]  And look for the night coverage person covering for me after hours  [facility] Office  [phone]

## 2018-03-06 LAB — RENAL FUNCTION PANEL
Albumin: 2.2 g/dL — ABNORMAL LOW (ref 3.5–5.0)
Anion gap: 8 (ref 5–15)
BUN: 32 mg/dL — ABNORMAL HIGH (ref 6–20)
CO2: 25 mmol/L (ref 22–32)
Calcium: 8.7 mg/dL — ABNORMAL LOW (ref 8.9–10.3)
Chloride: 105 mmol/L (ref 98–111)
Creatinine, Ser: 2.99 mg/dL — ABNORMAL HIGH (ref 0.61–1.24)
GFR calc Af Amer: 27 mL/min — ABNORMAL LOW (ref >60)
GFR calc non Af Amer: 23 mL/min — ABNORMAL LOW (ref >60)
Glucose, Bld: 94 mg/dL (ref 70–99)
Phosphorus: 5.1 mg/dL — ABNORMAL HIGH (ref 2.5–4.6)
Potassium: 4.1 mmol/L (ref 3.5–5.1)
Sodium: 138 mmol/L (ref 135–145)

## 2018-03-06 LAB — CBC WITH DIFFERENTIAL/PLATELET
Abs Immature Granulocytes: 0.08 10*3/uL — ABNORMAL HIGH (ref 0.00–0.07)
Basophils Absolute: 0.1 10*3/uL (ref 0.0–0.1)
Basophils Relative: 1 %
Eosinophils Absolute: 0.9 10*3/uL — ABNORMAL HIGH (ref 0.0–0.5)
Eosinophils Relative: 9 %
HCT: 29.4 % — ABNORMAL LOW (ref 39.0–52.0)
Hemoglobin: 8.9 g/dL — ABNORMAL LOW (ref 13.0–17.0)
Immature Granulocytes: 1 %
Lymphocytes Relative: 18 %
Lymphs Abs: 1.7 10*3/uL (ref 0.7–4.0)
MCH: 28.8 pg (ref 26.0–34.0)
MCHC: 30.3 g/dL (ref 30.0–36.0)
MCV: 95.1 fL (ref 80.0–100.0)
Monocytes Absolute: 0.8 10*3/uL (ref 0.1–1.0)
Monocytes Relative: 8 %
Neutro Abs: 6 10*3/uL (ref 1.7–7.7)
Neutrophils Relative %: 63 %
Platelets: 295 10*3/uL (ref 150–400)
RBC: 3.09 MIL/uL — ABNORMAL LOW (ref 4.22–5.81)
RDW: 14.6 % (ref 11.5–15.5)
WBC: 9.6 10*3/uL (ref 4.0–10.5)
nRBC: 0 % (ref 0.0–0.2)

## 2018-03-06 LAB — MAGNESIUM: Magnesium: 1.5 mg/dL — ABNORMAL LOW (ref 1.7–2.4)

## 2018-03-06 MED ORDER — PANTOPRAZOLE SODIUM 40 MG PO TBEC: 40.0000 mg | DELAYED_RELEASE_TABLET | Freq: Every day | ORAL | 0 refills | Status: DC

## 2018-03-06 NOTE — Discharge Summary (Signed)
[facility] Discharge Summary   Patient: Roberto Knapp ZOX:[id number]   PCP: [name], MD DOB: Dec 03, 1970   Date of admission: 02/09/2018   Date of discharge: 03/06/2018    Discharge Diagnoses:  Principal Problem:   Sepsis due to pneumonia [facility]) Active Problems:   Hyponatremia   Hypokalemia   GI bleeding   Elevated transaminase level   Acute respiratory failure (HCC)   Hypoxia   Acute respiratory failure with hypoxemia (HCC)   Community acquired pneumonia   Septic shock (HCC)   ARDS (adult respiratory distress syndrome) (HCC)   Pressure injury of skin   Shortness of breath   Admitted From: home Disposition:  home  Recommendations for Outpatient Follow-up:  1. Follow-up with PCP in 1 week, follow-up with nephrology as recommended.  Follow-up Information    [name], MD. Schedule an appointment as soon as possible for a visit in 1 week(s).   Specialty:  Family Medicine Contact information: [phone] [address] [place] [address] [phone]        Tyler Pita, MD Follow up on 03/11/2018.   Specialty:  Internal Medicine Why:  Visit scheduled for 03/11/18 at 4pm.  [address]  Ph [phone]. Contact information: [address] [place] [address] [phone]        [facility] Go to.   Specialty:  Rehabilitation Why:  Outpatient physical therapy and occupational therapy; please call the number listed if you haven't received a call in 2-3 days.  Contact information: [address] [phone] mc [place] [address] [phone]         Diet recommendation: renal diet  Activity: The patient is advised to gradually reintroduce usual activities.  Discharge Condition: good  Code Status: full code  History of present illness: As per the H and P dictated on admission, "Roberto Knapp is a 48 y.o. male who denies any significant past medical history, now presenting to the  emergency department with approximately 10 days of fevers, generalized aches, abdominal pain, nausea, vomiting, diarrhea, cough, and shortness of breath.  Symptoms began with fevers, generalized aches, abdominal discomfort, and nausea with nonbloody vomiting, and diarrhea, but have progressed to include worsening shortness of breath, productive cough, melena, and then bright red blood per rectum.  Patient had been taking Advil every 4 hours for his symptoms, developed melena after few days, but reports more recent bright red blood in his stool.  Abdominal pain is mainly epigastric.  Denies chest pain.  Denies leg swelling or tenderness.  No headache, change in vision or hearing, or focal numbness or weakness.  He has never experienced these symptoms previously.  No recent travel or sick contacts.  Reports drinking 2-3 beers daily, denies illicit drug use, and denies ever experiencing withdrawal symptoms."  Hospital Course:  Summary of his active problems in the hospital is as following. Acute hypoxic respiratory failure  -Patient noted to have hypoxia on admission, oxygen saturations at rest 87% -secondary to Legionella pneumonia, ARDS  -Influenza PCR negative -Continue to treat underlying condition -appears to be improving - currently on room air  Septic shock secondary to Legionella pneumonia and ARDS -Patient presented with fever, tachycardia, tachypnea, hypoxia -Initially placed on azithromycin, ceftriaxone followed by Zosyn and vancomycin, eventually transitioned to IV Levaquin and completed course of antibiotics -Patient required IV pressors and was admitted to the ICU.  Was also placed on IV stress dose steroids while in the ICU which have been discontinued -Blood  pressure does appear to be stable -Appears patient had hypoxia that required pronating position  Acute toxic and metabolic encephalopathy -Altered factorial including ICU/hospital induced delirium, uremia, hypoxic injury,  medications, sepsis -Speech therapy consulted for cognitive and swallowing evaluation -Resolved, appears to be AAOx3  Acute kidney injury with mixed anion gap and non-anion gap metabolic and respiratory acidosis -Nephrology was consulted and appreciated -Secondary to prerenal etiology, ATN and hemodynamic disturbances -Patient did require CRRT in the ICU -Currently requiring intermittent hemodialysis as per nephrology -Last hemodialysis 03/01/2018, creatinine currently 4.27 -Continue follow BMP -Underwent HD on 03/04/2018, currently close monitoring in the hospital.  Foley catheter will be removed.  Dysphagia -Speech therapy consulted- s/p MBS, placed on dysphagia 3 diet -Cortrack removed -Outpatient speech therapy recommended.  Normocytic Anemia/GI bleeding -Anemia likely multifactorial due to acute blood loss secondary to GI bleeding, critical illness, sepsis and renal disease -On admission, patient reported epigastric pain with melena after taking Advil every 4 hours for fever and aches. -FOBT positive -Continue PPI twice daily -Patient does not appear to have any active GI bleeding, GI was not consulted -hemoglobin stable  Atrial fibrillation with RVR -Appears paroxysmal, currently patient in sinus rhythm -Was on amiodarone for rate control -Anticoagulation was deferred secondary to GI bleed -Will likely not need anticoagulation moving forward given that patient does not appear to have further episodes of atrial fibrillation  Elevated LFTs -Secondary to sepsis vs medications vs ?alcohol use -Upon review of chart, appears to have trended downward and currently normalized  Bilateral leg pain -CK normal.  Etiology unclear -Continue symptomatic treatment, pain control  Diarrhea resolved  -Suspect secondary to tube feeding and antibiotic use -Currently no leukocytosis, no abdominal distention nausea or vomiting -Does not appear to be C. difficile, patient was placed  on Imodium -Abdominal x-ray showed no acute abnormality  Left upper extremity pain/upper extremity basilic SVT -Mild edema noted -Currently not at risk for further worsening of DVT.   -Given bleeding risk anticoagulation held -Treat with warm compresses  Deconditioning -PT rec CIR initially but over the period of Time the patient improved to be discharged to outpatient PT.  All other chronic medical condition were stable during the hospitalization.  Patient was seen by physical therapy, who recommended outpatient PT On the day of the discharge the patient's vitals were stable , and no other acute medical condition were reported by patient. the patient was felt safe to be discharge at home with family.  Consultants: PCCM Nephrology  CIR Procedures: Intubation extubation, 12/9 -12/18 Left IJ CVL Right IJ HD cath  DISCHARGE MEDICATION: Allergies as of 03/06/2018   No Known Allergies     Medication List    STOP taking these medications   ibuprofen 200 MG tablet Commonly known as:  ADVIL,MOTRIN   lisdexamfetamine 50 MG capsule Commonly known as:  VYVANSE     TAKE these medications   pantoprazole 40 MG tablet Commonly known as:  PROTONIX Take 1 tablet (40 mg total) by mouth daily. Start taking on:  March 07, 2018   TYLENOL PO Take 2 tablets by mouth every 6 (six) hours as needed (pain/fever/headache).      No Known Allergies Discharge Instructions    Ambulatory referral to Physical Therapy   Complete by:  As directed    Outpatient physical therapy and occupational therapy   Iontophoresis - 4 mg/ml of dexamethasone:  No   T.E.N.S. Unit Evaluation and Dispense as Indicated:  No   DIET DYS 3   Complete  by:  As directed    Fluid consistency:  Thin   Discharge instructions   Complete by:  As directed    It is important that you read following instructions as well as go over your medication list with RN to help you understand your care after this  hospitalization.  Discharge Instructions: Please follow-up with PCP in one week  Please request your primary care physician to go over all Hospital Tests and Procedure/Radiological results at the follow up,  Please get all Hospital records sent to your PCP by signing hospital release before you go home.   Do not drive, operating heavy machinery, perform activities at heights, swimming or participation in water activities or provide baby sitting services; until you have been seen by Primary Care Physician or a Neurologist and advised to do so again. Do not take more than prescribed Pain, Sleep and Anxiety Medications. You were cared for by a hospitalist during your hospital stay. If you have any questions about your discharge medications or the care you received while you were in the hospital after you are discharged, you can call the unit you were admitted to and ask to speak with the hospitalist on call if the hospitalist that took care of you is not available.  Once you are discharged, your primary care physician will handle any further medical issues. Please note that NO REFILLS for any discharge medications will be authorized once you are discharged, as it is imperative that you return to your primary care physician (or establish a relationship with a primary care physician if you do not have one) for your aftercare needs so that they can reassess your need for medications and monitor your lab values. You Must read complete instructions/literature along with all the possible adverse reactions/side effects for all the Medicines you take and that have been prescribed to you. Take any new Medicines after you have completely understood and accept all the possible adverse reactions/side effects. Wear Seat belts while driving. If you have smoked or chewed Tobacco in the last 2 yrs please stop smoking and/or stop any Recreational drug use.   Increase activity slowly   Complete by:  As directed       Discharge Exam: Filed Weights   03/04/18 1155 03/04/18 1532 03/06/18 0600  Weight: 60.6 kg 59.9 kg 60.1 kg   Vitals:   03/05/18 2348 03/06/18 0806  BP: (!) 133/91 128/84  Pulse: 75 76  Resp: 18   Temp: 98.5 F (36.9 C) 97.9 F (36.6 C)  SpO2: 100% 99%   General: Appear in no distress, no Rash; Oral Mucosa moist. Cardiovascular: S1 and S2 Present, no Murmur, no JVD Respiratory: Bilateral Air entry present and Clear to Auscultation, no Crackles, no wheezes Abdomen: Bowel Sound present, Soft and no tenderness Extremities: no Pedal edema, no calf tenderness Neurology: Grossly no focal neuro deficit.  The results of significant diagnostics from this hospitalization (including imaging, microbiology, ancillary and laboratory) are listed below for reference.    Significant Diagnostic Studies: Ct Abdomen Pelvis W Contrast  Result Date: 02/09/2018 CLINICAL DATA:  Fever and generalized body aches since Thanksgiving. New onset diarrhea about a week ago. EXAM: CT ABDOMEN AND PELVIS WITH CONTRAST TECHNIQUE: Multidetector CT imaging of the abdomen and pelvis was performed using the standard protocol following bolus administration of intravenous contrast. CONTRAST:  OMNIPAQUE IOHEXOL 300 MG/ML  SOLN COMPARISON:  182016 FINDINGS: Lower chest: Confluent airspace opacity is identified in the lingula and both lower lobes. Hepatobiliary: No  focal abnormality within the liver parenchyma. There is no evidence for gallstones, gallbladder wall thickening, or pericholecystic fluid. No intrahepatic or extrahepatic biliary dilation. Pancreas: No focal mass lesion. No dilatation of the main duct. No intraparenchymal cyst. No peripancreatic edema. Spleen: No splenomegaly. No focal mass lesion. Adrenals/Urinary Tract: No adrenal nodule or mass. Kidneys unremarkable. No evidence for hydroureter. The urinary bladder appears normal for the degree of distention. Stomach/Bowel: Small hiatal hernia. Stomach  otherwise unremarkable. Duodenum is normally positioned as is the ligament of Treitz. No small bowel wall thickening. No small bowel dilatation. The terminal ileum is normal. The appendix is normal. No gross colonic mass. No colonic wall thickening. Vascular/Lymphatic: No abdominal aortic aneurysm. No abdominal aortic atherosclerotic calcification. There is no gastrohepatic or hepatoduodenal ligament lymphadenopathy. No intraperitoneal or retroperitoneal lymphadenopathy. No pelvic sidewall lymphadenopathy. Reproductive: The prostate gland and seminal vesicles have normal imaging features. Other: No intraperitoneal free fluid. Musculoskeletal: No worrisome lytic or sclerotic osseous abnormality. IMPRESSION: 1. Extensive airspace disease in the lung bases compatible with pneumonia. 2. No acute findings in the abdomen or pelvis. Electronically Signed   By: Kennith Center M.D.   On: 02/09/2018 19:44   Dg Chest Port 1 View  Result Date: 02/22/2018 CLINICAL DATA:  Acute respiratory failure. EXAM: PORTABLE CHEST 1 VIEW COMPARISON:  02/21/2018 FINDINGS: Right IJ central venous catheter unchanged with tip over the SVC. Enteric tube courses into the region of the stomach and off the film as tip is not visualized. Lungs are adequately inflated demonstrate worsening patchy bilateral airspace process right worse than left likely multifocal infection. No effusion. Cardiomediastinal silhouette and remainder of the exam is unchanged. IMPRESSION: Slight worsening bilateral patchy airspace process right worse than left likely multifocal infection. Tubes and lines as described. Electronically Signed   By: Elberta Fortis M.D.   On: 02/22/2018 09:02   Dg Chest Port 1 View  Result Date: 02/21/2018 CLINICAL DATA:  Acute respiratory failure EXAM: PORTABLE CHEST 1 VIEW COMPARISON:  02/19/2017 FINDINGS: Interval extubation and removal of NG tube. Right Vas-Cath, left central line remain in place, unchanged. Diffuse right lung  airspace disease and patchy left lower lobe opacities slightly increased since prior study. Heart is upper limits normal in size. No visible effusions. IMPRESSION: Diffuse right lung airspace disease and left lower lobe airspace opacity, slightly worsened since prior study. Electronically Signed   By: Charlett Nose M.D.   On: 02/21/2018 07:06   Dg Chest Port 1 View  Result Date: 02/19/2018 CLINICAL DATA:  ET tube EXAM: PORTABLE CHEST 1 VIEW COMPARISON:  02/18/2018 FINDINGS: Support devices are stable. Patchy bilateral airspace disease again noted, slightly improved. Heart is normal size. No visible effusions. IMPRESSION: Patchy bilateral airspace disease, right greater than left, slightly improved since prior study. Electronically Signed   By: Charlett Nose M.D.   On: 02/19/2018 07:39   Dg Chest Port 1 View  Result Date: 02/18/2018 CLINICAL DATA:  Endotracheal tube EXAM: PORTABLE CHEST 1 VIEW COMPARISON:  02/17/2018 FINDINGS: Endotracheal tube in good position. Right jugular central venous catheter tip in the SVC. Left jugular central venous catheter tip in the SVC. No pneumothorax. NG tube in the stomach. Asymmetric airspace disease right greater than left unchanged. No significant effusion. IMPRESSION: Bilateral airspace disease right greater than left is stable. Support lines remain in good position. Overall no change from yesterday. Electronically Signed   By: Marlan Palau M.D.   On: 02/18/2018 07:32   Dg Chest Port 1 View  Result Date:  02/17/2018 CLINICAL DATA:  Ventilator support. EXAM: PORTABLE CHEST 1 VIEW COMPARISON:  02/16/2018 FINDINGS: Endotracheal tube tip is 6 cm above the carina. Nasogastric tube enters the stomach. Right internal jugular central line tip in the SVC at the azygos level. Left internal jugular central line tip in the SVC just above the right atrium. Infiltrate throughout the right lower lobe and to a lesser extent within the left lower lobe appear similar. No worsening or  new finding. IMPRESSION: No change. Lines and tubes satisfactory. Bilateral lower lobe pneumonia right more extensive than left. Electronically Signed   By: Paulina Fusi M.D.   On: 02/17/2018 06:50   Dg Chest Port 1 View  Result Date: 02/16/2018 CLINICAL DATA:  Acute respiratory failure with hypoxia. EXAM: PORTABLE CHEST 1 VIEW COMPARISON:  Radiograph February 15, 2018. FINDINGS: The heart size and mediastinal contours are within normal limits. Endotracheal and nasogastric tubes are unchanged in position. Bilateral internal jugular catheters are unchanged in position. No pneumothorax is noted. Stable large right lung opacity is noted concerning for pneumonia with associated effusion. Stable mild left basilar airspace opacity is noted concerning for pneumonia. The visualized skeletal structures are unremarkable. IMPRESSION: Stable support apparatus. Stable bilateral lung opacities as described above, right greater than left. Electronically Signed   By: Lupita Raider, M.D.   On: 02/16/2018 07:44   Dg Chest Port 1 View  Result Date: 02/15/2018 CLINICAL DATA:  Acute respiratory failure. EXAM: PORTABLE CHEST 1 VIEW COMPARISON:  02/14/2018 FINDINGS: Enteric tube tip is above the carina. There is a right IJ catheter with tip in the projection of the SVC. Left IJ catheter tip is at the level of the cavoatrial junction. Normal heart size. Bilateral airspace disease, right greater than left. Not significantly changed from previous exam. IMPRESSION: 1. No significant change in aeration to the lungs compared with previous study. 2. Stable support apparatus. Electronically Signed   By: Signa Kell M.D.   On: 02/15/2018 08:24   Dg Chest Port 1 View  Result Date: 02/14/2018 CLINICAL DATA:  Acute respiratory failure with hypoxia EXAM: PORTABLE CHEST 1 VIEW COMPARISON:  02/13/2018 FINDINGS: Endotracheal tube in good position. New right jugular central venous catheter tip in the SVC. Left jugular central venous  catheter tip in the lower SVC. No pneumothorax. NG tube enters the stomach Extensive bilateral airspace disease right greater than left appears stable. IMPRESSION: Extensive bilateral airspace disease right greater than left is unchanged. Electronically Signed   By: Marlan Palau M.D.   On: 02/14/2018 07:43   Dg Chest Port 1 View  Result Date: 02/13/2018 CLINICAL DATA:  Respiratory failure and hypoxia EXAM: PORTABLE CHEST 1 VIEW COMPARISON:  02/12/2018 FINDINGS: Cardiac shadow is stable. Endotracheal tube, nasogastric catheter and bilateral jugular catheters are again noted and stable. The lungs are well aerated bilaterally with diffuse infiltrate throughout the right lung as well as within the left lung base. No pneumothorax is seen. No bony abnormality is noted. IMPRESSION: Diffuse infiltrates right greater than left similar to that seen on the prior exam. Tubes and lines stable in appearance. Electronically Signed   By: Alcide Clever M.D.   On: 02/13/2018 07:52   Dg Chest Port 1 View  Result Date: 02/12/2018 CLINICAL DATA:  Status post central line placement. EXAM: PORTABLE CHEST 1 VIEW COMPARISON:  02/12/2018 at 0513 hours FINDINGS: Endotracheal tube and left jugular catheter are unchanged. Enteric tube courses into the left upper abdomen with tip not imaged. A new right jugular catheter terminates  over the mid to upper SVC. Extensive airspace consolidation throughout the majority of the right lung and throughout the left lower lobe is unchanged. There may be a small right pleural effusion. No pneumothorax is identified. IMPRESSION: 1. New right jugular catheter terminates over the mid to upper SVC. No pneumothorax. 2. Unchanged extensive bilateral airspace disease. Electronically Signed   By: Sebastian Ache M.D.   On: 02/12/2018 12:26   Dg Chest Port 1 View  Result Date: 02/12/2018 CLINICAL DATA:  48 y/o M; respiratory failure and shortness of breath. EXAM: PORTABLE CHEST 1 VIEW COMPARISON:   02/11/2018 chest radiograph FINDINGS: Stable endotracheal tube and left central venous catheter. Enteric tube extends below field of view and abdomen. Stable right-greater-than-left diffuse pulmonary consolidation. No pneumothorax. Bones are unremarkable. IMPRESSION: Stable lines and tubes.  Stable severe airspace disease. Electronically Signed   By: Mitzi Hansen M.D.   On: 02/12/2018 05:36   Dg Chest Port 1 View  Result Date: 02/11/2018 CLINICAL DATA:  Acute respiratory failure with hypoxia EXAM: PORTABLE CHEST 1 VIEW COMPARISON:  Yesterday FINDINGS: Extensive airspace disease asymmetric to the right. There is reported history of pneumonia and sepsis. Normal heart size. No effusion or pneumothorax. Endotracheal tube with tip between the clavicular heads and carina. Left IJ line with tip at the upper cavoatrial junction. An orogastric tube at least reaches the stomach. IMPRESSION: Stable hardware positioning and severe bilateral airspace disease. Electronically Signed   By: Marnee Spring M.D.   On: 02/11/2018 12:55   Dg Chest Port 1 View  Result Date: 02/10/2018 CLINICAL DATA:  Intubated patient, respiratory failure, drug overdose, septic shock, current smoker. EXAM: PORTABLE CHEST 1 VIEW COMPARISON:  Portable chest x-ray of February 10, 2018 at 6:06 a.m. FINDINGS: Confluent airspace opacities are present throughout the right lung and are little changed. On the left increasing airspace opacity is developing in the upper lobe. Persistent increased density in the lower lobe is present. The heart is normal in size. The central pulmonary vascularity is prominent. External pacemaker defibrillator pads are present. The endotracheal tube tip projects 4.1 cm above the carina. The esophagogastric tube tip in proximal port project below the GE junction. The left internal jugular venous catheter tip projects over the midportion of the SVC. IMPRESSION: Interval intubation of the trachea and esophagus and  placement of a left internal jugular venous catheter. The support structures are in reasonable position. Worsening airspace opacity in the left upper lung. Fairly stable airspace opacities elsewhere in both lungs. Electronically Signed   By: David  [place] M.D.   On: 02/10/2018 15:22   Dg Chest Port 1 View  Result Date: 02/10/2018 CLINICAL DATA:  48 year old male with possible sepsis and pneumonia. EXAM: PORTABLE CHEST 1 VIEW COMPARISON:  Chest radiograph dated 02/09/2018 FINDINGS: Bilateral airspace opacities, right greater left similar or slightly worsened since the prior radiograph. No large pleural effusion. No pneumothorax. The cardiac silhouette is within normal limits. No acute osseous pathology. IMPRESSION: Bilateral airspace opacities, right greater left, similar or slightly worsened since the prior radiograph. Electronically Signed   By: Elgie Collard M.D.   On: 02/10/2018 06:38   Dg Chest Port 1 View  Result Date: 02/09/2018 CLINICAL DATA:  Nausea/vomiting, body aches, fever, bloody diarrhea, abdominal pain EXAM: PORTABLE CHEST 1 VIEW COMPARISON:  07/10/2011 FINDINGS: Multifocal patchy opacities in the right upper lobe, right lower lobe, and left upper lobe/lingula. Despite the perihilar distribution (raising the possibility of interstitial edema), multifocal pneumonia is favored. There are no definite  pleural effusions. No pneumothorax. The heart is normal in size. IMPRESSION: Multifocal patchy opacities in the bilateral upper lobes and right lower lobe, favoring multifocal pneumonia, less likely interstitial edema. No definite pleural effusions. Electronically Signed   By: Charline BillsSriyesh  Krishnan M.D.   On: 02/09/2018 19:17   Dg Abd Portable 1v  Result Date: 02/25/2018 CLINICAL DATA:  Nausea EXAM: PORTABLE ABDOMEN - 1 VIEW COMPARISON:  02/09/2018 FINDINGS: Contrast material is noted throughout colon consistent with the recent modified barium swallow. No obstructive changes are seen. Feeding  catheter is noted within the distal stomach. No bony abnormality is seen. IMPRESSION: No acute abnormality noted. Electronically Signed   By: Alcide CleverMark  Lukens M.D.   On: 02/25/2018 20:41   Dg Abd Portable 1 View  Result Date: 02/09/2018 CLINICAL DATA:  Nausea/vomiting, abdominal pain, diarrhea, fever EXAM: PORTABLE ABDOMEN - 1 VIEW COMPARISON:  None. FINDINGS: Nonobstructive bowel gas pattern. Visualized osseous structures are within normal limits. IMPRESSION: Unremarkable abdominal radiograph. Electronically Signed   By: Charline BillsSriyesh  Krishnan M.D.   On: 02/09/2018 19:18   Dg Swallowing Func-speech Pathology  Result Date: 02/28/2018 Objective Swallowing Evaluation: Type of Study: MBS-Modified Barium Swallow Study  Patient Details Name: Roberto Knapp MRN: [id number] Date of Birth: [birth year] Today's Date: 02/28/2018 Time: SLP Start Time (ACUTE ONLY): 1341 -SLP Stop Time (ACUTE ONLY): 1359 SLP Time Calculation (min) (ACUTE ONLY): 18 min Past Medical History: Past Medical History: Diagnosis Date . Drug overdose, intentional [facility](HCC)  Past Surgical History: Past Surgical History: Procedure Laterality Date . FASCIOTOMY  09/08/2011  Procedure: FASCIOTOMY;  Surgeon: Sherren Kernsharles E Fields, MD;  [facility];  Service: Vascular;  Laterality: Left; . FEMORAL-POPLITEAL BYPASS GRAFT  09/08/2011  Procedure: BYPASS GRAFT FEMORAL-POPLITEAL ARTERY;  Surgeon: Sherren Kernsharles E Fields, MD;  [facility];  Service: Vascular;  Laterality: Left; . NO PAST SURGERIES   HPI: [id number] yo male admitted 02/09/18 w/ CAP, legionella infection, probable gastritis (from NSAIDS) and progressive hypoxic resp failure. Developed severe ARDS requiring ETT 12/9-12/18, paralysis, and proning. PMH: intentional overdose. CXR = Diffuse right lung airspace disease and left lower lobe airspace opacity, worsened since last CXR  Subjective: pt alert, mentation seems to be improving each day Assessment / Plan / Recommendation CHL IP CLINICAL IMPRESSIONS 02/28/2018 Clinical Impression  Pt has improved strength and timing for swallow, with more oropharyngeal clearance and airway protection. He is better able to self-feed and has more oral containment before the swallow. He has mild lingual residue with solids that he clears spontaneously. In trying to clear the barium tablet, he had one instance of premature spillage with thin liquids via straw that was aspirated silently. When drinking thin liquids in isolation, he had occasionaly, trace penetration of thin liquids via straw that cleared easily with a cued throat clear. Recommend starting Dys 3 diet and thin liquids by cup. Education was provided about silent nature of aspiration and the importance of avoiding mixed consistencies for the time being. SLP will continue to follow for tolerance and readiness to advance. SLP Visit Diagnosis Dysphagia, oropharyngeal phase (R13.12) Attention and concentration deficit following -- Frontal lobe and executive function deficit following -- Impact on safety and function Mild aspiration risk   CHL IP TREATMENT RECOMMENDATION 02/28/2018 Treatment Recommendations Therapy as outlined in treatment plan below   Prognosis 02/28/2018 Prognosis for Safe Diet Advancement Good Barriers to Reach Goals -- Barriers/Prognosis Comment -- CHL IP DIET RECOMMENDATION 02/28/2018 SLP Diet Recommendations Dysphagia 3 (Mech soft) solids;Thin liquid Liquid Administration via Cup;No straw Medication  Administration Whole meds with puree Compensations Slow rate;Small sips/bites Postural Changes Seated upright at 90 degrees   CHL IP OTHER RECOMMENDATIONS 02/28/2018 Recommended Consults -- Oral Care Recommendations Oral care BID Other Recommendations --   CHL IP FOLLOW UP RECOMMENDATIONS 02/28/2018 Follow up Recommendations Inpatient Rehab   CHL IP FREQUENCY AND DURATION 02/28/2018 Speech Therapy Frequency (ACUTE ONLY) min 2x/week Treatment Duration 2 weeks      CHL IP ORAL PHASE 02/28/2018 Oral Phase Impaired Oral - Pudding Teaspoon --  Oral - Pudding Cup -- Oral - Honey Teaspoon NT Oral - Honey Cup -- Oral - Nectar Teaspoon NT Oral - Nectar Cup WFL Oral - Nectar Straw -- Oral - Thin Teaspoon NT Oral - Thin Cup WFL Oral - Thin Straw Premature spillage Oral - Puree Lingual/palatal residue;Delayed oral transit Oral - Mech Soft Lingual/palatal residue;Delayed oral transit Oral - Regular -- Oral - Multi-Consistency -- Oral - Pill Lingual/palatal residue;Delayed oral transit Oral Phase - Comment --  CHL IP PHARYNGEAL PHASE 02/28/2018 Pharyngeal Phase Impaired Pharyngeal- Pudding Teaspoon -- Pharyngeal -- Pharyngeal- Pudding Cup -- Pharyngeal -- Pharyngeal- Honey Teaspoon NT Pharyngeal -- Pharyngeal- Honey Cup -- Pharyngeal -- Pharyngeal- Nectar Teaspoon NT Pharyngeal -- Pharyngeal- Nectar Cup WFL Pharyngeal -- Pharyngeal- Nectar Straw -- Pharyngeal -- Pharyngeal- Thin Teaspoon NT Pharyngeal -- Pharyngeal- Thin Cup [facility] Pharyngeal Material does not enter airway Pharyngeal- Thin Straw Penetration/Aspiration before swallow Pharyngeal Material enters airway, passes BELOW cords without attempt by patient to eject out (silent aspiration);Material enters airway, remains ABOVE vocal cords and not ejected out Pharyngeal- Puree [facility] Pharyngeal Material does not enter airway Pharyngeal- Mechanical Soft WFL Pharyngeal -- Pharyngeal- Regular -- Pharyngeal -- Pharyngeal- Multi-consistency -- Pharyngeal -- Pharyngeal- Pill WFL Pharyngeal -- Pharyngeal Comment --  CHL IP CERVICAL ESOPHAGEAL PHASE 02/28/2018 Cervical Esophageal Phase WFL Pudding Teaspoon -- Pudding Cup -- Honey Teaspoon -- Honey Cup -- Nectar Teaspoon -- Nectar Cup -- Nectar Straw -- Thin Teaspoon -- Thin Cup -- Thin Straw -- Puree -- Mechanical Soft -- Regular -- Multi-consistency -- Pill -- Cervical Esophageal Comment -- Maxcine Ham 02/28/2018, 2:41 PM  Maxcine Ham, M.A. CCC-SLP Acute Rehabilitation Services Pager [phone] Office [phone]             Dg Swallowing Func-speech  Pathology  Result Date: 02/24/2018 Objective Swallowing Evaluation: Type of Study: MBS-Modified Barium Swallow Study  Patient Details Name: Roberto Knapp MRN: [id number] Date of Birth: February 11, 1971 Today's Date: 02/24/2018 Time: SLP Start Time (ACUTE ONLY): 1127 -SLP Stop Time (ACUTE ONLY): 1156 SLP Time Calculation (min) (ACUTE ONLY): 29 min Past Medical History: Past Medical History: Diagnosis Date . Drug overdose, intentional [facility])  Past Surgical History: Past Surgical History: Procedure Laterality Date . FASCIOTOMY  09/08/2011  Procedure: FASCIOTOMY;  Surgeon: Sherren Kerns, MD;  Location: [facility] OR;  Service: Vascular;  Laterality: Left; . FEMORAL-POPLITEAL BYPASS GRAFT  09/08/2011  Procedure: BYPASS GRAFT FEMORAL-POPLITEAL ARTERY;  Surgeon: Sherren Kerns, MD;  Location: [facility] OR;  Service: Vascular;  Laterality: Left; . NO PAST SURGERIES   HPI: 48 yo male admitted 02/09/18 w/ CAP, legionella infection, probable gastritis (from NSAIDS) and progressive hypoxic resp failure. Developed severe ARDS requiring ETT 12/9-12/18, paralysis, and proning. PMH: intentional overdose. CXR = Diffuse right lung airspace disease and left lower lobe airspace opacity, worsened since last CXR  Subjective: pt pleasant, confused, makes jokes when he does not seem to know an answer Assessment / Plan / Recommendation CHL IP CLINICAL IMPRESSIONS 02/24/2018 Clinical Impression Pt has mild, generalized  oropharyngeal weakness that allows for reduced lingual propulsion orally but also with reduced base of tongue retraction, hyolaryngeal movement, and epiglottic inversion. His oral clearance is adequate and he has only trace to mild amounts of vallecular residue. He has trace amounts of penetration during the swallow with all consistencies tested except for honey thick liquids by tsp. The amount of other consistencies penetrated is very small, but unfortunately he cannot clear them with a cued cough, and even during testing they begin to fall to the  true vocal folds without sensation. Suspect that across a meal tray, aspiration would be likely. Recommend SLP f/u for introduction of therapeutic trials of honey thick liquids by spoon and use of strengthening exercises (RMT?) to facilitate return to POs, with prognosis good given additional time post-extubation and improved strength of cough. SLP Visit Diagnosis Dysphagia, oropharyngeal phase (R13.12) Attention and concentration deficit following -- Frontal lobe and executive function deficit following -- Impact on safety and function Moderate aspiration risk   CHL IP TREATMENT RECOMMENDATION 02/24/2018 Treatment Recommendations Therapy as outlined in treatment plan below   Prognosis 02/24/2018 Prognosis for Safe Diet Advancement Good Barriers to Reach Goals Cognitive deficits Barriers/Prognosis Comment -- CHL IP DIET RECOMMENDATION 02/24/2018 SLP Diet Recommendations NPO;Alternative means - temporary Liquid Administration via -- Medication Administration Via alternative means Compensations -- Postural Changes --   CHL IP OTHER RECOMMENDATIONS 02/24/2018 Recommended Consults -- Oral Care Recommendations Oral care QID Other Recommendations --   CHL IP FOLLOW UP RECOMMENDATIONS 02/24/2018 Follow up Recommendations Skilled Nursing facility;LTACH   CHL IP FREQUENCY AND DURATION 02/24/2018 Speech Therapy Frequency (ACUTE ONLY) min 2x/week Treatment Duration 2 weeks      CHL IP ORAL PHASE 02/24/2018 Oral Phase Impaired Oral - Pudding Teaspoon -- Oral - Pudding Cup -- Oral - Honey Teaspoon Delayed oral transit;Weak lingual manipulation Oral - Honey Cup -- Oral - Nectar Teaspoon Delayed oral transit;Weak lingual manipulation Oral - Nectar Cup -- Oral - Nectar Straw -- Oral - Thin Teaspoon Weak lingual manipulation Oral - Thin Cup Weak lingual manipulation Oral - Thin Straw -- Oral - Puree Weak lingual manipulation;Delayed oral transit Oral - Mech Soft -- Oral - Regular -- Oral - Multi-Consistency -- Oral - Pill -- Oral  Phase - Comment --  CHL IP PHARYNGEAL PHASE 02/24/2018 Pharyngeal Phase Impaired Pharyngeal- Pudding Teaspoon -- Pharyngeal -- Pharyngeal- Pudding Cup -- Pharyngeal -- Pharyngeal- Honey Teaspoon Reduced epiglottic inversion;Reduced anterior laryngeal mobility;Reduced laryngeal elevation;Reduced tongue base retraction;Pharyngeal residue - valleculae Pharyngeal -- Pharyngeal- Honey Cup -- Pharyngeal -- Pharyngeal- Nectar Teaspoon Reduced epiglottic inversion;Reduced anterior laryngeal mobility;Reduced laryngeal elevation;Reduced tongue base retraction;Pharyngeal residue - valleculae;Penetration/Aspiration during swallow Pharyngeal Material enters airway, CONTACTS cords and not ejected out Pharyngeal- Nectar Cup -- Pharyngeal -- Pharyngeal- Nectar Straw -- Pharyngeal -- Pharyngeal- Thin Teaspoon Reduced epiglottic inversion;Reduced anterior laryngeal mobility;Reduced laryngeal elevation;Reduced tongue base retraction Pharyngeal -- Pharyngeal- Thin Cup Reduced epiglottic inversion;Reduced anterior laryngeal mobility;Reduced laryngeal elevation;Reduced tongue base retraction;Pharyngeal residue - valleculae;Penetration/Aspiration during swallow Pharyngeal Material enters airway, CONTACTS cords and not ejected out Pharyngeal- Thin Straw -- Pharyngeal -- Pharyngeal- Puree Reduced epiglottic inversion;Reduced anterior laryngeal mobility;Reduced laryngeal elevation;Reduced tongue base retraction;Pharyngeal residue - valleculae;Penetration/Aspiration during swallow Pharyngeal Material enters airway, remains ABOVE vocal cords and not ejected out Pharyngeal- Mechanical Soft -- Pharyngeal -- Pharyngeal- Regular -- Pharyngeal -- Pharyngeal- Multi-consistency -- Pharyngeal -- Pharyngeal- Pill -- Pharyngeal -- Pharyngeal Comment --  CHL IP CERVICAL ESOPHAGEAL PHASE 02/24/2018 Cervical Esophageal Phase WFL Pudding Teaspoon -- Pudding Cup -- Honey Teaspoon --  Honey Cup -- Nectar Teaspoon -- Nectar Cup -- Nectar Straw -- Thin Teaspoon  -- Thin Cup -- Thin Straw -- Puree -- Mechanical Soft -- Regular -- Multi-consistency -- Pill -- Cervical Esophageal Comment -- Maxcine Ham 02/24/2018, 1:23 PM  Maxcine Ham, M.A. CCC-SLP Acute Rehabilitation Services Pager [phone] Office [phone]             Vas [place] Upper Extremity Venous Duplex  Result Date: 02/24/2018 UPPER VENOUS STUDY  Indications: Pain Performing Technologist: Jeb Levering RDMS, RVT  Examination Guidelines: A complete evaluation includes B-mode imaging, spectral Doppler, color Doppler, and power Doppler as needed of all accessible portions of each vessel. Bilateral testing is considered an integral part of a complete examination. Limited examinations for reoccurring indications may be performed as noted.  Right Findings: +----------+------------+----------+---------+-----------+-------+ RIGHT     CompressiblePropertiesPhasicitySpontaneousSummary +----------+------------+----------+---------+-----------+-------+ Subclavian                         Yes       Yes            +----------+------------+----------+---------+-----------+-------+  Left Findings: +----------+------------+----------+---------+-----------+-------+ LEFT      CompressiblePropertiesPhasicitySpontaneousSummary +----------+------------+----------+---------+-----------+-------+ IJV           Full                 Yes       Yes            +----------+------------+----------+---------+-----------+-------+ Subclavian    Full                 Yes       Yes            +----------+------------+----------+---------+-----------+-------+ Axillary      Full                 Yes       Yes            +----------+------------+----------+---------+-----------+-------+ Brachial      Full                 Yes       Yes            +----------+------------+----------+---------+-----------+-------+ Radial        Full                                           +----------+------------+----------+---------+-----------+-------+ Ulnar         Full                                          +----------+------------+----------+---------+-----------+-------+ Cephalic      Full                                          +----------+------------+----------+---------+-----------+-------+ Basilic       None                                   Acute  +----------+------------+----------+---------+-----------+-------+  Summary:  Right: No evidence of thrombosis in the subclavian.  Left: No evidence of deep vein thrombosis in the upper extremity. Findings consistent  with acute superficial vein thrombosis involving the left basilic vein.  *See table(s) above for measurements and observations.  Diagnosing physician: Gretta Began MD Electronically signed by Gretta Began MD on 02/24/2018 at 1:44:18 PM.    Final     Microbiology: No results found for this or any previous visit (from the past 240 hour(s)).   Labs: CBC: Recent Labs  Lab 02/28/18 0644 03/01/18 0758  03/03/18 0553 03/04/18 0500 03/04/18 1206 03/05/18 0445 03/06/18 0500  WBC 10.1 8.6  --   --   --  11.0* 11.4* 9.6  NEUTROABS  --   --   --   --   --   --   --  6.0  HGB 7.6* 7.7*   < > 8.7* 8.8* 8.8* 8.8* 8.9*  HCT 23.7* 24.3*   < > 28.3* 27.9* 28.9* 28.7* 29.4*  MCV 92.2 92.4  --   --   --  94.8 94.7 95.1  PLT PLATELET CLUMPS NOTED ON SMEAR, UNABLE TO ESTIMATE 175  --   --   --  307 263 295   < > = values in this interval not displayed.   Basic Metabolic Panel: Recent Labs  Lab 03/02/18 0500 03/03/18 0553 03/04/18 0500 03/05/18 0445 03/06/18 0500  NA 137 137 140 138 138  K 3.8 4.1 4.4 4.0 4.1  CL 100 103 109 104 105  CO2 27 23 21* 28 25  GLUCOSE 97 98 95 96 94  BUN 29* 45* 46* 24* 32*  CREATININE 3.66* 4.19* 4.27* 2.63* 2.99*  CALCIUM 8.4* 8.6* 8.6* 8.5* 8.7*  MG  --   --   --   --  1.5*  PHOS 4.7* 5.2* 5.6* 4.6 5.1*   Liver Function Tests: Recent Labs  Lab 03/02/18 0500  03/03/18 0553 03/04/18 0500 03/05/18 0445 03/06/18 0500  ALBUMIN 2.0* 2.0* 2.1* 2.1* 2.2*   No results for input(s): LIPASE, AMYLASE in the last 168 hours. No results for input(s): AMMONIA in the last 168 hours. Cardiac Enzymes: No results for input(s): CKTOTAL, CKMB, CKMBINDEX, TROPONINI in the last 168 hours. BNP (last 3 results) No results for input(s): BNP in the last 8760 hours. CBG: Recent Labs  Lab 02/27/18 1947 02/27/18 2317 02/28/18 0742 02/28/18 1252 03/01/18 0743  GLUCAP 101* 95 103* 116* 88   Time spent: 35 minutes  Signed:  Lynden Oxford  [facility] 03/06/2018 , 2:31 PM

## 2018-03-06 NOTE — Consult Note (Signed)
WOC Nurse wound consult note Patient receiving care in [facility] 2W28.  Patient is dressed, ready for discharge home.  He states that when the urinary catheter was removed, there was a dried "piece of blood" that came off the end of the penis at the urethra, and there is NO wound. Helmut Muster, RN, MSN, CWOCN, CNS-BC, pager [phone]

## 2018-03-06 NOTE — Progress Notes (Signed)
  Speech Language Pathology Treatment: Dysphagia  Patient Details Name: Roberto Knapp MRN: [id number] DOB: [birth year] Today's Date: 03/06/2018 Time: 5726-2035 SLP Time Calculation (min) (ACUTE ONLY): 12 min  Assessment / Plan / Recommendation Clinical Impression  Pt was not able to sit upright s/p line removal, therefore treatment focused on education and review of strategies prior to d/c planned for this morning. Pt and wife describe good tolerance of PO intake so far, and pt verbalized safe swallowing strategies with Min A from his wife. Education was provided about implementation of recommended diet/precautions upon return home. We also discussed his cognitive status, and that although there have clearly been improvements, it would be safest to use supervision upon return home and monitoring for his abilities. Discussed with MD recommendation for OP SLP f/u for dysphagia and assessment of higher level cognition. Pt and wife have no questions at this time and are eager for his return home.   HPI HPI: 48 yo male admitted 02/09/18 w/ CAP, legionella infection, probable gastritis (from NSAIDS) and progressive hypoxic resp failure. Developed severe ARDS requiring ETT 12/9-12/18, paralysis, and proning. PMH: intentional overdose. CXR = Diffuse right lung airspace disease and left lower lobe airspace opacity, worsened since last CXR      SLP Plan  Continue with current plan of care       Recommendations  Diet recommendations: Dysphagia 3 (mechanical soft);Thin liquid Liquids provided via: Cup;No straw Medication Administration: Whole meds with puree Supervision: Patient able to self feed Compensations: Slow rate;Small sips/bites Postural Changes and/or Swallow Maneuvers: Seated upright 90 degrees                Oral Care Recommendations: Oral care BID Follow up Recommendations: Outpatient SLP SLP Visit Diagnosis: Dysphagia, oropharyngeal phase (R13.12) Plan: Continue with current plan of  care       GO                Maxcine Ham 03/06/2018, 11:01 AM  Maxcine Ham, M.A. CCC-SLP Acute Herbalist [phone] Office [phone]

## 2018-03-06 NOTE — Progress Notes (Signed)
Occupational Therapy Treatment Patient Details Name: Roberto Knapp MRN: [id number] DOB: 1970-12-08 Today's Date: 03/06/2018    History of present illness Pt is a 48 y.o. M with no significant PMH who was admitted with fever, aches, SOB, abdominal pain, N/V, BRBPR who was found to have CAP, legionella infection, and progressive hypoxic failure. Developed severe ARDS requiring paralysis and proning. Intubated 12/9-12/18   OT comments  Pt progressing towards acute OT goals. Focus of session was strategies for energy conservation and building activity tolerance during ADLs. Discussed having oversight for medication management at least initially. Also recommended talking with PCP/MD when he is ready to think about timeline for returning to work. 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 He reports, "I know I'm not back 100% yet." Pt excited to be d/cing home today. Spouse present throughout session. D/c plan updated to OP OT.    Follow Up Recommendations  Outpatient OT;Supervision/Assistance - 24 hour    Equipment Recommendations  None recommended by OT    Recommendations for Other Services      Precautions / Restrictions Precautions Precautions: Fall Restrictions Weight Bearing Restrictions: No       Mobility Bed Mobility Overal bed mobility: Modified Independent                Transfers Overall transfer level: Needs assistance Equipment used: None Transfers: Sit to/from Stand Sit to Stand: Supervision         General transfer comment: somewhat impulsive to stand and walk (baseline?)    Balance Overall balance assessment: Needs assistance Sitting-balance support: Feet supported;Bilateral upper extremity supported Sitting balance-Leahy Scale: Good Sitting balance - Comments: able to tie shoes sitting EOB   Standing balance support: No upper extremity supported;During functional activity Standing balance-Leahy Scale: Fair                             ADL either performed or assessed with clinical judgement   ADL Overall ADL's : Needs assistance/impaired                                 Tub/ Shower Transfer: Supervision/safety;Ambulation   Functional mobility during ADLs: Supervision/safety General ADL Comments: Pt completed household distance functional mobility, tub transfer. Educated on energy conservation principles as well as building activity tolerance. Discussed spouse providing at least oversight for medication management and talking with MD when ready to think about timeline for returning to work. 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     Vision   Vision Assessment?: No apparent visual deficits   Perception     Praxis      Cognition Arousal/Alertness: Awake/alert Behavior During Therapy: WFL for tasks assessed/performed Overall Cognitive Status: Impaired/Different from baseline                                 General Comments: cognition improving, "I know I'm not back 100% yet."        Exercises     Shoulder Instructions       General Comments      Pertinent Vitals/ Pain       Pain Assessment: No/denies pain  Home Living  Prior Functioning/Environment              Frequency  Min 2X/week        Progress Toward Goals  OT Goals(current goals can now be found in the care plan section)  Progress towards OT goals: Progressing toward goals  Acute Rehab OT Goals Patient Stated Goal: to get stronger OT Goal Formulation: With patient Time For Goal Achievement: 03/16/18 Potential to Achieve Goals: Good ADL Goals Pt Will Perform Grooming: with modified independence;standing Pt Will Perform Lower Body Dressing: with modified independence;sit to/from stand Pt Will Perform Tub/Shower Transfer: Tub transfer;with modified independence;3 in 1;ambulating Pt/caregiver will  Perform Home Exercise Program: Increased strength;Both right and left upper extremity;With written HEP provided;With theraband Additional ADL Goal #1: Patient will recall and complete 3 step task in moderately distracting enviorment with supervision.  Plan Discharge plan remains appropriate    Co-evaluation                 AM-PAC OT "6 Clicks" Daily Activity     Outcome Measure   Help from another person eating meals?: None Help from another person taking care of personal grooming?: None Help from another person toileting, which includes using toliet, bedpan, or urinal?: None Help from another person bathing (including washing, rinsing, drying)?: A Little Help from another person to put on and taking off regular upper body clothing?: None Help from another person to put on and taking off regular lower body clothing?: None 6 Click Score: 23    End of Session    OT Visit Diagnosis: Unsteadiness on feet (R26.81);Muscle weakness (generalized) (M62.81);Other symptoms and signs involving cognitive function   Activity Tolerance Patient tolerated treatment well   Patient Left with call bell/phone within reach;with family/visitor present(sitting EOB, spouse present)   Nurse Communication          Time: 3545-6256 OT Time Calculation (min): 13 min  Charges: OT General Charges $OT Visit: 1 Visit OT Treatments $Self Care/Home Management : 8-22 mins  Raynald Kemp, OT Acute Rehabilitation Services Pager: [phone] Office: [phone]    Pilar Grammes 03/06/2018, 9:23 AM

## 2018-03-06 NOTE — Discharge Instructions (Signed)
Dysphagia Eating Plan, Bite Size Food °This diet plan is for people with moderate swallowing problems who have transitioned from pureed and minced foods. Bite size foods are soft and cut into small chunks so that they can be swallowed safely. On this eating plan, you may be instructed to drink liquids that are thickened. °Work with your health care provider and your diet and nutrition specialist (dietitian) to make sure that you are following the diet safely and getting all the nutrients you need. °What are tips for following this plan? °General guidelines for foods ° °· You may eat foods that are tender, soft, and moist. °· Always test food texture before taking a bite. Poke food with a fork or spoon to make sure it is tender. °· Food should be easy to cut and shew. Avoid large pieces of food that require a lot of chewing. °· Take small bites. Each bite should be smaller than your thumb nail (about 15mm by 15 mm). °· If you were on pureed and minced food diet plans, you may eat any of the foods included in those diets. °· Avoid foods that are very dry, hard, sticky, chewy, coarse, or crunchy. °· If instructed by your health care provider, thicken liquids. Follow your health care provider's instructions for what products to use, how to do this, and to what thickness. °? Your health care provider may recommend using a commercial thickener, rice cereal, or potato flakes. Ask your health care provider to recommend thickeners. °? Thickened liquids are usually a “pudding-like” consistency, or they may be as thick as honey or thick enough to eat with a spoon. °Cooking °· To moisten foods, you may add liquids while you are blending, mashing, or grinding your foods to the right consistency. These liquids include gravies, sauces, vegetable or fruit juice, milk, half and half, or water. °· Strain extra liquid from foods before eating. °· Reheat foods slowly to prevent a tough crust from forming. °· Prepare foods in  advance. °Meal planning °· Eat a variety of foods to get all the nutrients you need. °· Some foods may be tolerated better than others. Work with your dietitian to identify which foods are safest for you to eat. °· Follow your meal plan as told by your dietitian. °What foods are allowed? °Grains °Moist breads without nuts or seeds. Biscuits, muffins, pancakes, and waffles that are well-moistened with syrup, jelly, margarine, or butter. Cooked cereals. Moist bread stuffing. Moist rice. Well-moistened cold cereal with small chunks. Well-cooked pasta, noodles, rice, and bread dressing in small pieces and thick sauce. Soft dumplings or spaetzle in small pieces and butter or gravy. °Vegetables °Soft, well-cooked vegetables in small pieces. Soft-cooked, mashed potatoes. Thickened vegetable juice. °Fruits °Canned or cooked fruits that are soft or moist and do not have skin or seeds. Fresh, soft bananas. Thickened fruit juices. °Meat and other protein foods °Tender, moist meats or poultry in small pieces. Moist meatballs or meatloaf. Fish without bones. Eggs or egg substitutes in small pieces. Tofu. Tempeh and meat alternatives in small pieces. Well-cooked, tender beans, peas, baked beans, and other legumes. °Dairy °Thickened milk. Cream cheese. Yogurt. Cottage cheese. Sour cream. Small pieces of soft cheese. °Fats and oils °Butter. Oils. Margarine. Mayonnaise. Gravy. Spreads. °Sweets and desserts °Soft, smooth, moist desserts. Pudding. Custard. Moist cakes. Jam. Jelly. Honey. Preserves. Ask your health care provider whether you can have frozen desserts. °Seasoning and other foods °All seasonings and sweeteners. All sauces with small chunks. Prepared tuna, egg, or chicken   salad without raw fruits or vegetables. Moist casseroles with small, tender pieces of meat. Soups with tender meat. What foods are not allowed? Grains Coarse or dry cereals. Dry breads. Toast. Crackers. Tough, crusty breads, such as [place] bread and  baguettes. Dry pancakes, waffles, and muffins. Sticky rice. Dry bread stuffing. Granola. Popcorn. Chips. Vegetables All raw vegetables. Cooked corn. Rubbery or stiff cooked vegetables. Stringy vegetables, such as celery. Tough, crisp fried potatoes. Potato skins. Fruits Hard, crunchy, stringy, high-pulp, and juicy raw fruits such as apples, pineapple, papaya, and watermelon. Small, round fruits, such as grapes. Dried fruit and fruit leather. Meat and other protein foods Large pieces of meat. Dry, tough meats, such as bacon, sausage, and hot dogs. Chicken, [place], or fish with skin and bones. Crunchy peanut butter. Nuts. Seeds. Nut and seed butters. Dairy Yogurt with nuts, seeds, or large chunks. Large chunks of cheese. Frozen desserts and milk consistency not allowed by your dietitian. Sweets and desserts Dry cakes. Chewy or dry cookies. Any desserts with nuts, seeds, dry fruits, coconut, pineapple, or anything dry, sticky, or hard. Chewy caramel. Licorice. Taffy-type candies. Ask your health care provider whether you can have frozen desserts. Seasoning and other foods Soups with tough or large chunks of meats, poultry, or vegetables. Corn or clam chowder. Smoothies with large chunks of fruit. Summary  Bite size foods can be helpful for people with moderate swallowing problems.  On the dysphagia eating plan, you may eat foods that are soft, moist, and cut into pieces smaller than 15mm by 15mm.  You may be instructed to thicken liquids. Follow your health care provider's instructions about how to do this and to what consistency. This information is not intended to replace advice given to you by your health care provider. Make sure you discuss any questions you have with your health care provider. Document Released: 02/19/2005 Document Revised: 06/01/2016 Document Reviewed: 06/01/2016 Elsevier Interactive Patient Education  2019 [employer]   Food Basics for Chronic Kidney Disease When  your kidneys are not working well, they cannot remove waste and excess substances from your blood as effectively as they did before. This can lead to a buildup and imbalance of these substances, which can worsen kidney damage and affect how your body functions. Certain foods lead to a buildup of these substances in the body. By changing your diet as recommended by your diet and nutrition specialist (dietitian) or health care provider, you could help prevent further kidney damage and delay or prevent the need for dialysis. What are tips for following this plan? General instructions   Work with your health care provider and dietitian to develop a meal plan that is right for you. Foods you can eat, limit, or avoid will be different for each person depending on the stage of kidney disease and any other existing health conditions.  Talk with your health care provider about whether you should take a vitamin and mineral supplement.  Use standard measuring cups and spoons to measure servings of foods. Use a kitchen scale to measure portions of protein foods.  If directed by your health care provider, avoid drinking too much fluid. Measure and count all liquids, including water, ice, soups, flavored gelatin, and frozen desserts such as popsicles or ice cream. Reading food labels  Check the amount of sodium in foods. Choose foods that have less than 300 milligrams (mg) per serving.  Check the ingredient list for phosphorus or potassium-based additives or preservatives.  Check the amount of saturated and trans  fat. Limit or avoid these fats as told by your dietitian. Shopping  Avoid buying foods that are: ? Processed, frozen, or prepackaged. ? Calcium-enriched or fortified.  Do not buy foods that have salt or sodium listed among the first five ingredients.  Do not buy canned vegetables. Cooking  Replace animal proteins, such as meat, fish, eggs, or dairy, with plant proteins from beans, nuts, and  soy. ? Use soy milk instead of cow's milk. ? Add beans or tofu to soups, casseroles, or pasta dishes instead of meat.  Soak vegetables, such as potatoes, before cooking to reduce potassium. To do this: ? Peel and cut into small pieces. ? Soak in warm water for at least 2 hours. For every 1 cup of vegetables, use 10 cups of water. ? Drain and rinse with warm water. ? Boil for at least 5 minutes. Meal planning  Limit the amount of protein from plant and animal sources you eat each day.  Do not add salt to food when cooking or before eating.  Eat meals and snacks at around the same time each day. If you have diabetes:  If you have diabetes (diabetes mellitus) and chronic kidney disease, it is important to keep your blood glucose in the target range recommended by your health care provider. Follow your diabetes management plan. This may include: ? Checking your blood glucose regularly. ? Taking oral medicines, insulin, or both. ? Exercising for at least 30 minutes on 5 or more days each week, or as told by your health care provider. ? Tracking how many servings of carbohydrates you eat at each meal.  You may be given specific guidelines on how much of certain foods and nutrients you may eat, depending on your stage of kidney disease and whether you have high blood pressure (hypertension). Follow your meal plan as told by your dietitian. What nutrients should be limited? The items listed are not a complete list. Talk with your dietitian about what dietary choices are best for you. Potassium Potassium affects how steadily your heart beats. If too much potassium builds up in your blood, it can cause an irregular heartbeat or even a heart attack. You may need to eat less potassium, depending on your blood potassium levels and the stage of kidney disease. Talk to your dietitian about how much potassium you may have each day. You may need to limit or avoid foods that are high in potassium, such  as:  Milk and soy milk.  Fruits, such as bananas, papaya, apricots, nectarines, melon, prunes, raisins, kiwi, and oranges.  Vegetables, such as potatoes, sweet potatoes, yams, tomatoes, leafy greens, beets, okra, avocado, pumpkin, and winter squash.  White and lima beans. Phosphorus Phosphorus is a mineral found in your bones. A balance between calcium and phosphorous is needed to build and maintain healthy bones. Too much phosphorus pulls calcium from your bones. This can make your bones weak and more likely to break. Too much phosphorus can also make your skin itch. You may need to eat less phosphorus depending on your blood phosphorus levels and the stage of kidney disease. Talk to your dietitian about how much potassium you may have each day. You may need to take medicine to lower your blood phosphorus levels if diet changes do not help. You may need to limit or avoid foods that are high in phosphorus, such as:  Milk and dairy products.  Dried beans and peas.  Tofu, soy milk, and other soy-based meat replacements.  Colas.  Nuts and peanut butter.  Meat, poultry, and fish.  Bran cereals and oatmeals. Protein Protein helps you to make and keep muscle. It also helps in the repair of your bodys cells and tissues. One of the natural breakdown products of protein is a waste product called urea. When your kidneys are not working properly, they cannot remove wastes, such as urea, like they did before you developed chronic kidney disease. Reducing how much protein you eat can help prevent a buildup of urea in your blood. Depending on your stage of kidney disease, you may need to limit foods that are high in protein. Sources of animal protein include:  Meat (all types).  Fish and seafood.  Poultry.  Eggs.  Dairy. Other protein foods include:  Beans and legumes.  Nuts and nut butter.  Soy and tofu. Sodium Sodium, which is found in salt, helps maintain a healthy balance of  fluids in your body. Too much sodium can increase your blood pressure and have a negative effect on the function of your heart and lungs. Too much sodium can also cause your body to retain too much fluid, making your kidneys work harder. Most people should have less than 2,300 milligrams (mg) of sodium each day. If you have hypertension, you may need to limit your sodium to 1,500 mg each day. Talk to your dietitian about how much sodium you may have each day. You may need to limit or avoid foods that are high in sodium, such as:  Salt seasonings.  Soy sauce.  Cured and processed meats.  Salted crackers and snack foods.  Fast food.  Canned soups and most canned foods.  Pickled foods.  Vegetable juice.  Boxed mixes or ready-to-eat boxed meals and side dishes.  Bottled dressings, sauces, and marinades. Summary  Chronic kidney disease can lead to a buildup and imbalance of waste and excess substances in the body. Certain foods lead to a buildup of these substances. By adjusting your intake of these foods, you could help prevent more kidney damage and delay or prevent the need for dialysis.  Food adjustments are different for each person with chronic kidney disease. Work with a dietitian to set up nutrient goals and a meal plan that is right for you.  If you have diabetes and chronic kidney disease, it is important to keep your blood glucose in the target range recommended by your health care provider. This information is not intended to replace advice given to you by your health care provider. Make sure you discuss any questions you have with your health care provider. Document Released: 05/12/2002 Document Revised: 02/15/2016 Document Reviewed: 02/15/2016 Elsevier Interactive Patient Education  2019 [employer].

## 2018-03-06 NOTE — Plan of Care (Signed)
  Problem: Education: Goal: Knowledge of General Education information will improve Description Including pain rating scale, medication(s)/side effects and non-pharmacologic comfort measures Outcome: Progressing   Problem: Health Behavior/Discharge Planning: Goal: Ability to manage health-related needs will improve Outcome: Progressing   Problem: Clinical Measurements: Goal: Ability to maintain clinical measurements within normal limits will improve Outcome: Progressing   Problem: Activity: Goal: Risk for activity intolerance will decrease Outcome: Progressing   Problem: Clinical Measurements: Goal: Respiratory complications will improve Outcome: Progressing   Problem: Nutrition: Goal: Adequate nutrition will be maintained Outcome: Progressing

## 2018-03-06 NOTE — Progress Notes (Signed)
[facility] ROUNDING NOTE   Subjective:   HD 12/31 tol well.  UOP 1500 yesterday.  No complaints this AM except wants to go home. Voiding well without foley.   Review of systems:   Denies shortness of breath or cough  Denies chest pain Denies nausea or vomiting    Objective:  Vital signs in last 24 hours:  Temp:  [98.1 F (36.7 C)-98.5 F (36.9 C)] 98.5 F (36.9 C) (01/01 2348) Pulse Rate:  [71-86] 75 (01/01 2348) Resp:  [18] 18 (01/01 2348) BP: (126-138)/(86-91) 133/91 (01/01 2348) SpO2:  [100 %] 100 % (01/01 2348) Weight:  [60.1 kg] 60.1 kg (01/02 0600)  Weight change: -0.274 kg Filed Weights   03/04/18 1155 03/04/18 1532 03/06/18 0600  Weight: 60.6 kg 59.9 kg 60.1 kg    Intake/Output: I/O last 3 completed shifts: In: 10 [I.V.:10] Out: 1500 [Urine:1500]   Intake/Output this shift:  No intake/output data recorded.  General adult male in bed in no acute distress HEENT normocephalic atraumatic extraocular movements intact sclera anicteric Neck supple trachea midline Lungs clear and unlabored Heart regular rate and rhythm no rubs or gallops appreciated Abdomen soft nontender nondistended Extremities no lower extremity edema  Psych normal mood and affect Access: RIJ vascath in place   Basic Metabolic Panel: Recent Labs  Lab 03/02/18 0500 03/03/18 0553 03/04/18 0500 03/05/18 0445 03/06/18 0500  NA 137 137 140 138 138  K 3.8 4.1 4.4 4.0 4.1  CL 100 103 109 104 105  CO2 27 23 21* 28 25  GLUCOSE 97 98 95 96 94  BUN 29* 45* 46* 24* 32*  CREATININE 3.66* 4.19* 4.27* 2.63* 2.99*  CALCIUM 8.4* 8.6* 8.6* 8.5* 8.7*  MG  --   --   --   --  1.5*  PHOS 4.7* 5.2* 5.6* 4.6 5.1*    Liver Function Tests: Recent Labs  Lab 03/02/18 0500 03/03/18 0553 03/04/18 0500 03/05/18 0445 03/06/18 0500  ALBUMIN 2.0* 2.0* 2.1* 2.1* 2.2*    CBC: Recent Labs  Lab 02/28/18 0644 03/01/18 0758  03/03/18 0553 03/04/18 0500 03/04/18 1206 03/05/18 0445  03/06/18 0500  WBC 10.1 8.6  --   --   --  11.0* 11.4* 9.6  NEUTROABS  --   --   --   --   --   --   --  6.0  HGB 7.6* 7.7*   < > 8.7* 8.8* 8.8* 8.8* 8.9*  HCT 23.7* 24.3*   < > 28.3* 27.9* 28.9* 28.7* 29.4*  MCV 92.2 92.4  --   --   --  94.8 94.7 95.1  PLT PLATELET CLUMPS NOTED ON SMEAR, UNABLE TO ESTIMATE 175  --   --   --  307 263 295   < > = values in this interval not displayed.    Medications:   . sodium chloride Stopped (02/21/18 1536)   . Chlorhexidine Gluconate Cloth  6 each Topical Q0600  . darbepoetin (ARANESP) injection - NON-DIALYSIS  100 mcg Subcutaneous Q Tue-1800  . guaiFENesin  10 mL Oral TID  . heparin injection (subcutaneous)  5,000 Units Subcutaneous Q8H  . Influenza vac split quadrivalent PF  0.5 mL Intramuscular Tomorrow-1000  . pantoprazole  40 mg Oral Daily  . sodium chloride flush  10-40 mL Intracatheter Q12H   sodium chloride, acetaminophen, oxyCODONE **AND** acetaminophen, levalbuterol, [DISCONTINUED] ondansetron **OR** ondansetron (ZOFRAN) IV, sodium chloride flush, traZODone  Assessment/ Plan:   Acute kidney injury in setting of ATN recent history of  nonsteroidal anti-inflammatory drugs and IV contrast was started on CRRT 02/12/2018 to 02/17/2018 and started on intermittent hemodialysis 12/18, with last 2 treatments 12/28 and 12/31.    - Vas-Cath is currently in place but this can be removed today.  - It is ok for him to be discharge from my perspective.  I will see him in clinic next week for follow up. Visit scheduled for 03/11/18 at 4pm.  [address]  Ph [phone].  Please provide him this information with his discharge paperwork.   Anemia on darbepoetin 100 q2 wks, started 12/17. Also has rec'd pRBC.  Stable in the 8s currently.  Acute hypoxic respiratory failure secondary to Legionella ARDS with hx of septic shock.  Now on room air.  S/p Levaquin   Encephalopathy improved.  Conversant though is sometimes confused per report (was ok today)  Hx  Atrial fibrillation with rapid ventricular rate.  Now in sinus rhythm. K ok, Mag ok.    LOS: 25 Tyler PitaLindsay A Jodey Burbano 03/06/2018  7:26 AM

## 2018-03-06 NOTE — Care Management Note (Signed)
Case Management Note Initial CM Note was documented by Elenor Quinones RNCM.  Patient Details  Name: Roberto Knapp MRN: [id number] Date of Birth: 1970-11-23  Subjective/Objective: Pt admitted with gastritis - lead to hypoxic resp failure                 Action/Plan:  PTA independent from home with wife.     Expected Discharge Date:  03/06/18               Expected Discharge Plan:  OP Rehab(From home with wife)  In-House Referral:  Clinical Social Work  Discharge planning Services  CM Consult  Post Acute Care Choice:  NA Choice offered to:  NA  DME Arranged:  N/A DME Agency:  NA  HH Arranged:  NA HH Agency:  NA  Status of Service:  Completed, signed off  If discussed at [employer] of Stay Meetings, dates discussed:    Additional Comments: 03/06/18 @ 1032-Roberto Knapp RNCM-CM met with patient/spouse to discuss PT/OT recommendations for outpatient physical and occupational therapy. CM discussed perferences with [facility] selected; CM placed referral in Epic; AVS updated. Patients spouse will provide transportation home. No further needs from CM.   Midge Minium RN, BSN, NCM-BC, ACM-RN [phone] 03/06/2018, 10:31 AM

## 2019-08-10 ENCOUNTER — Emergency Department (HOSPITAL_COMMUNITY): Payer: Worker's Compensation

## 2019-08-10 ENCOUNTER — Encounter (HOSPITAL_COMMUNITY): Payer: Self-pay | Admitting: Emergency Medicine

## 2019-08-10 ENCOUNTER — Other Ambulatory Visit: Payer: Self-pay

## 2019-08-10 ENCOUNTER — Emergency Department (HOSPITAL_COMMUNITY)
Admission: EM | Admit: 2019-08-10 | Discharge: 2019-08-10 | Disposition: A | Discharge status: Home / Self Care | Payer: Worker's Compensation | Attending: Emergency Medicine | Admitting: Emergency Medicine

## 2019-08-10 DIAGNOSIS — Y929 Unspecified place or not applicable: Secondary | ICD-10-CM | POA: Diagnosis not present

## 2019-08-10 DIAGNOSIS — W2209XA Striking against other stationary object, initial encounter: Secondary | ICD-10-CM | POA: Diagnosis not present

## 2019-08-10 DIAGNOSIS — Y990 Civilian activity done for income or pay: Secondary | ICD-10-CM | POA: Insufficient documentation

## 2019-08-10 DIAGNOSIS — S62664B Nondisplaced fracture of distal phalanx of right ring finger, initial encounter for open fracture: Secondary | ICD-10-CM | POA: Diagnosis not present

## 2019-08-10 DIAGNOSIS — Y939 Activity, unspecified: Secondary | ICD-10-CM | POA: Diagnosis not present

## 2019-08-10 DIAGNOSIS — S060X0A Concussion without loss of consciousness, initial encounter: Secondary | ICD-10-CM | POA: Insufficient documentation

## 2019-08-10 DIAGNOSIS — S63642A Sprain of metacarpophalangeal joint of left thumb, initial encounter: Secondary | ICD-10-CM | POA: Diagnosis not present

## 2019-08-10 DIAGNOSIS — T07XXXA Unspecified multiple injuries, initial encounter: Secondary | ICD-10-CM | POA: Diagnosis present

## 2019-08-10 DIAGNOSIS — F17210 Nicotine dependence, cigarettes, uncomplicated: Secondary | ICD-10-CM | POA: Insufficient documentation

## 2019-08-10 LAB — CBC WITH DIFFERENTIAL/PLATELET
Abs Immature Granulocytes: 0.03 10*3/uL (ref 0.00–0.07)
Basophils Absolute: 0.1 10*3/uL (ref 0.0–0.1)
Basophils Relative: 1 %
Eosinophils Absolute: 0.1 10*3/uL (ref 0.0–0.5)
Eosinophils Relative: 1 %
HCT: 44.1 % (ref 39.0–52.0)
Hemoglobin: 14.5 g/dL (ref 13.0–17.0)
Immature Granulocytes: 0 %
Lymphocytes Relative: 20 %
Lymphs Abs: 2.1 10*3/uL (ref 0.7–4.0)
MCH: 30.7 pg (ref 26.0–34.0)
MCHC: 32.9 g/dL (ref 30.0–36.0)
MCV: 93.4 fL (ref 80.0–100.0)
Monocytes Absolute: 0.5 10*3/uL (ref 0.1–1.0)
Monocytes Relative: 4 %
Neutro Abs: 8 10*3/uL — ABNORMAL HIGH (ref 1.7–7.7)
Neutrophils Relative %: 74 %
Platelets: 340 10*3/uL (ref 150–400)
RBC: 4.72 MIL/uL (ref 4.22–5.81)
RDW: 12.8 % (ref 11.5–15.5)
WBC: 10.7 10*3/uL — ABNORMAL HIGH (ref 4.0–10.5)
nRBC: 0 % (ref 0.0–0.2)

## 2019-08-10 LAB — BASIC METABOLIC PANEL
Anion gap: 8 (ref 5–15)
BUN: 14 mg/dL (ref 6–20)
CO2: 24 mmol/L (ref 22–32)
Calcium: 9.3 mg/dL (ref 8.9–10.3)
Chloride: 104 mmol/L (ref 98–111)
Creatinine, Ser: 1.01 mg/dL (ref 0.61–1.24)
GFR calc Af Amer: >60 mL/min (ref >60)
GFR calc non Af Amer: >60 mL/min (ref >60)
Glucose, Bld: 128 mg/dL — ABNORMAL HIGH (ref 70–99)
Potassium: 3.7 mmol/L (ref 3.5–5.1)
Sodium: 136 mmol/L (ref 135–145)

## 2019-08-10 IMAGING — DX DG HAND COMPLETE 3+V*L*
3 series · 3 of 3 positions shown · non-contrast
Comparison: None.

CLINICAL DATA: Laceration to the left thumb, initial encounter

EXAM:
LEFT HAND - COMPLETE 3+ VIEW

[hand obl]
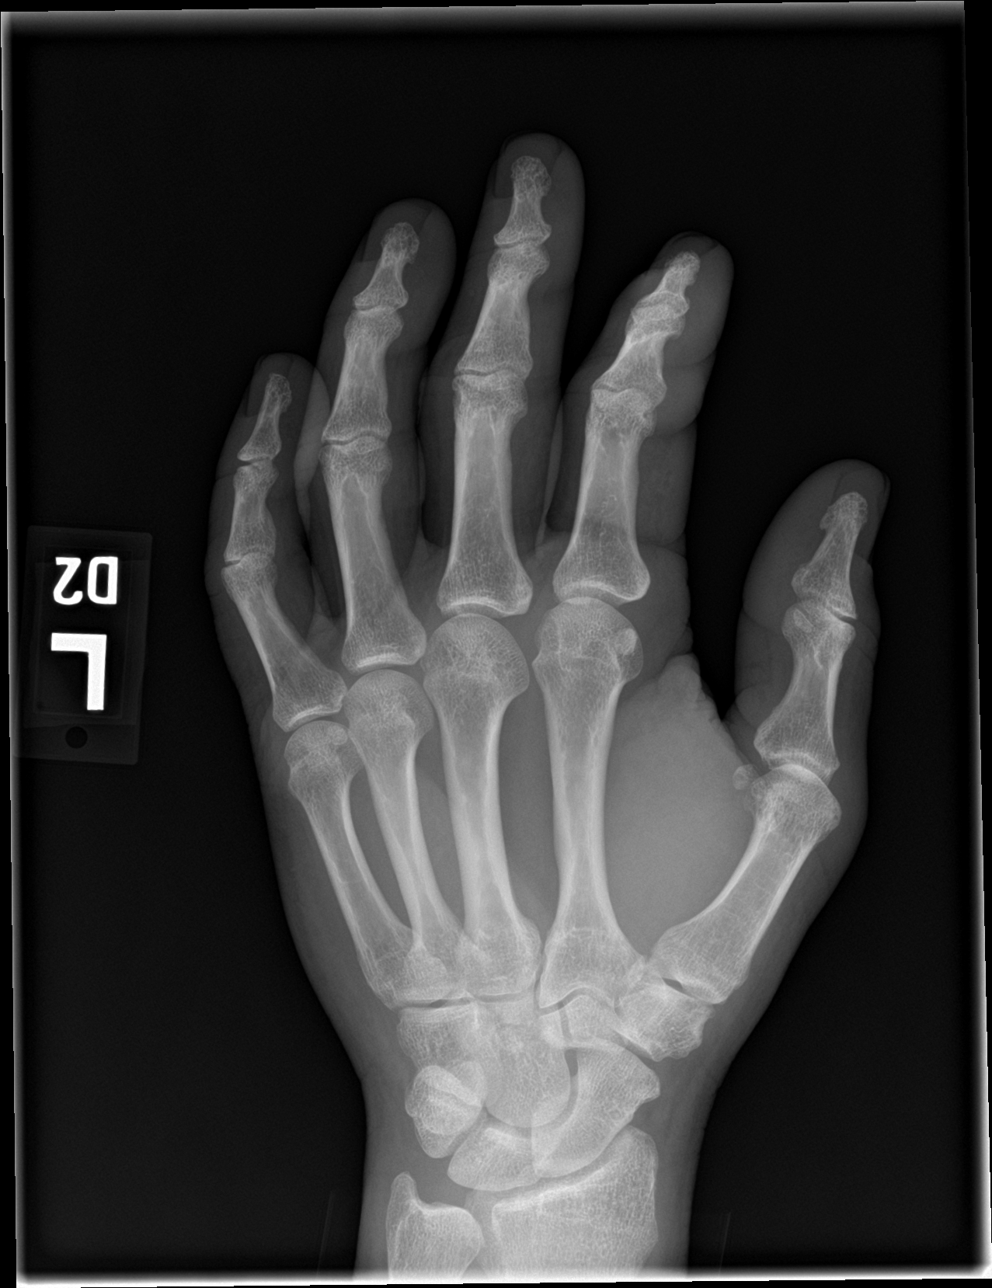

[hand lat]
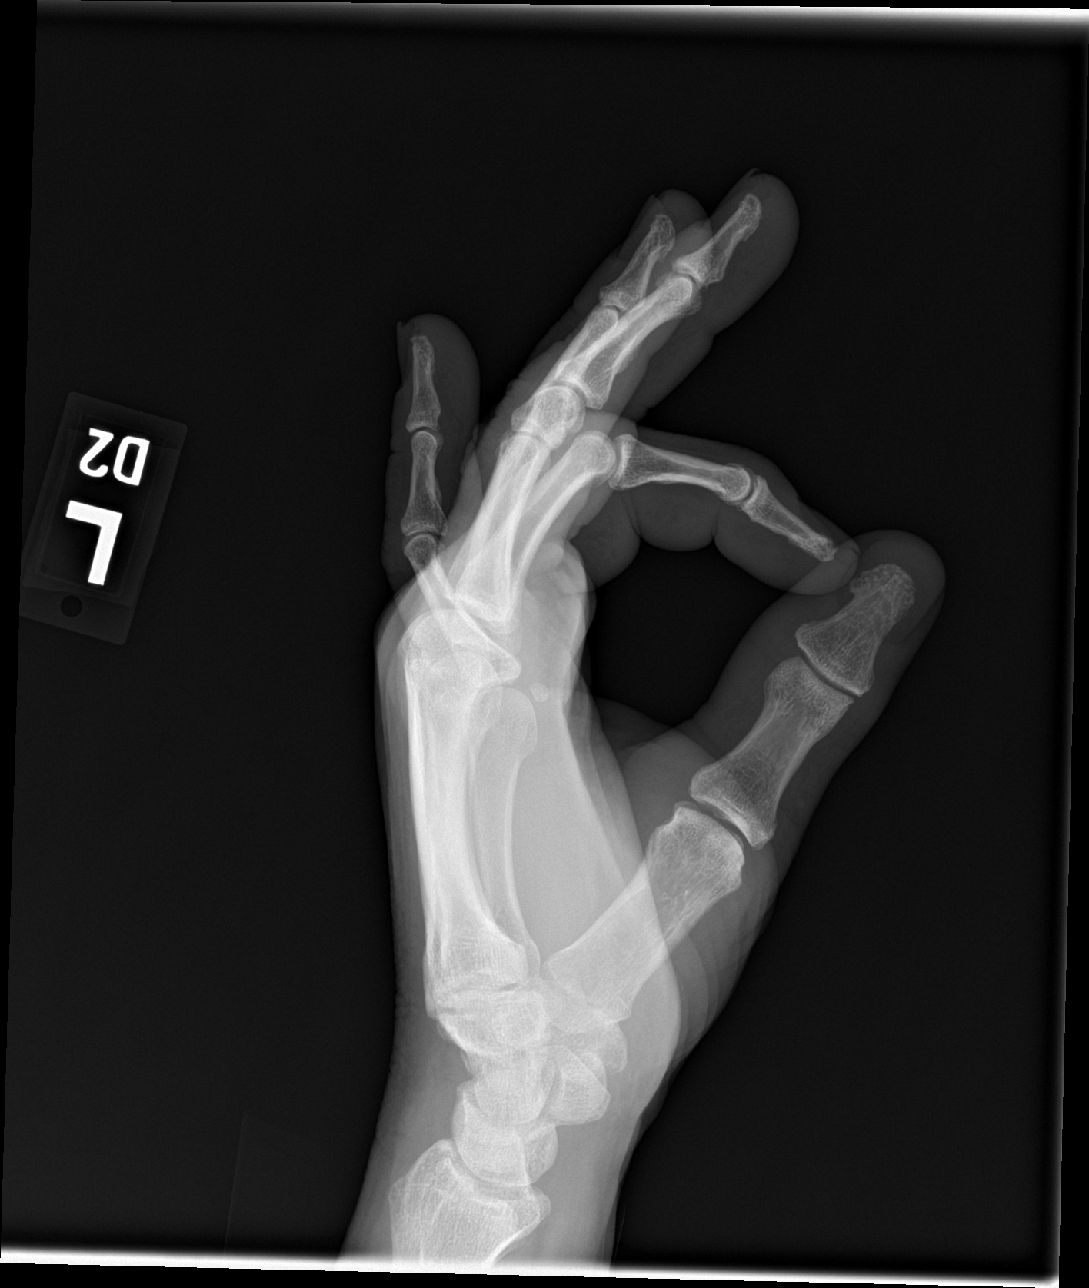

[hand pa]
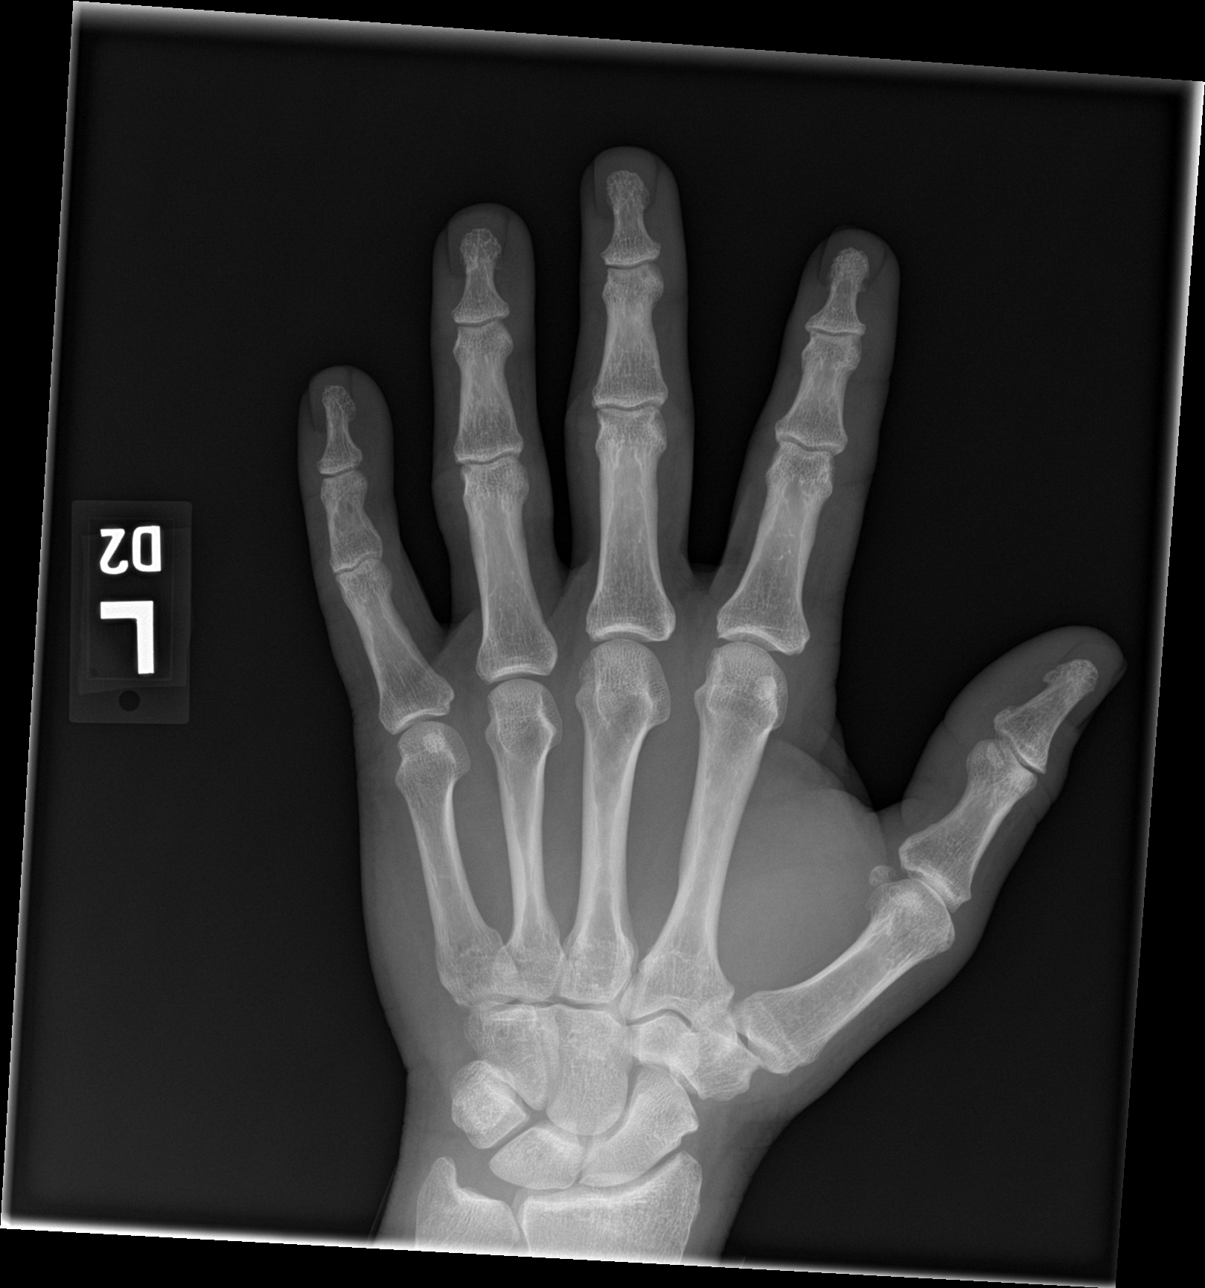

[3 of 3 positions shown; findings below may reference images not displayed]

FINDINGS: No acute fracture or dislocation is noted. No soft tissue
abnormality is noted. No radiopaque foreign body is seen.
IMPRESSION: No acute abnormality noted.

## 2019-08-10 IMAGING — CT CT HEAD W/O CM
4 series · 17 of 47 positions shown, 19 images · non-contrast
Comparison: None.

CLINICAL DATA: Status post trauma.

EXAM:
CT HEAD WITHOUT CONTRAST
TECHNIQUE: Contiguous axial images were obtained from the base of the skull
through the vertex without intravenous contrast.

[Series 3: head wo · axial · 0.41mm/px · z∈[+988,+1113]mm · 7 of 35 slices shown, 9 images]
[im 5/35  brain]
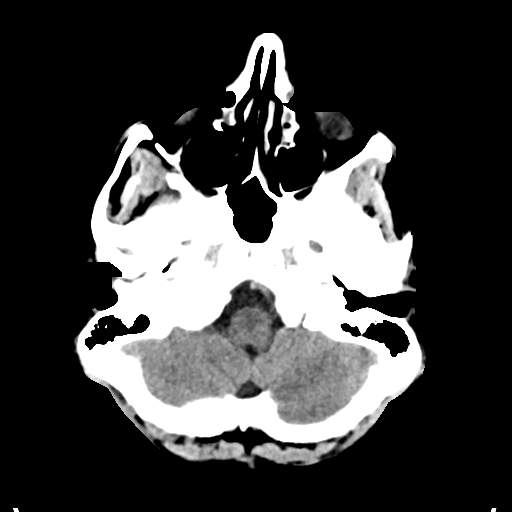
[im 5/35  bone]
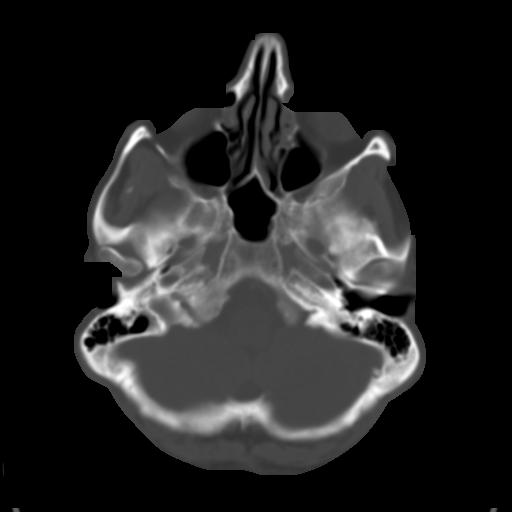
[im 9/35  brain]
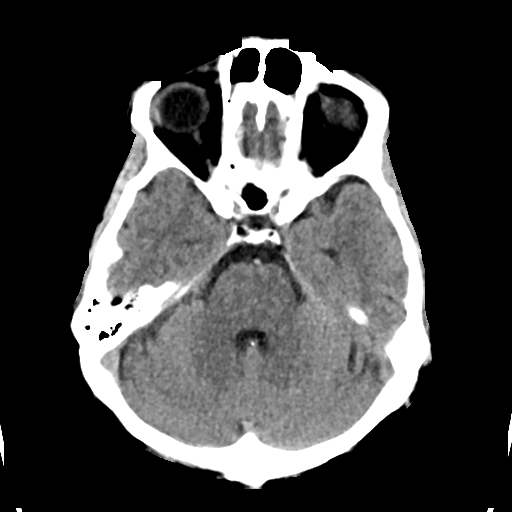
[im 13/35  brain]
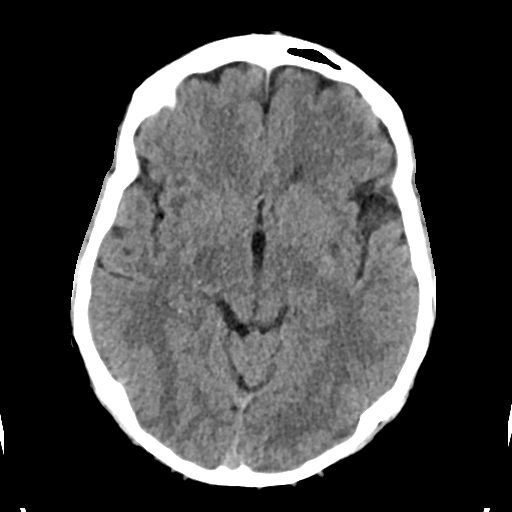
[im 18/35  brain]
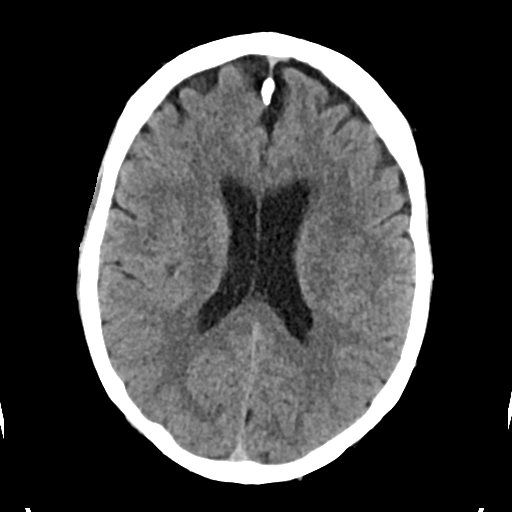
[im 22/35  brain]
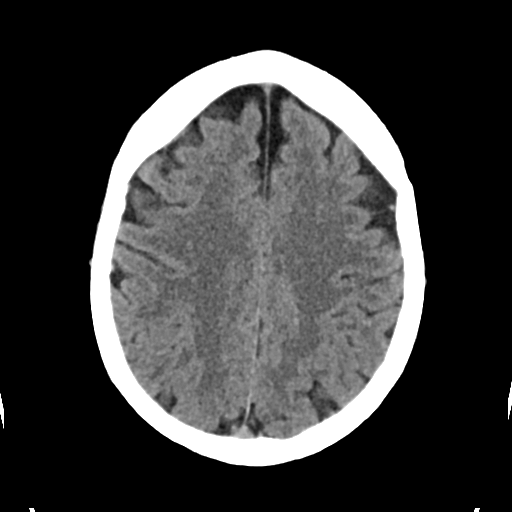
[im 22/35  bone]
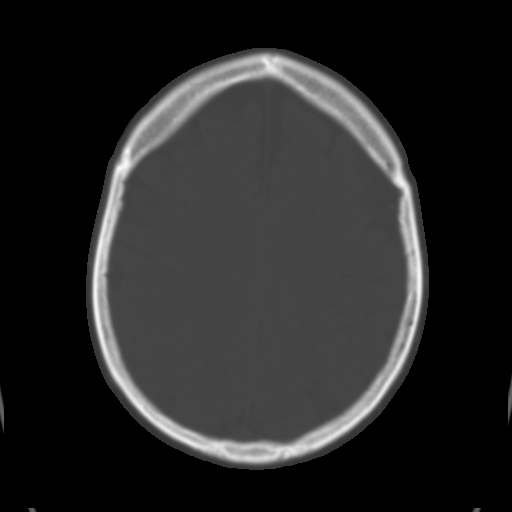
[im 26/35  brain]
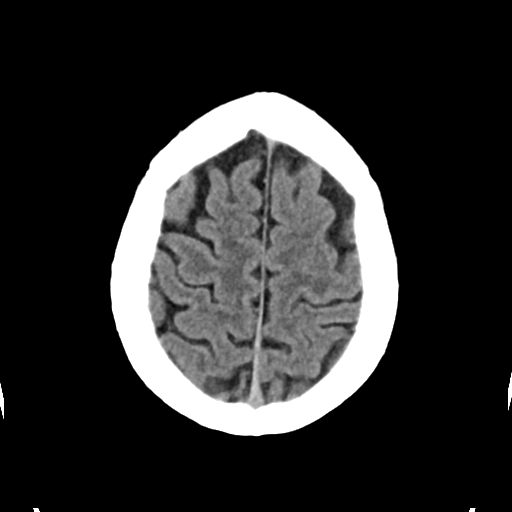
[im 30/35  brain]
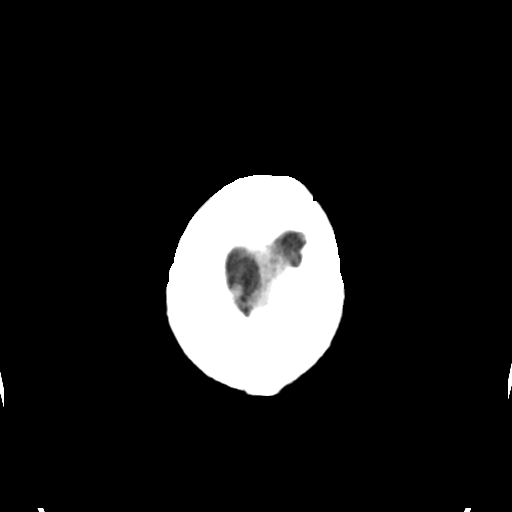

[Series 4: head bone · axial · 0.41mm/px · z∈[+984,+1044]mm · 4 of 87 slices shown]
[im 9/87  bone]
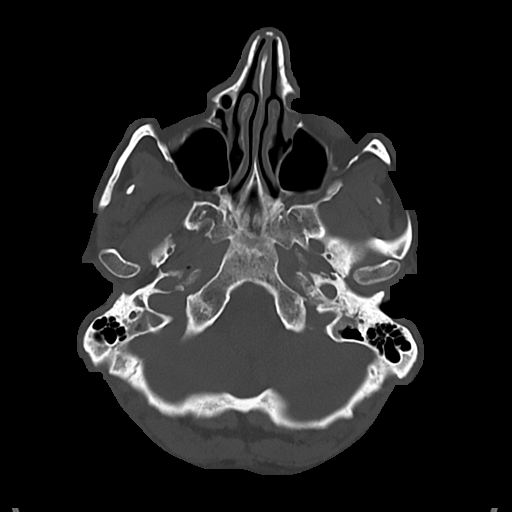
[im 18/87  bone]
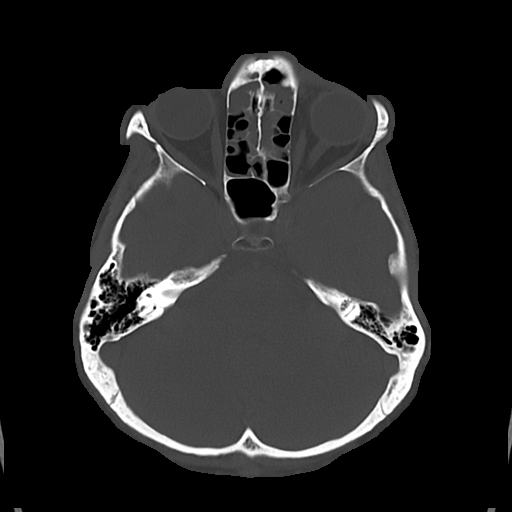
[im 26/87  bone]
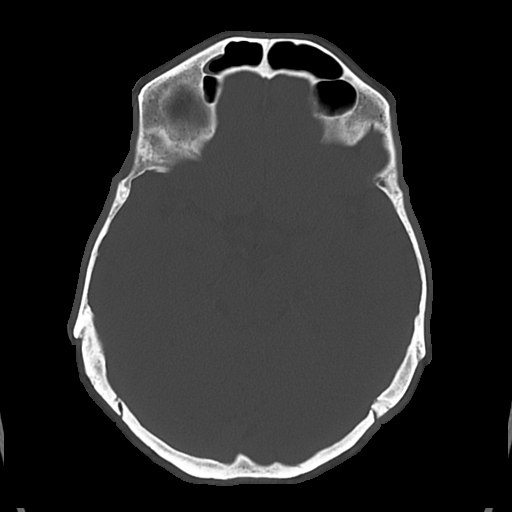
[im 39/87  bone]
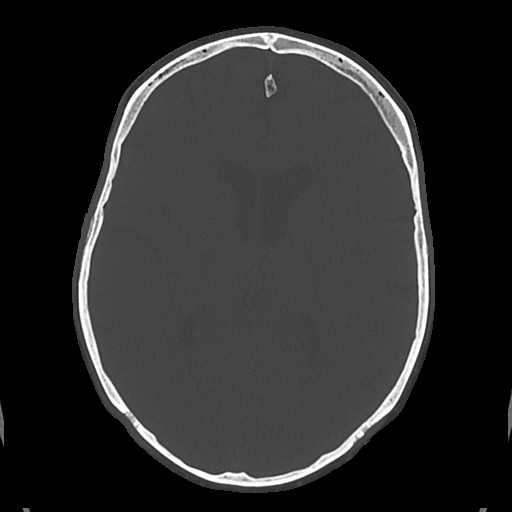

[Series 5: cor soft · coronal · 0.33mm/px · 3 of 69 slices shown]
[im 23/69  brain]
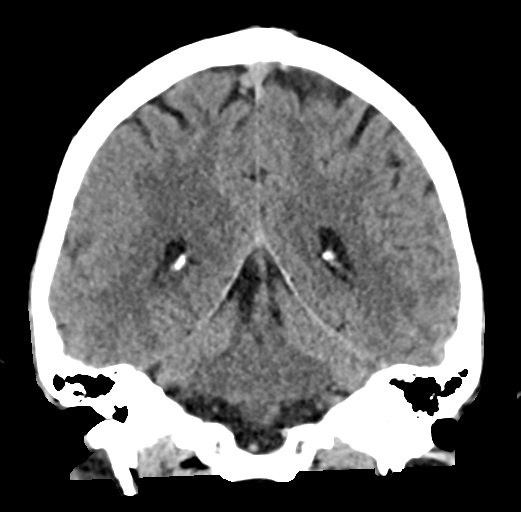
[im 31/69  brain]
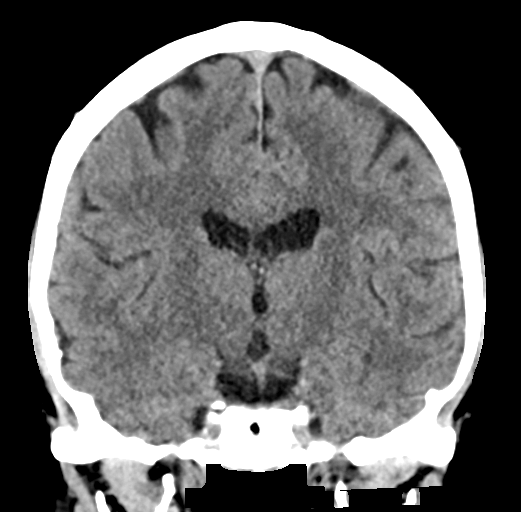
[im 38/69  brain]
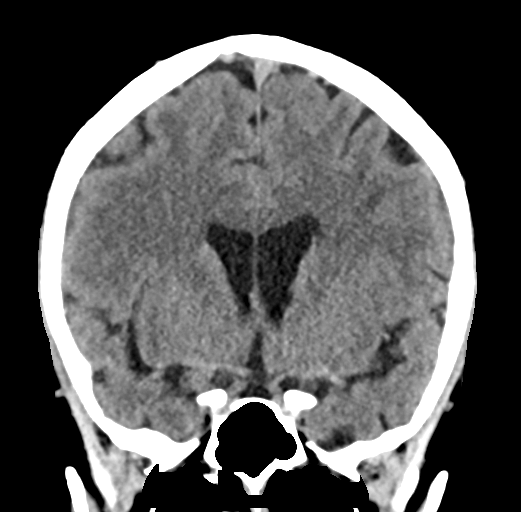

[Series 6: sag soft · sagittal · 0.33mm/px · 3 of 57 slices shown]
[im 19/57  brain]
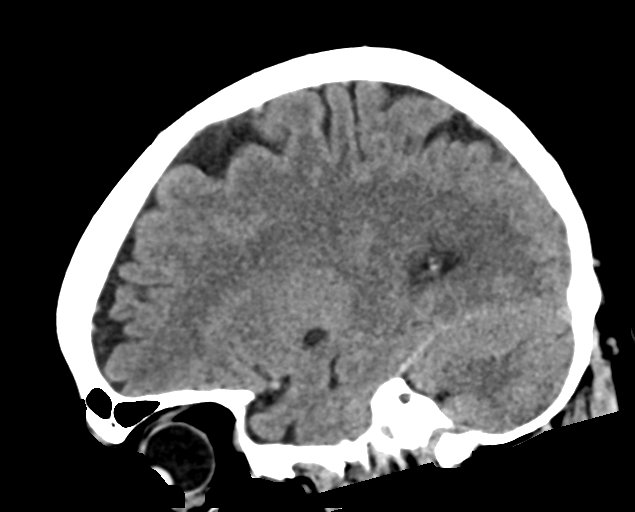
[im 29/57  brain]
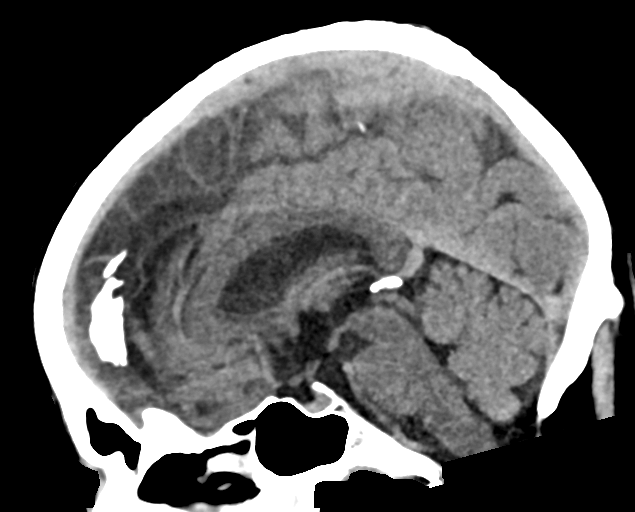
[im 38/57  brain]
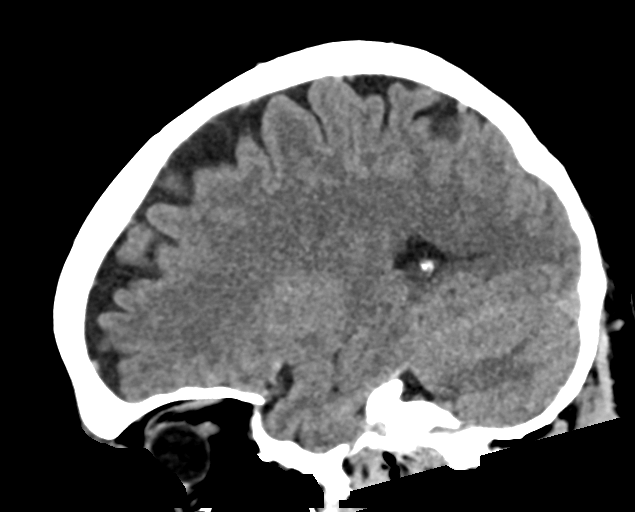

[17 of 47 positions shown; findings below may reference images not displayed]

FINDINGS: Brain: No evidence of acute infarction, hemorrhage, hydrocephalus,
extra-axial collection or mass lesion/mass effect.

Vascular: No hyperdense vessel or unexpected calcification.

Skull: Normal. Negative for fracture or focal lesion.

Sinuses/Orbits: There is marked severity bilateral ethmoid sinus
mucosal thickening.

Other: None.
IMPRESSION: 1. No acute intracranial abnormality.
2. Marked severity bilateral ethmoid sinus disease.

## 2019-08-10 IMAGING — DX DG FINGER RING 2+V*R*
3 series · 3 of 3 positions shown · non-contrast
Comparison: None.

CLINICAL DATA: Right fourth digit laceration with pain and
swelling, initial encounter

EXAM:
RIGHT RING FINGER 2+V

[finger obl]
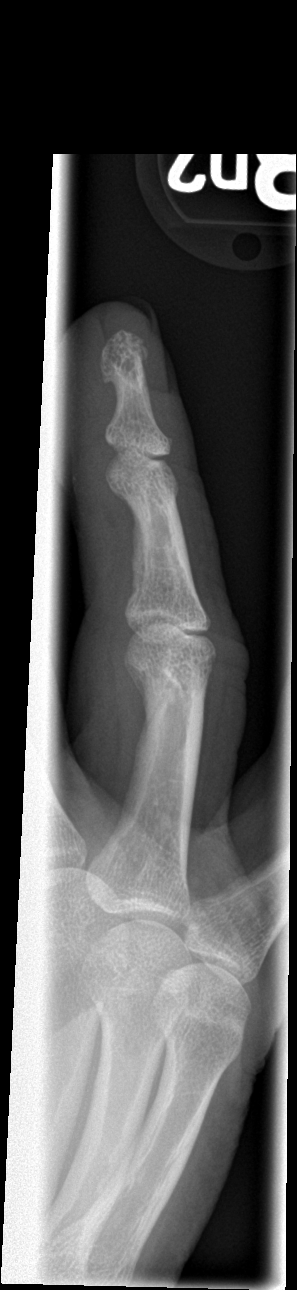

[finger lat]
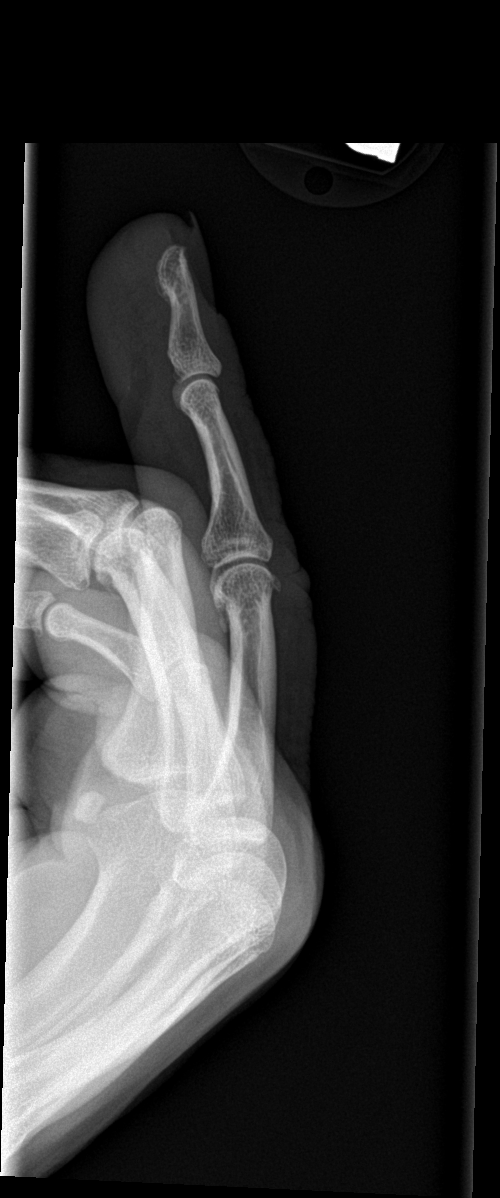

[finger ap]
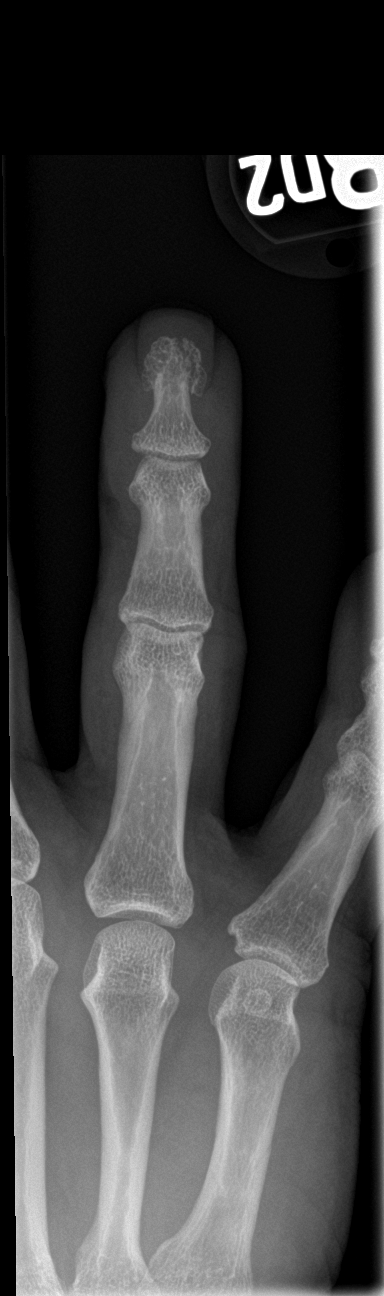

[3 of 3 positions shown; findings below may reference images not displayed]

FINDINGS: Soft tissue laceration is noted in the distal aspect of the fourth
digit. A tiny radiopacity is noted which may represent a foreign
body within the laceration. Comminuted distal phalangeal tuft
fracture is noted. No other focal abnormality is seen.
IMPRESSION: Distal phalangeal fracture with soft tissue laceration and likely
tiny foreign body.

## 2019-08-10 MED ORDER — IBUPROFEN 600 MG PO TABS: 600.0000 mg | ORAL_TABLET | Freq: Four times a day (QID) | ORAL | 0 refills | Status: DC | PRN

## 2019-08-10 MED ORDER — IBUPROFEN 400 MG PO TABS
600.0000 mg | ORAL_TABLET | Freq: Once | ORAL | Status: AC
  Administered 2019-08-10: 600 mg via ORAL
  Filled 2019-08-10: qty 1

## 2019-08-10 MED ORDER — BUPIVACAINE HCL (PF) 0.5 % IJ SOLN
10.0000 mL | Freq: Once | INTRAMUSCULAR | Status: AC
  Administered 2019-08-10: 10 mL
  Filled 2019-08-10: qty 10

## 2019-08-10 MED ORDER — CEPHALEXIN 250 MG PO CAPS
500.0000 mg | ORAL_CAPSULE | Freq: Once | ORAL | Status: AC
  Administered 2019-08-10: 500 mg via ORAL
  Filled 2019-08-10: qty 2

## 2019-08-10 MED ORDER — HYDROCODONE-ACETAMINOPHEN 5-325 MG PO TABS: 1.0000 | ORAL_TABLET | ORAL | 0 refills | Status: DC | PRN

## 2019-08-10 MED ORDER — AMOXICILLIN-POT CLAVULANATE 875-125 MG PO TABS: 1.0000 | ORAL_TABLET | Freq: Two times a day (BID) | ORAL | 0 refills | Status: DC

## 2019-08-10 MED ORDER — HYDROCODONE-ACETAMINOPHEN 5-325 MG PO TABS
1.0000 | ORAL_TABLET | Freq: Once | ORAL | Status: AC
  Administered 2019-08-10: 1 via ORAL
  Filled 2019-08-10: qty 1
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
## 2019-08-10 NOTE — Progress Notes (Signed)
Orthopedic Tech Progress Note Patient Details:  Roberto Knapp 24-Feb-1971 [id number]  Ortho Devices Type of Ortho Device: Finger splint Ortho Device/Splint Location: applied finger splint Ortho Device/Splint Interventions: Ordered, Application, Adjustment   Post Interventions Patient Tolerated: Well Instructions Provided: Care of device, Adjustment of device   Trinna Post 08/10/2019, 10:40 PM

## 2019-08-10 NOTE — ED Notes (Signed)
Discharge instructions discussed with pt. And significant other at bedside. Pt verbalized understanding with no questions at this time. Pt to follow up with Hand surgery.

## 2019-08-10 NOTE — ED Notes (Signed)
Ortho at bedside.

## 2019-08-10 NOTE — ED Notes (Signed)
VM left for ortho requesting finger splint

## 2020-03-06 DIAGNOSIS — Z23 Encounter for immunization: Secondary | ICD-10-CM | POA: Diagnosis not present

## 2020-03-18 DIAGNOSIS — R69 Illness, unspecified: Secondary | ICD-10-CM | POA: Diagnosis not present

## 2020-03-18 DIAGNOSIS — Z720 Tobacco use: Secondary | ICD-10-CM | POA: Diagnosis not present

## 2020-03-18 DIAGNOSIS — Z23 Encounter for immunization: Secondary | ICD-10-CM | POA: Diagnosis not present

## 2020-03-18 DIAGNOSIS — M25511 Pain in right shoulder: Secondary | ICD-10-CM | POA: Diagnosis not present

## 2020-03-24 DIAGNOSIS — M75111 Incomplete rotator cuff tear or rupture of right shoulder, not specified as traumatic: Secondary | ICD-10-CM | POA: Diagnosis not present

## 2020-06-01 DIAGNOSIS — M75111 Incomplete rotator cuff tear or rupture of right shoulder, not specified as traumatic: Secondary | ICD-10-CM | POA: Diagnosis not present

## 2020-07-25 ENCOUNTER — Emergency Department (HOSPITAL_COMMUNITY)
Admission: EM | Admit: 2020-07-25 | Discharge: 2020-07-25 | Disposition: A | Discharge status: Home / Self Care | Payer: 59 | Attending: Emergency Medicine | Admitting: Emergency Medicine

## 2020-07-25 ENCOUNTER — Emergency Department (HOSPITAL_COMMUNITY): Payer: 59

## 2020-07-25 ENCOUNTER — Other Ambulatory Visit: Payer: Self-pay

## 2020-07-25 ENCOUNTER — Encounter (HOSPITAL_COMMUNITY): Payer: Self-pay | Admitting: Emergency Medicine

## 2020-07-25 DIAGNOSIS — H538 Other visual disturbances: Secondary | ICD-10-CM | POA: Diagnosis not present

## 2020-07-25 DIAGNOSIS — R42 Dizziness and giddiness: Secondary | ICD-10-CM | POA: Insufficient documentation

## 2020-07-25 DIAGNOSIS — Z20822 Contact with and (suspected) exposure to covid-19: Secondary | ICD-10-CM | POA: Diagnosis not present

## 2020-07-25 DIAGNOSIS — F17210 Nicotine dependence, cigarettes, uncomplicated: Secondary | ICD-10-CM | POA: Insufficient documentation

## 2020-07-25 DIAGNOSIS — R69 Illness, unspecified: Secondary | ICD-10-CM | POA: Diagnosis not present

## 2020-07-25 DIAGNOSIS — H539 Unspecified visual disturbance: Secondary | ICD-10-CM | POA: Diagnosis not present

## 2020-07-25 LAB — URINALYSIS, ROUTINE W REFLEX MICROSCOPIC
Bacteria, UA: NONE SEEN
Bilirubin Urine: NEGATIVE
Glucose, UA: NEGATIVE mg/dL
Hgb urine dipstick: NEGATIVE
Ketones, ur: NEGATIVE mg/dL
Leukocytes,Ua: NEGATIVE
Nitrite: NEGATIVE
Protein, ur: NEGATIVE mg/dL
Specific Gravity, Urine: 1.019 (ref 1.005–1.030)
pH: 7 (ref 5.0–8.0)

## 2020-07-25 LAB — DIFFERENTIAL
Abs Immature Granulocytes: 0.04 10*3/uL (ref 0.00–0.07)
Basophils Absolute: 0.1 10*3/uL (ref 0.0–0.1)
Basophils Relative: 0 %
Eosinophils Absolute: 0.2 10*3/uL (ref 0.0–0.5)
Eosinophils Relative: 1 %
Immature Granulocytes: 0 %
Lymphocytes Relative: 13 %
Lymphs Abs: 1.5 10*3/uL (ref 0.7–4.0)
Monocytes Absolute: 0.6 10*3/uL (ref 0.1–1.0)
Monocytes Relative: 5 %
Neutro Abs: 9.4 10*3/uL — ABNORMAL HIGH (ref 1.7–7.7)
Neutrophils Relative %: 81 %

## 2020-07-25 LAB — COMPREHENSIVE METABOLIC PANEL
ALT: 26 U/L (ref 0–44)
AST: 25 U/L (ref 15–41)
Albumin: 4.2 g/dL (ref 3.5–5.0)
Alkaline Phosphatase: 73 U/L (ref 38–126)
Anion gap: 6 (ref 5–15)
BUN: 20 mg/dL (ref 6–20)
CO2: 24 mmol/L (ref 22–32)
Calcium: 9 mg/dL (ref 8.9–10.3)
Chloride: 104 mmol/L (ref 98–111)
Creatinine, Ser: 0.98 mg/dL (ref 0.61–1.24)
GFR, Estimated: >60 mL/min (ref >60)
Glucose, Bld: 101 mg/dL — ABNORMAL HIGH (ref 70–99)
Potassium: 4 mmol/L (ref 3.5–5.1)
Sodium: 134 mmol/L — ABNORMAL LOW (ref 135–145)
Total Bilirubin: 0.7 mg/dL (ref 0.3–1.2)
Total Protein: 7.7 g/dL (ref 6.5–8.1)

## 2020-07-25 LAB — CBC
HCT: 47.7 % (ref 39.0–52.0)
Hemoglobin: 15.9 g/dL (ref 13.0–17.0)
MCH: 31.6 pg (ref 26.0–34.0)
MCHC: 33.3 g/dL (ref 30.0–36.0)
MCV: 94.8 fL (ref 80.0–100.0)
Platelets: 297 10*3/uL (ref 150–400)
RBC: 5.03 MIL/uL (ref 4.22–5.81)
RDW: 12.6 % (ref 11.5–15.5)
WBC: 11.8 10*3/uL — ABNORMAL HIGH (ref 4.0–10.5)
nRBC: 0 % (ref 0.0–0.2)

## 2020-07-25 LAB — RAPID URINE DRUG SCREEN, HOSP PERFORMED
Amphetamines: POSITIVE — AB
Barbiturates: NOT DETECTED
Benzodiazepines: NOT DETECTED
Cocaine: NOT DETECTED
Opiates: NOT DETECTED
Tetrahydrocannabinol: POSITIVE — AB

## 2020-07-25 LAB — APTT: aPTT: 30 seconds (ref 24–36)

## 2020-07-25 LAB — PROTIME-INR
INR: 0.9 (ref 0.8–1.2)
Prothrombin Time: 12.3 seconds (ref 11.4–15.2)

## 2020-07-25 LAB — RESP PANEL BY RT-PCR (FLU A&B, COVID) ARPGX2
Influenza A by PCR: NEGATIVE
Influenza B by PCR: NEGATIVE
SARS Coronavirus 2 by RT PCR: NEGATIVE

## 2020-07-25 IMAGING — MR MR MRA NECK W/O CM
15 of 23 series · 29 of 48 positions shown · non-contrast
Comparison: None.

CLINICAL DATA: Dizziness, vision changes



[Series 13: DWI · axial · 3.0mm · 1.36mm/px · z∈[-6,+143]mm · 3 of 104 slices shown (1 of 4)]
[im 1/104]
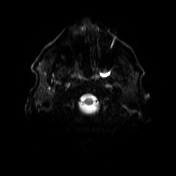
[im 52/104]
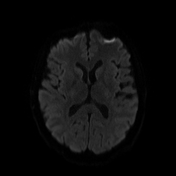
[im 104/104]
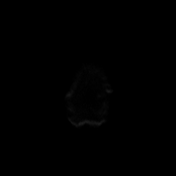

[Series 14: DWI · axial · 3.0mm · 1.36mm/px · 1 of 52 slices shown (2 of 4)]
[im 1/52]
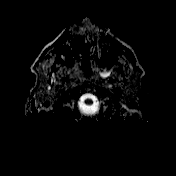

[Series 15: T1 · sagittal · 5.0mm · 0.75mm/px · 1 of 24 slices shown (1 of 2)]
[im 1/24]
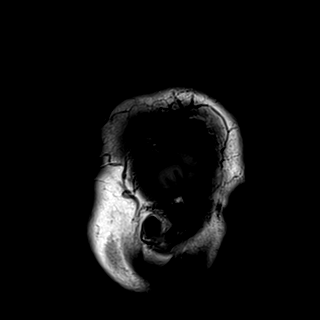

[Series 16: T2 · axial · 5.0mm · 0.62mm/px · 1 of 26 slices shown (1 of 2)]
[im 1/26]
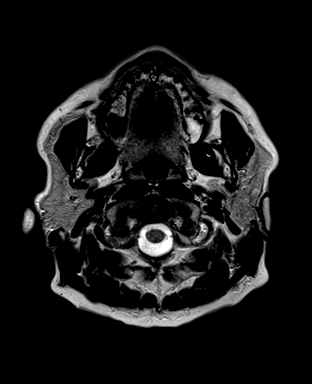

[Series 18: swi_images · axial · 3.0mm · 0.75mm/px · 1 of 56 slices shown]
[im 1/56]
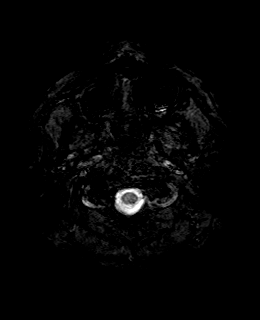

[Series 19: T1 · axial · 1.0mm · 0.94mm/px · z∈[-15,+140]mm · 6 of 160 slices shown (2 of 2)]
[im 1/160]
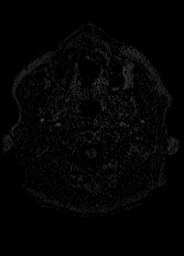
[im 32/160]
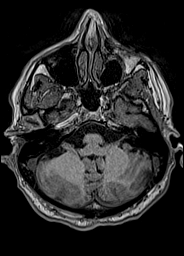
[im 64/160]
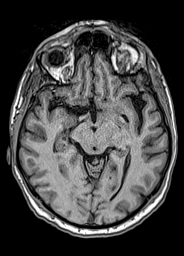
[im 96/160]
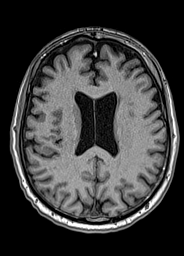
[im 128/160]
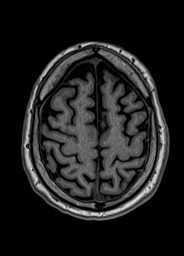
[im 160/160]
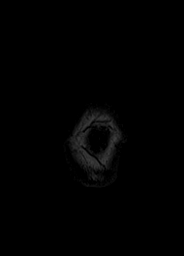

[Series 20: FLAIR · axial · 3.0mm · 0.75mm/px · z∈[-10,+139]mm · 2 of 52 slices shown]
[im 1/52]
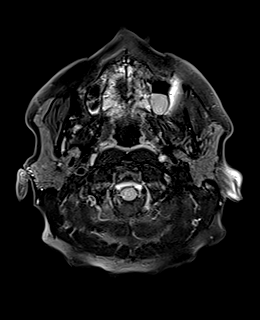
[im 52/52]
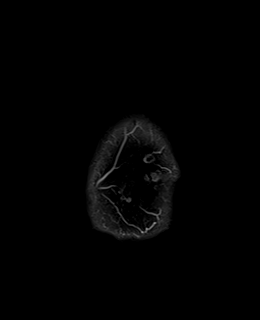

[Series 21: DWI · coronal · 5.0mm · 1.31mm/px · 2 of 68 slices shown (3 of 4)]
[im 1/68]
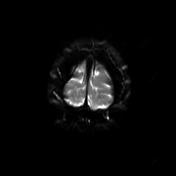
[im 68/68]
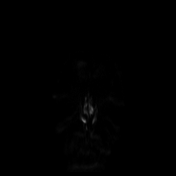

[Series 22: DWI · coronal · 5.0mm · 1.31mm/px · 1 of 34 slices shown (4 of 4)]
[im 1/34]
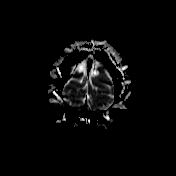

[Series 23: T2 · coronal · 5.0mm · 0.57mm/px · 1 of 24 slices shown (2 of 2)]
[im 1/24]
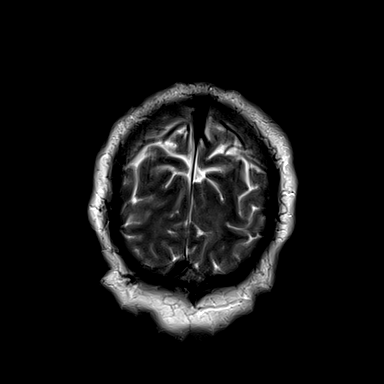

[Series 26: tof_2d_tra · axial · 3.5mm · 0.43mm/px · z∈[-194,-50]mm · 2 of 60 slices shown (1 of 2)]
[im 1/60]
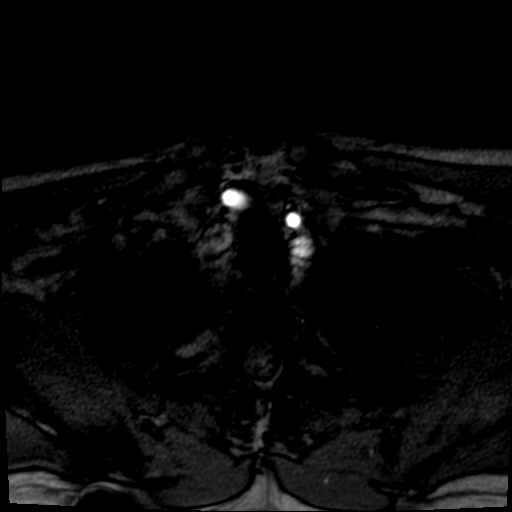
[im 60/60]
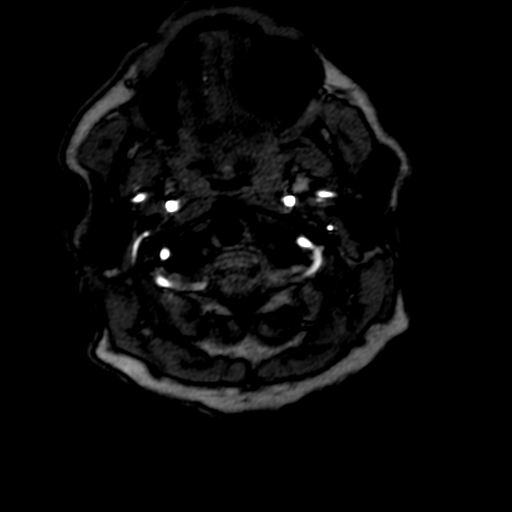

[Series 26: tof_2d_tra · axial · 3.5mm · 0.43mm/px · z∈[-194,-50]mm · 2 of 60 slices shown (2 of 2)]
[im 1/60]
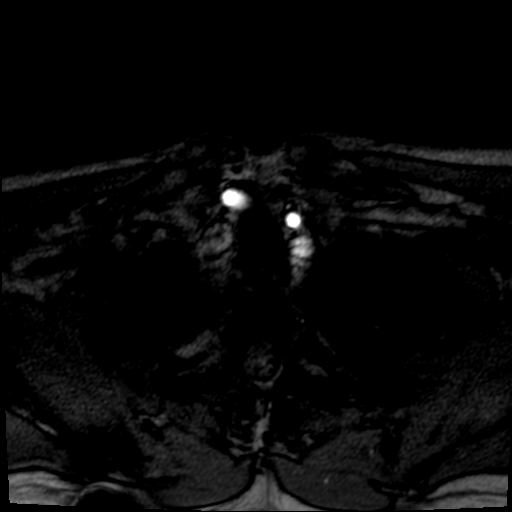
[im 60/60]
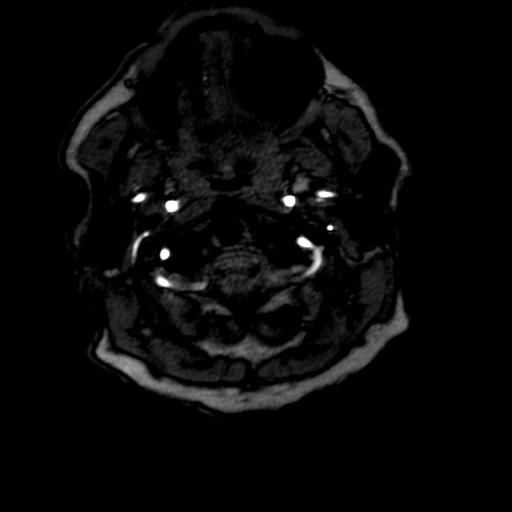

[Series 29: tof_2d_tra_mip_tra · axial · 148.1mm · 0.43mm/px · 1 of 1 slices shown (1 of 2)]
[im 1/1]
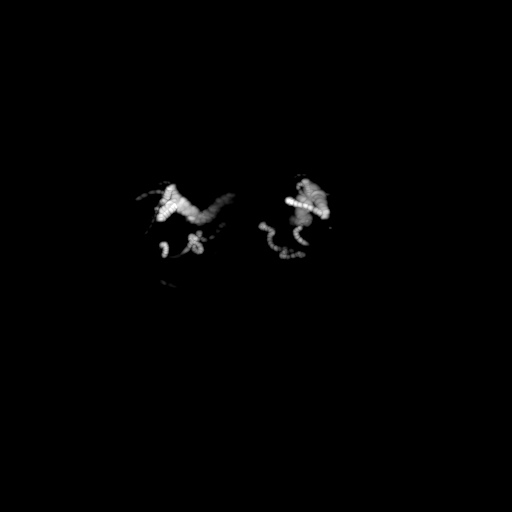

[Series 29: tof_2d_tra_mip_tra · axial · 148.1mm · 0.43mm/px · 1 of 1 slices shown (2 of 2)]
[im 1/1]
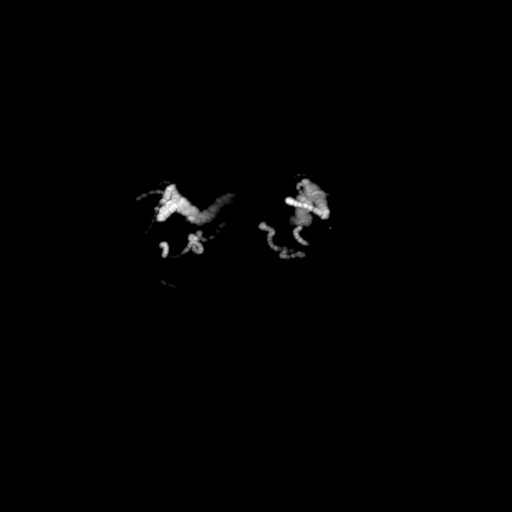

[Series 30: tof_3d_multi-slab-bifurcation · axial · 1.0mm · 0.26mm/px · z∈[-168,-66]mm · 4 of 104 slices shown]
[im 1/104]
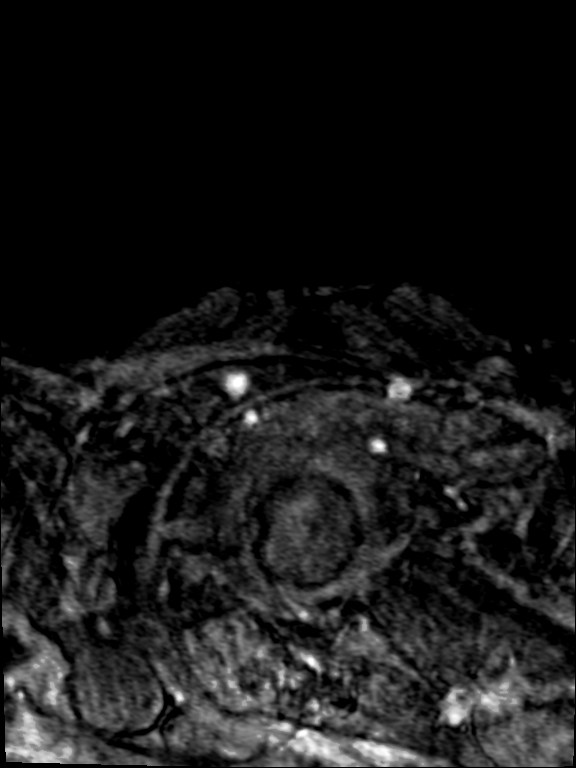
[im 35/104]
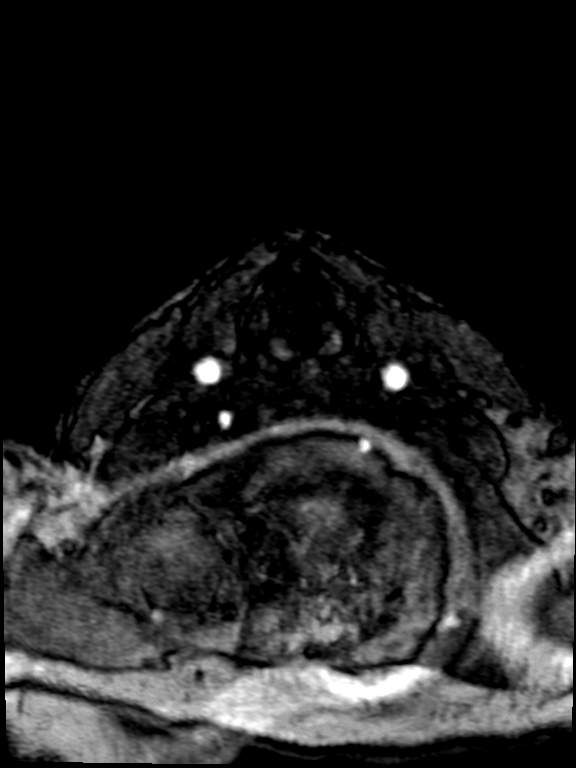
[im 69/104]
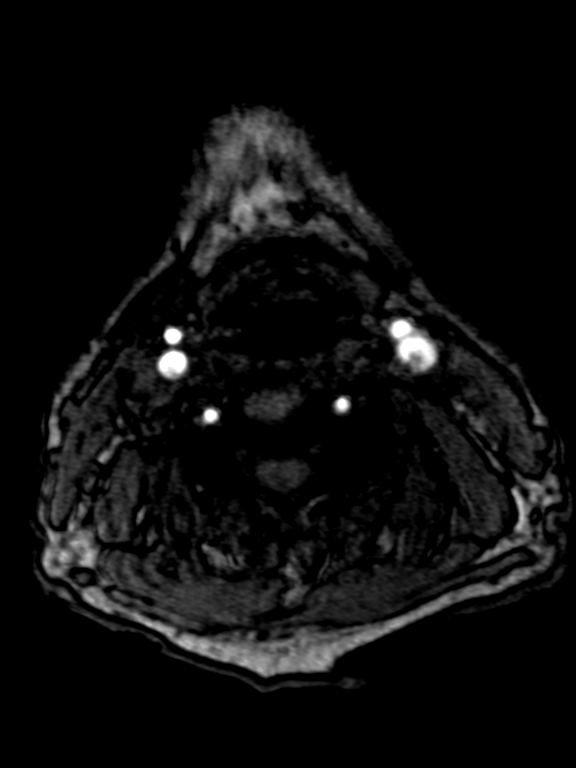
[im 104/104]
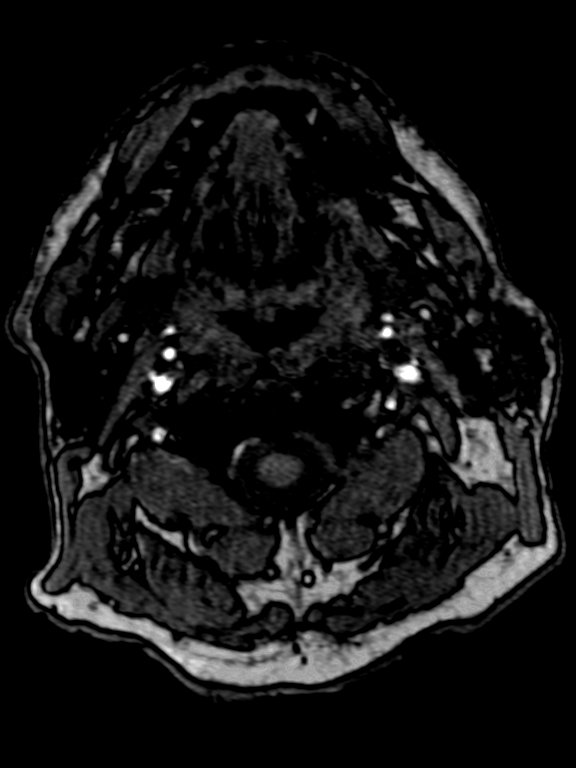

[29 of 48 positions shown; findings below may reference images not displayed]

FINDINGS: MRI HEAD

Brain: There is no acute infarction or intracranial hemorrhage.
There is no intracranial mass, mass effect, or edema. There is no
hydrocephalus or extra-axial fluid collection. Ventricles and sulci
are within normal limits in size and configuration. Patchy foci of
T2 hyperintensity in the supratentorial white matter are nonspecific
but may reflect mild chronic microvascular ischemic changes.

Vascular: Major vessel flow voids at the skull base are preserved.

Skull and upper cervical spine: Normal marrow signal is preserved.

Sinuses/Orbits: Patchy paranasal sinus mucosal thickening. Orbits
are unremarkable.

Other: Sella is unremarkable.  Mastoid air cells are clear.

MRA HEAD

Intracranial internal carotid arteries are patent. Middle and
anterior cerebral arteries are patent. Intracranial vertebral
arteries, basilar artery, posterior cerebral arteries are patent.
Bilateral posterior communicating arteries are present. There is no
significant stenosis or aneurysm.

MRA NECK

Common, internal, and external carotid arteries are patent.
Codominant vertebral arteries are patent. No hemodynamically
significant stenosis.
IMPRESSION: No acute infarction, hemorrhage, or mass. Probable mild chronic
microvascular ischemic changes.

No large vessel occlusion, hemodynamically significant stenosis, or
evidence of dissection.

## 2020-07-25 IMAGING — MR MR HEAD W/O CM
13 of 18 series · 35 of 48 positions shown · non-contrast
Comparison: None.

CLINICAL DATA: Dizziness, vision changes



[Series 12: DWI · axial · 3.0mm · 1.36mm/px · z∈[-6,+143]mm · 4 of 104 slices shown (1 of 4)]
[im 1/104]
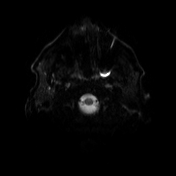
[im 35/104]
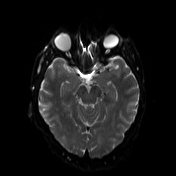
[im 69/104]
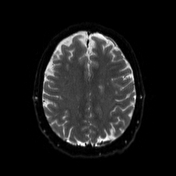
[im 104/104]
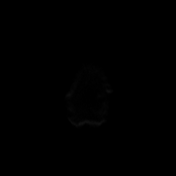

[Series 13: DWI · axial · 3.0mm · 1.36mm/px · z∈[-6,+143]mm · 2 of 52 slices shown (2 of 4)]
[im 1/52]
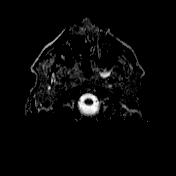
[im 52/52]
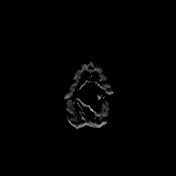

[Series 15: T1 · sagittal · 5.0mm · 0.75mm/px · 1 of 24 slices shown (1 of 2)]
[im 1/24]
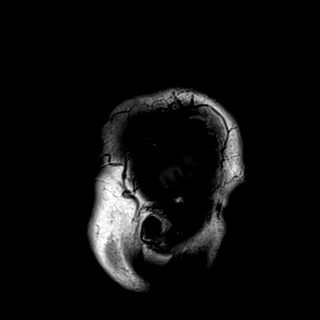

[Series 16: T2 · axial · 5.0mm · 0.62mm/px · 1 of 26 slices shown (1 of 2)]
[im 1/26]
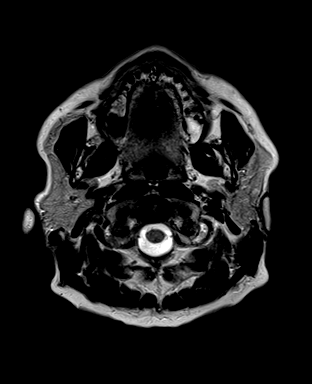

[Series 18: swi_images · axial · 3.0mm · 0.75mm/px · z∈[-17,+144]mm · 2 of 56 slices shown]
[im 1/56]
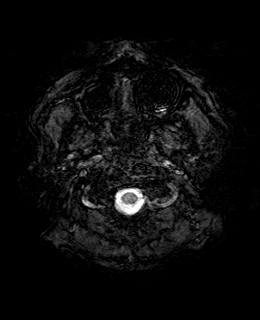
[im 56/56]
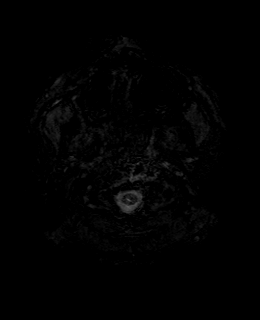

[Series 19: T1 · axial · 1.0mm · 0.94mm/px · z∈[-15,+140]mm · 8 of 160 slices shown (2 of 2)]
[im 1/160]
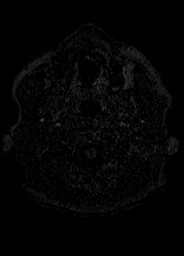
[im 23/160]
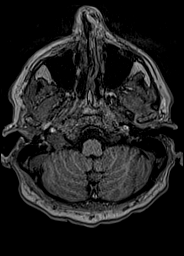
[im 46/160]
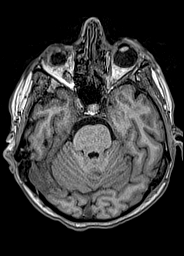
[im 69/160]
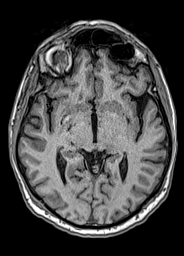
[im 91/160]
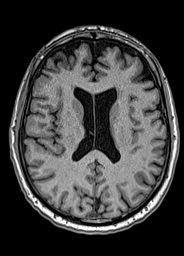
[im 114/160]
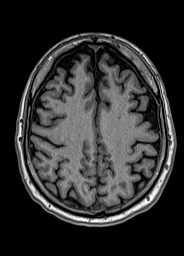
[im 137/160]
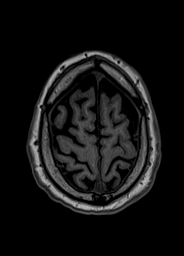
[im 160/160]
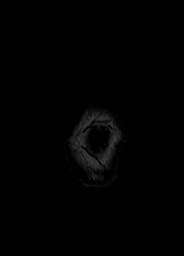

[Series 20: FLAIR · axial · 3.0mm · 0.75mm/px · z∈[-10,+139]mm · 3 of 52 slices shown]
[im 1/52]
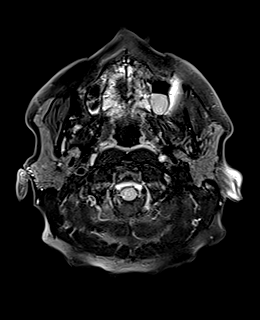
[im 26/52]
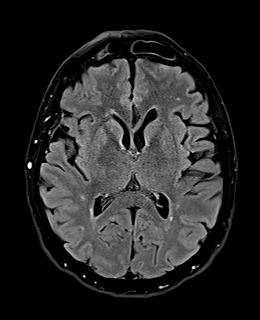
[im 52/52]
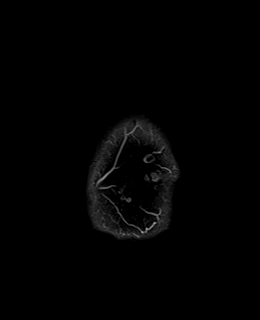

[Series 21: DWI · coronal · 5.0mm · 1.31mm/px · 3 of 68 slices shown (3 of 4)]
[im 1/68]
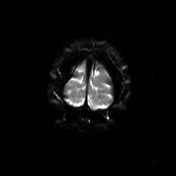
[im 34/68]
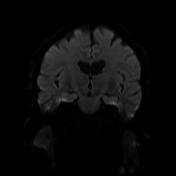
[im 68/68]
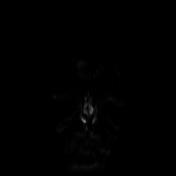

[Series 22: DWI · coronal · 5.0mm · 1.31mm/px · 2 of 34 slices shown (4 of 4)]
[im 1/34]
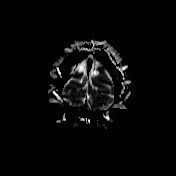
[im 34/34]
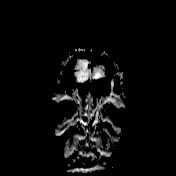

[Series 23: T2 · coronal · 5.0mm · 0.57mm/px · 1 of 24 slices shown (2 of 2)]
[im 1/24]
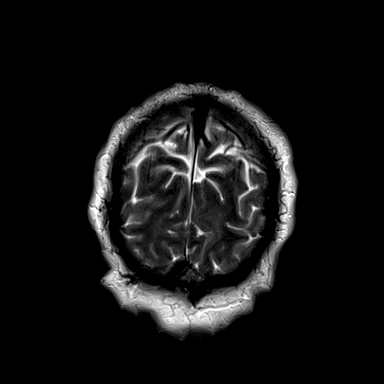

[Series 26: tof_2d_tra · axial · 3.5mm · 0.43mm/px · z∈[-194,-50]mm · 3 of 60 slices shown]
[im 1/60]
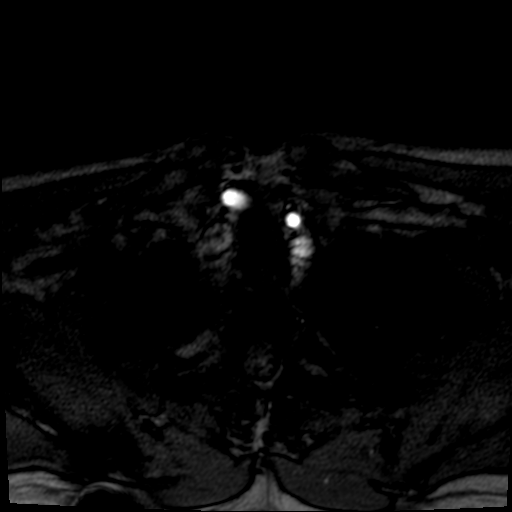
[im 30/60]
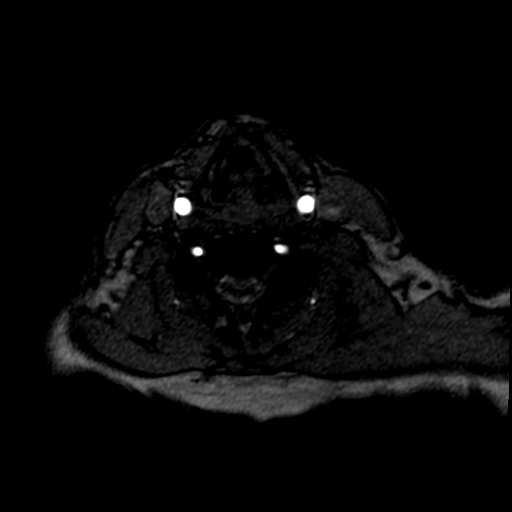
[im 60/60]
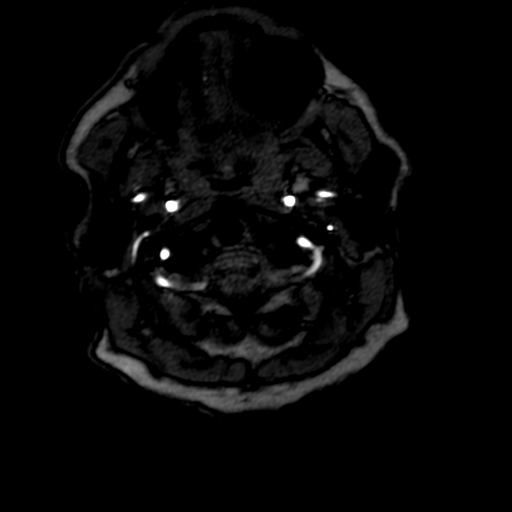

[Series 29: tof_2d_tra_mip_tra · axial · 148.1mm · 0.43mm/px · 1 of 1 slices shown]
[im 1/1]
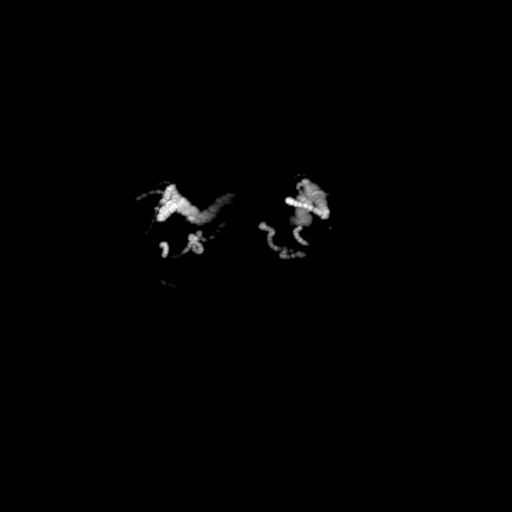

[Series 30: tof_3d_multi-slab-bifurcation · axial · 1.0mm · 0.26mm/px · z∈[-168,-92]mm · 4 of 104 slices shown]
[im 1/104]
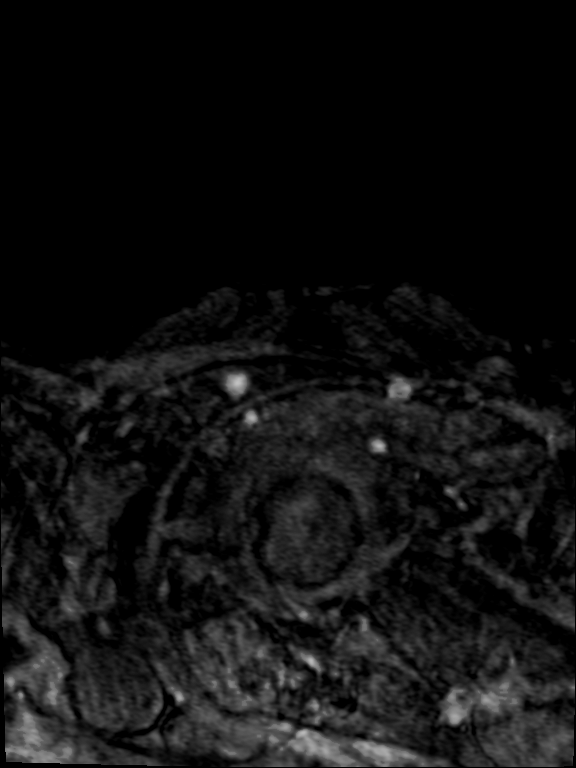
[im 26/104]
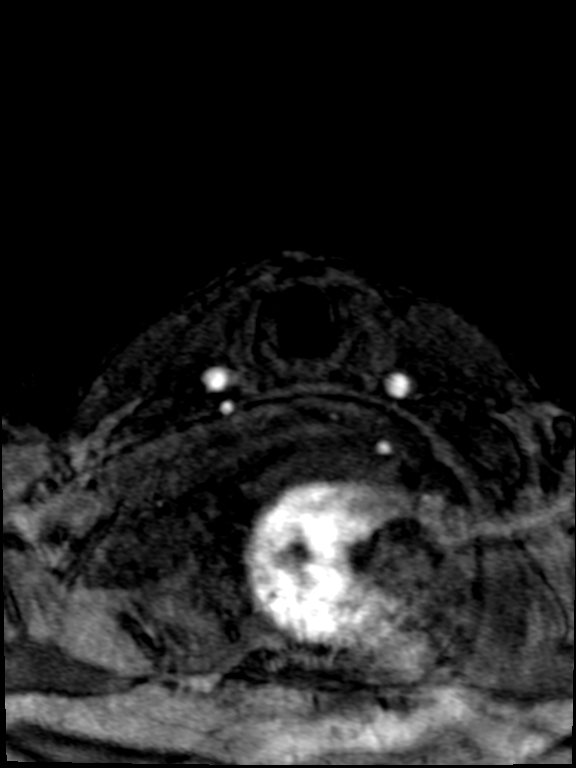
[im 52/104]
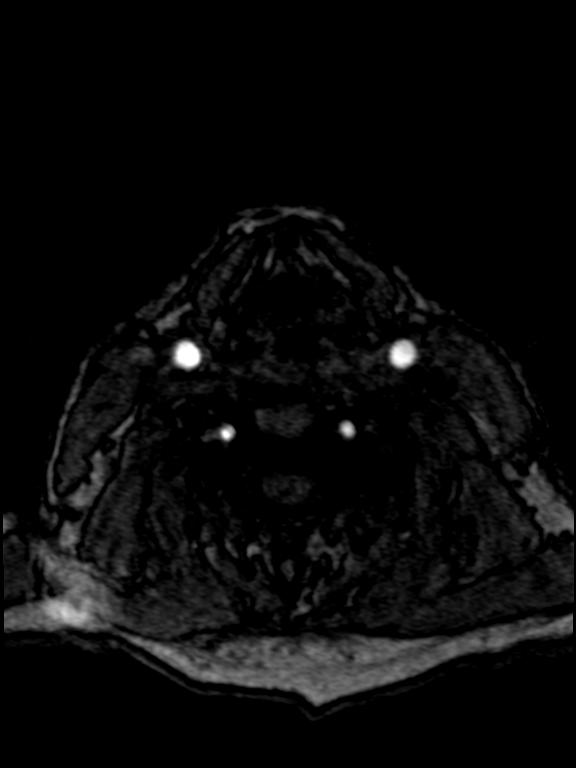
[im 78/104]
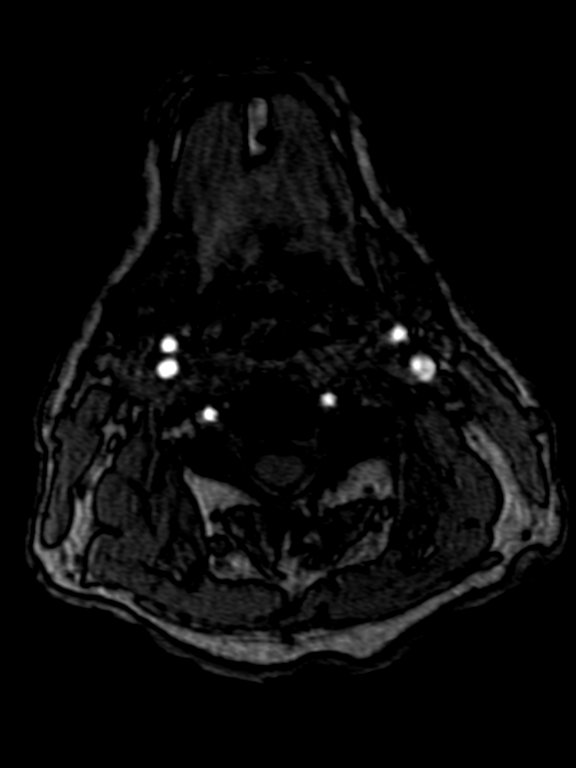

[35 of 48 positions shown; findings below may reference images not displayed]

FINDINGS: MRI HEAD

Brain: There is no acute infarction or intracranial hemorrhage.
There is no intracranial mass, mass effect, or edema. There is no
hydrocephalus or extra-axial fluid collection. Ventricles and sulci
are within normal limits in size and configuration. Patchy foci of
T2 hyperintensity in the supratentorial white matter are nonspecific
but may reflect mild chronic microvascular ischemic changes.

Vascular: Major vessel flow voids at the skull base are preserved.

Skull and upper cervical spine: Normal marrow signal is preserved.

Sinuses/Orbits: Patchy paranasal sinus mucosal thickening. Orbits
are unremarkable.

Other: Sella is unremarkable.  Mastoid air cells are clear.

MRA HEAD

Intracranial internal carotid arteries are patent. Middle and
anterior cerebral arteries are patent. Intracranial vertebral
arteries, basilar artery, posterior cerebral arteries are patent.
Bilateral posterior communicating arteries are present. There is no
significant stenosis or aneurysm.

MRA NECK

Common, internal, and external carotid arteries are patent.
Codominant vertebral arteries are patent. No hemodynamically
significant stenosis.
IMPRESSION: No acute infarction, hemorrhage, or mass. Probable mild chronic
microvascular ischemic changes.

No large vessel occlusion, hemodynamically significant stenosis, or
evidence of dissection.

## 2020-07-25 IMAGING — CT CT HEAD W/O CM
3 series · 14 of 47 positions shown, 16 images · non-contrast
Comparison: Head CT [DATE].

CLINICAL DATA: 49-year-old male with dizziness since [OI] hours,
vision changes.

EXAM:
CT HEAD WITHOUT CONTRAST
TECHNIQUE: Contiguous axial images were obtained from the base of the skull
through the vertex without intravenous contrast.

[Series 2: head wo · axial · 0.47mm/px · z∈[+1722,+1857]mm · 8 of 33 slices shown, 10 images]
[im 3/33  brain]
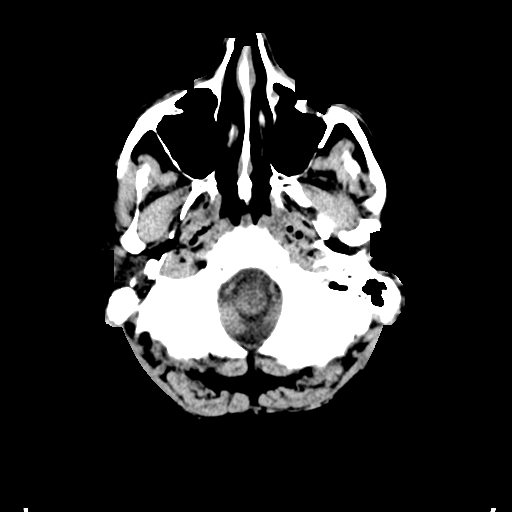
[im 3/33  bone]
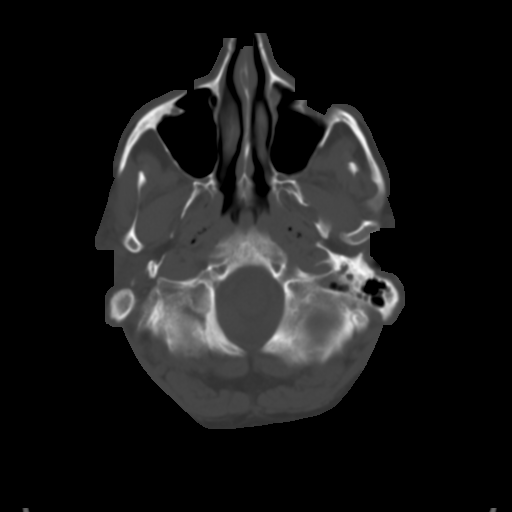
[im 7/33  brain]
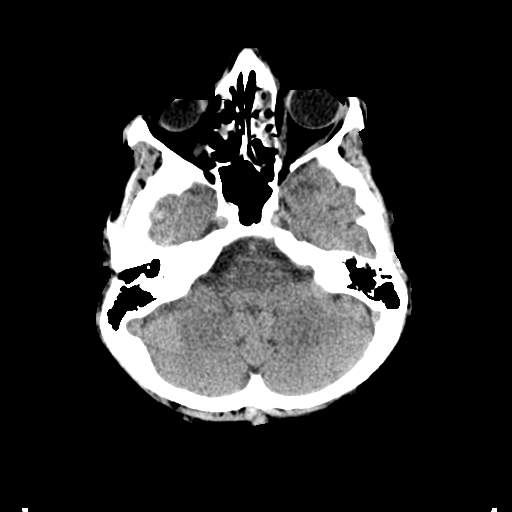
[im 10/33  brain]
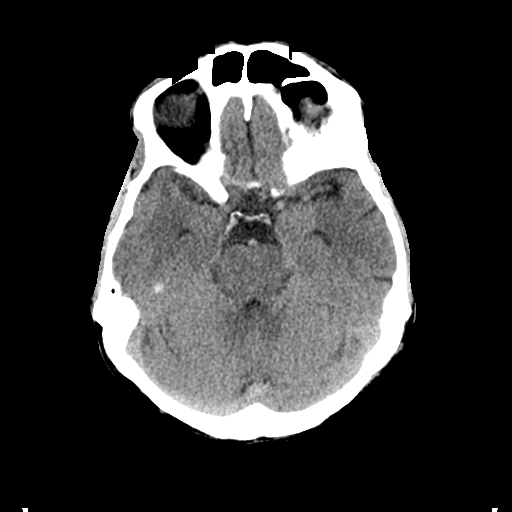
[im 15/33  brain]
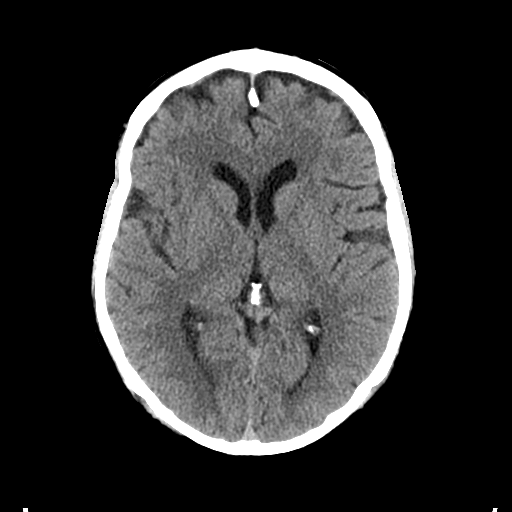
[im 18/33  brain]
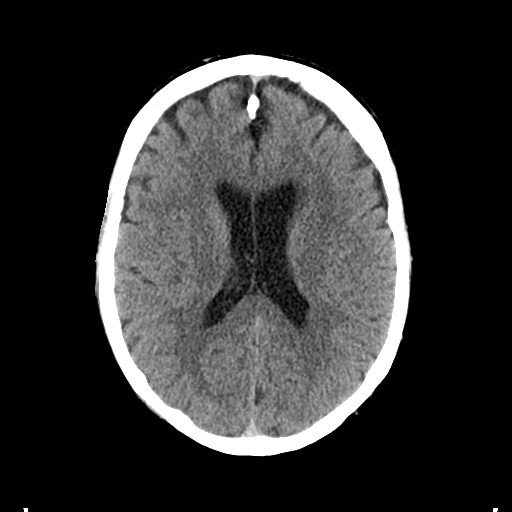
[im 18/33  bone]
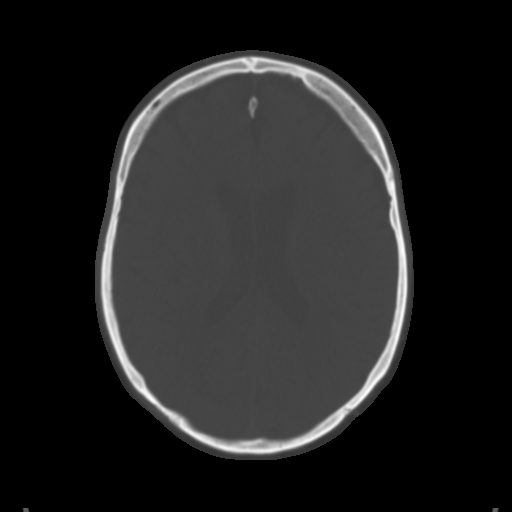
[im 23/33  brain]
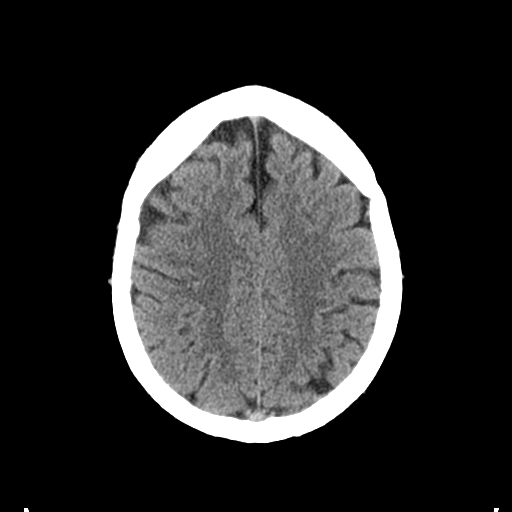
[im 26/33  brain]
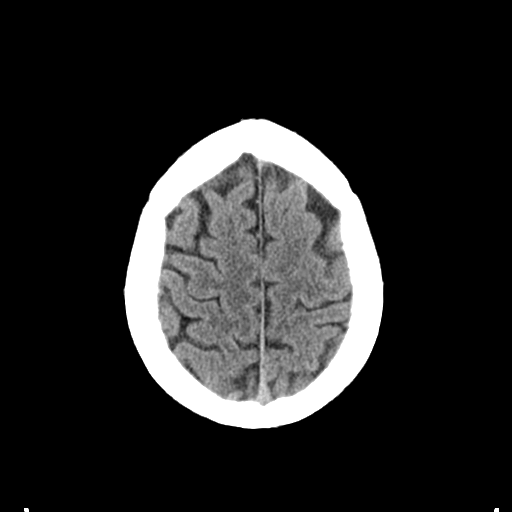
[im 30/33  brain]
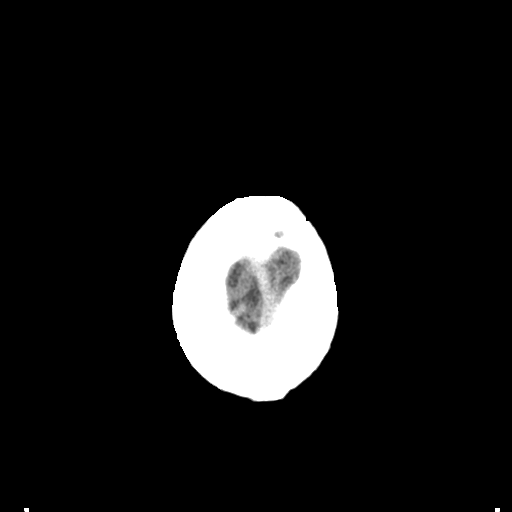

[Series 4: coronal soft tissue · coronal · 0.35mm/px · 3 of 67 slices shown]
[im 23/67  brain]
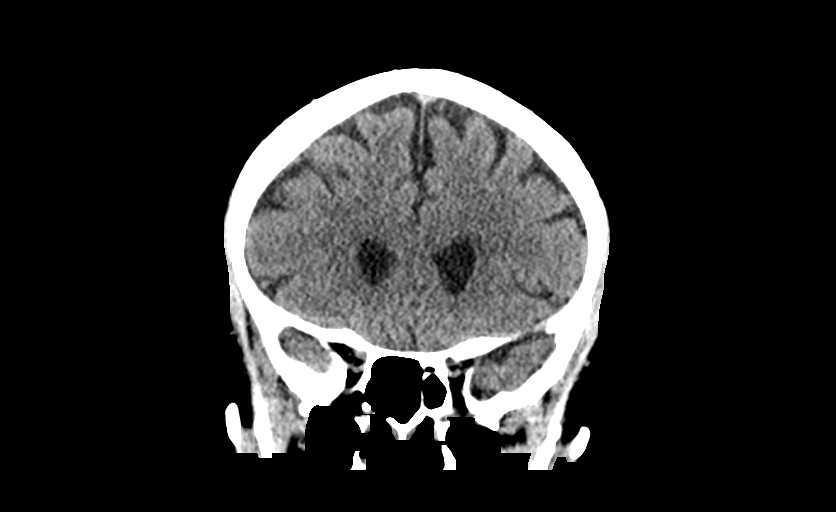
[im 30/67  brain]
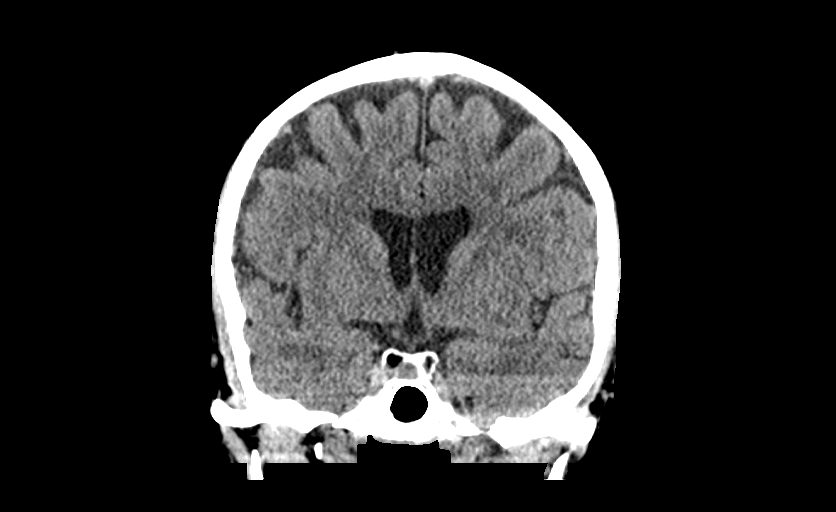
[im 37/67  brain]
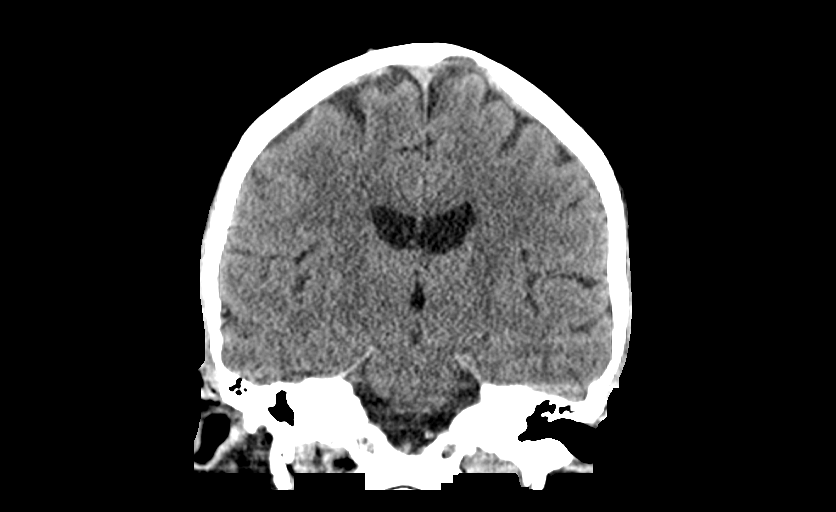

[Series 5: sagittal soft tissue · sagittal · 0.38mm/px · 3 of 53 slices shown]
[im 18/53  brain]
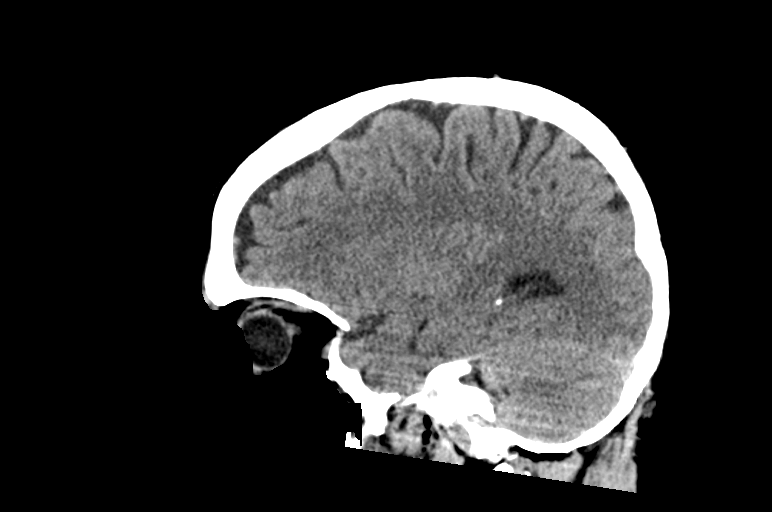
[im 27/53  brain]
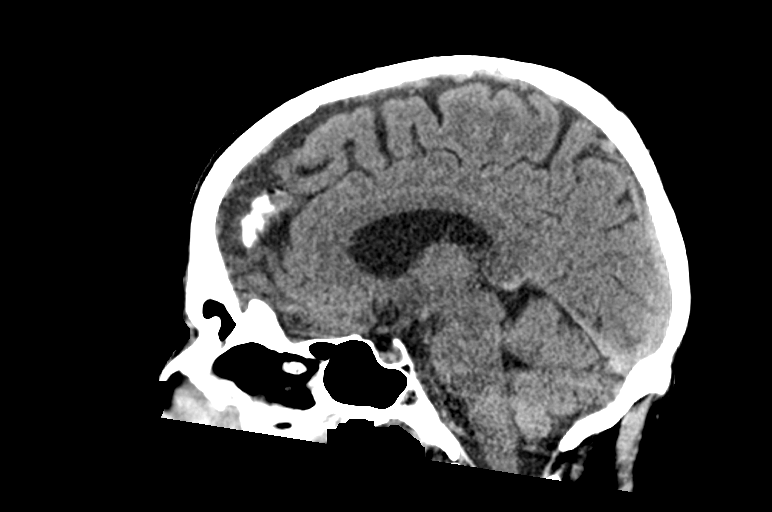
[im 35/53  brain]
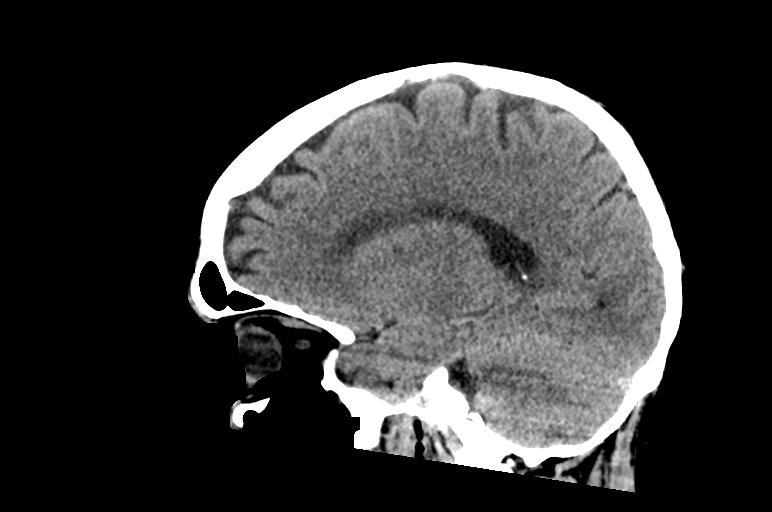

[14 of 47 positions shown; findings below may reference images not displayed]

FINDINGS: Brain: Stable cerebral volume from last year, within normal limits
for age. No midline shift, ventriculomegaly, mass effect, evidence
of mass lesion, intracranial hemorrhage or evidence of cortically
based acute infarction. Gray-white matter differentiation is within
normal limits throughout the brain.

Vascular: No suspicious intracranial vascular hyperdensity.

Skull: No acute osseous abnormality identified.

Sinuses/Orbits: Moderate bilateral ethmoid mucosal thickening is
stable. No sinus fluid level. Tympanic cavities and mastoids remain
clear.

Other: Visualized orbit soft tissues are within normal limits.
Visualized scalp soft tissues are within normal limits.
IMPRESSION: 1. Stable and normal for age non contrast CT appearance of the
brain.
2. Chronic ethmoid sinus disease.

## 2020-07-25 IMAGING — MR MR MRA HEAD W/O CM
15 of 23 series · 29 of 48 positions shown · non-contrast
Comparison: None.

CLINICAL DATA: Dizziness, vision changes



[Series 13: DWI · axial · 3.0mm · 1.36mm/px · z∈[-6,+143]mm · 3 of 104 slices shown (1 of 4)]
[im 1/104]
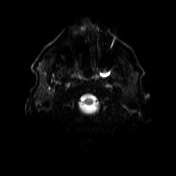
[im 52/104]
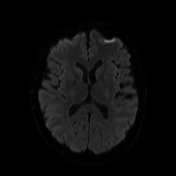
[im 104/104]
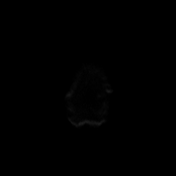

[Series 14: DWI · axial · 3.0mm · 1.36mm/px · 1 of 52 slices shown (2 of 4)]
[im 1/52]
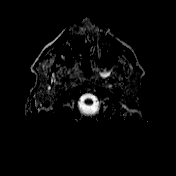

[Series 15: T1 · sagittal · 5.0mm · 0.75mm/px · 1 of 24 slices shown (1 of 2)]
[im 1/24]
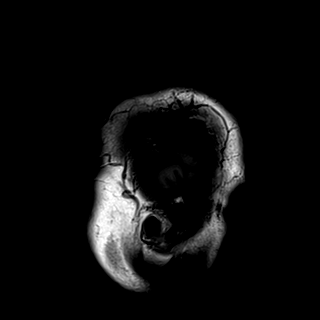

[Series 16: T2 · axial · 5.0mm · 0.62mm/px · 1 of 26 slices shown (1 of 2)]
[im 1/26]
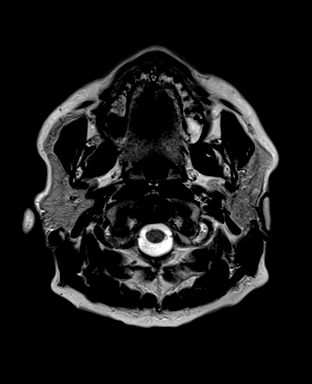

[Series 18: swi_images · axial · 3.0mm · 0.75mm/px · 1 of 56 slices shown]
[im 1/56]
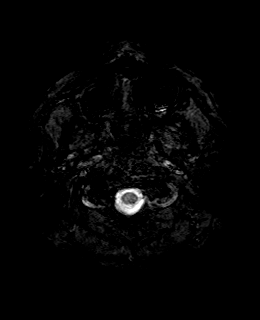

[Series 19: T1 · axial · 1.0mm · 0.94mm/px · z∈[-15,+140]mm · 6 of 160 slices shown (2 of 2)]
[im 1/160]
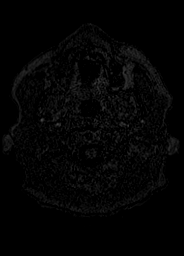
[im 32/160]
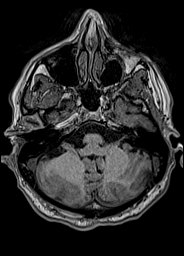
[im 64/160]
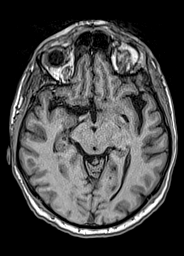
[im 96/160]
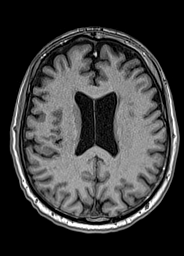
[im 128/160]
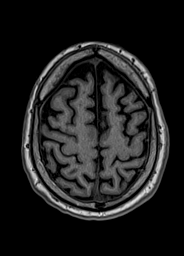
[im 160/160]
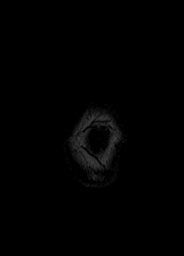

[Series 20: FLAIR · axial · 3.0mm · 0.75mm/px · z∈[-10,+139]mm · 2 of 52 slices shown]
[im 1/52]
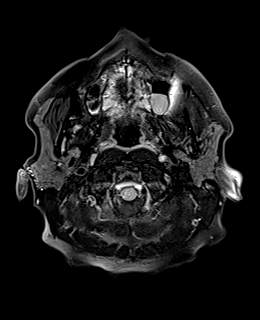
[im 52/52]
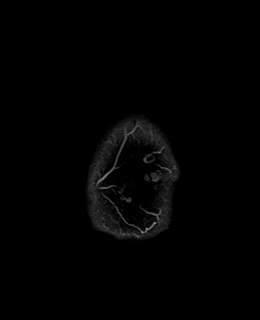

[Series 21: DWI · coronal · 5.0mm · 1.31mm/px · 2 of 68 slices shown (3 of 4)]
[im 1/68]
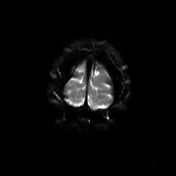
[im 68/68]
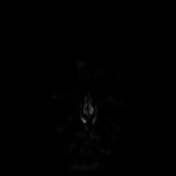

[Series 22: DWI · coronal · 5.0mm · 1.31mm/px · 1 of 34 slices shown (4 of 4)]
[im 1/34]
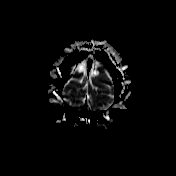

[Series 23: T2 · coronal · 5.0mm · 0.57mm/px · 1 of 24 slices shown (2 of 2)]
[im 1/24]
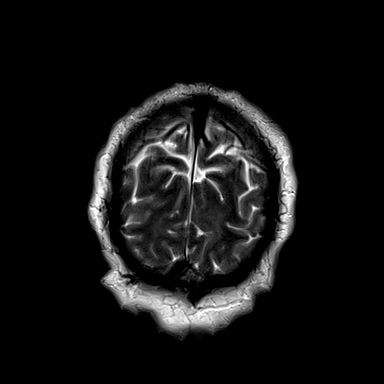

[Series 26: tof_2d_tra · axial · 3.5mm · 0.43mm/px · z∈[-194,-50]mm · 2 of 60 slices shown (1 of 2)]
[im 1/60]
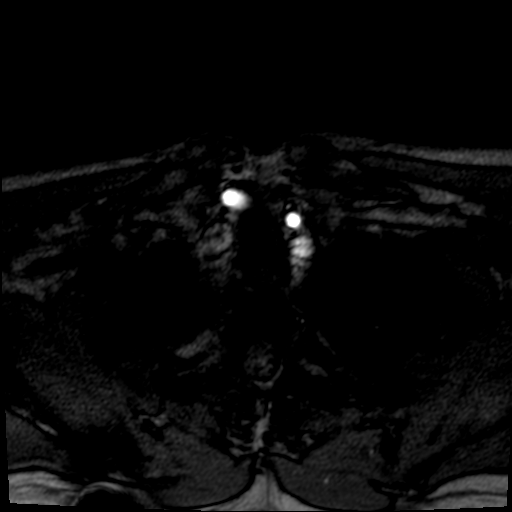
[im 60/60]
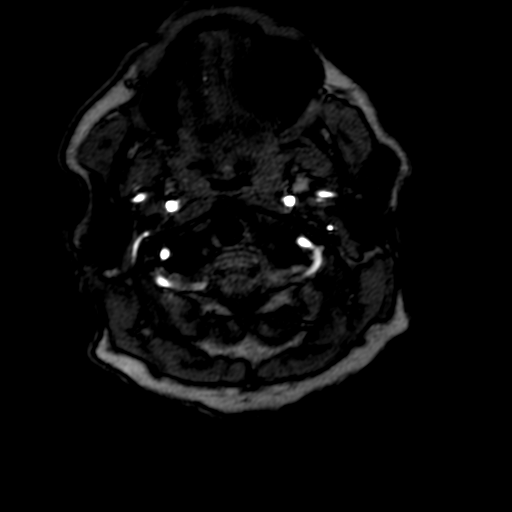

[Series 26: tof_2d_tra · axial · 3.5mm · 0.43mm/px · z∈[-194,-50]mm · 2 of 60 slices shown (2 of 2)]
[im 1/60]
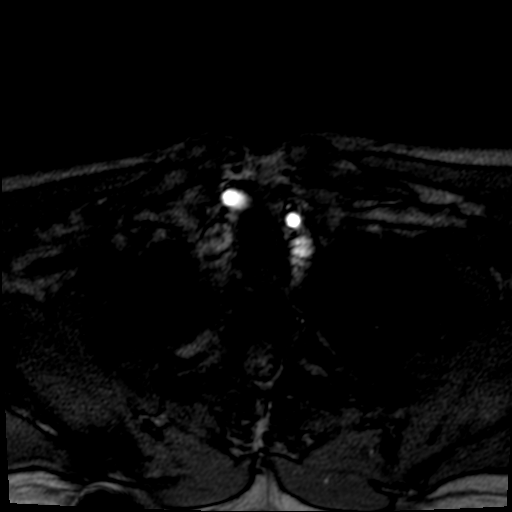
[im 60/60]
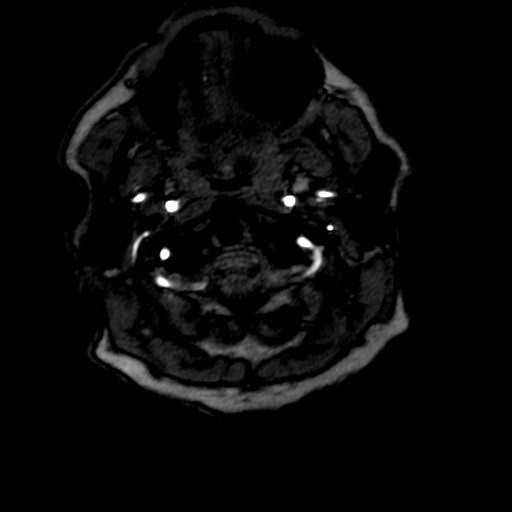

[Series 29: tof_2d_tra_mip_tra · axial · 148.1mm · 0.43mm/px · 1 of 1 slices shown (1 of 2)]
[im 1/1]
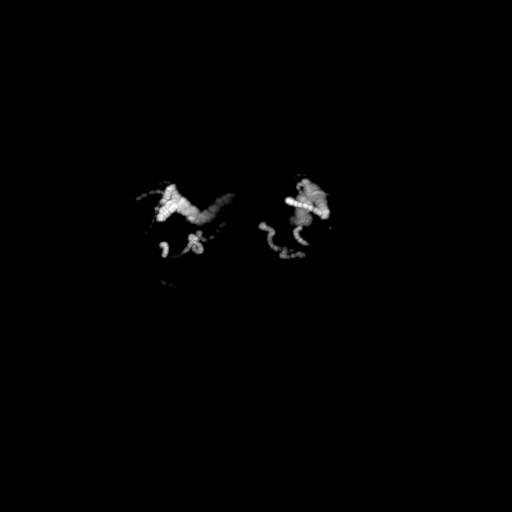

[Series 29: tof_2d_tra_mip_tra · axial · 148.1mm · 0.43mm/px · 1 of 1 slices shown (2 of 2)]
[im 1/1]
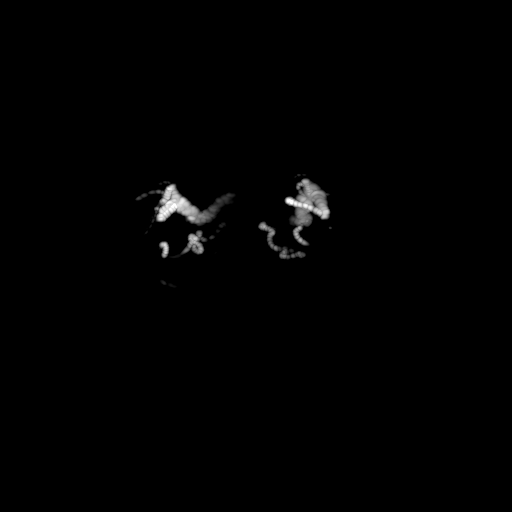

[Series 30: tof_3d_multi-slab-bifurcation · axial · 1.0mm · 0.26mm/px · z∈[-168,-66]mm · 4 of 104 slices shown]
[im 1/104]
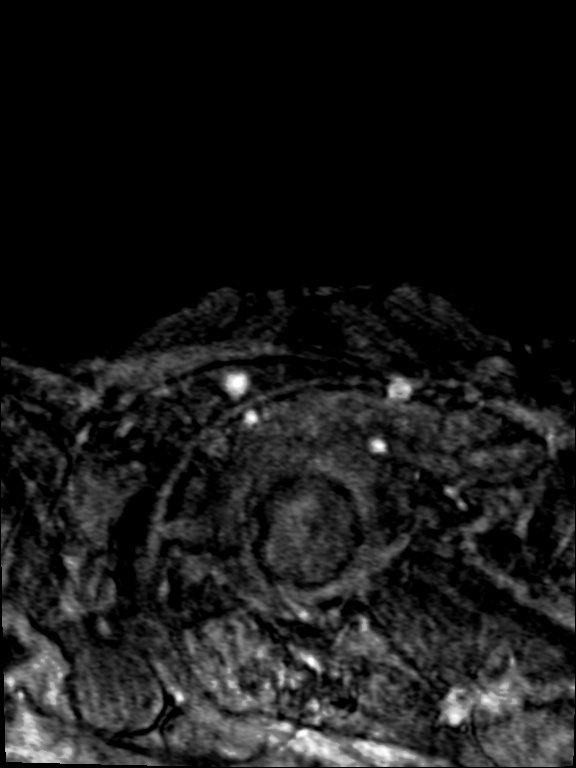
[im 35/104]
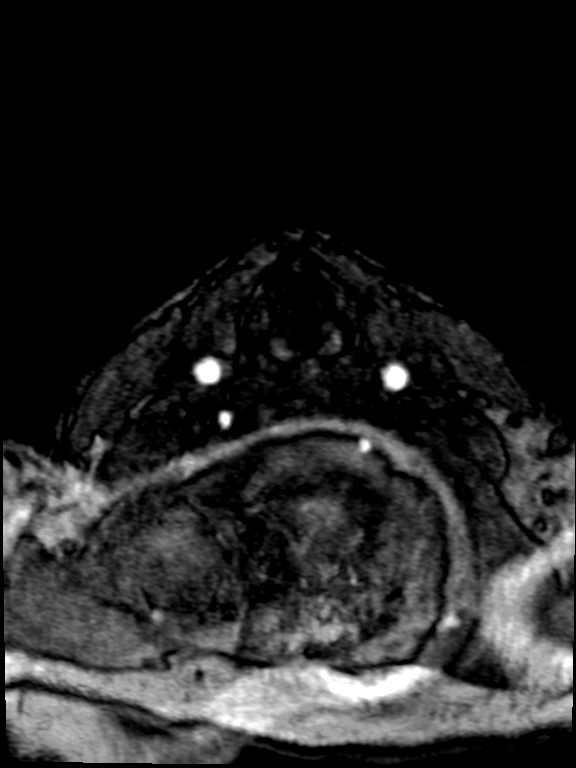
[im 69/104]
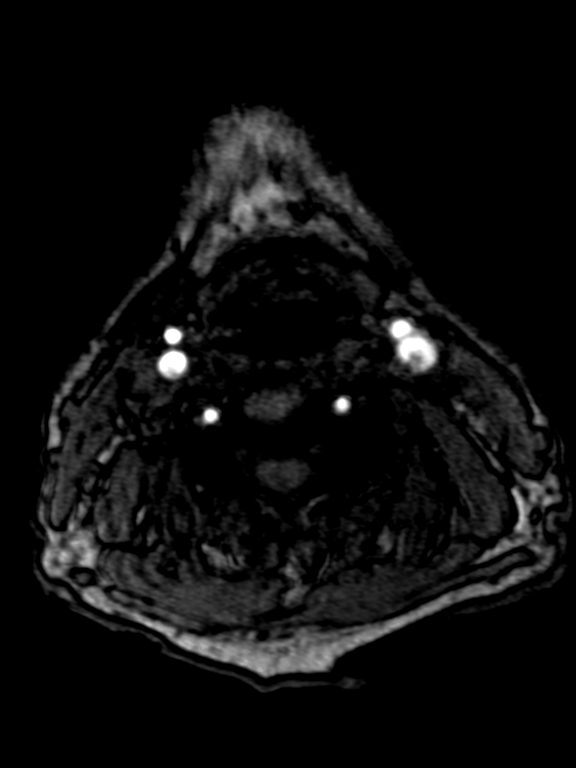
[im 104/104]
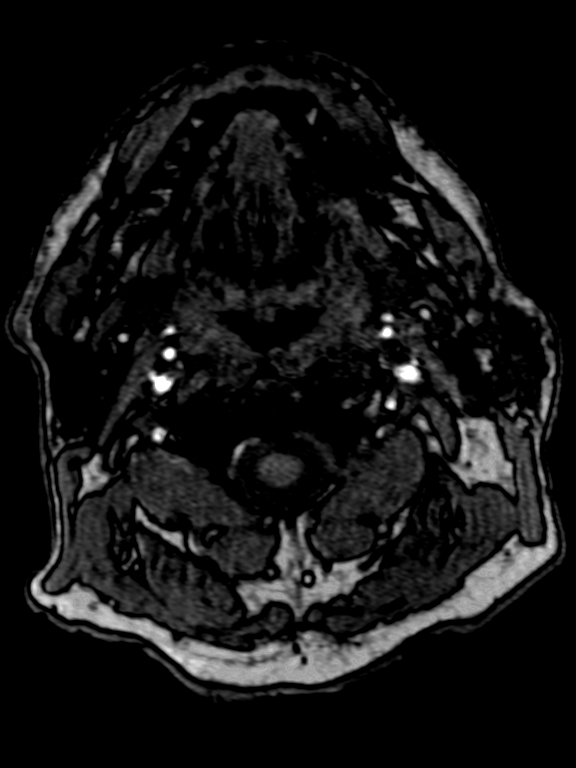

[29 of 48 positions shown; findings below may reference images not displayed]

FINDINGS: MRI HEAD

Brain: There is no acute infarction or intracranial hemorrhage.
There is no intracranial mass, mass effect, or edema. There is no
hydrocephalus or extra-axial fluid collection. Ventricles and sulci
are within normal limits in size and configuration. Patchy foci of
T2 hyperintensity in the supratentorial white matter are nonspecific
but may reflect mild chronic microvascular ischemic changes.

Vascular: Major vessel flow voids at the skull base are preserved.

Skull and upper cervical spine: Normal marrow signal is preserved.

Sinuses/Orbits: Patchy paranasal sinus mucosal thickening. Orbits
are unremarkable.

Other: Sella is unremarkable.  Mastoid air cells are clear.

MRA HEAD

Intracranial internal carotid arteries are patent. Middle and
anterior cerebral arteries are patent. Intracranial vertebral
arteries, basilar artery, posterior cerebral arteries are patent.
Bilateral posterior communicating arteries are present. There is no
significant stenosis or aneurysm.

MRA NECK

Common, internal, and external carotid arteries are patent.
Codominant vertebral arteries are patent. No hemodynamically
significant stenosis.
IMPRESSION: No acute infarction, hemorrhage, or mass. Probable mild chronic
microvascular ischemic changes.

No large vessel occlusion, hemodynamically significant stenosis, or
evidence of dissection.

## 2020-07-25 MED ORDER — MECLIZINE HCL 50 MG PO TABS: 50.0000 mg | ORAL_TABLET | Freq: Three times a day (TID) | ORAL | 0 refills | Status: AC | PRN

## 2020-07-25 MED ORDER — ASPIRIN EC 81 MG PO TBEC: 81.0000 mg | DELAYED_RELEASE_TABLET | Freq: Every day | ORAL | 11 refills | Status: AC
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
## 2020-07-25 NOTE — ED Triage Notes (Signed)
Patient was up at 0400, noticed he 'was falling all over the place' and 'my vision was like black spots when I was standing up and sitting down' and L arm numbness/ tingling. States he felt a 'little wonky' yesterday evening as well, off-balance a little with the vision going in and out in his R eye around 6:00pm. States his vision is okay right now. N/V in the parking lot, diarrhea this AM. Denies fever.

## 2020-07-25 NOTE — Discharge Instructions (Addendum)
We saw you in the ER for symptoms that are concerning for possible mini strokes.  Start taking aspirin every day.  Likely the MRI and the blood vessels going to your brain look clean right now.  Please call the neurologist to set up an outpatient follow-up with them.  The other possibility includes that you might have had vertigo.  Take the medications prescribed for symptom control.  The neurologist will be able to help further with it as well if they think that you have vertigo.  Return to the ER immediately start having one-sided weakness, numbness, slurred speech, sudden vision loss.

## 2020-07-25 NOTE — ED Notes (Signed)
Pt transported to MRI via stretcher.  

## 2020-08-10 ENCOUNTER — Encounter: Payer: Self-pay | Admitting: Neurology

## 2020-08-10 ENCOUNTER — Ambulatory Visit (INDEPENDENT_AMBULATORY_CARE_PROVIDER_SITE_OTHER): Payer: BLUE CROSS/BLUE SHIELD | Admitting: Neurology

## 2020-08-10 DIAGNOSIS — G43409 Hemiplegic migraine, not intractable, without status migrainosus: Secondary | ICD-10-CM

## 2020-08-10 HISTORY — DX: Hemiplegic migraine, not intractable, without status migrainosus: G43.409

## 2020-08-10 MED ORDER — TOPIRAMATE 25 MG PO TABS: ORAL_TABLET | ORAL | 3 refills | Status: DC

## 2020-08-10 NOTE — Patient Instructions (Signed)
We will start topamax for headache prevention.  Topamax (topiramate) is a seizure medication that has an FDA approval for seizures and for migraine headache. Potential side effects of this medication include weight loss, cognitive slowing, tingling in the fingers and toes, and carbonated drinks will taste bad. If any significant side effects are noted on this drug, please contact our office.  

## 2020-08-10 NOTE — Progress Notes (Signed)
Reason for visit: Headache, visual disturbance  Referring physician: [place]  Roberto Knapp is a 50 y.o. male  History of present illness:  Roberto Knapp is a 50 year old right-handed white male with a history of tobacco abuse and ADD on Vyvanse.  The patient went to the emergency room on 25 Jul 2020 with onset of dizziness.  He claims that he woke up that morning, and sat up and began to feel somewhat lightheaded, with some degree of a spinning sensation or vertigo.  He initially did not have a headache.  He felt somewhat nauseated and felt fainty.  He also noted black spots in front of the eye on the right.  He decided he was going to try to make it to work and by the time he got to work he was feeling worse.  The dizziness felt more significant, the spots in front of the right eye were more prominent, and he began to have a headache.  The patient also noted that he was feeling somewhat confused and was slurring his speech and he had some clumsiness or weakness of his right arm.  He called his wife to come get him, and he wound up going to the emergency room.  The patient underwent MRI of the brain which showed chronic small vessel changes that were relatively mild, but no acute changes were seen.  MRA of the head and neck were unremarkable.  The patient had symptoms for about 30 to 45 minutes with full clearing at that point.  The patient in retrospect indicates that over the last year or so he has had a total of 4 such episodes, all events are stereotypical, associated with spots in front of the eye, headache, dizziness, and right arm weakness or clumsiness.  The patient reports that he has had headaches off and on throughout his entire life.  The headaches generally occur about once a week, he will take Advil with good improvement.  He indicates that his mother and his maternal grandmother also had some headache.  The patient was told to go on low-dose aspirin in the emergency room which he is now  doing.  He comes to this office for further evaluation.  Past Medical History:  Diagnosis Date  . Drug overdose, intentional [id number])     Past Surgical History:  Procedure Laterality Date  . FASCIOTOMY  09/08/2011   Procedure: FASCIOTOMY;  Surgeon: Sherren Kerns, MD;  Location: [facility] OR;  Service: Vascular;  Laterality: Left;  . FEMORAL-POPLITEAL BYPASS GRAFT  09/08/2011   Procedure: BYPASS GRAFT FEMORAL-POPLITEAL ARTERY;  Surgeon: Sherren Kerns, MD;  Location: [facility] OR;  Service: Vascular;  Laterality: Left;  . NO PAST SURGERIES      Family History  Problem Relation Age of Onset  . Hypertension Father     Social history:  reports that he has been smoking cigarettes. He has a 12.50 pack-year smoking history. He has never used smokeless tobacco. He reports current alcohol use. He reports that he does not use drugs.  Medications:  Prior to Admission medications   Medication Sig Start Date End Date Taking? Authorizing Provider  Acetaminophen (TYLENOL PO) Take 2 tablets by mouth every 6 (six) hours as needed (pain/fever/headache).   Yes [provider]  aspirin EC 81 MG tablet Take 1 tablet (81 mg total) by mouth daily. Swallow whole. 07/25/20  Yes Derwood Kaplan, MD  calcium carbonate (TUMS - DOSED IN MG ELEMENTAL CALCIUM) 500 MG chewable tablet Chew 1  tablet by mouth daily.   Yes [provider]  dimenhyDRINATE (DRAMAMINE) 50 MG tablet Take 50 mg by mouth every 8 (eight) hours as needed.   Yes [provider]  ibuprofen (ADVIL) 600 MG tablet Take 1 tablet (600 mg total) by mouth every 6 (six) hours as needed. 08/10/19  Yes Jacalyn Lefevre, MD  loratadine (CLARITIN) 10 MG tablet Take 10 mg by mouth daily as needed for allergies.   Yes [provider]  meclizine (ANTIVERT) 50 MG tablet Take 1 tablet (50 mg total) by mouth 3 (three) times daily as needed. 07/25/20  Yes Nanavati, Ankit, MD  VYVANSE 70 MG capsule Take 70 mg by mouth every morning. 07/06/20  Yes  [provider]  amoxicillin-clavulanate (AUGMENTIN) 875-125 MG tablet Take 1 tablet by mouth every 12 (twelve) hours. 08/10/19   Jacalyn Lefevre, MD  HYDROcodone-acetaminophen (NORCO/VICODIN) 5-325 MG tablet Take 1 tablet by mouth every 4 (four) hours as needed. 08/10/19   Jacalyn Lefevre, MD  pantoprazole (PROTONIX) 40 MG tablet Take 1 tablet (40 mg total) by mouth daily. 03/07/18   Rolly Salter, MD     No Known Allergies  ROS:  Out of a complete 14 system review of symptoms, the patient complains only of the following symptoms, and all other reviewed systems are negative.  Headache Dizziness Visual disturbance  Blood pressure 110/79, pulse 95, height 5\' 8"  (1.727 m), weight 153 lb 12.8 oz (69.8 kg).  Physical Exam  General: The patient is alert and cooperative at the time of the examination.  Eyes: Pupils are equal, round, and reactive to light. Discs are flat bilaterally.  Neck: The neck is supple, no carotid bruits are noted.  Respiratory: The respiratory examination is clear.  Cardiovascular: The cardiovascular examination reveals a regular rate and rhythm, no obvious murmurs or rubs are noted.  Skin: Extremities are without significant edema.  Neurologic Exam  Mental status: The patient is alert and oriented x 3 at the time of the examination. The patient has apparent normal recent and remote memory, with an apparently normal attention span and concentration ability.  Cranial nerves: Facial symmetry is present. There is good sensation of the face to pinprick and soft touch bilaterally. The strength of the facial muscles and the muscles to head turning and shoulder shrug are normal bilaterally. Speech is well enunciated, no aphasia or dysarthria is noted. Extraocular movements are full. Visual fields are full. The tongue is midline, and the patient has symmetric elevation of the soft palate. No obvious hearing deficits are noted.  Motor: The motor testing reveals 5  over 5 strength of all 4 extremities. Good symmetric motor tone is noted throughout.  Sensory: Sensory testing is intact to pinprick, soft touch, vibration sensation, and position sense on all 4 extremities. No evidence of extinction is noted.  Coordination: Cerebellar testing reveals good finger-nose-finger and heel-to-shin bilaterally.  Gait and station: Gait is normal. Tandem gait is normal. Romberg is negative. No drift is seen.  Reflexes: Deep tendon reflexes are symmetric and normal bilaterally. Toes are downgoing bilaterally.   MRI brain 07/25/20:  IMPRESSION: No acute infarction, hemorrhage, or mass. Probable mild chronic microvascular ischemic changes.  No large vessel occlusion, hemodynamically significant stenosis, or evidence of dissection.  * MRI scan images were reviewed online. I agree with the written report.   MRA head and neck 07/25/20:  IMPRESSION: No acute infarction, hemorrhage, or mass. Probable mild chronic microvascular ischemic changes.  No large vessel occlusion, hemodynamically significant stenosis,  or evidence of dissection.  MRA HEAD  Intracranial internal carotid arteries are patent. Middle and anterior cerebral arteries are patent. Intracranial vertebral arteries, basilar artery, posterior cerebral arteries are patent. Bilateral posterior communicating arteries are present. There is no significant stenosis or aneurysm.   Assessment/Plan:  1.  Episode of dizziness, visual disturbance, headache, and right arm weakness, probable neurologic migraine  The patient has had several events that are similar to 1 another associated with spots up on the right eye, dizziness or vertigo, clouding of consciousness and slurring of speech, and right arm weakness or clumsiness.  These events likely represent migraine, the patient has headaches that have been occurring throughout his entire life.  The patient will be placed on Topamax, he will remain on  aspirin.  He will follow-up here in 4 months.  Advil seems to help his headaches when they do come on, he can use this if needed.  Marlan Palau MD 08/10/2020 9:44 AM  [facility] [address] [place], [place] [address]  Phone [phone] Fax [phone]

## 2020-09-12 DIAGNOSIS — R350 Frequency of micturition: Secondary | ICD-10-CM | POA: Diagnosis not present

## 2020-09-15 DIAGNOSIS — Z122 Encounter for screening for malignant neoplasm of respiratory organs: Secondary | ICD-10-CM | POA: Diagnosis not present

## 2020-09-15 DIAGNOSIS — G43909 Migraine, unspecified, not intractable, without status migrainosus: Secondary | ICD-10-CM | POA: Diagnosis not present

## 2020-09-15 DIAGNOSIS — R0681 Apnea, not elsewhere classified: Secondary | ICD-10-CM | POA: Diagnosis not present

## 2020-09-15 DIAGNOSIS — Z23 Encounter for immunization: Secondary | ICD-10-CM | POA: Diagnosis not present

## 2020-09-15 DIAGNOSIS — I679 Cerebrovascular disease, unspecified: Secondary | ICD-10-CM | POA: Diagnosis not present

## 2020-09-15 DIAGNOSIS — R69 Illness, unspecified: Secondary | ICD-10-CM | POA: Diagnosis not present

## 2020-09-15 DIAGNOSIS — M75111 Incomplete rotator cuff tear or rupture of right shoulder, not specified as traumatic: Secondary | ICD-10-CM | POA: Diagnosis not present

## 2020-09-15 DIAGNOSIS — Z0000 Encounter for general adult medical examination without abnormal findings: Secondary | ICD-10-CM | POA: Diagnosis not present

## 2020-09-15 DIAGNOSIS — Z13220 Encounter for screening for lipoid disorders: Secondary | ICD-10-CM | POA: Diagnosis not present

## 2020-09-15 DIAGNOSIS — Z125 Encounter for screening for malignant neoplasm of prostate: Secondary | ICD-10-CM | POA: Diagnosis not present

## 2020-09-20 ENCOUNTER — Other Ambulatory Visit: Payer: Self-pay | Admitting: Family Medicine

## 2020-09-20 DIAGNOSIS — Z122 Encounter for screening for malignant neoplasm of respiratory organs: Secondary | ICD-10-CM

## 2020-10-05 ENCOUNTER — Other Ambulatory Visit: Payer: Self-pay

## 2020-10-05 ENCOUNTER — Ambulatory Visit
Admission: RE | Admit: 2020-10-05 | Discharge: 2020-10-05 | Disposition: A | Discharge status: Home / Self Care | Payer: 59 | Source: Ambulatory Visit | Attending: Family Medicine | Admitting: Family Medicine

## 2020-10-05 DIAGNOSIS — Z87891 Personal history of nicotine dependence: Secondary | ICD-10-CM | POA: Diagnosis not present

## 2020-10-05 DIAGNOSIS — G4719 Other hypersomnia: Secondary | ICD-10-CM | POA: Diagnosis not present

## 2020-10-05 DIAGNOSIS — G2581 Restless legs syndrome: Secondary | ICD-10-CM | POA: Diagnosis not present

## 2020-10-05 DIAGNOSIS — J432 Centrilobular emphysema: Secondary | ICD-10-CM | POA: Diagnosis not present

## 2020-10-05 DIAGNOSIS — R918 Other nonspecific abnormal finding of lung field: Secondary | ICD-10-CM | POA: Diagnosis not present

## 2020-10-05 DIAGNOSIS — Z122 Encounter for screening for malignant neoplasm of respiratory organs: Secondary | ICD-10-CM

## 2020-10-05 IMAGING — CT CT CHEST LUNG CANCER SCREENING LOW DOSE W/O CM
1 of 3 series · 10 of 40 positions shown, 13 images · non-contrast
Comparison: None.

CLINICAL DATA: 49-year-old male with 31 pack-year history of
smoking. Lung cancer screening.

EXAM:
CT CHEST WITHOUT CONTRAST LOW-DOSE FOR LUNG CANCER SCREENING
TECHNIQUE: Multidetector CT imaging of the chest was performed following the
standard protocol without IV contrast.

[ct lung segmentation data · axial · 0.71mm/px · z∈[-283,-283]mm · 10 of 298 frames shown]
[frame 1/298  mediastinal]
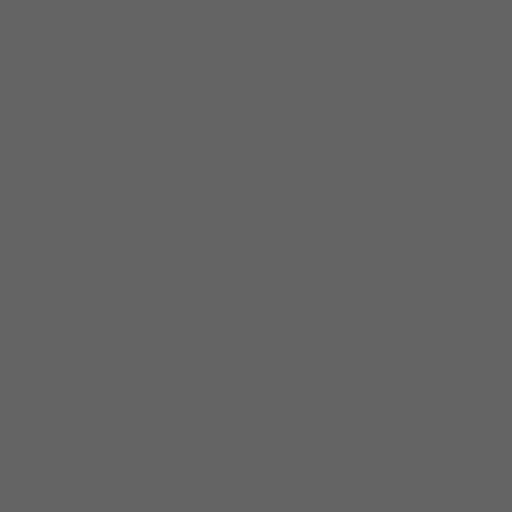
[frame 1/298  lung]
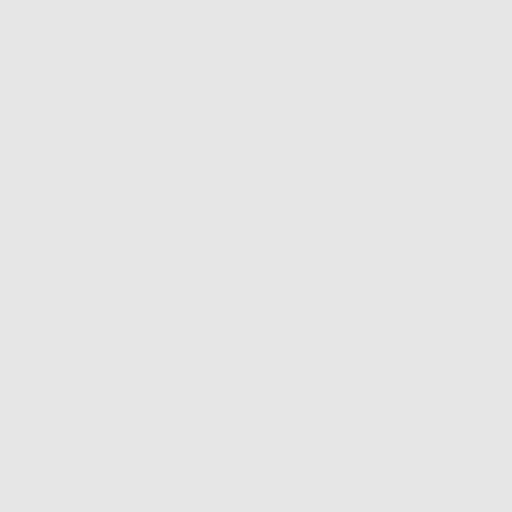
[frame 34/298  lung]
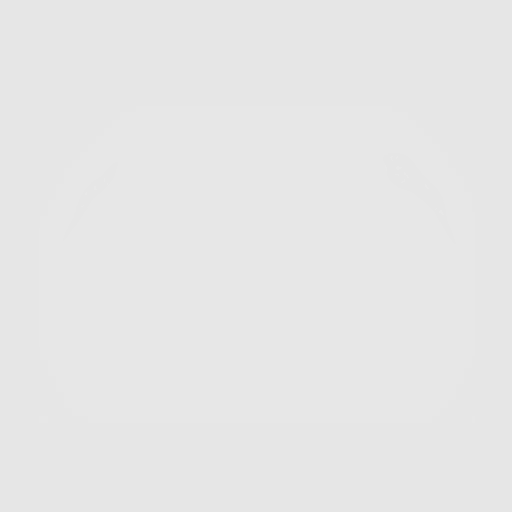
[frame 67/298  lung]
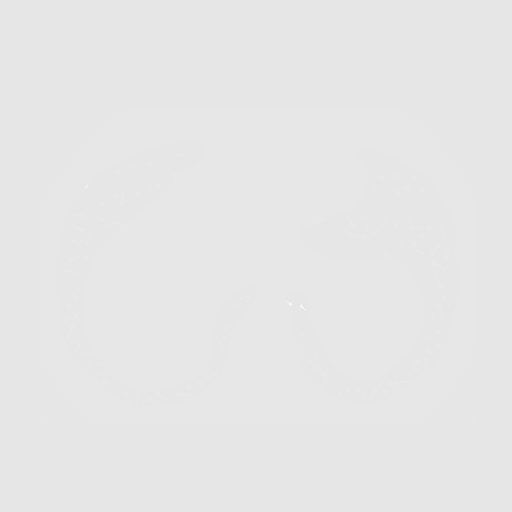
[frame 100/298  lung]
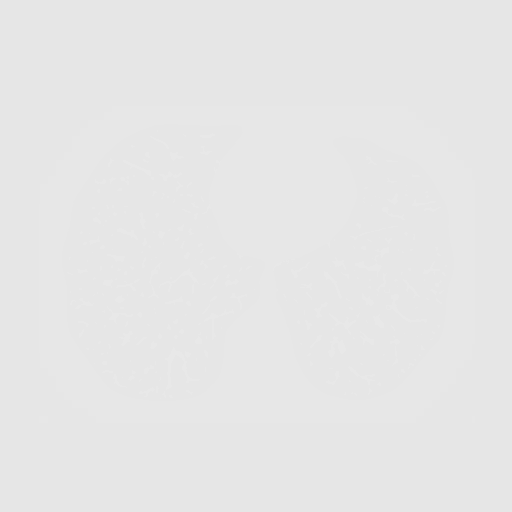
[frame 133/298  mediastinal]
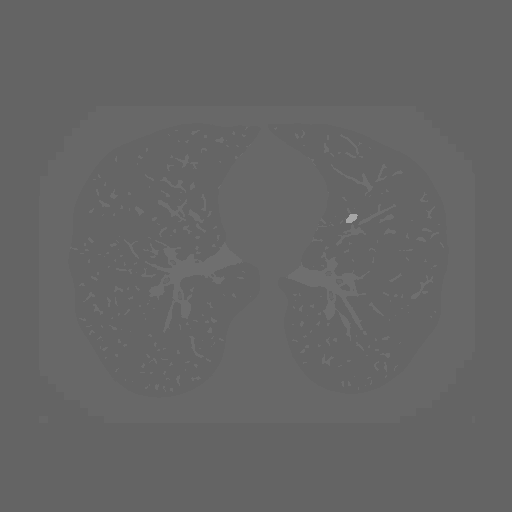
[frame 133/298  lung]
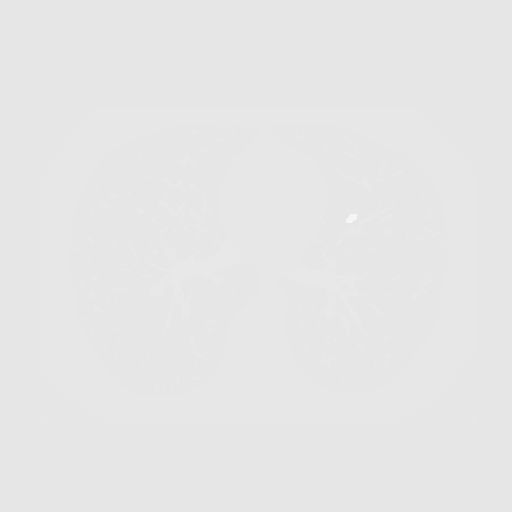
[frame 166/298  lung]
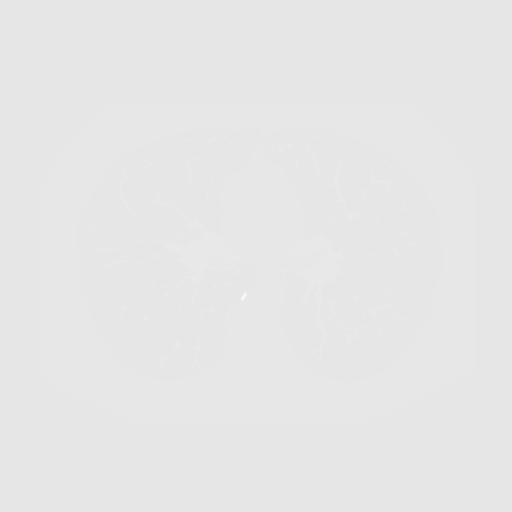
[frame 199/298  lung]
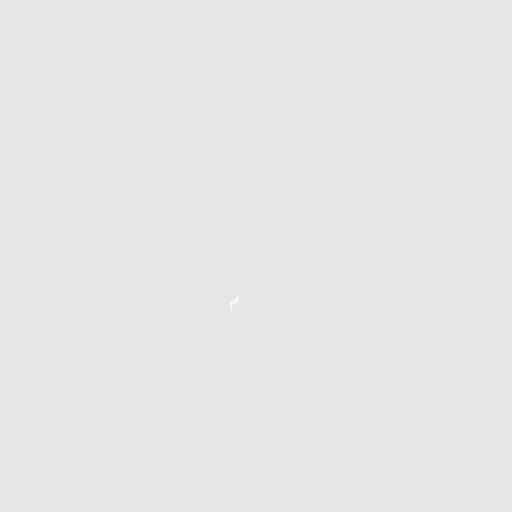
[frame 232/298  lung]
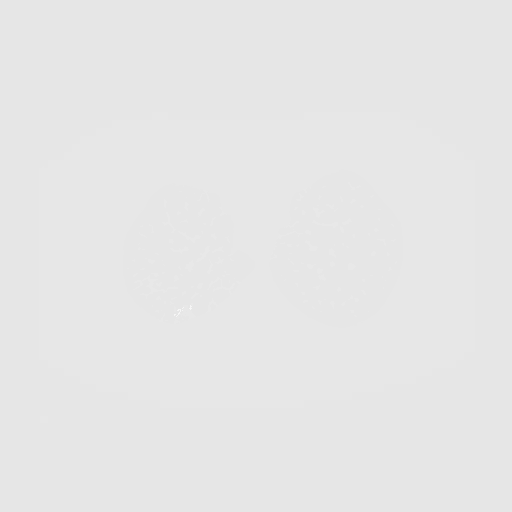
[frame 265/298  mediastinal]
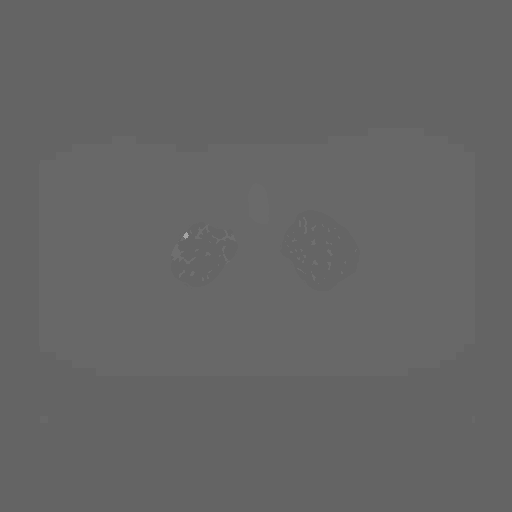
[frame 265/298  lung]
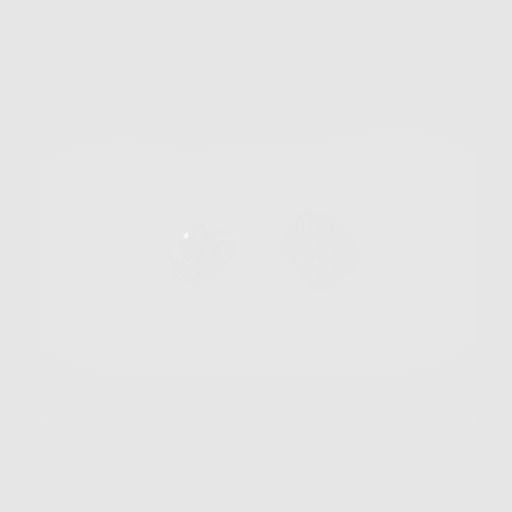
[frame 298/298  lung]
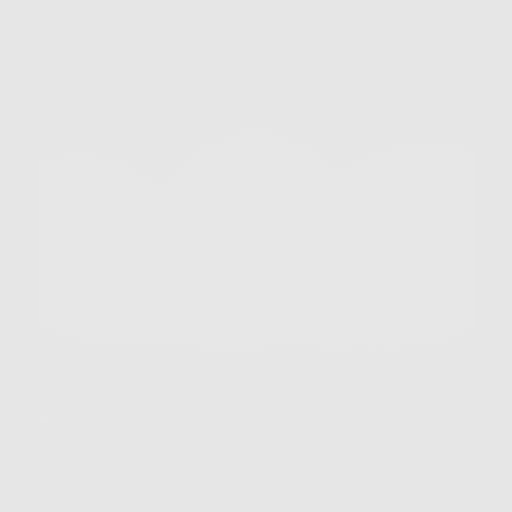

[10 of 40 positions shown; findings below may reference images not displayed]

FINDINGS: Cardiovascular: The heart size is normal. No substantial pericardial
effusion. No discernible coronary artery calcification on this
non-gated study. Insert calcium thoracic 9

Mediastinum/Nodes: No mediastinal lymphadenopathy. No evidence for
gross hilar lymphadenopathy although assessment is limited by the
lack of intravenous contrast on today's study. The esophagus has
normal imaging features. There is no axillary lymphadenopathy.

Lungs/Pleura: Centrilobular and paraseptal emphysema evident. Focal
platelike area of apparent atelectasis or scarring noted posterior
right upper lobe. Incorporated into this region are several areas of
nodular opacity measuring up to 11.8 mm. Additional scattered tiny
bilateral pulmonary nodules measure up to a bowel 3-4 mm. No focal
airspace consolidation. No pleural effusion.

Upper Abdomen: Unremarkable.

Musculoskeletal: No worrisome lytic or sclerotic osseous
abnormality.
IMPRESSION: 1. Lung-RADS 4A, suspicious. Follow up low-dose chest CT without
contrast in 3 months (please use the following order, "CT CHEST LCS
NODULE FOLLOW-UP W/O CM") is recommended. Alternatively, PET may be
considered when there is a solid component 8mm or larger. This
classification is based on changes in the posterior right upper lobe
that are likely secondary to chronic atelectasis or scarring but
have a dominant nodular component.
2.  Emphysema. ([C4]-[C4])

These results will be called to the ordering clinician or
representative by the Radiologist Assistant, and communication
documented in the PACS or [REDACTED].

## 2020-10-14 DIAGNOSIS — R972 Elevated prostate specific antigen [PSA]: Secondary | ICD-10-CM | POA: Diagnosis not present

## 2020-10-14 DIAGNOSIS — E7800 Pure hypercholesterolemia, unspecified: Secondary | ICD-10-CM | POA: Diagnosis not present

## 2020-10-28 DIAGNOSIS — G4733 Obstructive sleep apnea (adult) (pediatric): Secondary | ICD-10-CM | POA: Diagnosis not present

## 2020-10-28 DIAGNOSIS — I4891 Unspecified atrial fibrillation: Secondary | ICD-10-CM | POA: Diagnosis not present

## 2020-11-16 DIAGNOSIS — S99921A Unspecified injury of right foot, initial encounter: Secondary | ICD-10-CM | POA: Diagnosis not present

## 2020-12-07 ENCOUNTER — Ambulatory Visit: Payer: BLUE CROSS/BLUE SHIELD | Admitting: Adult Health

## 2020-12-14 DIAGNOSIS — E7800 Pure hypercholesterolemia, unspecified: Secondary | ICD-10-CM | POA: Diagnosis not present

## 2021-01-08 DIAGNOSIS — G4733 Obstructive sleep apnea (adult) (pediatric): Secondary | ICD-10-CM | POA: Diagnosis not present

## 2021-01-11 ENCOUNTER — Other Ambulatory Visit: Payer: Self-pay | Admitting: Family Medicine

## 2021-01-11 DIAGNOSIS — R911 Solitary pulmonary nodule: Secondary | ICD-10-CM

## 2021-01-17 ENCOUNTER — Ambulatory Visit (INDEPENDENT_AMBULATORY_CARE_PROVIDER_SITE_OTHER): Payer: 59 | Admitting: Adult Health

## 2021-01-17 ENCOUNTER — Encounter: Payer: Self-pay | Admitting: Adult Health

## 2021-01-17 VITALS — BP 114/72 | HR 68 | Ht 68.0 in | Wt 154.0 lb

## 2021-01-17 DIAGNOSIS — G43409 Hemiplegic migraine, not intractable, without status migrainosus: Secondary | ICD-10-CM | POA: Diagnosis not present

## 2021-01-17 MED ORDER — TOPIRAMATE 25 MG PO TABS: 75.0000 mg | ORAL_TABLET | Freq: Every day | ORAL | 3 refills | Status: DC

## 2021-01-17 MED ORDER — SUMATRIPTAN SUCCINATE 100 MG PO TABS: 100.0000 mg | ORAL_TABLET | Freq: Once | ORAL | 11 refills | Status: DC | PRN

## 2021-01-17 NOTE — Patient Instructions (Addendum)
Your Plan:  Continue topiramate 75 mg nightly  Start sumatriptan 100mg  that you will take at the onset of your migraine headache. If needed, you can repeat one time after 2 hours. Do not take more than 2 tablets in a 24 hour period.  If any difficulty tolerating sumatriptan, please let me know as we can try other forms. If not beneficial, also please let me know    Follow-up in 6 months or call earlier if needed     Thank you for coming to see at [facility]. I hope we have been able to provide you high quality care today.  You may receive a patient satisfaction survey over the next few weeks. We would appreciate your feedback and comments so that we may continue to improve ourselves and the health of our patients.

## 2021-01-17 NOTE — Progress Notes (Signed)
Reason for visit: Headache, visual disturbance   Chief Complaint  Patient presents with   Follow-up    RM 3 alone Pt is well, migraines have improved with topamax.  Has about 2-3 a month on average, but not as intense as before.       Roberto Knapp is a 50 y.o. male  History of present illness:  Update 01/17/2021 JM: Returns for follow-up visit after prior visit with Dr. Jannifer Franklin 5 months ago.  Started Topamax with current dose 75 mg nightly for likely migraine headaches at prior visit.  Reports improvement of migraine headaches currently experiencing 2 to 1-month with decreased intensity.  Typically last 30 to 40 minutes but will need to find a quite place to relax until migraine subsides.  Occasionally accompanied by mild dizziness but no reoccurring visual changes, weakness or speech changes associated with migraine headaches.  No further concerns at this time.     History provided for reference purposes only Consult visit 08/10/2020 Dr. Jannifer Franklin: Roberto Knapp is a 50 year old right-handed white male with a history of tobacco abuse and ADD on Vyvanse.  The patient went to the emergency room on 25 Jul 2020 with onset of dizziness.  He claims that he woke up that morning, and sat up and began to feel somewhat lightheaded, with some degree of a spinning sensation or vertigo.  He initially did not have a headache.  He felt somewhat nauseated and felt fainty.  He also noted black spots in front of the eye on the right.  He decided he was going to try to make it to work and by the time he got to work he was feeling worse.  The dizziness felt more significant, the spots in front of the right eye were more prominent, and he began to have a headache.  The patient also noted that he was feeling somewhat confused and was slurring his speech and he had some clumsiness or weakness of his right arm.  He called his wife to come get him, and he wound up going to the emergency room.  The patient underwent MRI of  the brain which showed chronic small vessel changes that were relatively mild, but no acute changes were seen.  MRA of the head and neck were unremarkable.  The patient had symptoms for about 30 to 45 minutes with full clearing at that point.  The patient in retrospect indicates that over the last year or so he has had a total of 4 such episodes, all events are stereotypical, associated with spots in front of the eye, headache, dizziness, and right arm weakness or clumsiness.  The patient reports that he has had headaches off and on throughout his entire life.  The headaches generally occur about once a week, he will take Advil with good improvement.  He indicates that his mother and his maternal grandmother also had some headache.  The patient was told to go on low-dose aspirin in the emergency room which he is now doing.  He comes to this office for further evaluation.  Past Medical History:  Diagnosis Date   Drug overdose, intentional ([place])    Hemiplegic migraine 08/10/2020    Past Surgical History:  Procedure Laterality Date   FASCIOTOMY  09/08/2011   Procedure: FASCIOTOMY;  Surgeon: Elam Dutch, MD;  Location: [facility] OR;  Service: Vascular;  Laterality: Left;   FEMORAL-POPLITEAL BYPASS GRAFT  09/08/2011   Procedure: BYPASS GRAFT FEMORAL-POPLITEAL ARTERY;  Surgeon: Elam Dutch, MD;  Location: [facility] OR;  Service: Vascular;  Laterality: Left;   NO PAST SURGERIES      Family History  Problem Relation Age of Onset   Hypertension Father     Social history:  reports that he has been smoking cigarettes. He has a 12.50 pack-year smoking history. He has never used smokeless tobacco. He reports current alcohol use of about 28.0 standard drinks per week. He reports that he does not use drugs.  Medications:  Prior to Admission medications   Medication Sig Start Date End Date Taking? Authorizing Provider  Acetaminophen (TYLENOL PO) Take 2 tablets by mouth every 6 (six) hours as needed  (pain/fever/headache).   Yes [provider]  aspirin EC 81 MG tablet Take 1 tablet (81 mg total) by mouth daily. Swallow whole. 07/25/20  Yes Derwood Kaplan, MD  calcium carbonate (TUMS - DOSED IN MG ELEMENTAL CALCIUM) 500 MG chewable tablet Chew 1 tablet by mouth daily.   Yes [provider]  dimenhyDRINATE (DRAMAMINE) 50 MG tablet Take 50 mg by mouth every 8 (eight) hours as needed.   Yes [provider]  ibuprofen (ADVIL) 600 MG tablet Take 1 tablet (600 mg total) by mouth every 6 (six) hours as needed. 08/10/19  Yes Jacalyn Lefevre, MD  loratadine (CLARITIN) 10 MG tablet Take 10 mg by mouth daily as needed for allergies.   Yes [provider]  meclizine (ANTIVERT) 50 MG tablet Take 1 tablet (50 mg total) by mouth 3 (three) times daily as needed. 07/25/20  Yes Nanavati, Ankit, MD  VYVANSE 70 MG capsule Take 70 mg by mouth every morning. 07/06/20  Yes [provider]  amoxicillin-clavulanate (AUGMENTIN) 875-125 MG tablet Take 1 tablet by mouth every 12 (twelve) hours. 08/10/19   Jacalyn Lefevre, MD  HYDROcodone-acetaminophen (NORCO/VICODIN) 5-325 MG tablet Take 1 tablet by mouth every 4 (four) hours as needed. 08/10/19   Jacalyn Lefevre, MD  pantoprazole (PROTONIX) 40 MG tablet Take 1 tablet (40 mg total) by mouth daily. 03/07/18   Rolly Salter, MD     No Known Allergies  ROS:  Out of a complete 14 system review of symptoms, the patient complains only of the following symptoms, and all other reviewed systems are negative.  Headache Dizziness   Blood pressure 114/72, pulse 68, height 5\' 8"  (1.727 m), weight 154 lb (69.9 kg).  Physical Exam  General: The patient is very pleasant alert and cooperative at the time of the examination.  Eyes: Pupils are equal, round, and reactive to light. Discs are flat bilaterally.  Neck: The neck is supple, no carotid bruits are noted.  Respiratory: The respiratory examination is clear.  Cardiovascular: The  cardiovascular examination reveals a regular rate and rhythm, no obvious murmurs or rubs are noted.  Skin: Extremities are without significant edema.  Neurologic Exam  Mental status: The patient is alert and oriented x 3 at the time of the examination. The patient has apparent normal recent and remote memory, with an apparently normal attention span and concentration ability.  Cranial nerves: Facial symmetry is present. There is good sensation of the face to pinprick and soft touch bilaterally. The strength of the facial muscles and the muscles to head turning and shoulder shrug are normal bilaterally. Speech is well enunciated, no aphasia or dysarthria is noted. Extraocular movements are full. Visual fields are full. The tongue is midline, and the patient has symmetric elevation of the soft palate. No obvious hearing deficits are noted.  Motor: The motor testing reveals  5 over 5 strength of all 4 extremities. Good symmetric motor tone is noted throughout.  Sensory: Sensory testing is intact to pinprick, soft touch, vibration sensation, and position sense on all 4 extremities. No evidence of extinction is noted.  Coordination: Cerebellar testing reveals good finger-nose-finger and heel-to-shin bilaterally.  Gait and station: Gait is normal. Tandem gait is normal. Romberg is negative. No drift is seen.  Reflexes: Deep tendon reflexes are symmetric and normal bilaterally. Toes are downgoing bilaterally.     Pertinent imaging:  MRI brain 07/25/20: IMPRESSION: No acute infarction, hemorrhage, or mass. Probable mild chronic microvascular ischemic changes.   No large vessel occlusion, hemodynamically significant stenosis, or evidence of dissection.   MRA head and neck 07/25/20: IMPRESSION: No acute infarction, hemorrhage, or mass. Probable mild chronic microvascular ischemic changes.   No large vessel occlusion, hemodynamically significant stenosis, or evidence of dissection.  MRA  HEAD   Intracranial internal carotid arteries are patent. Middle and anterior cerebral arteries are patent. Intracranial vertebral arteries, basilar artery, posterior cerebral arteries are patent. Bilateral posterior communicating arteries are present. There is no significant stenosis or aneurysm.     Assessment/Plan:  1.  Episode of dizziness, visual disturbance, headache, and right arm weakness, probable neurologic migraine  -Greatly improved since starting topiramate with approx 2-3/month -Continue topiramate 75 mg nightly -refill provided -start sumatriptan 100 mg daily for abortive therapy - advised he can repeat x1 after 2 hours if needed. Max dose 200 mg per 24 hours.  Discussed potential side effects and to call with any concerns or if medication not beneficial   Follow-up in 6 months or call earlier if needed    CC:  Willaim Sheng, MD   I spent 21 minutes of face-to-face and non-face-to-face time with patient.  This included previsit chart review, lab review, study review, order entry, electronic health record documentation, patient education and discussion regarding migraine headaches, continued use of topiramate for prophylactic therapy and starting sumatriptan for abortive therapy, potential side effects, and answered all other questions to patient satisfaction  Ihor Austin, AGNP-BC  [facility] [address] [place], [place] [address]  Phone [phone] Fax [phone] Note: This document was prepared with digital dictation and possible smart phrase technology. Any transcriptional errors that result from this process are unintentional.

## 2021-02-02 ENCOUNTER — Other Ambulatory Visit: Payer: Self-pay

## 2021-02-02 ENCOUNTER — Ambulatory Visit: Payer: 59

## 2021-02-02 ENCOUNTER — Ambulatory Visit
Admission: RE | Admit: 2021-02-02 | Discharge: 2021-02-02 | Disposition: A | Discharge status: Home / Self Care | Payer: 59 | Source: Ambulatory Visit | Attending: Family Medicine | Admitting: Family Medicine

## 2021-02-02 DIAGNOSIS — R911 Solitary pulmonary nodule: Secondary | ICD-10-CM

## 2021-02-02 IMAGING — CT CT CHEST LCS NODULE FOLLOW-UP W/O CM
1 of 2 series · 15 of 40 positions shown, 19 images · non-contrast
Comparison: CT lung graft Sir screening dated [DATE]

CLINICAL DATA: Current smoker with 31 pack-year history

EXAM:
CT CHEST WITHOUT CONTRAST FOR LUNG CANCER SCREENING NODULE FOLLOW-UP
TECHNIQUE: Multidetector CT imaging of the chest was performed following the
standard protocol without IV contrast.

[Series 2: lcs nodule f/u · axial · 0.71mm/px · z∈[+1104,+1424]mm · 15 of 70 slices shown, 19 images]
[im 3/70  mediastinal]
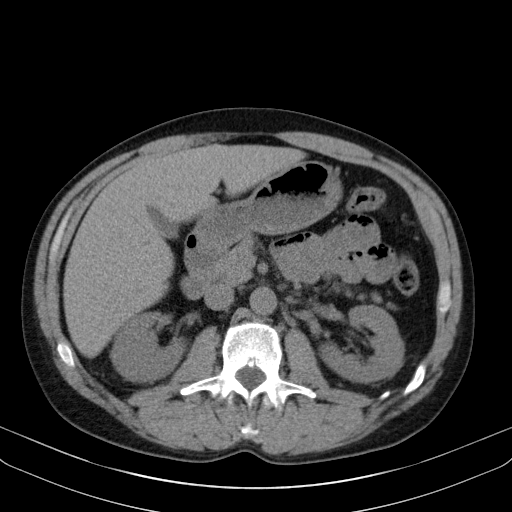
[im 3/70  lung]
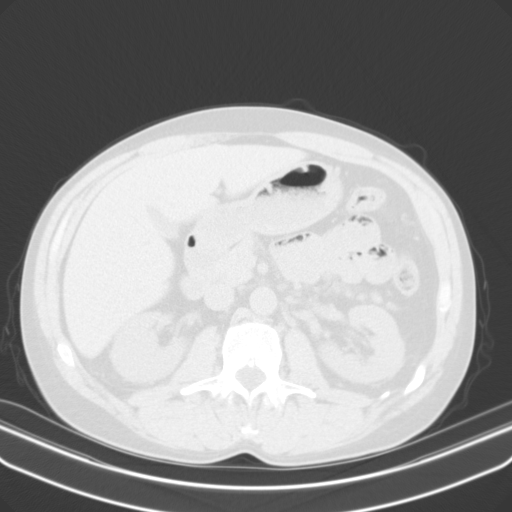
[im 8/70  lung]
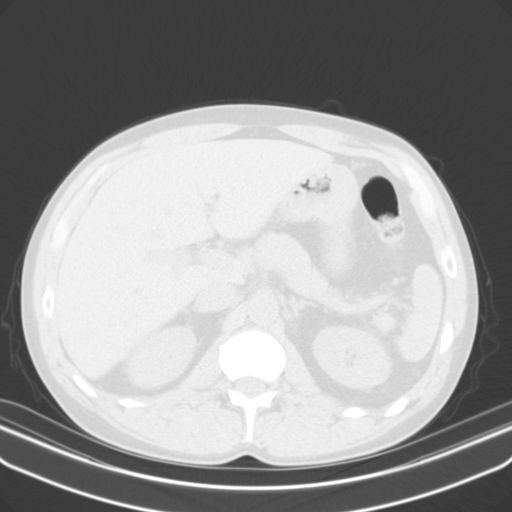
[im 14/70  lung]
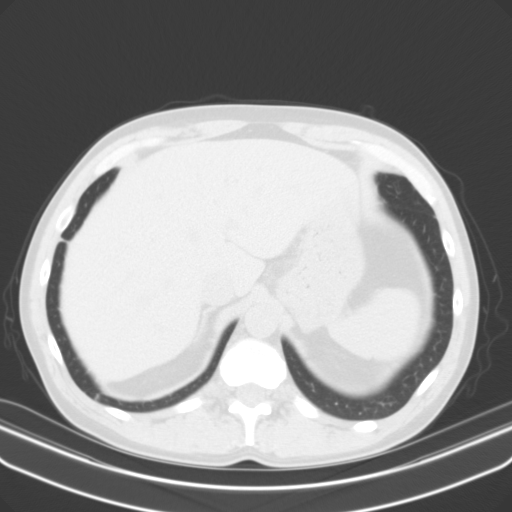
[im 18/70  lung]
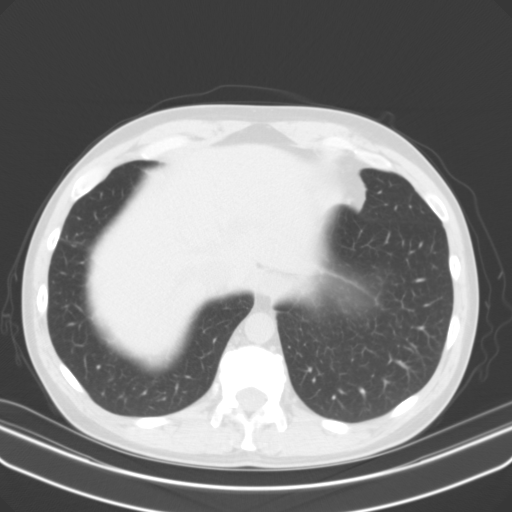
[im 22/70  mediastinal]
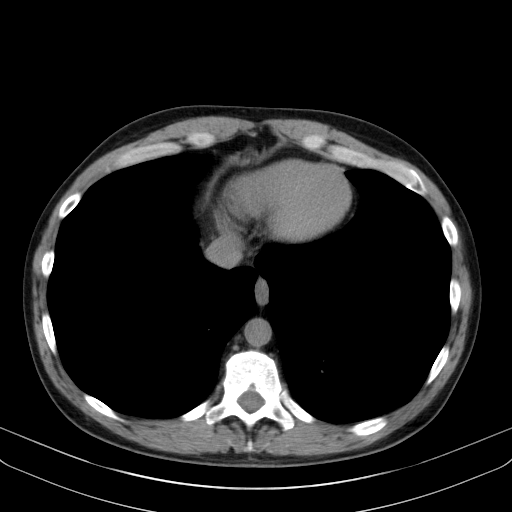
[im 22/70  lung]
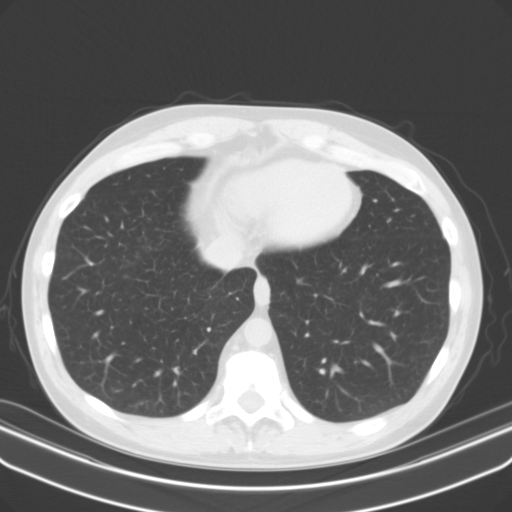
[im 27/70  lung]
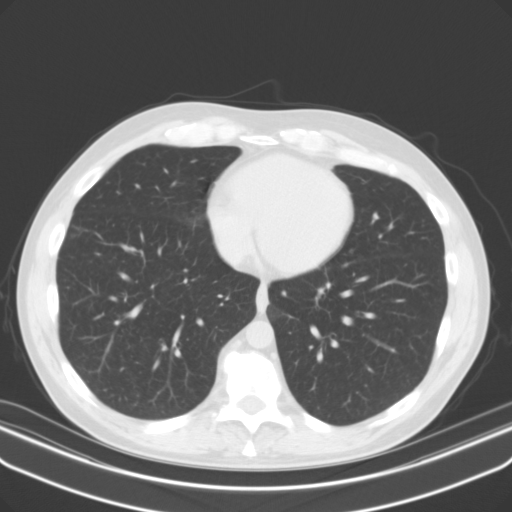
[im 32/70  lung]
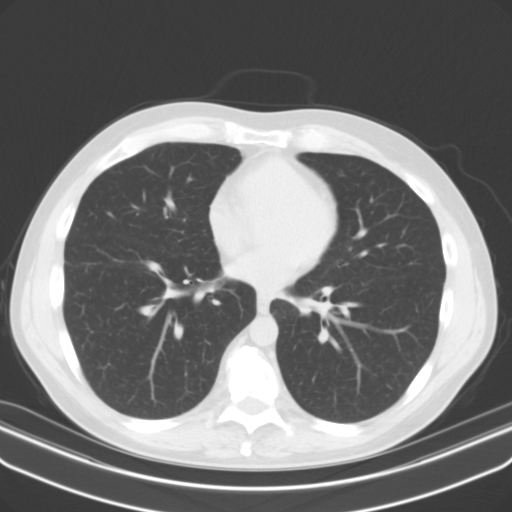
[im 35/70  lung]
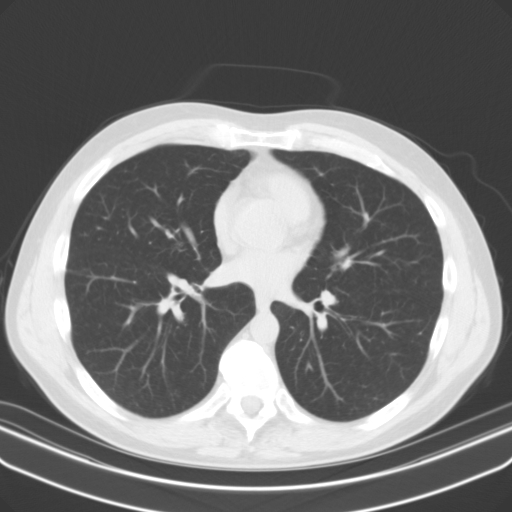
[im 38/70  mediastinal]
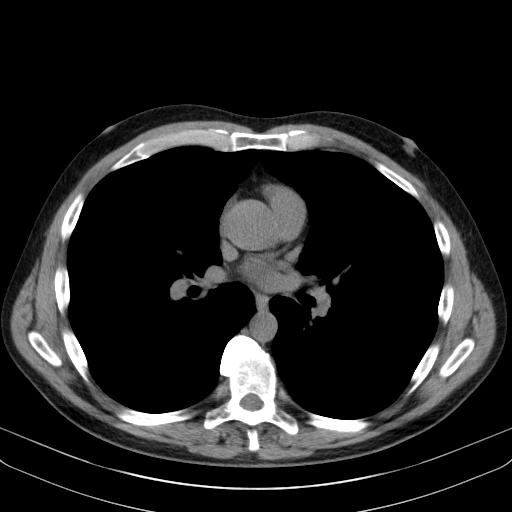
[im 38/70  lung]
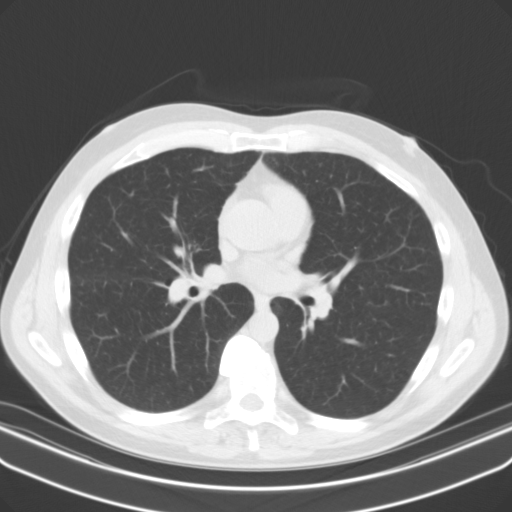
[im 43/70  lung]
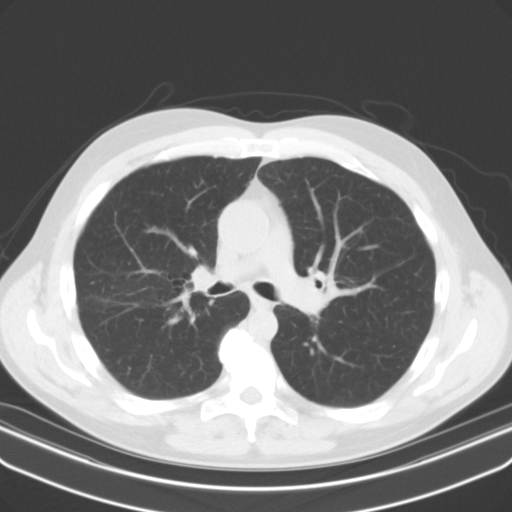
[im 48/70  lung]
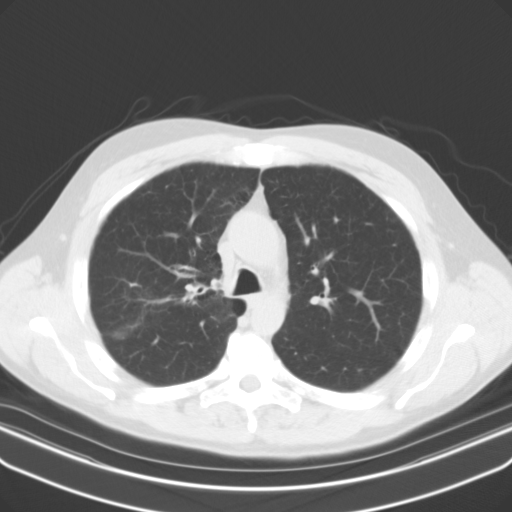
[im 52/70  lung]
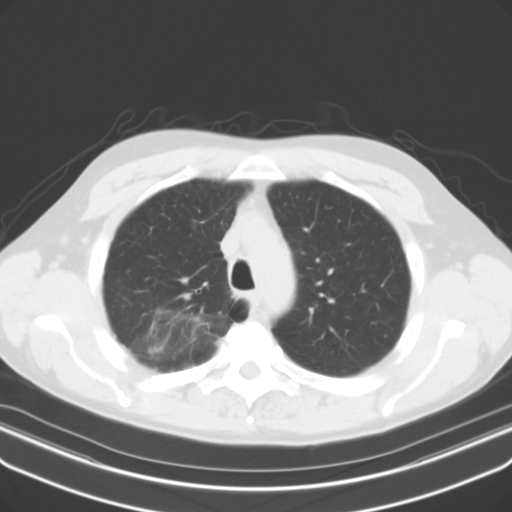
[im 56/70  mediastinal]
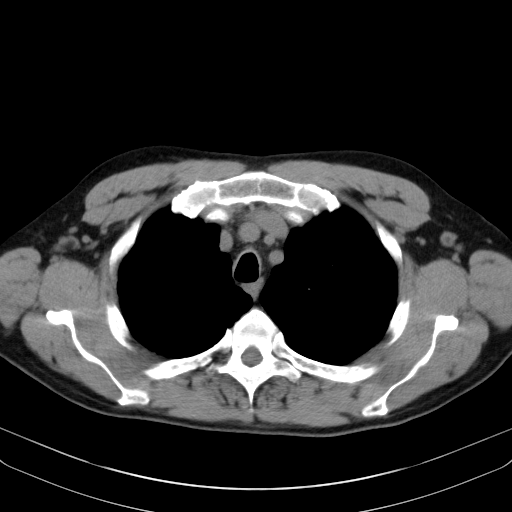
[im 56/70  lung]
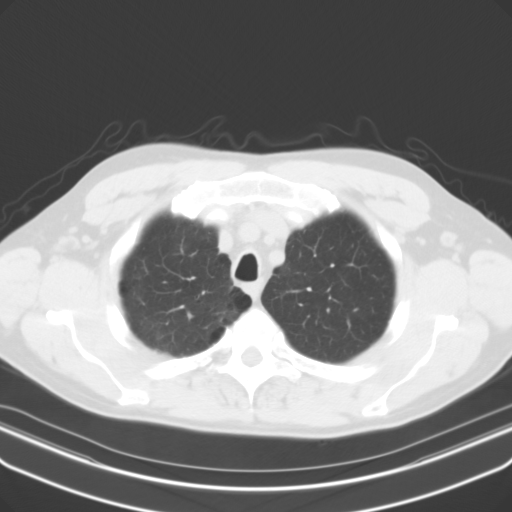
[im 62/70  lung]
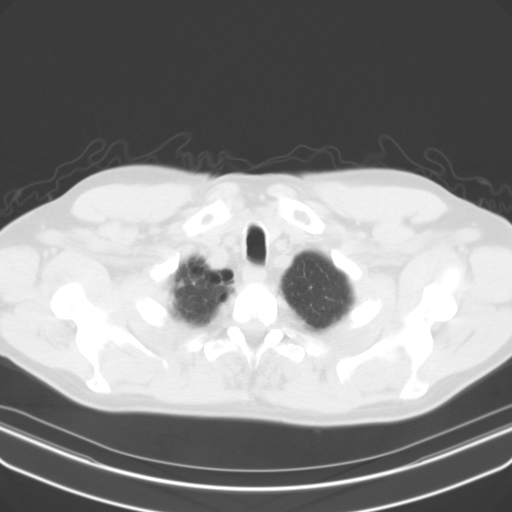
[im 67/70  lung]
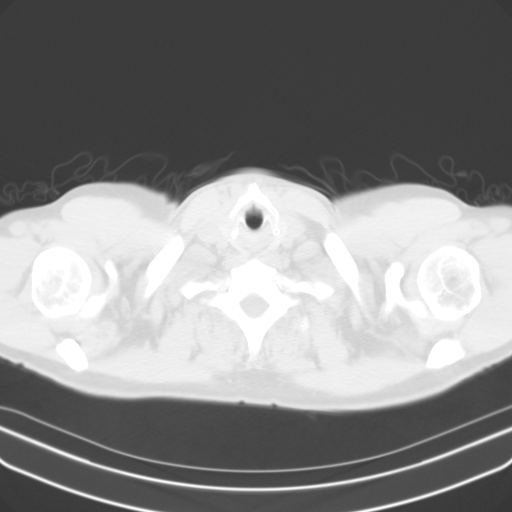

[15 of 40 positions shown; findings below may reference images not displayed]

FINDINGS: Cardiovascular: Normal heart size. No pericardial effusion. No
significant coronary artery calcifications. No significant
atherosclerotic disease of the thoracic aorta.

Mediastinum/Nodes: Esophagus and thyroid are unremarkable. No
pathologically enlarged lymph nodes seen in the chest.

Lungs/Pleura: Central airways are patent. Mild Paraseptal and
centrilobular emphysema. Linear opacity of the right upper lobe with
associated small solid nodules located on image 86, unchanged
compared to prior exam. Additional small solid pulmonary nodules are
also stable. No consolidation, pleural effusion pneumothorax.

Upper Abdomen: No acute abnormality.

Musculoskeletal: No chest wall mass or suspicious bone lesions
identified.
IMPRESSION: 1. Linear opacity of the right upper lobe with associated small
nodules is unchanged compared to prior exam and likely an area of
post infectious scarring. Lung-RADS 2, benign appearance or
behavior. Recommend return to annual lung cancer screening.
2. Aortic Atherosclerosis ([TZ]-[TZ]) and Emphysema ([TZ]-[TZ]).

## 2021-04-05 DIAGNOSIS — S8992XA Unspecified injury of left lower leg, initial encounter: Secondary | ICD-10-CM | POA: Diagnosis not present

## 2021-04-05 DIAGNOSIS — W208XXA Other cause of strike by thrown, projected or falling object, initial encounter: Secondary | ICD-10-CM | POA: Diagnosis not present

## 2021-04-07 DIAGNOSIS — M25572 Pain in left ankle and joints of left foot: Secondary | ICD-10-CM | POA: Diagnosis not present

## 2021-04-07 DIAGNOSIS — S8992XA Unspecified injury of left lower leg, initial encounter: Secondary | ICD-10-CM | POA: Diagnosis not present

## 2021-04-25 DIAGNOSIS — Z720 Tobacco use: Secondary | ICD-10-CM | POA: Diagnosis not present

## 2021-04-25 DIAGNOSIS — R69 Illness, unspecified: Secondary | ICD-10-CM | POA: Diagnosis not present

## 2021-04-25 DIAGNOSIS — Z789 Other specified health status: Secondary | ICD-10-CM | POA: Diagnosis not present

## 2021-05-09 ENCOUNTER — Encounter: Payer: Self-pay | Admitting: Adult Health

## 2021-06-12 DIAGNOSIS — K648 Other hemorrhoids: Secondary | ICD-10-CM | POA: Diagnosis not present

## 2021-06-12 DIAGNOSIS — D122 Benign neoplasm of ascending colon: Secondary | ICD-10-CM | POA: Diagnosis not present

## 2021-06-12 DIAGNOSIS — Z1211 Encounter for screening for malignant neoplasm of colon: Secondary | ICD-10-CM | POA: Diagnosis not present

## 2021-06-15 DIAGNOSIS — D122 Benign neoplasm of ascending colon: Secondary | ICD-10-CM | POA: Diagnosis not present

## 2021-07-18 ENCOUNTER — Ambulatory Visit: Payer: BLUE CROSS/BLUE SHIELD | Admitting: Adult Health

## 2021-08-01 ENCOUNTER — Ambulatory Visit: Payer: 59 | Admitting: Adult Health

## 2021-08-01 ENCOUNTER — Encounter: Payer: Self-pay | Admitting: Adult Health

## 2021-08-01 VITALS — BP 109/71 | HR 66 | Ht 68.5 in | Wt 147.0 lb

## 2021-08-01 DIAGNOSIS — G43409 Hemiplegic migraine, not intractable, without status migrainosus: Secondary | ICD-10-CM | POA: Diagnosis not present

## 2021-08-01 NOTE — Progress Notes (Signed)
Reason for visit: Headache, visual disturbance   Chief Complaint  Patient presents with   Follow-up    Rm 3 alone here for 6 month f/u for migraines. Pt report he has been doing ok. He has been taking a friends rx for valium (10 mg) at night and this has been helping sleeping and improvements with this h/a.      Roberto Knapp is a 51 y.o. male  History of present illness:  Update 08/01/2021 JM: Patient returns for 57-month migraine follow-up.  Doing well since prior visit.  Remains on topiramate 75 mg nightly.  Currently experiencing about 3-4 migraines per month, can last 10-15 minutes.  Use of sumatriptan with benefit. If he is unable to take at onset, will have prolonged headache. He will have difficulty at times staying asleep or not feeling rested in the morning, he has tried a friends Valium with improvement of the symptoms.  He was recently diagnosed with sleep apnea and has been using his CPAP machine nightly over the past 3-4 months, this is managed by his PCP.  No further concerns at this time.     History provided for reference purposes only Update 01/17/2021 JM: Returns for follow-up visit after prior visit with Dr. Jannifer Franklin 5 months ago.  Started Topamax with current dose 75 mg nightly for likely migraine headaches at prior visit.  Reports improvement of migraine headaches currently experiencing 2 to 1-month with decreased intensity.  Typically last 30 to 40 minutes but will need to find a quite place to relax until migraine subsides.  Occasionally accompanied by mild dizziness but no reoccurring visual changes, weakness or speech changes associated with migraine headaches.  No further concerns at this time.  Consult visit 08/10/2020 Dr. Jannifer Franklin: Mr. Krause is a 51 year old right-handed white male with a history of tobacco abuse and ADD on Vyvanse.  The patient went to the emergency room on 25 Jul 2020 with onset of dizziness.  He claims that he woke up that morning, and sat up and  began to feel somewhat lightheaded, with some degree of a spinning sensation or vertigo.  He initially did not have a headache.  He felt somewhat nauseated and felt fainty.  He also noted black spots in front of the eye on the right.  He decided he was going to try to make it to work and by the time he got to work he was feeling worse.  The dizziness felt more significant, the spots in front of the right eye were more prominent, and he began to have a headache.  The patient also noted that he was feeling somewhat confused and was slurring his speech and he had some clumsiness or weakness of his right arm.  He called his wife to come get him, and he wound up going to the emergency room.  The patient underwent MRI of the brain which showed chronic small vessel changes that were relatively mild, but no acute changes were seen.  MRA of the head and neck were unremarkable.  The patient had symptoms for about 30 to 45 minutes with full clearing at that point.  The patient in retrospect indicates that over the last year or so he has had a total of 4 such episodes, all events are stereotypical, associated with spots in front of the eye, headache, dizziness, and right arm weakness or clumsiness.  The patient reports that he has had headaches off and on throughout his entire life.  The headaches generally  occur about once a week, he will take Advil with good improvement.  He indicates that his mother and his maternal grandmother also had some headache.  The patient was told to go on low-dose aspirin in the emergency room which he is now doing.  He comes to this office for further evaluation.      Past Medical History:  Diagnosis Date   Drug overdose, intentional ([place])    Hemiplegic migraine 08/10/2020    Past Surgical History:  Procedure Laterality Date   FASCIOTOMY  09/08/2011   Procedure: FASCIOTOMY;  Surgeon: Elam Dutch, MD;  Location: [facility] OR;  Service: Vascular;  Laterality: Left;   FEMORAL-POPLITEAL  BYPASS GRAFT  09/08/2011   Procedure: BYPASS GRAFT FEMORAL-POPLITEAL ARTERY;  Surgeon: Elam Dutch, MD;  Location: [facility] OR;  Service: Vascular;  Laterality: Left;   NO PAST SURGERIES      Family History  Problem Relation Age of Onset   Hypertension Father     Social history:  reports that he has been smoking cigarettes. He has a 12.50 pack-year smoking history. He has never used smokeless tobacco. He reports current alcohol use of about 28.0 standard drinks per week. He reports that he does not use drugs.  Medications:  Prior to Admission medications   Medication Sig Start Date End Date Taking? Authorizing Provider  Acetaminophen (TYLENOL PO) Take 2 tablets by mouth every 6 (six) hours as needed (pain/fever/headache).   Yes [provider]  aspirin EC 81 MG tablet Take 1 tablet (81 mg total) by mouth daily. Swallow whole. 07/25/20  Yes Varney Biles, MD  calcium carbonate (TUMS - DOSED IN MG ELEMENTAL CALCIUM) 500 MG chewable tablet Chew 1 tablet by mouth daily.   Yes [provider]  dimenhyDRINATE (DRAMAMINE) 50 MG tablet Take 50 mg by mouth every 8 (eight) hours as needed.   Yes [provider]  ibuprofen (ADVIL) 600 MG tablet Take 1 tablet (600 mg total) by mouth every 6 (six) hours as needed. 08/10/19  Yes Isla Pence, MD  loratadine (CLARITIN) 10 MG tablet Take 10 mg by mouth daily as needed for allergies.   Yes [provider]  meclizine (ANTIVERT) 50 MG tablet Take 1 tablet (50 mg total) by mouth 3 (three) times daily as needed. 07/25/20  Yes Nanavati, Ankit, MD  VYVANSE 70 MG capsule Take 70 mg by mouth every morning. 07/06/20  Yes [provider]  amoxicillin-clavulanate (AUGMENTIN) 875-125 MG tablet Take 1 tablet by mouth every 12 (twelve) hours. 08/10/19   Isla Pence, MD  HYDROcodone-acetaminophen (NORCO/VICODIN) 5-325 MG tablet Take 1 tablet by mouth every 4 (four) hours as needed. 08/10/19   Isla Pence, MD  pantoprazole  (PROTONIX) 40 MG tablet Take 1 tablet (40 mg total) by mouth daily. 03/07/18   Lavina Hamman, MD     No Known Allergies  ROS:  Out of a complete 14 system review of symptoms, the patient complains only of the following symptoms, and all other reviewed systems are negative.  Headache Dizziness   Blood pressure 109/71, pulse 66, height 5' 8.5" (1.74 m), weight 147 lb (66.7 kg).  Physical Exam  General: well developed, well nourished, very pleasant middle-age Caucasian male, seated, in no evident distress Head: head normocephalic and atraumatic.   Neck: supple with no carotid or supraclavicular bruits Cardiovascular: regular rate and rhythm, no murmurs Musculoskeletal: no deformity Skin:  no rash/petichiae Vascular:  Normal pulses all extremities   Neurologic Exam Mental Status: Awake and fully  alert. Oriented to place and time. Recent and remote memory intact. Attention span, concentration and fund of knowledge appropriate. Mood and affect appropriate.  Cranial Nerves: Pupils equal, briskly reactive to light. Extraocular movements full without nystagmus. Visual fields full to confrontation. Hearing intact. Facial sensation intact. Face, tongue, palate moves normally and symmetrically.  Motor: Normal bulk and tone. Normal strength in all tested extremity muscles Sensory.: intact to touch , pinprick , position and vibratory sensation.  Coordination: Rapid alternating movements normal in all extremities. Finger-to-nose and heel-to-shin performed accurately bilaterally. Gait and Station: Arises from chair without difficulty. Stance is normal. Gait demonstrates normal stride length and balance without use of assistive device. Tandem walk and heel toe without difficulty.  Reflexes: 1+ and symmetric. Toes downgoing.       Pertinent imaging:  MRI brain 07/25/20: IMPRESSION: No acute infarction, hemorrhage, or mass. Probable mild chronic microvascular ischemic changes.   No large  vessel occlusion, hemodynamically significant stenosis, or evidence of dissection.   MRA head and neck 07/25/20: IMPRESSION: No acute infarction, hemorrhage, or mass. Probable mild chronic microvascular ischemic changes.   No large vessel occlusion, hemodynamically significant stenosis, or evidence of dissection.  MRA HEAD   Intracranial internal carotid arteries are patent. Middle and anterior cerebral arteries are patent. Intracranial vertebral arteries, basilar artery, posterior cerebral arteries are patent. Bilateral posterior communicating arteries are present. There is no significant stenosis or aneurysm.     Assessment/Plan:  1.  Episode of dizziness, visual disturbance, headache, and right arm weakness, probable neurologic migraine  -Currently 3-4 migraines per month -Continue topiramate 75 mg nightly -refill up-to-date.  Discussed increasing dosage but patient wishes to remain on current dose at this time.  He was advised to call if he wishes to increase in the future -Continue sumatriptan 100 mg daily as needed for abortive therapy - advised he can repeat x1 after 2 hours if needed. Max dose 200 mg per 24 hours.  -Advised to discuss sleeping concerns with PCP but hopefully this will improve as he continues to CPAP. Advised use of CPAP can also help with his headaches.  Discouraged use of prescription medications not specifically prescribed to him and to further discuss medications with PCP    Follow-up in 6 months or call earlier if needed    CC:  Calvert Cantor, MD   I spent 23 minutes of face-to-face and non-face-to-face time with patient.  This included previsit chart review, lab review, study review, electronic health record documentation, patient education and discussion regarding migraine headaches, continued use of topiramate for prophylactic therapy and sumatriptan for abortive therapy, sleeping concerns, and answered all other questions to patient  satisfaction  Frann Rider, AGNP-BC  [facility] [address] [place], [place] [address]  Phone [phone] Fax [phone] Note: This document was prepared with digital dictation and possible smart phrase technology. Any transcriptional errors that result from this process are unintentional.

## 2021-08-01 NOTE — Patient Instructions (Signed)
Continue topamax 75mg  nightly for now - if you wish to increase this in the future, please let me know  Continue nightly use of CPAP for sleep apnea management     Follow up in 6 months or call earlier if needed

## 2021-09-21 DIAGNOSIS — R69 Illness, unspecified: Secondary | ICD-10-CM | POA: Diagnosis not present

## 2021-09-21 DIAGNOSIS — Z789 Other specified health status: Secondary | ICD-10-CM | POA: Diagnosis not present

## 2021-09-21 DIAGNOSIS — Z0000 Encounter for general adult medical examination without abnormal findings: Secondary | ICD-10-CM | POA: Diagnosis not present

## 2021-09-21 DIAGNOSIS — E785 Hyperlipidemia, unspecified: Secondary | ICD-10-CM | POA: Diagnosis not present

## 2021-09-21 DIAGNOSIS — Z23 Encounter for immunization: Secondary | ICD-10-CM | POA: Diagnosis not present

## 2021-09-21 DIAGNOSIS — M25511 Pain in right shoulder: Secondary | ICD-10-CM | POA: Diagnosis not present

## 2021-09-21 DIAGNOSIS — Z9189 Other specified personal risk factors, not elsewhere classified: Secondary | ICD-10-CM | POA: Diagnosis not present

## 2021-09-21 DIAGNOSIS — Z720 Tobacco use: Secondary | ICD-10-CM | POA: Diagnosis not present

## 2021-10-17 ENCOUNTER — Encounter: Payer: Self-pay | Admitting: *Deleted

## 2021-10-20 DIAGNOSIS — R69 Illness, unspecified: Secondary | ICD-10-CM | POA: Diagnosis not present

## 2022-01-04 ENCOUNTER — Ambulatory Visit: Payer: Self-pay

## 2022-01-04 NOTE — Patient Outreach (Signed)
  Care Coordination   01/04/2022 Name: Roberto Knapp MRN: [id number] DOB: 1970-06-21   Care Coordination Outreach Attempts:  An unsuccessful telephone outreach was attempted today to offer the patient information about available care coordination services as a benefit of their health plan.   Follow Up Plan:  Additional outreach attempts will be made to offer the patient care coordination information and services.   Encounter Outcome:  No Answer  Care Coordination Interventions Activated:  No   Care Coordination Interventions:  No, not indicated    Daneen Schick, BSW, CDP Social Worker, Certified Dementia Practitioner [facility]  Care Coordination [phone]

## 2022-01-08 ENCOUNTER — Ambulatory Visit: Payer: Self-pay

## 2022-01-08 NOTE — Patient Outreach (Signed)
  Care Coordination   01/08/2022 Name: [name] MRN: [id number] DOB: 1970-03-30   Care Coordination Outreach Attempts:  A second unsuccessful outreach was attempted today to offer the patient with information about available care coordination services as a benefit of their health plan.     Follow Up Plan:  Additional outreach attempts will be made to offer the patient care coordination information and services.   Encounter Outcome:  No Answer  Care Coordination Interventions Activated:  No   Care Coordination Interventions:  No, not indicated    Daneen Schick, BSW, CDP Social Worker, Certified Dementia Practitioner [facility] Care Management  Care Coordination [phone]

## 2022-02-01 NOTE — Progress Notes (Signed)
Reason for visit: Headache, visual disturbance   Chief Complaint  Patient presents with   Follow-up    RM 2 alone Pt is well and stable, no new concerns      Roberto Knapp is a 51 y.o. male   History of present illness:  Update 02/05/2022 JM: Patient returns for 64-month migraine follow-up unaccompanied.  Remains on topiramate 75 mg nightly.  Tolerating well.  Currently experiencing about 2 migraine days per month.  Continued use of sumatriptan with benefit without side effects.  No questions or concerns today.    History provided for reference purposes only Update 08/01/2021 JM: Patient returns for 68-month migraine follow-up.  Doing well since prior visit.  Remains on topiramate 75 mg nightly.  Currently experiencing about 3-4 migraines per month, can last 10-15 minutes.  Use of sumatriptan with benefit. If he is unable to take at onset, will have prolonged headache. He will have difficulty at times staying asleep or not feeling rested in the morning, he has tried a friends Valium with improvement of the symptoms.  He was recently diagnosed with sleep apnea and has been using his CPAP machine nightly over the past 3-4 months, this is managed by his PCP.  No further concerns at this time.  Update 01/17/2021 JM: Returns for follow-up visit after prior visit with Dr. Anne Hahn 5 months ago.  Started Topamax with current dose 75 mg nightly for likely migraine headaches at prior visit.  Reports improvement of migraine headaches currently experiencing 2 to 42-month with decreased intensity.  Typically last 30 to 40 minutes but will need to find a quite place to relax until migraine subsides.  Occasionally accompanied by mild dizziness but no reoccurring visual changes, weakness or speech changes associated with migraine headaches.  No further concerns at this time.  Consult visit 08/10/2020 Dr. Anne Hahn: Roberto Knapp is a 51 year old right-handed white male with a history of tobacco abuse and ADD on  Vyvanse.  The patient went to the emergency room on 25 Jul 2020 with onset of dizziness.  He claims that he woke up that morning, and sat up and began to feel somewhat lightheaded, with some degree of a spinning sensation or vertigo.  He initially did not have a headache.  He felt somewhat nauseated and felt fainty.  He also noted black spots in front of the eye on the right.  He decided he was going to try to make it to work and by the time he got to work he was feeling worse.  The dizziness felt more significant, the spots in front of the right eye were more prominent, and he began to have a headache.  The patient also noted that he was feeling somewhat confused and was slurring his speech and he had some clumsiness or weakness of his right arm.  He called his wife to come get him, and he wound up going to the emergency room.  The patient underwent MRI of the brain which showed chronic small vessel changes that were relatively mild, but no acute changes were seen.  MRA of the head and neck were unremarkable.  The patient had symptoms for about 30 to 45 minutes with full clearing at that point.  The patient in retrospect indicates that over the last year or so he has had a total of 4 such episodes, all events are stereotypical, associated with spots in front of the eye, headache, dizziness, and right arm weakness or clumsiness.  The patient reports  that he has had headaches off and on throughout his entire life.  The headaches generally occur about once a week, he will take Advil with good improvement.  He indicates that his mother and his maternal grandmother also had some headache.  The patient was told to go on low-dose aspirin in the emergency room which he is now doing.  He comes to this office for further evaluation.      Past Medical History:  Diagnosis Date   Drug overdose, intentional ([facility])    Hemiplegic migraine 08/10/2020    Past Surgical History:  Procedure Laterality Date   FASCIOTOMY   09/08/2011   Procedure: FASCIOTOMY;  Surgeon: Sherren Kerns, MD;  Location: [facility] OR;  Service: Vascular;  Laterality: Left;   FEMORAL-POPLITEAL BYPASS GRAFT  09/08/2011   Procedure: BYPASS GRAFT FEMORAL-POPLITEAL ARTERY;  Surgeon: Sherren Kerns, MD;  Location: [facility] OR;  Service: Vascular;  Laterality: Left;   NO PAST SURGERIES      Family History  Problem Relation Age of Onset   Hypertension Father     Social history:  reports that he has been smoking cigarettes. He has a 12.50 pack-year smoking history. He has never used smokeless tobacco. He reports current alcohol use of about 28.0 standard drinks of alcohol per week. He reports that he does not use drugs.  Medications:  Prior to Admission medications   Medication Sig Start Date End Date Taking? Authorizing Provider  Acetaminophen (TYLENOL PO) Take 2 tablets by mouth every 6 (six) hours as needed (pain/fever/headache).   Yes [provider]  aspirin EC 81 MG tablet Take 1 tablet (81 mg total) by mouth daily. Swallow whole. 07/25/20  Yes Derwood Kaplan, MD  calcium carbonate (TUMS - DOSED IN MG ELEMENTAL CALCIUM) 500 MG chewable tablet Chew 1 tablet by mouth daily.   Yes [provider]  dimenhyDRINATE (DRAMAMINE) 50 MG tablet Take 50 mg by mouth every 8 (eight) hours as needed.   Yes [provider]  ibuprofen (ADVIL) 600 MG tablet Take 1 tablet (600 mg total) by mouth every 6 (six) hours as needed. 08/10/19  Yes Jacalyn Lefevre, MD  loratadine (CLARITIN) 10 MG tablet Take 10 mg by mouth daily as needed for allergies.   Yes [provider]  meclizine (ANTIVERT) 50 MG tablet Take 1 tablet (50 mg total) by mouth 3 (three) times daily as needed. 07/25/20  Yes Nanavati, Ankit, MD  VYVANSE 70 MG capsule Take 70 mg by mouth every morning. 07/06/20  Yes [provider]  amoxicillin-clavulanate (AUGMENTIN) 875-125 MG tablet Take 1 tablet by mouth every 12 (twelve) hours. 08/10/19   Jacalyn Lefevre, MD   HYDROcodone-acetaminophen (NORCO/VICODIN) 5-325 MG tablet Take 1 tablet by mouth every 4 (four) hours as needed. 08/10/19   Jacalyn Lefevre, MD  pantoprazole (PROTONIX) 40 MG tablet Take 1 tablet (40 mg total) by mouth daily. 03/07/18   Rolly Salter, MD     No Known Allergies  ROS:  Out of a complete 14 system review of symptoms, the patient complains only of the following symptoms as noted in HPI, and all other reviewed systems are negative.     Blood pressure 106/66, pulse 70, height 5\' 9"  (1.753 m), weight 143 lb (64.9 kg).  Physical Exam  General: well developed, well nourished, very pleasant middle-age Caucasian male, seated, in no evident distress Head: head normocephalic and atraumatic.   Neck: supple with no carotid or supraclavicular bruits Cardiovascular: regular rate and rhythm, no murmurs Musculoskeletal:  no deformity Skin:  no rash/petichiae Vascular:  Normal pulses all extremities   Neurologic Exam Mental Status: Awake and fully alert. Oriented to place and time. Recent and remote memory intact. Attention span, concentration and fund of knowledge appropriate. Mood and affect appropriate.  Cranial Nerves: Pupils equal, briskly reactive to light. Extraocular movements full without nystagmus. Visual fields full to confrontation. Hearing intact. Facial sensation intact. Face, tongue, palate moves normally and symmetrically.  Motor: Normal bulk and tone. Normal strength in all tested extremity muscles Sensory.: intact to touch , pinprick , position and vibratory sensation.  Coordination: Rapid alternating movements normal in all extremities. Finger-to-nose and heel-to-shin performed accurately bilaterally. Gait and Station: Arises from chair without difficulty. Stance is normal. Gait demonstrates normal stride length and balance without use of assistive device. Tandem walk and heel toe without difficulty.  Reflexes: 1+ and symmetric. Toes downgoing.       Pertinent  imaging:  MRI brain 07/25/20: IMPRESSION: No acute infarction, hemorrhage, or mass. Probable mild chronic microvascular ischemic changes.   No large vessel occlusion, hemodynamically significant stenosis, or evidence of dissection.   MRA head and neck 07/25/20: IMPRESSION: No acute infarction, hemorrhage, or mass. Probable mild chronic microvascular ischemic changes.   No large vessel occlusion, hemodynamically significant stenosis, or evidence of dissection.  MRA HEAD   Intracranial internal carotid arteries are patent. Middle and anterior cerebral arteries are patent. Intracranial vertebral arteries, basilar artery, posterior cerebral arteries are patent. Bilateral posterior communicating arteries are present. There is no significant stenosis or aneurysm.     Assessment/Plan:  1.  Episode of dizziness, visual disturbance, headache, and right arm weakness, probable neurologic migraine  -Currently 2 migraines per month -Continue topiramate 75 mg nightly -refill provided.  -Continue sumatriptan 100 mg daily as needed for abortive therapy - advised he can repeat x1 after 2 hours if needed. Max dose 200 mg per 24 hours.     Follow-up in 1 year or call earlier if needed    CC:  Willaim Sheng, MD   I spent 21 minutes of face-to-face and non-face-to-face time with patient.  This included previsit chart review, lab review, study review, electronic health record documentation, patient education and discussion regarding migraine headaches, continued use of topiramate for prophylactic therapy and sumatriptan for abortive therapy, and answered all other questions to patient satisfaction  Ihor Austin, AGNP-BC  [facility] [address] [place], [place] [address]  Phone [phone] Fax [phone] Note: This document was prepared with digital dictation and possible smart phrase technology. Any transcriptional errors that result from  this process are unintentional.

## 2022-02-05 ENCOUNTER — Encounter: Payer: Self-pay | Admitting: Adult Health

## 2022-02-05 ENCOUNTER — Ambulatory Visit (INDEPENDENT_AMBULATORY_CARE_PROVIDER_SITE_OTHER): Payer: 59 | Admitting: Adult Health

## 2022-02-05 VITALS — BP 106/66 | HR 70 | Ht 69.0 in | Wt 143.0 lb

## 2022-02-05 DIAGNOSIS — G43409 Hemiplegic migraine, not intractable, without status migrainosus: Secondary | ICD-10-CM | POA: Diagnosis not present

## 2022-02-05 MED ORDER — TOPIRAMATE 25 MG PO TABS: 75.0000 mg | ORAL_TABLET | Freq: Every day | ORAL | 3 refills | Status: DC

## 2022-02-05 MED ORDER — SUMATRIPTAN SUCCINATE 100 MG PO TABS: 100.0000 mg | ORAL_TABLET | Freq: Once | ORAL | 11 refills | Status: DC | PRN

## 2022-02-05 NOTE — Patient Instructions (Signed)
It was good to see you again today!   No changes at this time, please continue topiramate 75 mg nightly and uses sumatriptan as needed    Follow-up in 1 year or call earlier if needed

## 2022-02-06 ENCOUNTER — Ambulatory Visit: Payer: Self-pay

## 2022-02-06 NOTE — Patient Outreach (Signed)
  Care Coordination   02/06/2022 Name: Roberto Knapp MRN: [id number] DOB: [birth year]   Care Coordination Outreach Attempts:  A third unsuccessful outreach was attempted today to offer the patient with information about available care coordination services as a benefit of their health plan.   Follow Up Plan:  No further outreach attempts will be made at this time. We have been unable to contact the patient to offer or enroll patient in care coordination services  Encounter Outcome:  No Answer   Care Coordination Interventions:  No, not indicated    Bevelyn Ngo, BSW, CDP Social Worker, Certified Dementia Practitioner [facility]  Care Coordination [phone]

## 2022-02-15 NOTE — Progress Notes (Deleted)
Cardiology Office Note:    Date:  02/15/2022   ID:  Roberto Knapp, Roberto Knapp Sep 11, 1970, MRN [id number]  PCP:  Roberto Sheng, MD   [facility] Providers Cardiologist:  None { Click to update primary MD,subspecialty MD or APP then REFRESH:1}    Referring MD: Roberto Lund, PA   CC: *** Consulted for the evaluation of cardiac prevention at the behest of Ms. Roberto Knapp   History of Present Illness:    Roberto Knapp is a 51 y.o. male with a hx of aortic atherosclerosis, former polysubstance abuse (cocaine, meth, tobacco) who presents for evaluation. Prior HFrEF in 2019  Patient notes that he is feeling ***.   Was last feeling well ***. Able to ***  Has had no chest pain, chest pressure, chest tightness, chest stinging ***.  Discomfort occurs with ***, worsens with ***, and improves with ***.    Patient exertion notable for *** with *** and feels no symptoms.    No shortness of breath, DOE ***.  No PND or orthopnea***.  No weight gain***, leg swelling ***, or abdominal swelling***.  No syncope or near syncope ***. Notes *** no palpitations or funny heart beats.     Patient reports prior cardiac testing including ***  No history of ***pre-eclampsia, gestation HTN or gestational DM.  No Fen-Phen or drug use***.  Ambulatory BP ***.   Past Medical History:  Diagnosis Date   Drug overdose, intentional (HCC)    Hemiplegic migraine 08/10/2020    Past Surgical History:  Procedure Laterality Date   FASCIOTOMY  09/08/2011   Procedure: FASCIOTOMY;  Surgeon: Roberto Kerns, MD;  Location: [facility] OR;  Service: Vascular;  Laterality: Left;   FEMORAL-POPLITEAL BYPASS GRAFT  09/08/2011   Procedure: BYPASS GRAFT FEMORAL-POPLITEAL ARTERY;  Surgeon: Roberto Kerns, MD;  Location: [facility] OR;  Service: Vascular;  Laterality: Left;   NO PAST SURGERIES      Current Medications: No outpatient medications have been marked as taking for the 02/16/22 encounter (Appointment) with Christell Constant, MD.     Allergies:   Patient has no known allergies.   Social History   Socioeconomic History   Marital status: Married    Spouse name: Roberto Knapp   Number of children: Not on file   Years of education: Not on file   Highest education level: Not on file  Occupational History   Occupation: full time  Tobacco Use   Smoking status: Every Day    Packs/day: 0.50    Years: 25.00    Total pack years: 12.50    Types: Cigarettes   Smokeless tobacco: Never   Tobacco comments:    pt states that he is trying to quit and has cut back  Vaping Use   Vaping Use: Never used  Substance and Sexual Activity   Alcohol use: Yes    Alcohol/week: 28.0 standard drinks of alcohol    Types: 28 Cans of beer per week   Drug use: No   Sexual activity: Not on file  Other Topics Concern   Not on file  Social History Narrative   Lives with wife   Right handed   Drinks > 10 cups caffeine daily       Social Determinants of Health   Financial Resource Strain: Not on file  Food Insecurity: Not on file  Transportation Needs: Not on file  Physical Activity: Not on file  Stress: Not on file  Social Connections: Not on file  Family History: The patient's ***family history includes Hypertension in his father.  ROS:   Please see the history of present illness.    *** All other systems reviewed and are negative.  EKGs/Labs/Other Studies Reviewed:    The following studies were reviewed today: ***  EKG:  EKG is *** ordered today.  The ekg ordered today demonstrates ***  Cardiac Studies & Procedures       ECHOCARDIOGRAM  ECHOCARDIOGRAM COMPLETE 02/11/2018  Narrative *[place]* *[facility]* [address] [place], [place] [address] [phone]  ------------------------------------------------------------------- Transthoracic Echocardiography  Patient:    Roberto Knapp, Roberto Knapp MR #:       [id number] Study Date: 02/11/2018 Gender:     M Age:        47 Height:     172.7  cm Weight:     83.1 kg BSA:        2.01 m^2 Pt. Status: Room:       2M12C  Roberto Knapp, Roberto Knapp REFERRING    Roberto Knapp PERFORMING   [facility], Inpatient SONOGRAPHER  Roberto Knapp ADMITTING    Roberto Knapp, Roberto Knapp ATTENDING    Roberto Knapp  cc:  ------------------------------------------------------------------- LV EF: 40% -   45%  ------------------------------------------------------------------- Indications:      CHF - 428.0.  ------------------------------------------------------------------- History:   Risk factors:  Diabetes mellitus.  ------------------------------------------------------------------- Study Conclusions  - Left ventricle: The cavity size was normal. Systolic function was mildly to moderately reduced. The estimated ejection fraction was in the range of 40% to 45%. Probable hypokinesis of the inferior and inferoseptal myocardium; consistent with ischemia in the distribution of the right coronary artery. Features are consistent with a pseudonormal left ventricular filling pattern, with concomitant abnormal relaxation and increased filling pressure (grade 2 diastolic dysfunction). - Pulmonary arteries: PA peak pressure: 36 mm Hg (S).  Impressions:  - Evaluation of left ventricle function and wall motion is challenging due to technical limitations. Suggest repeat imaging when patient is no longer intubated or TEE if earlier re-evaluation is indicated.  ------------------------------------------------------------------- Study data:  No prior study was available for comparison.  Study status:  Routine.  Procedure:  The patient reported no pain pre or post test. Transthoracic echocardiography. Image quality was adequate.  Study completion:  There were no complications. Transthoracic echocardiography.  M-mode, complete 2D, spectral Doppler, and color Doppler.  Birthdate:  Patient birthdate: 04-Mar-1971.  Age:  Patient is 51 yr old.  Sex:   Gender: male. BMI: 27.9 kg/m^2.  Blood pressure:     86/56  Patient status: Inpatient.  Study date:  Study date: 02/11/2018. Study time: 02:14 PM.  Location:  Bedside.  -------------------------------------------------------------------  ------------------------------------------------------------------- Left ventricle:  The cavity size was normal. Systolic function was mildly to moderately reduced. The estimated ejection fraction was in the range of 40% to 45%.  Regional wall motion abnormalities: Probable hypokinesis of the inferior and inferoseptal myocardium; consistent with ischemia in the distribution of the right coronary artery. The transmitral flow pattern was normal. The deceleration time of the early transmitral flow velocity was normal. The pulmonary vein flow pattern was normal. The tissue Doppler parameters were normal. Features are consistent with a pseudonormal left ventricular filling pattern, with concomitant abnormal relaxation and increased filling pressure (grade 2 diastolic dysfunction).  ------------------------------------------------------------------- Aortic valve:   Trileaflet; normal thickness leaflets. Mobility was not restricted.  Doppler:  Transvalvular velocity was within the normal range. There was no stenosis. There was no regurgitation.  -------------------------------------------------------------------  Aorta:  Aortic root: The aortic root was normal in size.  ------------------------------------------------------------------- Mitral valve:   Structurally normal valve.   Mobility was not restricted.  Doppler:  Transvalvular velocity was within the normal range. There was no evidence for stenosis. There was no regurgitation.    Peak gradient (D): 2 mm Hg.  ------------------------------------------------------------------- Left atrium:  The atrium was normal in size.  ------------------------------------------------------------------- Right  ventricle:  The cavity size was normal. Wall thickness was normal. Systolic function was normal.  ------------------------------------------------------------------- Pulmonic valve:   Poorly visualized.  Doppler:  Transvalvular velocity was within the normal range. There was no evidence for stenosis.  ------------------------------------------------------------------- Tricuspid valve:   Structurally normal valve.    Doppler: Transvalvular velocity was within the normal range. There was no regurgitation.  ------------------------------------------------------------------- Pulmonary artery:   The main pulmonary artery was normal-sized. Systolic pressure was within the normal range.  ------------------------------------------------------------------- Right atrium:  The atrium was normal in size.  ------------------------------------------------------------------- Pericardium:  There was no pericardial effusion.  ------------------------------------------------------------------- Systemic veins: Inferior vena cava: The vessel was dilated. The respirophasic diameter changes were blunted (< 50%), consistent with elevated central venous pressure.  ------------------------------------------------------------------- Measurements  Left ventricle                         Value        Reference LV ID, ED, PLAX chordal                49    mm     43 - 52 LV ID, ES, PLAX chordal                33    mm     23 - 38 LV fx shortening, PLAX chordal         33    %      >=29 LV PW thickness, ED                    9     mm     ---------- IVS/LV PW ratio, ED                    0.67         <=1.3 Stroke volume, 2D                      52    ml     ---------- Stroke volume/bsa, 2D                  26    ml/m^2 ---------- LV e&', lateral                         9.9   cm/s   ---------- LV E/e&', lateral                       7.84         ---------- LV e&', medial                          7.51  cm/s    ---------- LV E/e&', medial                        10.33        ---------- LV e&', average  8.71  cm/s   ---------- LV E/e&', average                       8.91         ----------  Ventricular septum                     Value        Reference IVS thickness, ED                      6     mm     ----------  LVOT                                   Value        Reference LVOT ID, S                             20    mm     ---------- LVOT area                              3.14  cm^2   ---------- LVOT peak velocity, S                  96.8  cm/s   ---------- LVOT mean velocity, S                  65.1  cm/s   ---------- LVOT VTI, S                            16.7  cm     ---------- LVOT peak gradient, S                  4     mm Hg  ----------  Aorta                                  Value        Reference Aortic root ID, ED                     32    mm     ----------  Left atrium                            Value        Reference LA ID, A-P, ES                         35    mm     ---------- LA ID/bsa, A-P                         1.74  cm/m^2 <=2.2 LA volume, S                           67.1  ml     ---------- LA volume/bsa, S  33.3  ml/m^2 ---------- LA volume, ES, 1-p A4C                 57.9  ml     ---------- LA volume/bsa, ES, 1-p A4C             28.7  ml/m^2 ---------- LA volume, ES, 1-p A2C                 65.9  ml     ---------- LA volume/bsa, ES, 1-p A2C             32.7  ml/m^2 ----------  Mitral valve                           Value        Reference Mitral E-wave peak velocity            77.6  cm/s   ---------- Mitral A-wave peak velocity            63.1  cm/s   ---------- Mitral peak gradient, D                2     mm Hg  ---------- Mitral E/A ratio, peak                 1.2          ----------  Pulmonary arteries                     Value        Reference PA pressure, S, DP             (H)     36    mm Hg  <=30  Tricuspid valve                         Value        Reference Tricuspid regurg peak velocity         265   cm/s   ---------- Tricuspid peak RV-RA gradient          28    mm Hg  ----------  Right atrium                           Value        Reference RA ID, S-I, ES, A4C                    40    mm     34 - 49 RA area, ES, A4C                       11.1  cm^2   8.3 - 19.5 RA volume, ES, A/L                     25.2  ml     ---------- RA volume/bsa, ES, A/L                 12.5  ml/m^2 ----------  Systemic veins                         Value        Reference Estimated CVP  8     mm Hg  ----------  Right ventricle                        Value        Reference TAPSE                                  19.1  mm     ---------- RV pressure, S, DP             (H)     36    mm Hg  <=30 RV s&', lateral, S                      16.3  cm/s   ----------  Legend: (L)  and  (H)  mark values outside specified reference range.  ------------------------------------------------------------------- Prepared and Electronically Authenticated by  Thurmon Fair, MD 2019-12-10T15:59:34              Recent Labs: No results found for requested labs within last 365 days.  Recent Lipid Panel    Component Value Date/Time   TRIG 722 (H) 02/13/2018 1234     Risk Assessment/Calculations:   {Does this patient have ATRIAL FIBRILLATION?:[phone]}  No BP recorded.  {Refresh Note OR Click here to enter BP  :1}***         Physical Exam:    VS:  There were no vitals taken for this visit.    Wt Readings from Last 3 Encounters:  02/05/22 143 lb (64.9 kg)  08/01/21 147 lb (66.7 kg)  01/17/21 154 lb (69.9 kg)     GEN: *** Well nourished, well developed in no acute distress HEENT: Normal NECK: No JVD; No carotid bruits LYMPHATICS: No lymphadenopathy CARDIAC: ***RRR, no murmurs, rubs, gallops RESPIRATORY:  Clear to auscultation without rales, wheezing or rhonchi  ABDOMEN: Soft, non-tender,  non-distended MUSCULOSKELETAL:  No edema; No deformity  SKIN: Warm and dry NEUROLOGIC:  Alert and oriented x 3 PSYCHIATRIC:  Normal affect   ASSESSMENT:    No diagnosis found. PLAN:     Heart Failure *** Ejection Fraction  - acute *** chronic  *** acute on chronic - NYHA class ***, Stage ***, ***volemic, etiology from *** - Diuretic regimen: Lasix/torsemide/Bumex +/- metolazone ***  - Discussed the importance of fluid restriction of < 2 L, salt restriction, and checking daily weights  - *** Replace electrolytes PRN and keep Knapp>4 and Mg>2. - *** BMP/BNP/Mg   - Coreg/bisoprolol/metoprolol***  - ARNI/ARB/ACEi*** - SGLT2i*** - aldactone/eplerenone***  - isordil/hydralazine for afterload reduction***  - on optimally titrated GDMT or with non hepatic/geriatic barriers to GDMT (Renal/hypotension issues), will consider Vericiguat 2.5-> 5-> 10 - ivabradine *** - digoxin ***  - TTE ordered *** - CMR ordered *** - Amyloid study/PYP ordered*** - Query of familial component: Family history notable for *** - CK ***  - Device Indications: *** - Clinical Trials/Barostim: *** - Indication for AHF: ***  LVEF < 30%***  - Supervised exercise therapy ***  -Transferring Sat %/Ferritin *** - HIV- *** - RF and ANA *** - SPEP ***       {Are you ordering a CV Procedure (e.g. stress test, cath, DCCV, TEE, etc)?   Press F2        :[id number]}    Medication Adjustments/Labs and Tests Ordered: Current medicines are reviewed at length with the patient today.  Concerns regarding medicines are outlined above.  No orders of the defined types were placed in this encounter.  No orders of the defined types were placed in this encounter.   There are no Patient Instructions on file for this visit.   Signed, Christell Constant, MD  02/15/2022 1:12 PM    [facility]

## 2022-02-16 ENCOUNTER — Ambulatory Visit: Payer: 59 | Admitting: Internal Medicine

## 2022-02-17 DIAGNOSIS — M5442 Lumbago with sciatica, left side: Secondary | ICD-10-CM | POA: Diagnosis not present

## 2022-02-17 DIAGNOSIS — M62830 Muscle spasm of back: Secondary | ICD-10-CM | POA: Diagnosis not present

## 2022-03-23 DIAGNOSIS — G43909 Migraine, unspecified, not intractable, without status migrainosus: Secondary | ICD-10-CM | POA: Diagnosis not present

## 2022-03-23 DIAGNOSIS — R69 Illness, unspecified: Secondary | ICD-10-CM | POA: Diagnosis not present

## 2022-03-23 DIAGNOSIS — E785 Hyperlipidemia, unspecified: Secondary | ICD-10-CM | POA: Diagnosis not present

## 2022-04-12 DIAGNOSIS — E785 Hyperlipidemia, unspecified: Secondary | ICD-10-CM | POA: Diagnosis not present

## 2022-08-09 DIAGNOSIS — M25511 Pain in right shoulder: Secondary | ICD-10-CM | POA: Diagnosis not present

## 2022-10-02 ENCOUNTER — Other Ambulatory Visit: Payer: Self-pay | Admitting: Family Medicine

## 2022-10-02 DIAGNOSIS — Z0000 Encounter for general adult medical examination without abnormal findings: Secondary | ICD-10-CM | POA: Diagnosis not present

## 2022-10-02 DIAGNOSIS — M25511 Pain in right shoulder: Secondary | ICD-10-CM | POA: Diagnosis not present

## 2022-10-02 DIAGNOSIS — E785 Hyperlipidemia, unspecified: Secondary | ICD-10-CM | POA: Diagnosis not present

## 2022-10-02 DIAGNOSIS — Z720 Tobacco use: Secondary | ICD-10-CM | POA: Diagnosis not present

## 2022-10-02 DIAGNOSIS — Z125 Encounter for screening for malignant neoplasm of prostate: Secondary | ICD-10-CM | POA: Diagnosis not present

## 2022-10-02 DIAGNOSIS — F902 Attention-deficit hyperactivity disorder, combined type: Secondary | ICD-10-CM | POA: Diagnosis not present

## 2022-10-02 DIAGNOSIS — G43909 Migraine, unspecified, not intractable, without status migrainosus: Secondary | ICD-10-CM | POA: Diagnosis not present

## 2022-10-19 DIAGNOSIS — R972 Elevated prostate specific antigen [PSA]: Secondary | ICD-10-CM | POA: Diagnosis not present

## 2022-10-23 ENCOUNTER — Encounter: Payer: Self-pay | Admitting: Family Medicine

## 2022-10-26 ENCOUNTER — Ambulatory Visit
Admission: RE | Admit: 2022-10-26 | Discharge: 2022-10-26 | Disposition: A | Discharge status: Home / Self Care | Payer: No Typology Code available for payment source | Source: Ambulatory Visit | Attending: Family Medicine | Admitting: Family Medicine

## 2022-10-26 DIAGNOSIS — Z720 Tobacco use: Secondary | ICD-10-CM

## 2022-10-26 DIAGNOSIS — F17210 Nicotine dependence, cigarettes, uncomplicated: Secondary | ICD-10-CM | POA: Diagnosis not present

## 2022-12-17 DIAGNOSIS — R972 Elevated prostate specific antigen [PSA]: Secondary | ICD-10-CM | POA: Diagnosis not present

## 2023-01-01 ENCOUNTER — Ambulatory Visit: Payer: No Typology Code available for payment source | Attending: Internal Medicine | Admitting: Internal Medicine

## 2023-01-01 VITALS — BP 105/72 | HR 78 | Ht 69.0 in | Wt 149.2 lb

## 2023-01-01 DIAGNOSIS — J80 Acute respiratory distress syndrome: Secondary | ICD-10-CM

## 2023-01-01 NOTE — Patient Instructions (Signed)
Medication Instructions:  None *If you need a refill on your cardiac medications before your next appointment, please call your pharmacy*   Lab Work: none   Testing/Procedures: None    Follow-Up: At [facility], you and your health needs are our priority.  As part of our continuing mission to provide you with exceptional heart care, we have created designated Provider Care Teams.  These Care Teams include your primary Cardiologist (physician) and Advanced Practice Providers (APPs -  Physician Assistants and Nurse Practitioners) who all work together to provide you with the care you need, when you need it.   Your next appointment:   1 year(s)  Provider:   Dr. Carolan Clines

## 2023-01-01 NOTE — Progress Notes (Signed)
  Cardiology Office Note:  .   Date:  01/01/2023  ID:  Roberto Knapp, DOB 1970-12-27, MRN [id number] PCP: Roberto Sheng, MD  [facility] Providers Cardiologist:  None    History of Present Illness: Roberto Knapp Kitchen   Roberto Knapp is a 52 y.o. male with history of ADHD, active smoker, there is a note of history of atrial fibrillation, OSA on CPAP, alcohol abuse, chronic pain,he is here as a new patient.  The referral indication only stated cardiovascular disease risk factors  In January 2020 he had ARDS and septic shock in the setting of Legionella pneumonia. He works in [employer]. During this admission he was noted to be in atrial fibrillation.  He is taking a baby aspirin.  He is also on Vyvanse. He denies palpitations. He is very active. He denies Cp or SOB. He denies family hx of coronary disease that he knows of. He smokes cigarettes. He is having trouble quitting.   ROS:  per HPI otherwise negative   Studies Reviewed: .         Risk Assessment/Calculations:    CHA2DS2-VASc Score = 0   This indicates a 0.2% annual risk of stroke. The patient's score is based upon: CHF History: 0 HTN History: 0 Diabetes History: 0 Stroke History: 0 Vascular Disease History: 0 Age Score: 0 Gender Score: 0     ASCVD 4.2%  Physical Exam:   VS:   Vitals:   01/01/23 1553  BP: 105/72  Pulse: 78  SpO2: 98%     Wt Readings from Last 3 Encounters:  01/01/23 149 lb 3.2 oz (67.7 kg)  02/05/22 143 lb (64.9 kg)  08/01/21 147 lb (66.7 kg)    GEN: Well nourished, well developed in no acute distress NECK: No JVD; No carotid bruits CARDIAC: RRR, no murmurs, rubs, gallops RESPIRATORY:  Clear to auscultation without rales, wheezing or rhonchi  ABDOMEN: Soft, non-tender, non-distended EXTREMITIES:  No edema; No deformity   ASSESSMENT AND PLAN: .   Paroxysmal Atrial Fibrillation -His CHA2DS2-VASc is 0 and there is no indication for anticoagulation.  This was in the setting of pneumonia and  shock.  He has had no recurrence.  CVD risk Tobacco Abuse -ASCVD 4.2%; this is low risk for coronary disease.  He is asymptomatic and has a very active lifestyle.  He denies family history of coronary disease as well.  We discussed signs and symptoms of coronary disease and if these were to arise we can conduct an ischemic evaluation.  We also discussed that smoking is a significant contributor to his CVD risk and he plans to taper off smoking on his own         Dispo: Follow up in 1 year  Signed, Salome Cozby, Alben Spittle, MD

## 2023-02-05 NOTE — Progress Notes (Unsigned)
Reason for visit: Headache, visual disturbance   No chief complaint on file.    Roberto Knapp is a 52 y.o. male   History of present illness:   Update 02/05/2023 Roberto Knapp: Returns for 1 year follow-up.  Doing well since prior visit.  Reports ***migraine days per month.  Remains on topiramate 75 mg nightly and sumatriptan.     History provided for reference purposes only Update 02/05/2022 Roberto Knapp: Patient returns for 60-month migraine follow-up unaccompanied.  Remains on topiramate 75 mg nightly.  Tolerating well.  Currently experiencing about 2 migraine days per month.  Continued use of sumatriptan with benefit without side effects.  No questions or concerns today.  Update 08/01/2021 Roberto Knapp: Patient returns for 52-month migraine follow-up.  Doing well since prior visit.  Remains on topiramate 75 mg nightly.  Currently experiencing about 3-4 migraines per month, can last 10-15 minutes.  Use of sumatriptan with benefit. If he is unable to take at onset, will have prolonged headache. He will have difficulty at times staying asleep or not feeling rested in the morning, he has tried a friends Valium with improvement of the symptoms.  He was recently diagnosed with sleep apnea and has been using his CPAP machine nightly over the past 3-4 months, this is managed by his PCP.  No further concerns at this time.  Update 01/17/2021 Roberto Knapp: Returns for follow-up visit after prior visit with Dr. Anne Knapp 5 months ago.  Started Topamax with current dose 75 mg nightly for likely migraine headaches at prior visit.  Reports improvement of migraine headaches currently experiencing 2 to 87-month with decreased intensity.  Typically last 30 to 40 minutes but will need to find a quite place to relax until migraine subsides.  Occasionally accompanied by mild dizziness but no reoccurring visual changes, weakness or speech changes associated with migraine headaches.  No further concerns at this time.  Consult visit 08/10/2020 Dr. Anne Knapp:  Mr. Roberto Knapp is a 53 year old right-handed white male with a history of tobacco abuse and ADD on Vyvanse.  The patient went to the emergency room on 25 Jul 2020 with onset of dizziness.  He claims that he woke up that morning, and sat up and began to feel somewhat lightheaded, with some degree of a spinning sensation or vertigo.  He initially did not have a headache.  He felt somewhat nauseated and felt fainty.  He also noted black spots in front of the eye on the right.  He decided he was going to try to make it to work and by the time he got to work he was feeling worse.  The dizziness felt more significant, the spots in front of the right eye were more prominent, and he began to have a headache.  The patient also noted that he was feeling somewhat confused and was slurring his speech and he had some clumsiness or weakness of his right arm.  He called his wife to come get him, and he wound up going to the emergency room.  The patient underwent MRI of the brain which showed chronic small vessel changes that were relatively mild, but no acute changes were seen.  MRA of the head and neck were unremarkable.  The patient had symptoms for about 30 to 45 minutes with full clearing at that point.  The patient in retrospect indicates that over the last year or so he has had a total of 4 such episodes, all events are stereotypical, associated with spots in front of the eye,  headache, dizziness, and right arm weakness or clumsiness.  The patient reports that he has had headaches off and on throughout his entire life.  The headaches generally occur about once a week, he will take Advil with good improvement.  He indicates that his mother and his maternal grandmother also had some headache.  The patient was told to go on low-dose aspirin in the emergency room which he is now doing.  He comes to this office for further evaluation.      Past Medical History:  Diagnosis Date   Drug overdose, intentional (HCC)    Hemiplegic  migraine 08/10/2020    Past Surgical History:  Procedure Laterality Date   FASCIOTOMY  09/08/2011   Procedure: FASCIOTOMY;  Surgeon: Roberto Kerns, MD;  Location: [facility] OR;  Service: Vascular;  Laterality: Left;   FEMORAL-POPLITEAL BYPASS GRAFT  09/08/2011   Procedure: BYPASS GRAFT FEMORAL-POPLITEAL ARTERY;  Surgeon: Roberto Kerns, MD;  Location: [facility] OR;  Service: Vascular;  Laterality: Left;   NO PAST SURGERIES      Family History  Problem Relation Age of Onset   Hypertension Father     Social history:  reports that he has been smoking cigarettes. He has a 12.5 pack-year smoking history. He has never used smokeless tobacco. He reports current alcohol use of about 28.0 standard drinks of alcohol per week. He reports that he does not use drugs.  Medications:  Prior to Admission medications   Medication Sig Start Date End Date Taking? Authorizing Provider  Acetaminophen (TYLENOL PO) Take 2 tablets by mouth every 6 (six) hours as needed (pain/fever/headache).   Yes [provider]  aspirin EC 81 MG tablet Take 1 tablet (81 mg total) by mouth daily. Swallow whole. 07/25/20  Yes Roberto Kaplan, MD  calcium carbonate (TUMS - DOSED IN MG ELEMENTAL CALCIUM) 500 MG chewable tablet Chew 1 tablet by mouth daily.   Yes [provider]  dimenhyDRINATE (DRAMAMINE) 50 MG tablet Take 50 mg by mouth every 8 (eight) hours as needed.   Yes [provider]  ibuprofen (ADVIL) 600 MG tablet Take 1 tablet (600 mg total) by mouth every 6 (six) hours as needed. 08/10/19  Yes Roberto Lefevre, MD  loratadine (CLARITIN) 10 MG tablet Take 10 mg by mouth daily as needed for allergies.   Yes [provider]  meclizine (ANTIVERT) 50 MG tablet Take 1 tablet (50 mg total) by mouth 3 (three) times daily as needed. 07/25/20  Yes Knapp, Ankit, MD  VYVANSE 70 MG capsule Take 70 mg by mouth every morning. 07/06/20  Yes [provider]  amoxicillin-clavulanate (AUGMENTIN) 875-125 MG  tablet Take 1 tablet by mouth every 12 (twelve) hours. 08/10/19   Roberto Lefevre, MD  HYDROcodone-acetaminophen (NORCO/VICODIN) 5-325 MG tablet Take 1 tablet by mouth every 4 (four) hours as needed. 08/10/19   Roberto Lefevre, MD  pantoprazole (PROTONIX) 40 MG tablet Take 1 tablet (40 mg total) by mouth daily. 03/07/18   Rolly Salter, MD     No Known Allergies  ROS:  Out of a complete 14 system review of symptoms, the patient complains only of the following symptoms as noted in HPI, and all other reviewed systems are negative.     There were no vitals taken for this visit.  Physical Exam  General: well developed, well nourished, very pleasant middle-age Caucasian male, seated, in no evident distress Head: head normocephalic and atraumatic.   Neck: supple with no carotid or supraclavicular bruits Cardiovascular: regular rate  and rhythm, no murmurs Musculoskeletal: no deformity Skin:  no rash/petichiae Vascular:  Normal pulses all extremities   Neurologic Exam Mental Status: Awake and fully alert. Oriented to place and time. Recent and remote memory intact. Attention span, concentration and fund of knowledge appropriate. Mood and affect appropriate.  Cranial Nerves: Pupils equal, briskly reactive to light. Extraocular movements full without nystagmus. Visual fields full to confrontation. Hearing intact. Facial sensation intact. Face, tongue, palate moves normally and symmetrically.  Motor: Normal bulk and tone. Normal strength in all tested extremity muscles Sensory.: intact to touch , pinprick , position and vibratory sensation.  Coordination: Rapid alternating movements normal in all extremities. Finger-to-nose and heel-to-shin performed accurately bilaterally. Gait and Station: Arises from chair without difficulty. Stance is normal. Gait demonstrates normal stride length and balance without use of assistive device. Tandem walk and heel toe without difficulty.  Reflexes: 1+ and  symmetric. Toes downgoing.       Pertinent imaging:  MRI brain 07/25/20: IMPRESSION: No acute infarction, hemorrhage, or mass. Probable mild chronic microvascular ischemic changes.   No large vessel occlusion, hemodynamically significant stenosis, or evidence of dissection.   MRA head and neck 07/25/20: IMPRESSION: No acute infarction, hemorrhage, or mass. Probable mild chronic microvascular ischemic changes.   No large vessel occlusion, hemodynamically significant stenosis, or evidence of dissection.  MRA HEAD   Intracranial internal carotid arteries are patent. Middle and anterior cerebral arteries are patent. Intracranial vertebral arteries, basilar artery, posterior cerebral arteries are patent. Bilateral posterior communicating arteries are present. There is no significant stenosis or aneurysm.     Assessment/Plan:  1.  Episode of dizziness, visual disturbance, headache, and right arm weakness, probable neurologic migraine  -Currently 2 migraines per month -Continue topiramate 75 mg nightly -refill provided.  -Continue sumatriptan 100 mg daily as needed for abortive therapy - advised he can repeat x1 after 2 hours if needed. Max dose 200 mg per 24 hours.     Follow-up in 1 year or call earlier if needed    CC:  Willaim Sheng, MD   I spent 21 minutes of face-to-face and non-face-to-face time with patient.  This included previsit chart review, lab review, study review, electronic health record documentation, patient education and discussion regarding migraine headaches, continued use of topiramate for prophylactic therapy and sumatriptan for abortive therapy, and answered all other questions to patient satisfaction  Ihor Austin, AGNP-BC  [facility] [address] [place], [place] [address]  Phone [phone] Fax [phone] Note: This document was prepared with digital dictation and possible smart phrase  technology. Any transcriptional errors that result from this process are unintentional.

## 2023-02-06 ENCOUNTER — Ambulatory Visit: Payer: No Typology Code available for payment source | Admitting: Adult Health

## 2023-02-06 ENCOUNTER — Encounter: Payer: Self-pay | Admitting: Adult Health

## 2023-02-06 ENCOUNTER — Telehealth: Payer: Self-pay | Admitting: Adult Health

## 2023-02-06 VITALS — BP 108/76 | HR 71 | Ht 68.0 in | Wt 160.0 lb

## 2023-02-06 DIAGNOSIS — G43009 Migraine without aura, not intractable, without status migrainosus: Secondary | ICD-10-CM

## 2023-02-06 MED ORDER — TOPIRAMATE 25 MG PO TABS: 75.0000 mg | ORAL_TABLET | Freq: Every day | ORAL | 3 refills | Status: DC

## 2023-02-06 MED ORDER — SUMATRIPTAN SUCCINATE 100 MG PO TABS: 100.0000 mg | ORAL_TABLET | Freq: Once | ORAL | 11 refills | Status: DC | PRN

## 2023-02-06 NOTE — Telephone Encounter (Signed)
Received a call from [facility], stated they received a referral for patient but it only included the patient's name and DOB. They need demographics and office notes to go along with this referral. Please resend with correct info, I could not find that we even placed a referral for this patient.

## 2023-02-06 NOTE — Patient Instructions (Addendum)
Your Plan:  Continue topiramate 75 mg nightly for headache prevention  Continue sumatriptan as needed for headache rescue  Please call with any worsening headaches     Follow up in 1 year or call earlier if needed    Happy Holidays!!      Thank you for coming to see [place] at [facility]. I hope we have been able to provide you high quality care today.  You may receive a patient satisfaction survey over the next few weeks. We would appreciate your feedback and comments so that we may continue to improve ourselves and the health of our patients.

## 2023-02-21 NOTE — Telephone Encounter (Signed)
Received a call from [facility]. They Stated they received a referral for this patient but it only included the patient's name and DOB. They need demographics and office notes to go along with this referral, as well as a diagnosis.   A similar phone note was placed on 12/4 and never addressed.  Records and updated diagnosis can be faxed to [phone].  They confirmed the referral was coming from Ihor Austin NP and being sent to [facility]

## 2023-03-05 NOTE — Telephone Encounter (Addendum)
 I do not see a referral for this pt done by [place].  Possibly from pcp?  I called pt and LMVM for him to return call if he knows who may have sent referral for urology?

## 2023-03-13 NOTE — Telephone Encounter (Signed)
 Noted.

## 2023-03-18 DIAGNOSIS — C61 Malignant neoplasm of prostate: Secondary | ICD-10-CM | POA: Diagnosis not present

## 2023-03-18 DIAGNOSIS — R972 Elevated prostate specific antigen [PSA]: Secondary | ICD-10-CM | POA: Diagnosis not present

## 2023-03-28 DIAGNOSIS — N5201 Erectile dysfunction due to arterial insufficiency: Secondary | ICD-10-CM | POA: Diagnosis not present

## 2023-03-28 DIAGNOSIS — C61 Malignant neoplasm of prostate: Secondary | ICD-10-CM | POA: Diagnosis not present

## 2023-04-02 ENCOUNTER — Other Ambulatory Visit (HOSPITAL_COMMUNITY): Payer: Self-pay | Admitting: Urology

## 2023-04-02 DIAGNOSIS — C61 Malignant neoplasm of prostate: Secondary | ICD-10-CM

## 2023-04-05 DIAGNOSIS — E785 Hyperlipidemia, unspecified: Secondary | ICD-10-CM | POA: Diagnosis not present

## 2023-04-05 DIAGNOSIS — Z720 Tobacco use: Secondary | ICD-10-CM | POA: Diagnosis not present

## 2023-04-05 DIAGNOSIS — C61 Malignant neoplasm of prostate: Secondary | ICD-10-CM | POA: Diagnosis not present

## 2023-04-05 DIAGNOSIS — G43909 Migraine, unspecified, not intractable, without status migrainosus: Secondary | ICD-10-CM | POA: Diagnosis not present

## 2023-04-05 DIAGNOSIS — F902 Attention-deficit hyperactivity disorder, combined type: Secondary | ICD-10-CM | POA: Diagnosis not present

## 2023-04-05 DIAGNOSIS — M25511 Pain in right shoulder: Secondary | ICD-10-CM | POA: Diagnosis not present

## 2023-04-10 ENCOUNTER — Encounter (HOSPITAL_COMMUNITY)
Admission: RE | Admit: 2023-04-10 | Discharge: 2023-04-10 | Disposition: A | Discharge status: Home / Self Care | Payer: No Typology Code available for payment source | Source: Ambulatory Visit | Attending: Urology | Admitting: Urology

## 2023-04-10 DIAGNOSIS — C61 Malignant neoplasm of prostate: Secondary | ICD-10-CM | POA: Insufficient documentation

## 2023-04-10 MED ORDER — FLOTUFOLASTAT F 18 GALLIUM 296-5846 MBQ/ML IV SOLN
6.3100 | Freq: Once | INTRAVENOUS | Status: AC
  Administered 2023-04-10: 6.31 via INTRAVENOUS
  Filled 2023-04-10: qty 7

## 2023-05-09 DIAGNOSIS — C61 Malignant neoplasm of prostate: Secondary | ICD-10-CM | POA: Diagnosis not present

## 2023-05-09 DIAGNOSIS — N5201 Erectile dysfunction due to arterial insufficiency: Secondary | ICD-10-CM | POA: Diagnosis not present

## 2023-05-23 ENCOUNTER — Other Ambulatory Visit: Payer: Self-pay | Admitting: Urology

## 2023-06-10 ENCOUNTER — Other Ambulatory Visit: Payer: Self-pay

## 2023-06-10 ENCOUNTER — Emergency Department (HOSPITAL_BASED_OUTPATIENT_CLINIC_OR_DEPARTMENT_OTHER)
Admission: EM | Admit: 2023-06-10 | Discharge: 2023-06-10 | Disposition: A | Discharge status: Home / Self Care | Attending: Emergency Medicine | Admitting: Emergency Medicine

## 2023-06-10 ENCOUNTER — Encounter (HOSPITAL_BASED_OUTPATIENT_CLINIC_OR_DEPARTMENT_OTHER): Payer: Self-pay | Admitting: Emergency Medicine

## 2023-06-10 DIAGNOSIS — W57XXXA Bitten or stung by nonvenomous insect and other nonvenomous arthropods, initial encounter: Secondary | ICD-10-CM | POA: Insufficient documentation

## 2023-06-10 DIAGNOSIS — L089 Local infection of the skin and subcutaneous tissue, unspecified: Secondary | ICD-10-CM | POA: Diagnosis not present

## 2023-06-10 DIAGNOSIS — M545 Low back pain, unspecified: Secondary | ICD-10-CM | POA: Diagnosis not present

## 2023-06-10 DIAGNOSIS — M25511 Pain in right shoulder: Secondary | ICD-10-CM | POA: Diagnosis not present

## 2023-06-10 DIAGNOSIS — S1096XA Insect bite of unspecified part of neck, initial encounter: Secondary | ICD-10-CM | POA: Insufficient documentation

## 2023-06-10 DIAGNOSIS — M25512 Pain in left shoulder: Secondary | ICD-10-CM | POA: Insufficient documentation

## 2023-06-10 DIAGNOSIS — Z8546 Personal history of malignant neoplasm of prostate: Secondary | ICD-10-CM | POA: Insufficient documentation

## 2023-06-10 DIAGNOSIS — Z7982 Long term (current) use of aspirin: Secondary | ICD-10-CM | POA: Diagnosis not present

## 2023-06-10 DIAGNOSIS — S80862A Insect bite (nonvenomous), left lower leg, initial encounter: Secondary | ICD-10-CM | POA: Diagnosis not present

## 2023-06-10 DIAGNOSIS — S80861A Insect bite (nonvenomous), right lower leg, initial encounter: Secondary | ICD-10-CM | POA: Diagnosis not present

## 2023-06-10 LAB — CBC
HCT: 41 % (ref 39.0–52.0)
Hemoglobin: 13.7 g/dL (ref 13.0–17.0)
MCH: 32 pg (ref 26.0–34.0)
MCHC: 33.4 g/dL (ref 30.0–36.0)
MCV: 95.8 fL (ref 80.0–100.0)
Platelets: 287 10*3/uL (ref 150–400)
RBC: 4.28 MIL/uL (ref 4.22–5.81)
RDW: 13 % (ref 11.5–15.5)
WBC: 6.8 10*3/uL (ref 4.0–10.5)
nRBC: 0 % (ref 0.0–0.2)

## 2023-06-10 LAB — BASIC METABOLIC PANEL WITH GFR
Anion gap: 6 (ref 5–15)
BUN: 17 mg/dL (ref 6–20)
CO2: 24 mmol/L (ref 22–32)
Calcium: 8.5 mg/dL — ABNORMAL LOW (ref 8.9–10.3)
Chloride: 110 mmol/L (ref 98–111)
Creatinine, Ser: 1.04 mg/dL (ref 0.61–1.24)
GFR, Estimated: >60 mL/min (ref >60)
Glucose, Bld: 88 mg/dL (ref 70–99)
Potassium: 4.3 mmol/L (ref 3.5–5.1)
Sodium: 140 mmol/L (ref 135–145)

## 2023-06-10 LAB — RESP PANEL BY RT-PCR (RSV, FLU A&B, COVID)  RVPGX2
Influenza A by PCR: NEGATIVE
Influenza B by PCR: NEGATIVE
Resp Syncytial Virus by PCR: NEGATIVE
SARS Coronavirus 2 by RT PCR: NEGATIVE

## 2023-06-10 MED ORDER — DIPHENHYDRAMINE HCL 25 MG PO CAPS
25.0000 mg | ORAL_CAPSULE | Freq: Once | ORAL | Status: AC
  Administered 2023-06-10: 25 mg via ORAL
  Filled 2023-06-10: qty 1

## 2023-06-10 MED ORDER — KETOROLAC TROMETHAMINE 15 MG/ML IJ SOLN
15.0000 mg | Freq: Once | INTRAMUSCULAR | Status: DC
  Filled 2023-06-10: qty 1

## 2023-06-10 MED ORDER — CYCLOBENZAPRINE HCL 5 MG PO TABS
5.0000 mg | ORAL_TABLET | Freq: Once | ORAL | Status: AC
  Administered 2023-06-10: 5 mg via ORAL
  Filled 2023-06-10: qty 1

## 2023-06-10 MED ORDER — KETOROLAC TROMETHAMINE 15 MG/ML IJ SOLN
15.0000 mg | Freq: Once | INTRAMUSCULAR | Status: AC
  Administered 2023-06-10: 15 mg via INTRAVENOUS

## 2023-06-10 NOTE — Discharge Instructions (Signed)
 Take 1000 mg of Tylenol every 6 hours.  You can take ibuprofen every 8 hours.  Make sure staying well-hydrated.  Return to emergency room if you have any new or worsening symptoms.  Vitals and workup here are reassuring. Please return if symptoms are worse.

## 2023-06-10 NOTE — ED Provider Notes (Signed)
 [place] EMERGENCY DEPARTMENT AT [facility] Provider Note   CSN: [id number] Arrival date & time: 06/10/23  1810     History  Chief Complaint  Patient presents with   Insect Bite    Roberto Knapp is a 53 y.o. male.  Patient with past medical history of prostate cancer, drug overdose reporting to the emergency room with complaint of 2 days of muscle aches and generally feeling unwell.  He reports he has not felt well rested because he has very restless legs.  He reports around the same time this started he noticed 2 bumps on the back of his neck.  He did not ever feel a bite or see a bite but he assumes that it is some kind of insect bite.  He has noted some discomfort and itching surrounding the site.  He has tried Benadryl for the symptoms which has helped improve them.  He notes he is still very restless and unable to get sleep even after the Benadryl which is abnormal for him.  He denies any injury trauma or fall.  He denies any urinary symptoms like dysuria or urinary frequency.  He denies any focal abdominal pain.  No Nausea vomiting or diarrhea.  He denies any chest pain shortness of breath or cough.  Has not noticed any fever or chills.  He has no history of drug use.    HPI     Home Medications Prior to Admission medications   Medication Sig Start Date End Date Taking? Authorizing Provider  Acetaminophen (TYLENOL PO) Take 2 tablets by mouth every 6 (six) hours as needed (pain/fever/headache).    [provider]  aspirin EC 81 MG tablet Take 1 tablet (81 mg total) by mouth daily. Swallow whole. 07/25/20   Derwood Kaplan, MD  calcium carbonate (TUMS - DOSED IN MG ELEMENTAL CALCIUM) 500 MG chewable tablet Chew 1 tablet by mouth daily.    [provider]  loratadine (CLARITIN) 10 MG tablet Take 10 mg by mouth daily as needed for allergies.    [provider]  meclizine (ANTIVERT) 50 MG tablet Take 1 tablet (50 mg total) by mouth 3 (three) times  daily as needed. 07/25/20   Derwood Kaplan, MD  sildenafil (VIAGRA) 100 MG tablet Take 100 mg by mouth daily. 12/17/22   [provider]  SUMAtriptan (IMITREX) 100 MG tablet Take 1 tablet (100 mg total) by mouth once as needed for up to 1 dose for migraine. May repeat in 2 hours if headache persists or recurs. 02/06/23   Ihor Austin, NP  topiramate (TOPAMAX) 25 MG tablet Take 3 tablets (75 mg total) by mouth at bedtime. 02/06/23   Ihor Austin, NP  VYVANSE 70 MG capsule Take 70 mg by mouth every morning. 07/06/20   [provider]      Allergies    Patient has no known allergies.    Review of Systems   Review of Systems  Musculoskeletal:  Positive for arthralgias.  Skin:  Positive for wound.    Physical Exam Updated Vital Signs BP (!) 144/119 (BP Location: Right Arm)   Pulse 80   Temp 97.8 F (36.6 C)   Resp 16   SpO2 100%  Physical Exam Vitals and nursing note reviewed.  Constitutional:      General: He is not in acute distress.    Appearance: He is not toxic-appearing.  HENT:     Head: Normocephalic and atraumatic.  Eyes:     General: No scleral  icterus.    Conjunctiva/sclera: Conjunctivae normal.  Cardiovascular:     Rate and Rhythm: Normal rate and regular rhythm.     Pulses: Normal pulses.     Heart sounds: Normal heart sounds.  Pulmonary:     Effort: Pulmonary effort is normal. No respiratory distress.     Breath sounds: Normal breath sounds.  Abdominal:     General: Abdomen is flat. Bowel sounds are normal.     Palpations: Abdomen is soft.     Tenderness: There is no abdominal tenderness.  Musculoskeletal:     Comments: Bilateral low back pain and hip pain, worse with flexion and extention, no spinal midline tenderness.  Bilateral shoulder pain, worse with flexion and extention.  Skin:    General: Skin is warm and dry.     Findings: No lesion.     Comments: Two small nodules consistent with bug bit.   Neurological:     General: No focal  deficit present.     Mental Status: He is alert and oriented to person, place, and time. Mental status is at baseline.     ED Results / Procedures / Treatments   Labs (all labs ordered are listed, but only abnormal results are displayed) Labs Reviewed  BASIC METABOLIC PANEL WITH GFR - Abnormal; Notable for the following components:      Result Value   Calcium 8.5 (*)    All other components within normal limits  RESP PANEL BY RT-PCR (RSV, FLU A&B, COVID)  RVPGX2  CBC    EKG None  Radiology No results found.  Procedures Procedures    Medications Ordered in ED Medications  diphenhydrAMINE (BENADRYL) capsule 25 mg (25 mg Oral Given 06/10/23 1940)  cyclobenzaprine (FLEXERIL) tablet 5 mg (5 mg Oral Given 06/10/23 1938)  ketorolac (TORADOL) 15 MG/ML injection 15 mg (15 mg Intravenous Given 06/10/23 1940)    ED Course/ Medical Decision Making/ A&P Clinical Course as of 06/10/23 2131  Mon Jun 10, 2023  2111 Feeling better, requesting discharge.  [JB]    Clinical Course User Index [JB] Kasper Mudrick, Horald Chestnut, PA-C                                 Medical Decision Making Amount and/or Complexity of Data Reviewed Labs: ordered.  Risk Prescription drug management.   This patient presents to the ED for concern of muscles aches, this involves an extensive number of treatment options, and is a complaint that carries with it a high risk of complications and morbidity.  The differential diagnosis includes URI, dehydration, electrolyte abnormality, muscle spasm   Co morbidities that complicate the patient evaluation  Prostate cancer   Additional history obtained:  Additional history obtained from reviewed recent PET scan, reviewed recent ED visit 04/05/2023.  Patient is scheduled for prostate procedure with urology 07/15/2023 for prostate cancer   Lab Tests:  I personally interpreted labs.  The pertinent results include:   CBC without leukocytosis no anemia.  BMP without  significant electrolyte abnormality.  Respiratory panel is negative   Cardiac Monitoring: / EKG:  The patient was maintained on a cardiac monitor.     Problem List / ED Course / Critical interventions / Medication management  Patient reporting to emergency room with bug bite associated with muscle aches.  Patient thinks she had spider bite.  He does have 2 areas of bug bite and the symptoms started shortly after she found these  bug bites. Denies IVDU, denies fevers or chills. He reports he was seen spiders around his home before.  Given symptoms I think it is possible this is related to black widow spider as family is concerned.  He is overall hemodynamically stable and well-appearing.  He has no signs of systemic illness.  He has no significant abdominal pain nausea vomiting or diarrhea.  His symptoms have been well-controlled with Toradol, Flexeril.  And his labs are reassuring.  He has negative respiratory panel which are obtained secondary to muscle aches and fatigue.  I did discuss with patient family member if symptoms continue he should follow-up with primary care versus return to ER for further workup.  No imaging was done today as patient has no area of focal point tenderness and is complaining primarily of generalized bodyaches.  Discussed symptom management and need for return.  Discussed keeping the bug bites clean dry covered.  He can use hydrocortisone ointment over the area of bite and Benadryl as needed for itching. I ordered medication including toradol  Reevaluation of the patient after these medicines showed that the patient resolved I have reviewed the patients home medicines and have made adjustments as needed   Plan  F/u w/ PCP in 2-3d to ensure resolution of sx.  Patient was given return precautions. Patient stable for discharge at this time.  Patient educated on sx/dx and verbalized understanding of plan. Return to ER w/ new or worsening sx.          Final  Clinical Impression(s) / ED Diagnoses Final diagnoses:  Bug bite, initial encounter    Rx / DC Orders ED Discharge Orders     None         Reinaldo Raddle 06/10/23 2136    Alvira Monday, MD 06/11/23 1054

## 2023-06-10 NOTE — ED Triage Notes (Signed)
 Noticed two bumps on neck- assumes to be spider bites- Saturday night. No rings. Still swollen. Complains of lower back pain and sore legs. Some soreness in neck extension, not flexion. Denies CP, SOB, urinary symptoms.

## 2023-07-01 NOTE — Progress Notes (Addendum)
 COVID Vaccine received:  []  No [x]  Yes Date of any COVID positive Test in last 90 days: no PCP - Rometta Coad MD Cardiologist - Alois Arnt MD  Chest x-ray - chest CT 10/26/22 Epic EKG -  01/01/23 Epic Stress Test -  ECHO - 02/11/18 Epic Cardiac Cath -   Bowel Prep - [x]  No  []   Yes ______  Pacemaker / ICD device [x]  No []  Yes   Spinal Cord Stimulator:[x]  No []  Yes       History of Sleep Apnea? []  No [x]  Yes   CPAP used?- []  No [x]  Yes    Does the patient monitor blood sugar?          [x]  No []  Yes  []  N/A  Patient has: [x]  NO Hx DM   []  Pre-DM                 []  DM1  []   DM2 Does patient have a [employer] or Dexacom? []  No []  Yes   Fasting Blood Sugar Ranges-  Checks Blood Sugar _____ times a day  GLP1 agonist / usual dose - no GLP1 instructions:  SGLT-2 inhibitors / usual dose - no SGLT-2 instructions:   Blood Thinner / Instructions:no Aspirin  Instructions:ASA 81 mg Instructed by MD to  stop 5 days PTS. Last dose May 6.  Comments:   Activity level: Patient is able  to climb a flight of stairs without difficulty; [x]  No CP  [x]  No SOB,    Patient can  perform ADLs without assistance.   Anesthesia review:   Patient denies shortness of breath, fever, cough and chest pain at PAT appointment.  Patient verbalized understanding and agreement to the Pre-Surgical Instructions that were given to them at this PAT appointment. Patient was also educated of the need to review these PAT instructions again prior to his/her surgery.I reviewed the appropriate phone numbers to call if they have any and questions or concerns.

## 2023-07-01 NOTE — Patient Instructions (Addendum)
 SURGICAL WAITING ROOM VISITATION  Patients having surgery or a procedure may have no more than 2 support people in the waiting area - these visitors may rotate.    Children under the age of 42 must have an adult with them who is not the patient.  Due to an increase in RSV and influenza rates and associated hospitalizations, children ages 64 and under may not visit patients in [facility] hospitals.  Visitors with respiratory illnesses are discouraged from visiting and should remain at home.  If the patient needs to stay at the hospital during part of their recovery, the visitor guidelines for inpatient rooms apply. Pre-op nurse will coordinate an appropriate time for 1 support person to accompany patient in pre-op.  This support person may not rotate.    Please refer to the [facility] website for the visitor guidelines for Inpatients (after your surgery is over and you are in a regular room).       Your procedure is scheduled on: 07/15/23   Report to [facility] Main Entrance    Report to admitting at 10:45  AM   Call this number if you have problems the morning of surgery [phone]   Do not eat food or drink liquids :After Midnight. But may have sips of liquids with meds.     Follow bowel prep from surgeon's office.   Oral Hygiene is also important to reduce your risk of infection.                                    Remember - BRUSH YOUR TEETH THE MORNING OF SURGERY WITH YOUR REGULAR TOOTHPASTE   Do NOT smoke after Midnight   Stop all vitamins and herbal supplements 7 days before surgery.   Take these medicines the morning of surgery with A SIP OF WATER: Atorvastatin, Loratadine(claritin)  Bring CPAP mask and tubing day of surgery.                              You may not have any metal on your body including hair pins, jewelry, and body piercing             Do not wear lotions, powders, cologne, or deodorant              Men may shave face and neck.   Do  not bring valuables to the hospital. [facility] IS NOT             RESPONSIBLE   FOR VALUABLES.   Contacts, glasses, dentures or bridgework may not be worn into surgery.   Bring small overnight bag day of surgery.   DO NOT BRING YOUR HOME MEDICATIONS TO THE HOSPITAL. PHARMACY WILL DISPENSE MEDICATIONS LISTED ON YOUR MEDICATION LIST TO YOU DURING YOUR ADMISSION IN THE HOSPITAL!    Patients discharged on the day of surgery will not be allowed to drive home.  Someone NEEDS to stay with you for the first 24 hours after anesthesia.   Special Instructions: Bring a copy of your healthcare power of attorney and living will documents the day of surgery if you haven't scanned them before.              Please read over the following fact sheets you were given: IF YOU HAVE QUESTIONS ABOUT YOUR PRE-OP INSTRUCTIONS PLEASE CALL [phone] Ammon Bales   If  you received a COVID test during your pre-op visit  it is requested that you wear a mask when out in public, stay away from anyone that may not be feeling well and notify your surgeon if you develop symptoms. If you test positive for Covid or have been in contact with anyone that has tested positive in the last 10 days please notify you surgeon.     - Preparing for Surgery Before surgery, you can play an important role.  Because skin is not sterile, your skin needs to be as free of germs as possible.  You can reduce the number of germs on your skin by washing with CHG (chlorahexidine gluconate) soap before surgery.  CHG is an antiseptic cleaner which kills germs and bonds with the skin to continue killing germs even after washing. Please DO NOT use if you have an allergy to CHG or antibacterial soaps.  If your skin becomes reddened/irritated stop using the CHG and inform your nurse when you arrive at Short Stay. Do not shave (including legs and underarms) for at least 48 hours prior to the first CHG shower.  You may shave your face/neck.  Please  follow these instructions carefully:  1.  Shower with CHG Soap the night before surgery and the  morning of surgery.  2.  If you choose to wash your hair, wash your hair first as usual with your normal  shampoo.  3.  After you shampoo, rinse your hair and body thoroughly to remove the shampoo.                             4.  Use CHG as you would any other liquid soap.  You can apply chg directly to the skin and wash.  Gently with a scrungie or clean washcloth.  5.  Apply the CHG Soap to your body ONLY FROM THE NECK DOWN.   Do   not use on face/ open                           Wound or open sores. Avoid contact with eyes, ears mouth and   genitals (private parts).                       Wash face,  Genitals (private parts) with your normal soap.             6.  Wash thoroughly, paying special attention to the area where your    surgery  will be performed.  7.  Thoroughly rinse your body with warm water from the neck down.  8.  DO NOT shower/wash with your normal soap after using and rinsing off the CHG Soap.                9.  Pat yourself dry with a clean towel.            10.  Wear clean pajamas.            11.  Place clean sheets on your bed the night of your first shower and do not  sleep with pets. Day of Surgery : Do not apply any lotions/deodorants the morning of surgery.  Please wear clean clothes to the hospital/surgery center.  FAILURE TO FOLLOW THESE INSTRUCTIONS MAY RESULT IN THE CANCELLATION OF YOUR SURGERY  PATIENT SIGNATURE_________________________________  NURSE SIGNATURE__________________________________  ________________________________________________________________________

## 2023-07-04 ENCOUNTER — Encounter (HOSPITAL_COMMUNITY)
Admission: RE | Admit: 2023-07-04 | Discharge: 2023-07-04 | Disposition: A | Discharge status: Home / Self Care | Source: Ambulatory Visit | Attending: Urology | Admitting: Urology

## 2023-07-04 ENCOUNTER — Other Ambulatory Visit: Payer: Self-pay

## 2023-07-04 ENCOUNTER — Encounter (HOSPITAL_COMMUNITY): Payer: Self-pay

## 2023-07-04 DIAGNOSIS — Z01812 Encounter for preprocedural laboratory examination: Secondary | ICD-10-CM | POA: Diagnosis not present

## 2023-07-04 HISTORY — DX: Attention-deficit hyperactivity disorder, unspecified type: F90.9

## 2023-07-04 HISTORY — DX: Chronic kidney disease, unspecified: N18.9

## 2023-07-04 HISTORY — DX: Malignant (primary) neoplasm, unspecified: C80.1

## 2023-07-04 HISTORY — DX: Sleep apnea, unspecified: G47.30

## 2023-07-04 LAB — BASIC METABOLIC PANEL WITH GFR
Anion gap: 7 (ref 5–15)
BUN: 16 mg/dL (ref 6–20)
CO2: 20 mmol/L — ABNORMAL LOW (ref 22–32)
Calcium: 8.9 mg/dL (ref 8.9–10.3)
Chloride: 109 mmol/L (ref 98–111)
Creatinine, Ser: 1.09 mg/dL (ref 0.61–1.24)
GFR, Estimated: >60 mL/min (ref >60)
Glucose, Bld: 92 mg/dL (ref 70–99)
Potassium: 4.1 mmol/L (ref 3.5–5.1)
Sodium: 136 mmol/L (ref 135–145)

## 2023-07-04 LAB — CBC
HCT: 45.4 % (ref 39.0–52.0)
Hemoglobin: 14.7 g/dL (ref 13.0–17.0)
MCH: 31.1 pg (ref 26.0–34.0)
MCHC: 32.4 g/dL (ref 30.0–36.0)
MCV: 96 fL (ref 80.0–100.0)
Platelets: 338 K/uL (ref 150–400)
RBC: 4.73 MIL/uL (ref 4.22–5.81)
RDW: 12.6 % (ref 11.5–15.5)
WBC: 6.5 K/uL (ref 4.0–10.5)
nRBC: 0 % (ref 0.0–0.2)

## 2023-07-04 LAB — TYPE AND SCREEN
ABO/RH(D): A POS
Antibody Screen: NEGATIVE

## 2023-07-09 DIAGNOSIS — C61 Malignant neoplasm of prostate: Secondary | ICD-10-CM | POA: Diagnosis not present

## 2023-07-15 ENCOUNTER — Encounter (HOSPITAL_COMMUNITY)
Admission: RE | Disposition: A | Discharge status: Home / Self Care | Payer: Self-pay | Source: Ambulatory Visit | Attending: Urology

## 2023-07-15 ENCOUNTER — Ambulatory Visit (HOSPITAL_COMMUNITY): Payer: Self-pay | Admitting: Anesthesiology

## 2023-07-15 ENCOUNTER — Encounter (HOSPITAL_COMMUNITY): Payer: Self-pay | Admitting: Urology

## 2023-07-15 ENCOUNTER — Ambulatory Visit (HOSPITAL_BASED_OUTPATIENT_CLINIC_OR_DEPARTMENT_OTHER): Payer: Self-pay | Admitting: Anesthesiology

## 2023-07-15 ENCOUNTER — Ambulatory Visit (HOSPITAL_COMMUNITY)
Admission: RE | Admit: 2023-07-15 | Discharge: 2023-07-16 | Disposition: A | Discharge status: Home / Self Care | Payer: Self-pay | Source: Ambulatory Visit | Attending: Urology | Admitting: Urology

## 2023-07-15 ENCOUNTER — Other Ambulatory Visit: Payer: Self-pay

## 2023-07-15 DIAGNOSIS — F1290 Cannabis use, unspecified, uncomplicated: Secondary | ICD-10-CM | POA: Insufficient documentation

## 2023-07-15 DIAGNOSIS — N529 Male erectile dysfunction, unspecified: Secondary | ICD-10-CM | POA: Insufficient documentation

## 2023-07-15 DIAGNOSIS — G4733 Obstructive sleep apnea (adult) (pediatric): Secondary | ICD-10-CM | POA: Diagnosis not present

## 2023-07-15 DIAGNOSIS — I4891 Unspecified atrial fibrillation: Secondary | ICD-10-CM | POA: Insufficient documentation

## 2023-07-15 DIAGNOSIS — F17210 Nicotine dependence, cigarettes, uncomplicated: Secondary | ICD-10-CM | POA: Insufficient documentation

## 2023-07-15 DIAGNOSIS — N189 Chronic kidney disease, unspecified: Secondary | ICD-10-CM | POA: Diagnosis not present

## 2023-07-15 DIAGNOSIS — C61 Malignant neoplasm of prostate: Secondary | ICD-10-CM | POA: Diagnosis not present

## 2023-07-15 HISTORY — PX: ROBOT ASSISTED LAPAROSCOPIC RADICAL PROSTATECTOMY: SHX5141

## 2023-07-15 LAB — CBC
HCT: 46 % (ref 39.0–52.0)
Hemoglobin: 14.6 g/dL (ref 13.0–17.0)
MCH: 31.2 pg (ref 26.0–34.0)
MCHC: 31.7 g/dL (ref 30.0–36.0)
MCV: 98.3 fL (ref 80.0–100.0)
Platelets: 277 10*3/uL (ref 150–400)
RBC: 4.68 MIL/uL (ref 4.22–5.81)
RDW: 12.6 % (ref 11.5–15.5)
WBC: 16 10*3/uL — ABNORMAL HIGH (ref 4.0–10.5)
nRBC: 0 % (ref 0.0–0.2)

## 2023-07-15 LAB — BASIC METABOLIC PANEL WITH GFR
Anion gap: 8 (ref 5–15)
BUN: 12 mg/dL (ref 6–20)
CO2: 19 mmol/L — ABNORMAL LOW (ref 22–32)
Calcium: 8.5 mg/dL — ABNORMAL LOW (ref 8.9–10.3)
Chloride: 109 mmol/L (ref 98–111)
Creatinine, Ser: 0.93 mg/dL (ref 0.61–1.24)
GFR, Estimated: >60 mL/min (ref >60)
Glucose, Bld: 119 mg/dL — ABNORMAL HIGH (ref 70–99)
Potassium: 4.8 mmol/L (ref 3.5–5.1)
Sodium: 136 mmol/L (ref 135–145)

## 2023-07-15 SURGERY — PROSTATECTOMY, RADICAL, ROBOT-ASSISTED, LAPAROSCOPIC
Anesthesia: General

## 2023-07-15 MED ORDER — BUPIVACAINE LIPOSOME 1.3 % IJ SUSP
INTRAMUSCULAR | Status: DC | PRN
  Administered 2023-07-15: 20 mL

## 2023-07-15 MED ORDER — HYDROMORPHONE HCL 1 MG/ML IJ SOLN
INTRAMUSCULAR | Status: AC
  Filled 2023-07-15: qty 1

## 2023-07-15 MED ORDER — LORATADINE 10 MG PO TABS: 10.0000 mg | ORAL_TABLET | Freq: Every day | ORAL | Status: DC | PRN

## 2023-07-15 MED ORDER — BUPIVACAINE LIPOSOME 1.3 % IJ SUSP
INTRAMUSCULAR | Status: AC
  Filled 2023-07-15: qty 20

## 2023-07-15 MED ORDER — AMPHETAMINE-DEXTROAMPHETAMINE 10 MG PO TABS
10.0000 mg | ORAL_TABLET | Freq: Every day | ORAL | Status: DC
  Filled 2023-07-15 (×2): qty 1

## 2023-07-15 MED ORDER — FENTANYL CITRATE (PF) 100 MCG/2ML IJ SOLN
INTRAMUSCULAR | Status: DC | PRN
  Administered 2023-07-15 (×2): 50 ug via INTRAVENOUS

## 2023-07-15 MED ORDER — DROPERIDOL 2.5 MG/ML IJ SOLN: 0.6250 mg | Freq: Once | INTRAMUSCULAR | Status: DC | PRN

## 2023-07-15 MED ORDER — SUMATRIPTAN SUCCINATE 50 MG PO TABS
100.0000 mg | ORAL_TABLET | Freq: Once | ORAL | Status: DC | PRN
Start: 2023-07-15 — End: 2023-07-16

## 2023-07-15 MED ORDER — OXYCODONE-ACETAMINOPHEN 5-325 MG PO TABS: 1.0000 | ORAL_TABLET | ORAL | 0 refills | Status: AC | PRN

## 2023-07-15 MED ORDER — ORAL CARE MOUTH RINSE: 15.0000 mL | Freq: Once | OROMUCOSAL | Status: AC

## 2023-07-15 MED ORDER — OXYCODONE HCL 5 MG PO TABS
5.0000 mg | ORAL_TABLET | ORAL | Status: DC | PRN
  Administered 2023-07-15 – 2023-07-16 (×2): 5 mg via ORAL
  Filled 2023-07-15 (×2): qty 1

## 2023-07-15 MED ORDER — DIPHENHYDRAMINE HCL 50 MG/ML IJ SOLN: 12.5000 mg | Freq: Four times a day (QID) | INTRAMUSCULAR | Status: DC | PRN

## 2023-07-15 MED ORDER — HYDROMORPHONE HCL 1 MG/ML IJ SOLN
0.2500 mg | INTRAMUSCULAR | Status: DC | PRN
  Administered 2023-07-15 (×4): 0.5 mg via INTRAVENOUS

## 2023-07-15 MED ORDER — ONDANSETRON HCL 4 MG/2ML IJ SOLN
INTRAMUSCULAR | Status: AC
  Filled 2023-07-15: qty 2

## 2023-07-15 MED ORDER — SODIUM CHLORIDE (PF) 0.9 % IJ SOLN
INTRAMUSCULAR | Status: AC
  Filled 2023-07-15: qty 20

## 2023-07-15 MED ORDER — SODIUM CHLORIDE 0.9% FLUSH
3.0000 mL | Freq: Two times a day (BID) | INTRAVENOUS | Status: DC
  Administered 2023-07-15 – 2023-07-16 (×2): 3 mL via INTRAVENOUS

## 2023-07-15 MED ORDER — SODIUM CHLORIDE 0.9% FLUSH
INTRAVENOUS | Status: DC | PRN
  Administered 2023-07-15: 20 mL

## 2023-07-15 MED ORDER — CALCIUM CARBONATE ANTACID 500 MG PO CHEW: 1.0000 | CHEWABLE_TABLET | Freq: Every day | ORAL | Status: DC | PRN

## 2023-07-15 MED ORDER — MORPHINE SULFATE (PF) 2 MG/ML IV SOLN
2.0000 mg | INTRAVENOUS | Status: DC | PRN
  Administered 2023-07-15: 2 mg via INTRAVENOUS
  Administered 2023-07-15: 4 mg via INTRAVENOUS
  Administered 2023-07-15: 2 mg via INTRAVENOUS
  Administered 2023-07-16 (×2): 4 mg via INTRAVENOUS
  Administered 2023-07-16: 2 mg via INTRAVENOUS
  Filled 2023-07-15: qty 1
  Filled 2023-07-15 (×3): qty 2
  Filled 2023-07-15: qty 1

## 2023-07-15 MED ORDER — TOPIRAMATE 25 MG PO TABS
75.0000 mg | ORAL_TABLET | Freq: Every day | ORAL | Status: DC
  Administered 2023-07-15: 75 mg via ORAL
  Filled 2023-07-15: qty 3

## 2023-07-15 MED ORDER — LISDEXAMFETAMINE DIMESYLATE 70 MG PO CAPS: 70.0000 mg | ORAL_CAPSULE | Freq: Every morning | ORAL | Status: DC

## 2023-07-15 MED ORDER — HYDROMORPHONE HCL 2 MG/ML IJ SOLN
INTRAMUSCULAR | Status: AC
  Filled 2023-07-15: qty 1

## 2023-07-15 MED ORDER — KCL IN DEXTROSE-NACL 20-5-0.45 MEQ/L-%-% IV SOLN
INTRAVENOUS | Status: DC
  Filled 2023-07-15: qty 1000

## 2023-07-15 MED ORDER — ATORVASTATIN CALCIUM 10 MG PO TABS
10.0000 mg | ORAL_TABLET | Freq: Every day | ORAL | Status: DC
  Administered 2023-07-15 – 2023-07-16 (×2): 10 mg via ORAL
  Filled 2023-07-15 (×2): qty 1

## 2023-07-15 MED ORDER — SODIUM CHLORIDE 0.9 % IV BOLUS
1000.0000 mL | Freq: Once | INTRAVENOUS | Status: AC
  Administered 2023-07-15: 1000 mL via INTRAVENOUS

## 2023-07-15 MED ORDER — SODIUM CHLORIDE 0.9% FLUSH: 3.0000 mL | INTRAVENOUS | Status: DC | PRN

## 2023-07-15 MED ORDER — MECLIZINE HCL 25 MG PO TABS: 50.0000 mg | ORAL_TABLET | Freq: Three times a day (TID) | ORAL | Status: DC | PRN

## 2023-07-15 MED ORDER — DEXMEDETOMIDINE HCL IN NACL 80 MCG/20ML IV SOLN
INTRAVENOUS | Status: AC
  Filled 2023-07-15: qty 20

## 2023-07-15 MED ORDER — DIPHENHYDRAMINE HCL 12.5 MG/5ML PO ELIX: 12.5000 mg | ORAL_SOLUTION | Freq: Four times a day (QID) | ORAL | Status: DC | PRN

## 2023-07-15 MED ORDER — FENTANYL CITRATE (PF) 100 MCG/2ML IJ SOLN
INTRAMUSCULAR | Status: AC
  Filled 2023-07-15: qty 2

## 2023-07-15 MED ORDER — PROPOFOL 10 MG/ML IV BOLUS
INTRAVENOUS | Status: DC | PRN
  Administered 2023-07-15: 150 mg via INTRAVENOUS
  Administered 2023-07-15: 50 mg via INTRAVENOUS

## 2023-07-15 MED ORDER — DOCUSATE SODIUM 100 MG PO CAPS
100.0000 mg | ORAL_CAPSULE | Freq: Two times a day (BID) | ORAL | Status: DC
  Administered 2023-07-15 – 2023-07-16 (×2): 100 mg via ORAL
  Filled 2023-07-15 (×2): qty 1

## 2023-07-15 MED ORDER — DEXAMETHASONE SODIUM PHOSPHATE 10 MG/ML IJ SOLN
INTRAMUSCULAR | Status: DC | PRN
  Administered 2023-07-15: 10 mg via INTRAVENOUS

## 2023-07-15 MED ORDER — MIDAZOLAM HCL 5 MG/5ML IJ SOLN
INTRAMUSCULAR | Status: DC | PRN
Start: 2023-07-15 — End: 2023-07-15
  Administered 2023-07-15: 2 mg via INTRAVENOUS

## 2023-07-15 MED ORDER — ONDANSETRON HCL 4 MG/2ML IJ SOLN
INTRAMUSCULAR | Status: DC | PRN
  Administered 2023-07-15: 4 mg via INTRAVENOUS

## 2023-07-15 MED ORDER — CHLORHEXIDINE GLUCONATE 0.12 % MT SOLN
15.0000 mL | Freq: Once | OROMUCOSAL | Status: AC
  Administered 2023-07-15: 15 mL via OROMUCOSAL

## 2023-07-15 MED ORDER — OXYCODONE HCL 5 MG PO TABS: 5.0000 mg | ORAL_TABLET | Freq: Once | ORAL | Status: DC | PRN

## 2023-07-15 MED ORDER — ONDANSETRON HCL 4 MG/2ML IJ SOLN: 4.0000 mg | Freq: Once | INTRAMUSCULAR | Status: DC | PRN

## 2023-07-15 MED ORDER — HYDROMORPHONE HCL 1 MG/ML IJ SOLN
0.5000 mg | INTRAMUSCULAR | Status: AC | PRN
  Administered 2023-07-15 (×2): 0.5 mg via INTRAVENOUS

## 2023-07-15 MED ORDER — MIDAZOLAM HCL 2 MG/2ML IJ SOLN
INTRAMUSCULAR | Status: AC
  Filled 2023-07-15: qty 2

## 2023-07-15 MED ORDER — ROCURONIUM BROMIDE 10 MG/ML (PF) SYRINGE
PREFILLED_SYRINGE | INTRAVENOUS | Status: DC | PRN
  Administered 2023-07-15: 80 mg via INTRAVENOUS
  Administered 2023-07-15: 20 mg via INTRAVENOUS

## 2023-07-15 MED ORDER — CEFAZOLIN SODIUM-DEXTROSE 2-4 GM/100ML-% IV SOLN
2.0000 g | INTRAVENOUS | Status: AC
  Administered 2023-07-15: 2 g via INTRAVENOUS
  Filled 2023-07-15: qty 100

## 2023-07-15 MED ORDER — CHLORHEXIDINE GLUCONATE CLOTH 2 % EX PADS
6.0000 | MEDICATED_PAD | Freq: Every day | CUTANEOUS | Status: DC
  Administered 2023-07-16: 6 via TOPICAL

## 2023-07-15 MED ORDER — ACETAMINOPHEN 500 MG PO TABS
1000.0000 mg | ORAL_TABLET | Freq: Four times a day (QID) | ORAL | Status: AC
  Administered 2023-07-15 – 2023-07-16 (×4): 1000 mg via ORAL
  Filled 2023-07-15 (×4): qty 2

## 2023-07-15 MED ORDER — SODIUM CHLORIDE 0.9 % IV SOLN: 250.0000 mL | INTRAVENOUS | Status: DC | PRN

## 2023-07-15 MED ORDER — FLEET ENEMA RE ENEM: 1.0000 | ENEMA | Freq: Once | RECTAL | Status: DC

## 2023-07-15 MED ORDER — OXYCODONE HCL 5 MG/5ML PO SOLN: 5.0000 mg | Freq: Once | ORAL | Status: DC | PRN

## 2023-07-15 MED ORDER — PROPOFOL 10 MG/ML IV BOLUS
INTRAVENOUS | Status: AC
  Filled 2023-07-15: qty 20

## 2023-07-15 MED ORDER — ZOLPIDEM TARTRATE 5 MG PO TABS: 5.0000 mg | ORAL_TABLET | Freq: Every evening | ORAL | Status: DC | PRN

## 2023-07-15 MED ORDER — MAGNESIUM CITRATE PO SOLN: 1.0000 | Freq: Once | ORAL | Status: DC

## 2023-07-15 MED ORDER — HYDROMORPHONE HCL 1 MG/ML IJ SOLN
INTRAMUSCULAR | Status: DC | PRN
  Administered 2023-07-15: .5 mg via INTRAVENOUS
  Administered 2023-07-15: 1 mg via INTRAVENOUS
  Administered 2023-07-15: .5 mg via INTRAVENOUS

## 2023-07-15 MED ORDER — ONDANSETRON HCL 4 MG/2ML IJ SOLN
4.0000 mg | INTRAMUSCULAR | Status: DC | PRN
  Administered 2023-07-15: 4 mg via INTRAVENOUS
  Filled 2023-07-15: qty 2

## 2023-07-15 MED ORDER — OXYBUTYNIN CHLORIDE ER 10 MG PO TB24
10.0000 mg | ORAL_TABLET | Freq: Every day | ORAL | Status: DC
  Administered 2023-07-15 – 2023-07-16 (×2): 10 mg via ORAL
  Filled 2023-07-15 (×2): qty 1

## 2023-07-15 MED ORDER — LIDOCAINE 2% (20 MG/ML) 5 ML SYRINGE
INTRAMUSCULAR | Status: DC | PRN
Start: 2023-07-15 — End: 2023-07-15
  Administered 2023-07-15: 80 mg via INTRAVENOUS

## 2023-07-15 MED ORDER — SUGAMMADEX SODIUM 200 MG/2ML IV SOLN
INTRAVENOUS | Status: DC | PRN
  Administered 2023-07-15: 300 mg via INTRAVENOUS

## 2023-07-15 MED ORDER — MORPHINE SULFATE (PF) 2 MG/ML IV SOLN
INTRAVENOUS | Status: AC
  Filled 2023-07-15: qty 1

## 2023-07-15 MED ORDER — KETAMINE HCL 50 MG/5ML IJ SOSY
PREFILLED_SYRINGE | INTRAMUSCULAR | Status: DC | PRN
  Administered 2023-07-15: 20 mg via INTRAVENOUS
  Administered 2023-07-15 (×2): 10 mg via INTRAVENOUS

## 2023-07-15 MED ORDER — LACTATED RINGERS IV SOLN: INTRAVENOUS | Status: DC

## 2023-07-15 MED ORDER — DEXAMETHASONE SODIUM PHOSPHATE 10 MG/ML IJ SOLN
INTRAMUSCULAR | Status: AC
  Filled 2023-07-15: qty 1

## 2023-07-15 MED ORDER — DOCUSATE SODIUM 100 MG PO CAPS: 100.0000 mg | ORAL_CAPSULE | Freq: Every day | ORAL | 0 refills | Status: AC | PRN

## 2023-07-15 MED ORDER — KETAMINE HCL 50 MG/5ML IJ SOSY
PREFILLED_SYRINGE | INTRAMUSCULAR | Status: AC
  Filled 2023-07-15: qty 5

## 2023-07-15 MED ORDER — LIDOCAINE HCL (PF) 2 % IJ SOLN
INTRAMUSCULAR | Status: AC
  Filled 2023-07-15: qty 5

## 2023-07-15 MED ORDER — DEXMEDETOMIDINE HCL IN NACL 80 MCG/20ML IV SOLN
INTRAVENOUS | Status: DC | PRN
Start: 2023-07-15 — End: 2023-07-15
  Administered 2023-07-15 (×3): 8 ug via INTRAVENOUS

## 2023-07-15 SURGICAL SUPPLY — 61 items
APPLICATOR COTTON TIP 6 STRL (MISCELLANEOUS) ×3 IMPLANT
APPLICATOR COTTON TIP 6IN STRL (MISCELLANEOUS) ×2 IMPLANT
APPLICATOR SURGIFLO ENDO (HEMOSTASIS) IMPLANT
BAG COUNTER SPONGE SURGICOUNT (BAG) IMPLANT
CATH FOLEY 2WAY SLVR 5CC 18FR (CATHETERS) ×3 IMPLANT
CATH ROBINSON RED A/P 16FR (CATHETERS) ×3 IMPLANT
CATH SILICONE 5CC 18FR (INSTRUMENTS) ×3 IMPLANT
CHLORAPREP W/TINT 26 (MISCELLANEOUS) ×3 IMPLANT
CLIP LIGATING HEM O LOK PURPLE (MISCELLANEOUS) ×3 IMPLANT
COVER SURGICAL LIGHT HANDLE (MISCELLANEOUS) ×3 IMPLANT
COVER TIP SHEARS 8 DVNC (MISCELLANEOUS) ×3 IMPLANT
CUTTER ECHEON FLEX ENDO 45 340 (ENDOMECHANICALS) IMPLANT
DERMABOND ADVANCED .7 DNX12 (GAUZE/BANDAGES/DRESSINGS) ×3 IMPLANT
DRAPE ARM DVNC X/XI (DISPOSABLE) ×12 IMPLANT
DRAPE COLUMN DVNC XI (DISPOSABLE) ×3 IMPLANT
DRAPE SURG IRRIG POUCH 19X23 (DRAPES) ×3 IMPLANT
DRIVER NDL LRG 8 DVNC XI (INSTRUMENTS) ×6 IMPLANT
DRIVER NDLE LRG 8 DVNC XI (INSTRUMENTS) ×4 IMPLANT
DRSG TEGADERM 4X4.75 (GAUZE/BANDAGES/DRESSINGS) IMPLANT
ELECT PENCIL ROCKER SW 15FT (MISCELLANEOUS) ×3 IMPLANT
ELECT REM PT RETURN 15FT ADLT (MISCELLANEOUS) ×3 IMPLANT
FORCEPS BPLR 8 MD DVNC XI (FORCEP) ×3 IMPLANT
FORCEPS BPLR FENES DVNC XI (FORCEP) ×3 IMPLANT
FORCEPS PROGRASP DVNC XI (FORCEP) ×3 IMPLANT
GAUZE 4X4 16PLY ~~LOC~~+RFID DBL (SPONGE) IMPLANT
GAUZE SPONGE 4X4 12PLY STRL (GAUZE/BANDAGES/DRESSINGS) ×3 IMPLANT
GLOVE BIO SURGEON STRL SZ 6.5 (GLOVE) ×3 IMPLANT
GLOVE BIOGEL M 7.0 STRL (GLOVE) ×6 IMPLANT
GLOVE BIOGEL PI IND STRL 7.5 (GLOVE) ×6 IMPLANT
GOWN STRL REUS W/ TWL XL LVL3 (GOWN DISPOSABLE) ×6 IMPLANT
GOWN STRL SURGICAL XL XLNG (GOWN DISPOSABLE) ×3 IMPLANT
HOLDER FOLEY CATH W/STRAP (MISCELLANEOUS) ×3 IMPLANT
IRRIGATION SUCT STRKRFLW 2 WTP (MISCELLANEOUS) ×3 IMPLANT
IV LACTATED RINGERS 1000ML (IV SOLUTION) ×3 IMPLANT
KIT TURNOVER KIT A (KITS) IMPLANT
MARKER SKIN DUAL TIP RULER LAB (MISCELLANEOUS) ×3 IMPLANT
NDL INSUFFLATION 14GA 120MM (NEEDLE) ×3 IMPLANT
NEEDLE INSUFFLATION 14GA 120MM (NEEDLE) ×2 IMPLANT
PACK ROBOT UROLOGY CUSTOM (CUSTOM PROCEDURE TRAY) ×3 IMPLANT
PLUG CATH AND CAP STRL 200 (CATHETERS) IMPLANT
PROTECTOR NERVE ULNAR (MISCELLANEOUS) ×3 IMPLANT
RELOAD STAPLE 45 4.1 GRN THCK (STAPLE) IMPLANT
SCISSORS MNPLR CVD DVNC XI (INSTRUMENTS) ×3 IMPLANT
SEAL UNIV 5-12 XI (MISCELLANEOUS) ×12 IMPLANT
SET CYSTO W/LG BORE CLAMP LF (SET/KITS/TRAYS/PACK) IMPLANT
SET TUBE SMOKE EVAC HIGH FLOW (TUBING) ×3 IMPLANT
SOL PREP POV-IOD 4OZ 10% (MISCELLANEOUS) ×3 IMPLANT
SOLUTION ELECTROSURG ANTI STCK (MISCELLANEOUS) ×3 IMPLANT
STAPLE RELOAD 45 GRN (STAPLE) IMPLANT
SURGIFLO W/THROMBIN 8M KIT (HEMOSTASIS) IMPLANT
SUT ETHILON 2 0 PS N (SUTURE) IMPLANT
SUT MNCRL 3 0 VIOLET RB1 (SUTURE) IMPLANT
SUT MNCRL AB 4-0 PS2 18 (SUTURE) ×6 IMPLANT
SUT PDS AB 0 CT1 36 (SUTURE) ×6 IMPLANT
SUT VIC AB 0 CT1 27XBRD ANTBC (SUTURE) ×3 IMPLANT
SUT VIC AB 2-0 SH 27XBRD (SUTURE) ×3 IMPLANT
SUT VIC AB 3-0 SH 27X BRD (SUTURE) IMPLANT
SUT VIC AB 4-0 RB1 27XBRD (SUTURE) IMPLANT
SUT VLOC 3-0 9IN GRN (SUTURE) IMPLANT
SUTURE VLOC BRB 180 ABS3/0GR12 (SUTURE) ×6 IMPLANT
WATER STERILE IRR 1000ML POUR (IV SOLUTION) ×3 IMPLANT
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
## 2023-07-15 NOTE — Anesthesia Postprocedure Evaluation (Signed)
 Anesthesia Post Note  Patient: Roberto Knapp  Procedure(s) Performed: PROSTATECTOMY, RADICAL, ROBOT-ASSISTED, LAPAROSCOPIC LYMPHADENECTOMY, PELVIS, ROBOT-ASSISTED (Bilateral)     Patient location during evaluation: PACU Anesthesia Type: General Level of consciousness: awake and alert and oriented Pain management: pain level controlled Vital Signs Assessment: post-procedure vital signs reviewed and stable Respiratory status: spontaneous breathing, nonlabored ventilation and respiratory function stable Cardiovascular status: blood pressure returned to baseline and stable Postop Assessment: no apparent nausea or vomiting Anesthetic complications: no   No notable events documented.  Last Vitals:  Vitals:   07/15/23 1515 07/15/23 1530  BP: (!) 145/95 (!) 146/93  Pulse: 64 76  Resp: 12 12  Temp:    SpO2: 100% 99%    Last Pain:  Vitals:   07/15/23 1530  TempSrc:   PainSc: Asleep                 Danisha Brassfield A.

## 2023-07-15 NOTE — Anesthesia Preprocedure Evaluation (Signed)
 Anesthesia Evaluation  Patient identified by MRN, date of birth, ID band Patient awake    Reviewed: Allergy & Precautions, NPO status , Patient's Chart, lab work & pertinent test results  Airway Mallampati: I  TM Distance: >3 FB     Dental  (+) Dental Advisory Given   Pulmonary shortness of breath and with exertion, sleep apnea , pneumonia, resolved, Current Smoker   Pulmonary exam normal breath sounds clear to auscultation       Cardiovascular negative cardio ROS Normal cardiovascular exam Rhythm:Regular Rate:Normal     Neuro/Psych  Headaches PSYCHIATRIC DISORDERS      ADHD   GI/Hepatic negative GI ROS, Neg liver ROS,,,  Endo/Other  negative endocrine ROS    Renal/GU Renal diseaseHx/o CKD   Prostate Ca    Musculoskeletal negative musculoskeletal ROS (+)    Abdominal   Peds  Hematology negative hematology ROS (+)   Anesthesia Other Findings   Reproductive/Obstetrics                              Anesthesia Physical Anesthesia Plan  ASA: 2  Anesthesia Plan: General   Post-op Pain Management: Minimal or no pain anticipated   Induction: Intravenous  PONV Risk Score and Plan: 3 and Treatment may vary due to age or medical condition and Midazolam   Airway Management Planned: Oral ETT  Additional Equipment: None  Intra-op Plan:   Post-operative Plan: Extubation in OR  Informed Consent: I have reviewed the patients History and Physical, chart, labs and discussed the procedure including the risks, benefits and alternatives for the proposed anesthesia with the patient or authorized representative who has indicated his/her understanding and acceptance.     Dental advisory given  Plan Discussed with: CRNA and Anesthesiologist  Anesthesia Plan Comments:          Anesthesia Quick Evaluation

## 2023-07-15 NOTE — Discharge Instructions (Signed)
   Activity:  You are encouraged to ambulate frequently (about every hour during waking hours) to help prevent blood clots from forming in your legs or lungs.  However, you should not engage in any heavy lifting (> 10-15 lbs), strenuous activity, or straining.   Diet: You should advance your diet as instructed by your physician.  It will be normal to have some bloating, nausea, and abdominal discomfort intermittently.   Prescriptions:  You will be provided a prescription for pain medication to take as needed.  If your pain is not severe enough to require the prescription pain medication, you may take extra strength Tylenol instead which will have less side effects.  You should also take a prescribed stool softener to avoid straining with bowel movements as the prescription pain medication may constipate you.   Incisions: You may remove your dressing bandages 48 hours after surgery if not removed in the hospital.  You will either have some small staples or special tissue glue at each of the incision sites. Once the bandages are removed (if present), the incisions may stay open to air.  You may start showering (but not soaking or bathing in water) the 2nd day after surgery and the incisions simply need to be patted dry after the shower.  No additional care is needed.   What to call us about: You should call the office [phone]) if you develop fever > 101 or develop persistent vomiting. Activity:  You are encouraged to ambulate frequently (about every hour during waking hours) to help prevent blood clots from forming in your legs or lungs.  However, you should not engage in any heavy lifting (> 10-15 lbs), strenuous activity, or straining.

## 2023-07-15 NOTE — Op Note (Signed)
 Operative Note  Preoperative diagnosis:  1.  Localized prostate cancer  Postoperative diagnosis: 1.  Localized prostate cancer  Procedure(s): 1.  Robotic assisted laparoscopic radical prostatectomy (right side unilateral nerve sparing) 2.  Robotic assisted laparoscopic bilateral pelvic lymph node dissection  Surgeon: Doy Gene, MD  Assistants:   Dr. Kristeen Peto, MD  An assistant was required for this surgical procedure.  The duties of the assistant included but were not limited to suctioning, passing suture, camera manipulation, retraction.  This procedure would not be able to be performed without an Geophysicist/field seismologist.   Anesthesia:  General  Complications:  None  EBL:  200l  Specimens: 1.  Prostate with seminal vesicles 2.  Periprostatic fat 3. Bilateral pelvic lymph nodes  Drains/Catheters: 1.  18 French Foley catheter  Intraoperative findings:   Approximately 25cc prostate.  No accessory pudendal vessels.  Successful right side nerve spare.  Excellent hemostasis.  Water-tight anastomosis without leak.  Indication:  Roberto Knapp is a 53 y.o. malewho initially presented with an elevated PSA.  Prostate biopsy showed Gleason 4+3 prostate cancer involving bilateral sides of the prostate.  Treatment options were discussed with him at length and he chose robotic assisted laparoscopic radical prostatectomy.  He has erectile dysfunction and the plan is for unilateral nerve sparing.  Bilateral pelvic lymph node dissection was planned due to his risk stratification.  Description of procedure: The indications, alternatives, benefits, and risks were discussed with the patient and informed symptoms obtained.  The patient was brought to the operating room table, positioned supine and secured to the bed with a safety strap.  All pressure points were carefully padded and pneumatic compression devices were placed on lower extremities.  After the administration of intravenous antibiotics and general  endotracheal anesthesia, the patient was repositioned in the dorsal lithotomy position using well-padded Allen stirrups.  The arms were carefully tucked at the patient's side and secured with padding.  The chest was secured in place with foam padding and cloth tape and the table was positioned in approximately 30 degree Trendelenburg.  A rectal exam showed the prostate to be 25cc, with a nodule on the left and not fixed. The patient's abdomen, genitalia, and upper thighs were prepped and draped in the standard sterile manner.  A time was completed, verifying the correct patient, surgical procedure and positioning prior to beginning the procedure.  An 92 French urethral catheter was inserted to drain the bladder.  Pneumoperitoneum was introduced by placing a Veress needle into the abdomen superior to the umbilicus and insufflated with CO2 to a pressure of 15 mmHg. An 8 mm blunt tip trocar was placed just above the umbilicus.  The 0 degree camera was then passed under direct visualization.  The abdominal cavity was examined for any sign of injury, adhesions, and identification of anatomic landmarks.  The remainder of the trochars were placed which included 2 separate 8 millimeter robotic trochars which were placed 9 cm laterally and inferiorly to the initially placed camera trocar.  A 12 mm trocar was placed 8 mm lateral to the right robotic trocar.  A separate 8 mm robotic trocar was placed 8 cm lateral to the previously placed left robotic trocar on the left side.  A 5 mm trocar was placed to the right and well above the umbilicus which approximately 10 and 12 cm away from the right-sided trochars.  The robot was then docked.  I placed monopolar scissors in the right hand, a fenestrated bipolar in the left hand  and a prograsp in the fourth arm.  The urachus and median umbilical ligament was divided and we developed the space of Retzius down to the pubic bone.  I divided the parietal peritoneum laterally up to  the vas deferens on each side.  Using the prograsp forcep to provide cranial traction on the urachus, the prostate was then defatted above the prostatic vesicle junction, and the superficial dorsal venous complex was coagulated with bipolar and divided.  We submitted the periprostatic fat for pathological specimen.  The endopelvic fascia was sharply opened bilaterally and the levator muscle fibers were swept posterior laterally allowing for visualization of the deep dorsal venous complex and apex of the prostate.  The puboprostatic ligaments were sharply divided and care was taken to preserve the dorsal venous complex.  I secured the dorsal venous complex with a battery operated stapler.  I identified the bladder neck by pulling a Foley catheter.  The fourth arm was applying cranial traction on the urachus.  I divided the anterior bladder neck musculature until I found the anterior bladder neck mucosa which was then incised. I identified the Foley catheter within, the balloon was deflated, we pulled the Foley catheter out into the operating field.  The assistant then used a grasper to apply traction to the Foley catheter and the surgical tech then placed a Henrietta on the catheter near the penis for optimal retraction.  I then divided the lateral bladder neck mucosa in the posterior bladder neck mucosa.  I was well away from the bilateral ureteral orifices.  I divided the posterior bladder neck musculature until I discovered the longitudinal fibers and kept dissecting until I found the vas deferens.  The bilateral vas deferens were then freed and divided.  I then freed the bilateral seminal vesicles using blunt and sharp dissection.  I placed metal clips on the seminal vesicle vessels and avoided cautery around the seminal vesicles.  I then switched back to a 0 degree lens.  I divided the Denonvilliers fascia beneath the prostate and developed the prostate off the rectum.  This plane was bluntly developed towards  the apex of the prostate.  I then addressed the pedicles, first starting with the right and then moving towards the left. I then did a right sided unilateral nerve spare by dividing the lateral pelvic fascia off of the prostatic capsule laterally.  I then isolated the pedicles of the prostate and placed Weck clips on the pedicles and then divided the pedicles with cold scissors.  I continued to divide the neurovascular bundles off of the prostate out to the apex of the prostate.  At this point the prostate was essentially freed up except for the urethra.   I then addressed the prostate anteriorly, dividing the dorsal vein with cautery.  The anterior urethra was then sharply divided with cold scissors.  The Foley catheter was then pulled back and we divided the posterior urethral wall. The specimen was then placed in a Endo Catch bag and then the bag was placed in the upper abdomen out of the way.  I then irrigated the pelvis.  We performed a rectal test by instilling air into the rectal Foley.  The test was negative.  There is no concern for rectal injury.  I then over sewed a few bleeders alongside the pedicles with a 4-0 Vicryl suture on an RB1 needle.  I then performed a bilateral pelvic lymph node dissection by incising the fascia overlying the right external iliac vein, dissecting distally.  I went just distal to the node of Cloquet were replaced clips and then divided the lymphatics.  The lateral aspect of the dissection was the pelvic sidewall, inferior was the obturator nerve and proximal of the hypogastric vessels.  I placed clips at the proximal aspect and then divided the lymphatics.  The specimen was removed with the scope grasper and sent to pathology.  Grossly, there were no enlarged lymph nodes.  This was performed on both the right and left pelvic lymph nodes.  With good hemostasis confirmed, I then did the posterior reconstruction with a Rocco stitch.  I used a 3-0 Vloc double-armed suture on  an RB1 needle.  I passed the sutures through the cut edges of Denonvilliers fascia beneath the bladder on the right side and through the posterior serrated sphincter underneath the urethra.  I ran this from right to left.  And then took the other end of the suture passing just proximal to the posterior bladder neck in the midline into the posterior serrated sphincter and around this from right to left using 3 throws and reapproximated the sutures.  I then completed the urethrovesical anastomosis using a 3-0 Vloc suture on an RB1 needle.  I passed both ends of the suture from the outside in through the bladder neck at the 6 o'clock position.  I passed both through the urethral stump from the inside out and the corresponding position.  I reapproximated the bladder neck to the urethra.  I then ran the left suture on the left side anastomosis to the 9 o'clock position.  And then went back to the right-sided suture around that up the right side of the 12 o'clock position.  I then continued the left suture to the 12 o'clock position. I identified the ureteral orifices and ensured that these were not incorporated with the sutures.  I then placed a new 18 French Foley catheter into the bladder and filled it with 10 cc of sterile water.  I then secured the knot and then passed the suture behind the pubic bone for anterior suspension.  The bladder was irrigated with 200 cc of water.  There was no leak.  Hemostasis was excellent.  A 15 French drain was placed through the previously placed fourth arm.  We did place a Carter-Thomason 0 vicryl suture through the 12 mm assistant port. The robot was undocked and all the trochars were removed under direct vision.    I enlarged the umbilical trocar site large enough to remove the prostate and closed the fascia with 0 PDS sutures in a running fashion.  All the port sites were irrigated.  Exparel  was injected to the trocar sites.  The skin was closed with 4-0 Monocryl in a  running subcuticular fashion.  Skin glue was applied.  At this point, the patient was extubated and awakened in the operating room and taken to recovery room in stable condition.  There were no immediate complications.  All counts were correct.  Plan: Admit for observation overnight.  Clear liquids tonight.  Regular for breakfast tomorrow.  Anticipate discharge home tomorrow.  Matt R. Moe Brier MD [facility]  Pager: [phone]

## 2023-07-15 NOTE — Transfer of Care (Signed)
 Immediate Anesthesia Transfer of Care Note  Patient: Roberto Knapp  Procedure(s) Performed: PROSTATECTOMY, RADICAL, ROBOT-ASSISTED, LAPAROSCOPIC LYMPHADENECTOMY, PELVIS, ROBOT-ASSISTED (Bilateral)  Patient Location: PACU  Anesthesia Type:General  Level of Consciousness: sedated  Airway & Oxygen Therapy: Patient Spontanous Breathing and Patient connected to face mask oxygen  Post-op Assessment: Report given to RN and Post -op Vital signs reviewed and stable  Post vital signs: Reviewed and stable  Last Vitals:  Vitals Value Taken Time  BP 152/91 07/15/23 1454  Temp    Pulse 75 07/15/23 1456  Resp 19 07/15/23 1456  SpO2 100 % 07/15/23 1456  Vitals shown include unfiled device data.  Last Pain:  Vitals:   07/15/23 0906  TempSrc: Oral         Complications: No notable events documented.

## 2023-07-15 NOTE — Anesthesia Procedure Notes (Signed)
 Procedure Name: Intubation Date/Time: 07/15/2023 11:15 AM  Performed by: Judd Northern, RNPre-anesthesia Checklist: Patient identified, Emergency Drugs available, Suction available, Patient being monitored and Timeout performed Patient Re-evaluated:Patient Re-evaluated prior to induction Oxygen Delivery Method: Circle system utilized Preoxygenation: Pre-oxygenation with 100% oxygen Induction Type: IV induction Ventilation: Mask ventilation without difficulty Laryngoscope Size: Mac and 3 Grade View: Grade I Tube type: Oral Tube size: 7.0 mm Number of attempts: 1 Airway Equipment and Method: Stylet Placement Confirmation: ETT inserted through vocal cords under direct vision, positive ETCO2, CO2 detector and breath sounds checked- equal and bilateral Secured at: 22 cm Tube secured with: Tape Dental Injury: Teeth and Oropharynx as per pre-operative assessment

## 2023-07-15 NOTE — Progress Notes (Signed)
 Patient stated he did not wear CPAP at night.

## 2023-07-16 ENCOUNTER — Encounter (HOSPITAL_COMMUNITY): Payer: Self-pay | Admitting: Urology

## 2023-07-16 DIAGNOSIS — F17210 Nicotine dependence, cigarettes, uncomplicated: Secondary | ICD-10-CM | POA: Diagnosis not present

## 2023-07-16 DIAGNOSIS — F1290 Cannabis use, unspecified, uncomplicated: Secondary | ICD-10-CM | POA: Diagnosis not present

## 2023-07-16 DIAGNOSIS — N529 Male erectile dysfunction, unspecified: Secondary | ICD-10-CM | POA: Diagnosis not present

## 2023-07-16 DIAGNOSIS — N189 Chronic kidney disease, unspecified: Secondary | ICD-10-CM | POA: Diagnosis not present

## 2023-07-16 DIAGNOSIS — G4733 Obstructive sleep apnea (adult) (pediatric): Secondary | ICD-10-CM | POA: Diagnosis not present

## 2023-07-16 DIAGNOSIS — C61 Malignant neoplasm of prostate: Secondary | ICD-10-CM | POA: Diagnosis not present

## 2023-07-16 DIAGNOSIS — I4891 Unspecified atrial fibrillation: Secondary | ICD-10-CM | POA: Diagnosis not present

## 2023-07-16 LAB — BASIC METABOLIC PANEL WITH GFR
Anion gap: 6 (ref 5–15)
BUN: 10 mg/dL (ref 6–20)
CO2: 24 mmol/L (ref 22–32)
Calcium: 8.4 mg/dL — ABNORMAL LOW (ref 8.9–10.3)
Chloride: 105 mmol/L (ref 98–111)
Creatinine, Ser: 0.87 mg/dL (ref 0.61–1.24)
GFR, Estimated: >60 mL/min (ref >60)
Glucose, Bld: 120 mg/dL — ABNORMAL HIGH (ref 70–99)
Potassium: 4.2 mmol/L (ref 3.5–5.1)
Sodium: 135 mmol/L (ref 135–145)

## 2023-07-16 LAB — CBC
HCT: 43.2 % (ref 39.0–52.0)
Hemoglobin: 14.1 g/dL (ref 13.0–17.0)
MCH: 31.3 pg (ref 26.0–34.0)
MCHC: 32.6 g/dL (ref 30.0–36.0)
MCV: 96 fL (ref 80.0–100.0)
Platelets: 285 10*3/uL (ref 150–400)
RBC: 4.5 MIL/uL (ref 4.22–5.81)
RDW: 12.3 % (ref 11.5–15.5)
WBC: 9.3 10*3/uL (ref 4.0–10.5)
nRBC: 0 % (ref 0.0–0.2)

## 2023-07-16 LAB — CREATININE, FLUID (PLEURAL, PERITONEAL, JP DRAINAGE): Creat, Fluid: 0.8 mg/dL

## 2023-07-16 MED ORDER — LISDEXAMFETAMINE DIMESYLATE 70 MG PO CAPS
70.0000 mg | ORAL_CAPSULE | Freq: Every day | ORAL | Status: DC
  Administered 2023-07-16: 70 mg via ORAL
  Filled 2023-07-16: qty 1

## 2023-07-16 MED ORDER — ORAL CARE MOUTH RINSE: 15.0000 mL | OROMUCOSAL | Status: DC | PRN

## 2023-07-16 NOTE — Progress Notes (Signed)
 Pt ambulated 22 ft last night, and 125 ft this morning. Tolerated fairly.

## 2023-07-16 NOTE — Discharge Summary (Signed)
 Date of admission: 07/15/2023  Date of discharge: 07/16/2023  Admission diagnosis: Prostate cancer  Discharge diagnosis: Prostate cancer  Secondary diagnoses: none  History and Physical: For full details, please see admission history and physical. Briefly, Roberto Knapp is a 53 y.o. year old patient with prostate cancer here for RALP with b/l PLND.   Hospital Course: The patient recovered in the usual expected fashion.  He had his diet advanced slowly.  Initially managed with IV pain control, then transitioned to PO meds when he was tolerating oral intake.  His labs were stable throughout the hospital course.  He was discharged to home on POD#1.  At the time of discharge the patient was tolerating a regular diet, passing flatus, ambulating, had adequate pain control and was agreeable to discharge.  Follow up as scheduled.    Laboratory values:  Recent Labs    07/15/23 1517 07/16/23 0459  HGB 14.6 14.1  HCT 46.0 43.2   Recent Labs    07/15/23 1517 07/16/23 0459  CREATININE 0.93 0.87    Disposition: Home  Discharge instruction: The patient was instructed to be ambulatory but told to refrain from heavy lifting, strenuous activity, or driving.   Discharge medications:  Allergies as of 07/16/2023   No Known Allergies      Medication List     TAKE these medications    amphetamine-dextroamphetamine 10 MG tablet Commonly known as: ADDERALL Take 10 mg by mouth daily.   aspirin  EC 81 MG tablet Take 1 tablet (81 mg total) by mouth daily. Swallow whole.   atorvastatin 10 MG tablet Commonly known as: LIPITOR Take 10 mg by mouth daily.   calcium carbonate 500 MG chewable tablet Commonly known as: TUMS - dosed in mg elemental calcium Chew 1 tablet by mouth daily as needed for indigestion or heartburn.   docusate sodium  100 MG capsule Commonly known as: Colace Take 1 capsule (100 mg total) by mouth daily as needed for up to 30 doses.   loratadine 10 MG tablet Commonly  known as: CLARITIN Take 10 mg by mouth daily as needed for allergies.   meclizine  50 MG tablet Commonly known as: ANTIVERT  Take 1 tablet (50 mg total) by mouth 3 (three) times daily as needed.   oxyCODONE -acetaminophen  5-325 MG tablet Commonly known as: Percocet Take 1 tablet by mouth every 4 (four) hours as needed for up to 30 doses for severe pain (pain score 7-10).   sildenafil 100 MG tablet Commonly known as: VIAGRA Take 100 mg by mouth as needed for erectile dysfunction.   SUMAtriptan  100 MG tablet Commonly known as: IMITREX  Take 1 tablet (100 mg total) by mouth once as needed for up to 1 dose for migraine. May repeat in 2 hours if headache persists or recurs.   topiramate  25 MG tablet Commonly known as: TOPAMAX  Take 3 tablets (75 mg total) by mouth at bedtime. What changed:  when to take this reasons to take this   Vyvanse 70 MG capsule Generic drug: lisdexamfetamine Take 70 mg by mouth every morning.        Followup:   Follow-up Information     Lahoma Pigg, MD Follow up on 07/23/2023.   Specialty: Urology Why: Judene Noss information: [address] [place] [address] [phone]                 Euel Herring. Rekita Miotke MD [facility]  Pager: [phone]

## 2023-07-16 NOTE — TOC Transition Note (Signed)
 Transition of Care [facility]) - Discharge Note   Patient Details  Name: Roberto Knapp MRN: [id number] Date of Birth: Mar 22, 1970  [facility] CM/SW Contact:  Ruben Corolla, RN Phone Number: 07/16/2023, 1:12 PM   Clinical Narrative: d/c home no CM needs.      Final next level of care: Home/Self Care Barriers to Discharge: No Barriers Identified   Patient Goals and CMS Choice            Discharge Placement                       Discharge Plan and Services Additional resources added to the After Visit Summary for                                       Social Drivers of Health (SDOH) Interventions SDOH Screenings   Food Insecurity: No Food Insecurity (07/16/2023)  Housing: Low Risk  (07/16/2023)  Transportation Needs: No Transportation Needs (07/16/2023)  Utilities: Not At Risk (07/16/2023)  Tobacco Use: High Risk (07/15/2023)     Readmission Risk Interventions     No data to display

## 2023-07-16 NOTE — Progress Notes (Signed)
 1 Day Post-Op Subjective: Pain controlled. No nausea or emesis. No flatus or BM. Tolerating clears.   Objective: Vital signs in last 24 hours: Temp:  [96.8 F (36 C)-98.1 F (36.7 C)] 98 F (36.7 C) (05/13 1610) Pulse Rate:  [62-85] 63 (05/13 0613) Resp:  [10-19] 17 (05/13 0613) BP: (113-152)/(71-97) 113/71 (05/13 0613) SpO2:  [93 %-100 %] 100 % (05/13 9604) Weight:  [69.9 kg] 69.9 kg (05/12 0904)  Intake/Output from previous day: 05/12 0701 - 05/13 0700 In: 1534.6 [I.V.:1434.6; IV Piggyback:100] Out: 1070 [Urine:800; Drains:30; Blood:240] Intake/Output this shift: No intake/output data recorded. UOP: clear yellow JP: 30ml ss  Physical Exam:  General: Alert and oriented CV: RRR Lungs: Clear Abdomen: Soft, ND, ATTP; inc c/d/I Ext: NT, No erythema  Lab Results: Recent Labs    07/15/23 1517 07/16/23 0459  HGB 14.6 14.1  HCT 46.0 43.2   BMET Recent Labs    07/15/23 1517 07/16/23 0459  NA 136 135  K 4.8 4.2  CL 109 105  CO2 19* 24  GLUCOSE 119* 120*  BUN 12 10  CREATININE 0.93 0.87  CALCIUM 8.5* 8.4*     Studies/Results: No results found.  Assessment/Plan: Prostate cancer s/p RALP with b/l PLND  -Pain control prn -Clears -Saline lock -Check JP creatinine and will remove prior to discharge -OOB, amb, IS -Anticipate discharge home today   LOS: 0 days   Matt R. Kethan Papadopoulos MD 07/16/2023, 7:50 AM [facility]  Pager: [phone]

## 2023-07-18 LAB — SURGICAL PATHOLOGY

## 2023-09-03 DIAGNOSIS — C61 Malignant neoplasm of prostate: Secondary | ICD-10-CM | POA: Diagnosis not present

## 2023-10-16 DIAGNOSIS — C61 Malignant neoplasm of prostate: Secondary | ICD-10-CM | POA: Diagnosis not present

## 2023-10-23 DIAGNOSIS — C61 Malignant neoplasm of prostate: Secondary | ICD-10-CM | POA: Diagnosis not present

## 2023-10-23 DIAGNOSIS — N5201 Erectile dysfunction due to arterial insufficiency: Secondary | ICD-10-CM | POA: Diagnosis not present

## 2023-10-29 ENCOUNTER — Telehealth: Payer: Self-pay | Admitting: Radiation Oncology

## 2023-10-29 ENCOUNTER — Encounter: Payer: Self-pay | Admitting: Urology

## 2023-10-29 DIAGNOSIS — Z0000 Encounter for general adult medical examination without abnormal findings: Secondary | ICD-10-CM | POA: Diagnosis not present

## 2023-10-29 DIAGNOSIS — C61 Malignant neoplasm of prostate: Secondary | ICD-10-CM | POA: Diagnosis not present

## 2023-10-29 DIAGNOSIS — M25511 Pain in right shoulder: Secondary | ICD-10-CM | POA: Diagnosis not present

## 2023-10-29 DIAGNOSIS — E785 Hyperlipidemia, unspecified: Secondary | ICD-10-CM | POA: Diagnosis not present

## 2023-10-29 DIAGNOSIS — G43909 Migraine, unspecified, not intractable, without status migrainosus: Secondary | ICD-10-CM | POA: Diagnosis not present

## 2023-10-29 DIAGNOSIS — F4321 Adjustment disorder with depressed mood: Secondary | ICD-10-CM | POA: Diagnosis not present

## 2023-10-29 DIAGNOSIS — F902 Attention-deficit hyperactivity disorder, combined type: Secondary | ICD-10-CM | POA: Diagnosis not present

## 2023-10-29 DIAGNOSIS — Z720 Tobacco use: Secondary | ICD-10-CM | POA: Diagnosis not present

## 2023-10-29 NOTE — Telephone Encounter (Signed)
 Left message for patient to call back to schedule consult per 8/20 referral.

## 2023-10-30 ENCOUNTER — Telehealth: Payer: Self-pay | Admitting: Radiation Oncology

## 2023-10-30 NOTE — Telephone Encounter (Signed)
 Left message for patient to call back to schedule consult per 8/20 referral.

## 2023-10-31 ENCOUNTER — Telehealth: Payer: Self-pay | Admitting: Radiation Oncology
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
## 2023-10-31 NOTE — Telephone Encounter (Signed)
 Called patient to schedule consult per 8/20 referral. Patient answered the phone, I am busy. Do not call me again and hung up. Will mail letter for patient to call back instead.

## 2023-11-11 NOTE — Progress Notes (Signed)
 Radiation Oncology         [phone] ________________________________  Initial Outpatient Consultation - Conducted via telephone  Name: Roberto Knapp MRN: [id number]  Date: 11/12/2023  DOB: Dec 22, 1970  RR:Azrxzm, Jenkins Salt, MD  Selma Donnice SAUNDERS, MD   REFERRING PHYSICIAN: Selma Donnice SAUNDERS, MD  DIAGNOSIS: 53 y.o. gentleman with high risk features on RALP 07/2023 for Stage pT3bN0, Gleason 4+3 prostate cancer  No diagnosis found.  HISTORY OF PRESENT ILLNESS: Roberto Knapp is a 53 y.o. male with a diagnosis of {biochemically recurrent} prostate cancer. He was initially referred to Dr. Selma for an elevated PSA of 6.44. He was subsequently diagnosed with Gleason 4+3 prostate cancer on 03/18/23. Staging PSMA PET scan on 04/10/23 showed no evidence of disease outside of the prostate. He opted to proceed with RALP on 07/15/23 under Dr. Selma. Pathology revealed Gleason 4+3 prostatic adenocarcinoma with multifocal extraprostatic extension at left lateral mid and base, positive margin at left base lateral, perineural invasion, and left seminal vesicle involvement. All four biopsied lymph nodes were negative. His postoperative PSA was barely detectable at 0.035.  The patient reviewed the pathology and PSA results with his urologist and he has kindly been referred today for discussion of potential radiation treatment options.   PREVIOUS RADIATION THERAPY: No  PAST MEDICAL HISTORY:  Past Medical History:  Diagnosis Date   ADHD (attention deficit hyperactivity disorder)    Cancer (HCC)    Chronic kidney disease    Drug overdose, intentional (HCC)    Hemiplegic migraine 08/10/2020   Sleep apnea       PAST SURGICAL HISTORY: Past Surgical History:  Procedure Laterality Date   FASCIOTOMY  09/08/2011   Procedure: FASCIOTOMY;  Surgeon: Carlin FORBES Haddock, MD;  Location: [facility] OR;  Service: Vascular;  Laterality: Left;   FEMORAL-POPLITEAL BYPASS GRAFT  09/08/2011   Procedure: BYPASS GRAFT FEMORAL-POPLITEAL ARTERY;   Surgeon: Carlin FORBES Haddock, MD;  Location: [facility] OR;  Service: Vascular;  Laterality: Left;   NO PAST SURGERIES     ROBOT ASSISTED LAPAROSCOPIC RADICAL PROSTATECTOMY N/A 07/15/2023   Procedure: PROSTATECTOMY, RADICAL, ROBOT-ASSISTED, LAPAROSCOPIC;  Surgeon: Selma Donnice SAUNDERS, MD;  Location: [facility] ORS;  Service: Urology;  Laterality: N/A;    FAMILY HISTORY:  Family History  Problem Relation Age of Onset   Hypertension Father     SOCIAL HISTORY:  Social History   Socioeconomic History   Marital status: Married    Spouse name: Josie   Number of children: Not on file   Years of education: Not on file   Highest education level: Not on file  Occupational History   Occupation: full time  Tobacco Use   Smoking status: Every Day    Current packs/day: 0.50    Average packs/day: 0.5 packs/day for 25.0 years (12.5 ttl pk-yrs)    Types: Cigarettes   Smokeless tobacco: Never   Tobacco comments:    pt states that he is trying to quit and has cut back  Vaping Use   Vaping status: Never Used  Substance and Sexual Activity   Alcohol use: Yes    Alcohol/week: 3.0 - 4.0 standard drinks of alcohol    Types: 3 - 4 Cans of beer per week    Comment: 3-4 per day   Drug use: Yes    Types: Marijuana   Sexual activity: Not on file  Other Topics Concern   Not on file  Social History Narrative   Lives with wife   Right handed  Drinks > 10 cups caffeine daily       Social Drivers of Corporate investment banker Strain: Not on file  Food Insecurity: No Food Insecurity (07/16/2023)   Hunger Vital Sign    Worried About Running Out of Food in the Last Year: Never true    Ran Out of Food in the Last Year: Never true  Transportation Needs: No Transportation Needs (07/16/2023)   PRAPARE - Administrator, Civil Service (Medical): No    Lack of Transportation (Non-Medical): No  Physical Activity: Not on file  Stress: Not on file  Social Connections: Not on file  Intimate Partner Violence: Not At  Risk (07/16/2023)   Humiliation, Afraid, Rape, and Kick questionnaire    Fear of Current or Ex-Partner: No    Emotionally Abused: No    Physically Abused: No    Sexually Abused: No    ALLERGIES: Patient has no known allergies.  MEDICATIONS:  Current Outpatient Medications  Medication Sig Dispense Refill   amphetamine -dextroamphetamine  (ADDERALL) 10 MG tablet Take 10 mg by mouth daily.     aspirin  EC 81 MG tablet Take 1 tablet (81 mg total) by mouth daily. Swallow whole. 30 tablet 11   atorvastatin  (LIPITOR) 10 MG tablet Take 10 mg by mouth daily.     calcium  carbonate (TUMS - DOSED IN MG ELEMENTAL CALCIUM ) 500 MG chewable tablet Chew 1 tablet by mouth daily as needed for indigestion or heartburn.     docusate sodium  (COLACE) 100 MG capsule Take 1 capsule (100 mg total) by mouth daily as needed for up to 30 doses. 30 capsule 0   loratadine  (CLARITIN ) 10 MG tablet Take 10 mg by mouth daily as needed for allergies.     meclizine  (ANTIVERT ) 50 MG tablet Take 1 tablet (50 mg total) by mouth 3 (three) times daily as needed. 30 tablet 0   oxyCODONE -acetaminophen  (PERCOCET) 5-325 MG tablet Take 1 tablet by mouth every 4 (four) hours as needed for up to 30 doses for severe pain (pain score 7-10). 30 tablet 0   sildenafil (VIAGRA) 100 MG tablet Take 100 mg by mouth as needed for erectile dysfunction.     SUMAtriptan  (IMITREX ) 100 MG tablet Take 1 tablet (100 mg total) by mouth once as needed for up to 1 dose for migraine. May repeat in 2 hours if headache persists or recurs. 10 tablet 11   topiramate  (TOPAMAX ) 25 MG tablet Take 3 tablets (75 mg total) by mouth at bedtime. (Patient taking differently: Take 75 mg by mouth daily as needed (nerve pain).) 270 tablet 3   VYVANSE  70 MG capsule Take 70 mg by mouth every morning.     No current facility-administered medications for this encounter.    REVIEW OF SYSTEMS:  On review of systems, the patient reports that he is doing well overall. He denies any  chest pain, shortness of breath, cough, fevers, chills, night sweats, unintended weight changes. He denies any bowel disturbances, and denies abdominal pain, nausea or vomiting. He denies any new musculoskeletal or joint aches or pains. His IPSS was ***, indicating *** urinary symptoms. His SHIM was ***, indicating he {does not have/likely has postoperative} erectile dysfunction. A complete review of systems is obtained and is otherwise negative.    PHYSICAL EXAM:  Wt Readings from Last 3 Encounters:  07/15/23 154 lb (69.9 kg)  07/04/23 154 lb (69.9 kg)  02/06/23 160 lb (72.6 kg)   Temp Readings from Last 3 Encounters:  07/16/23  98 F (36.7 C) (Oral)  07/04/23 98.3 F (36.8 C) (Oral)  06/10/23 97.8 F (36.6 C)   BP Readings from Last 3 Encounters:  07/16/23 113/71  07/04/23 (!) 112/90  06/10/23 99/79   Pulse Readings from Last 3 Encounters:  07/16/23 63  07/04/23 78  06/10/23 62    /10  In general this is a well appearing *** man in no acute distress. He's alert and oriented x4 and appropriate throughout the examination. Cardiopulmonary assessment is negative for acute distress, and he exhibits normal effort.     KPS = ***  100 - Normal; no complaints; no evidence of disease. 90   - Able to carry on normal activity; minor signs or symptoms of disease. 80   - Normal activity with effort; some signs or symptoms of disease. 45   - Cares for self; unable to carry on normal activity or to do active work. 60   - Requires occasional assistance, but is able to care for most of his personal needs. 50   - Requires considerable assistance and frequent medical care. 40   - Disabled; requires special care and assistance. 30   - Severely disabled; hospital admission is indicated although death not imminent. 20   - Very sick; hospital admission necessary; active supportive treatment necessary. 10   - Moribund; fatal processes progressing rapidly. 0     - Dead  Karnofsky DA, Abelmann  WH, Craver LS and Burchenal [facility] [phone]) The use of the nitrogen mustards in the palliative treatment of carcinoma: with particular reference to bronchogenic carcinoma Cancer 1 634-56  LABORATORY DATA:  Lab Results  Component Value Date   WBC 9.3 07/16/2023   HGB 14.1 07/16/2023   HCT 43.2 07/16/2023   MCV 96.0 07/16/2023   PLT 285 07/16/2023   Lab Results  Component Value Date   NA 135 07/16/2023   K 4.2 07/16/2023   CL 105 07/16/2023   CO2 24 07/16/2023   Lab Results  Component Value Date   ALT 26 07/25/2020   AST 25 07/25/2020   ALKPHOS 73 07/25/2020   BILITOT 0.7 07/25/2020     RADIOGRAPHY: No results found.    IMPRESSION/PLAN: 1. 53 y.o. gentleman with high risk features on RALP 07/2023 for Stage pT3bN0, Gleason 4+3 prostate cancer Today I reviewed the findings and workup thus far.  We discussed the natural history of prostate cancer.  We reviewed the the implications of positive margins, extracapsular extension, and seminal vesicle involvement on the risk of prostate cancer recurrence. In his case, extraprostatic extension, a positive margin, and left seminal vesicle invasion were present. We reviewed some of the evidence suggesting an advantage for patients who undergo adjuvant radiotherapy in the setting in terms of disease control and overall survival. We discussed radiation treatment directed to the prostatic fossa with regard to the logistics and delivery of external beam radiation treatment.  At the conclusion of our conversation, the patient is interested in moving forward with 7.5 weeks of adjuvant external beam therapy. We will share our discussion with Dr. Selma. The patient appears to have a good understanding of his disease and our treatment recommendations which are of curative intent and is in agreement with the stated plan.  Therefore, we will move forward with treatment planning accordingly, in anticipation of beginning IMRT in the near future.   Given current  concerns for patient exposure during the COVID-19 pandemic, this encounter was conducted via telephone. The patient was notified in advance and was  offered a WebEX meeting to allow for face to face communication but unfortunately reported that he did not have the appropriate resources/technology to support such a visit and instead preferred to proceed with telephone consult. The patient has given verbal consent for this type of encounter. The time spent during this encounter was *** minutes. The attendants for this meeting include Donnice Barge MD, Zohaib Heeney PA-C, patient Arley ORN [place] {and ***.} During the encounter, Donnice Barge MD and Sabra Rusk PA-C were located at [facility] Radiation Oncology Department.  Patient Kalijah Zeiss Drab {and *** were} was located at home.     Sabra MICAEL Rusk, PA-C    Donnice Barge, MD  [facility]  Radiation Oncology Direct Dial: [phone]  Fax: [phone] [email or web address]  Skype  LinkedIn   This document serves as a record of services personally performed by Donnice Barge, MD and Sabra Rusk, PA-C. It was created on their behalf by Izetta Neither, a trained medical scribe. The creation of this record is based on the scribe's personal observations and the provider's statements to them. This document has been checked and approved by the attending provider.

## 2023-11-12 ENCOUNTER — Ambulatory Visit
Admission: RE | Admit: 2023-11-12 | Discharge: 2023-11-12 | Disposition: A | Discharge status: Home / Self Care | Source: Ambulatory Visit | Attending: Radiation Oncology | Admitting: Radiation Oncology

## 2023-11-12 ENCOUNTER — Encounter: Payer: Self-pay | Admitting: Radiation Oncology

## 2023-11-12 DIAGNOSIS — J439 Emphysema, unspecified: Secondary | ICD-10-CM | POA: Insufficient documentation

## 2023-11-12 DIAGNOSIS — C61 Malignant neoplasm of prostate: Secondary | ICD-10-CM

## 2023-11-12 DIAGNOSIS — G2581 Restless legs syndrome: Secondary | ICD-10-CM | POA: Insufficient documentation

## 2023-11-12 DIAGNOSIS — F17200 Nicotine dependence, unspecified, uncomplicated: Secondary | ICD-10-CM | POA: Insufficient documentation

## 2023-11-12 DIAGNOSIS — G8929 Other chronic pain: Secondary | ICD-10-CM | POA: Insufficient documentation

## 2023-11-12 DIAGNOSIS — F1010 Alcohol abuse, uncomplicated: Secondary | ICD-10-CM | POA: Insufficient documentation

## 2023-11-12 DIAGNOSIS — M5430 Sciatica, unspecified side: Secondary | ICD-10-CM | POA: Insufficient documentation

## 2023-11-12 DIAGNOSIS — Z789 Other specified health status: Secondary | ICD-10-CM | POA: Insufficient documentation

## 2023-11-12 DIAGNOSIS — G939 Disorder of brain, unspecified: Secondary | ICD-10-CM | POA: Insufficient documentation

## 2023-11-12 DIAGNOSIS — F909 Attention-deficit hyperactivity disorder, unspecified type: Secondary | ICD-10-CM | POA: Insufficient documentation

## 2023-11-12 DIAGNOSIS — G4733 Obstructive sleep apnea (adult) (pediatric): Secondary | ICD-10-CM | POA: Insufficient documentation

## 2023-11-12 DIAGNOSIS — Z191 Hormone sensitive malignancy status: Secondary | ICD-10-CM | POA: Diagnosis not present

## 2023-11-12 DIAGNOSIS — E785 Hyperlipidemia, unspecified: Secondary | ICD-10-CM | POA: Insufficient documentation

## 2023-11-12 DIAGNOSIS — R400 Somnolence: Secondary | ICD-10-CM | POA: Insufficient documentation

## 2023-11-12 DIAGNOSIS — M75111 Incomplete rotator cuff tear or rupture of right shoulder, not specified as traumatic: Secondary | ICD-10-CM | POA: Insufficient documentation

## 2023-11-12 DIAGNOSIS — G43909 Migraine, unspecified, not intractable, without status migrainosus: Secondary | ICD-10-CM | POA: Insufficient documentation

## 2023-11-12 DIAGNOSIS — I4891 Unspecified atrial fibrillation: Secondary | ICD-10-CM | POA: Insufficient documentation

## 2023-11-12 DIAGNOSIS — R911 Solitary pulmonary nodule: Secondary | ICD-10-CM | POA: Insufficient documentation

## 2023-11-12 DIAGNOSIS — I679 Cerebrovascular disease, unspecified: Secondary | ICD-10-CM | POA: Insufficient documentation

## 2023-11-12 DIAGNOSIS — Z720 Tobacco use: Secondary | ICD-10-CM | POA: Insufficient documentation

## 2023-11-12 HISTORY — DX: Male erectile dysfunction, unspecified: N52.9

## 2023-11-12 NOTE — Progress Notes (Signed)
 Introduced myself to the patient as the prostate nurse navigator.  No barriers to care identified at this time.  He is here to discuss his radiation treatment options.  I gave him my business card and asked him to call me with questions or concerns.  Verbalized understanding.  ?

## 2023-11-14 DIAGNOSIS — Z191 Hormone sensitive malignancy status: Secondary | ICD-10-CM | POA: Diagnosis not present

## 2023-11-14 DIAGNOSIS — C61 Malignant neoplasm of prostate: Secondary | ICD-10-CM | POA: Diagnosis not present

## 2023-11-22 ENCOUNTER — Ambulatory Visit
Admission: RE | Admit: 2023-11-22 | Discharge: 2023-11-22 | Disposition: A | Discharge status: Home / Self Care | Source: Ambulatory Visit | Attending: Radiation Oncology | Admitting: Radiation Oncology

## 2023-11-22 DIAGNOSIS — C61 Malignant neoplasm of prostate: Secondary | ICD-10-CM | POA: Diagnosis not present

## 2023-11-22 DIAGNOSIS — Z191 Hormone sensitive malignancy status: Secondary | ICD-10-CM | POA: Diagnosis not present

## 2023-11-24 NOTE — Progress Notes (Signed)
  Radiation Oncology         (336) [phone] ________________________________  Name: Roberto Knapp MRN: [id number]  Date: 11/22/2023  DOB: [birth year]  SIMULATION AND TREATMENT PLANNING NOTE    ICD-10-CM   1. Prostate cancer [facility])  C61       DIAGNOSIS:   53 y.o. gentleman with adverse pathology s/p RALP 07/2023 for Stage pT3bN0, Gleason 4+3 prostate cancer  NARRATIVE:  The patient was brought to the CT Simulation planning suite.  Identity was confirmed.  All relevant records and images related to the planned course of therapy were reviewed.  The patient freely provided informed written consent to proceed with treatment after reviewing the details related to the planned course of therapy. The consent form was witnessed and verified by the simulation staff.  Then, the patient was set-up in a stable reproducible supine position for radiation therapy.  A vacuum lock pillow device was custom fabricated to position his legs in a reproducible immobilized position.  Then, I performed a urethrogram under sterile conditions to identify the prostatic bed.  CT images were obtained.  Surface markings were placed.  The CT images were loaded into the planning software.  Then the prostate bed target, pelvic lymph node target and avoidance structures including the rectum, bladder, bowel and hips were contoured.  Treatment planning then occurred.  The radiation prescription was entered and confirmed.  A total of one complex treatment devices were fabricated. I have requested : Intensity Modulated Radiotherapy (IMRT) is medically necessary for this case for the following reason:  Rectal sparing.SABRA  PLAN:  The patient will receive 45 Gy in 25 fractions of 1.8 Gy, followed by a boost to the prostate bed to a total dose of 68.4 Gy with 13 additional fractions of 1.8 Gy.   ________________________________  Donnice FELIX Patrcia, M.D.

## 2023-11-25 DIAGNOSIS — Z191 Hormone sensitive malignancy status: Secondary | ICD-10-CM | POA: Diagnosis not present

## 2023-11-25 DIAGNOSIS — C61 Malignant neoplasm of prostate: Secondary | ICD-10-CM | POA: Diagnosis not present

## 2023-12-03 DIAGNOSIS — C61 Malignant neoplasm of prostate: Secondary | ICD-10-CM | POA: Diagnosis not present

## 2023-12-05 ENCOUNTER — Other Ambulatory Visit: Payer: Self-pay

## 2023-12-05 ENCOUNTER — Ambulatory Visit
Admission: RE | Admit: 2023-12-05 | Discharge: 2023-12-05 | Disposition: A | Discharge status: Home / Self Care | Source: Ambulatory Visit | Attending: Radiation Oncology | Admitting: Radiation Oncology

## 2023-12-05 DIAGNOSIS — C61 Malignant neoplasm of prostate: Secondary | ICD-10-CM | POA: Diagnosis not present

## 2023-12-05 DIAGNOSIS — Z191 Hormone sensitive malignancy status: Secondary | ICD-10-CM | POA: Diagnosis not present

## 2023-12-05 DIAGNOSIS — Z510 Encounter for antineoplastic radiation therapy: Secondary | ICD-10-CM | POA: Diagnosis not present

## 2023-12-05 LAB — RAD ONC ARIA SESSION SUMMARY
Course Elapsed Days: 0
Plan Fractions Treated to Date: 1
Plan Prescribed Dose Per Fraction: 1.8 Gy
Plan Total Fractions Prescribed: 25
Plan Total Prescribed Dose: 45 Gy
Reference Point Dosage Given to Date: 1.8 Gy
Reference Point Session Dosage Given: 1.8 Gy
Session Number: 1

## 2023-12-06 ENCOUNTER — Ambulatory Visit
Admission: RE | Admit: 2023-12-06 | Discharge: 2023-12-06 | Disposition: A | Discharge status: Home / Self Care | Source: Ambulatory Visit | Attending: Radiation Oncology | Admitting: Radiation Oncology

## 2023-12-06 ENCOUNTER — Other Ambulatory Visit: Payer: Self-pay

## 2023-12-06 DIAGNOSIS — Z191 Hormone sensitive malignancy status: Secondary | ICD-10-CM | POA: Diagnosis not present

## 2023-12-06 DIAGNOSIS — Z510 Encounter for antineoplastic radiation therapy: Secondary | ICD-10-CM | POA: Diagnosis not present

## 2023-12-06 DIAGNOSIS — C61 Malignant neoplasm of prostate: Secondary | ICD-10-CM | POA: Diagnosis not present

## 2023-12-06 LAB — RAD ONC ARIA SESSION SUMMARY
Course Elapsed Days: 1
Plan Fractions Treated to Date: 2
Plan Prescribed Dose Per Fraction: 1.8 Gy
Plan Total Fractions Prescribed: 25
Plan Total Prescribed Dose: 45 Gy
Reference Point Dosage Given to Date: 3.6 Gy
Reference Point Session Dosage Given: 1.8 Gy
Session Number: 2

## 2023-12-09 ENCOUNTER — Ambulatory Visit
Admission: RE | Admit: 2023-12-09 | Discharge: 2023-12-09 | Disposition: A | Discharge status: Home / Self Care | Source: Ambulatory Visit | Attending: Radiation Oncology | Admitting: Radiation Oncology

## 2023-12-09 ENCOUNTER — Other Ambulatory Visit: Payer: Self-pay

## 2023-12-09 DIAGNOSIS — Z191 Hormone sensitive malignancy status: Secondary | ICD-10-CM | POA: Diagnosis not present

## 2023-12-09 DIAGNOSIS — C61 Malignant neoplasm of prostate: Secondary | ICD-10-CM | POA: Diagnosis not present

## 2023-12-09 DIAGNOSIS — Z510 Encounter for antineoplastic radiation therapy: Secondary | ICD-10-CM | POA: Diagnosis not present

## 2023-12-09 LAB — RAD ONC ARIA SESSION SUMMARY
Course Elapsed Days: 4
Plan Fractions Treated to Date: 3
Plan Prescribed Dose Per Fraction: 1.8 Gy
Plan Total Fractions Prescribed: 25
Plan Total Prescribed Dose: 45 Gy
Reference Point Dosage Given to Date: 5.4 Gy
Reference Point Session Dosage Given: 1.8 Gy
Session Number: 3

## 2023-12-10 ENCOUNTER — Ambulatory Visit
Admission: RE | Admit: 2023-12-10 | Discharge: 2023-12-10 | Disposition: A | Discharge status: Home / Self Care | Source: Ambulatory Visit | Attending: Radiation Oncology | Admitting: Radiation Oncology

## 2023-12-10 ENCOUNTER — Other Ambulatory Visit: Payer: Self-pay

## 2023-12-10 DIAGNOSIS — Z510 Encounter for antineoplastic radiation therapy: Secondary | ICD-10-CM | POA: Diagnosis not present

## 2023-12-10 DIAGNOSIS — Z191 Hormone sensitive malignancy status: Secondary | ICD-10-CM | POA: Diagnosis not present

## 2023-12-10 DIAGNOSIS — C61 Malignant neoplasm of prostate: Secondary | ICD-10-CM | POA: Diagnosis not present

## 2023-12-10 LAB — RAD ONC ARIA SESSION SUMMARY
Course Elapsed Days: 5
Plan Fractions Treated to Date: 4
Plan Prescribed Dose Per Fraction: 1.8 Gy
Plan Total Fractions Prescribed: 25
Plan Total Prescribed Dose: 45 Gy
Reference Point Dosage Given to Date: 7.2 Gy
Reference Point Session Dosage Given: 1.8 Gy
Session Number: 4

## 2023-12-11 ENCOUNTER — Ambulatory Visit

## 2023-12-12 ENCOUNTER — Other Ambulatory Visit: Payer: Self-pay

## 2023-12-12 ENCOUNTER — Ambulatory Visit
Admission: RE | Admit: 2023-12-12 | Discharge: 2023-12-12 | Disposition: A | Discharge status: Home / Self Care | Source: Ambulatory Visit | Attending: Radiation Oncology | Admitting: Radiation Oncology

## 2023-12-12 ENCOUNTER — Ambulatory Visit

## 2023-12-12 DIAGNOSIS — Z191 Hormone sensitive malignancy status: Secondary | ICD-10-CM | POA: Diagnosis not present

## 2023-12-12 DIAGNOSIS — Z510 Encounter for antineoplastic radiation therapy: Secondary | ICD-10-CM | POA: Diagnosis not present

## 2023-12-12 DIAGNOSIS — C61 Malignant neoplasm of prostate: Secondary | ICD-10-CM | POA: Diagnosis not present

## 2023-12-12 LAB — RAD ONC ARIA SESSION SUMMARY
Course Elapsed Days: 7
Plan Fractions Treated to Date: 5
Plan Prescribed Dose Per Fraction: 1.8 Gy
Plan Total Fractions Prescribed: 25
Plan Total Prescribed Dose: 45 Gy
Reference Point Dosage Given to Date: 9 Gy
Reference Point Session Dosage Given: 1.8 Gy
Session Number: 5

## 2023-12-13 ENCOUNTER — Ambulatory Visit

## 2023-12-13 ENCOUNTER — Other Ambulatory Visit: Payer: Self-pay

## 2023-12-13 ENCOUNTER — Ambulatory Visit
Admission: RE | Admit: 2023-12-13 | Discharge: 2023-12-13 | Disposition: A | Discharge status: Home / Self Care | Source: Ambulatory Visit | Attending: Radiation Oncology | Admitting: Radiation Oncology

## 2023-12-13 DIAGNOSIS — Z191 Hormone sensitive malignancy status: Secondary | ICD-10-CM | POA: Diagnosis not present

## 2023-12-13 DIAGNOSIS — C61 Malignant neoplasm of prostate: Secondary | ICD-10-CM | POA: Diagnosis not present

## 2023-12-13 DIAGNOSIS — Z510 Encounter for antineoplastic radiation therapy: Secondary | ICD-10-CM | POA: Diagnosis not present

## 2023-12-13 LAB — RAD ONC ARIA SESSION SUMMARY
Course Elapsed Days: 8
Plan Fractions Treated to Date: 6
Plan Prescribed Dose Per Fraction: 1.8 Gy
Plan Total Fractions Prescribed: 25
Plan Total Prescribed Dose: 45 Gy
Reference Point Dosage Given to Date: 10.8 Gy
Reference Point Session Dosage Given: 1.8 Gy
Session Number: 6

## 2023-12-16 ENCOUNTER — Ambulatory Visit
Admission: RE | Admit: 2023-12-16 | Discharge: 2023-12-16 | Disposition: A | Discharge status: Home / Self Care | Source: Ambulatory Visit | Attending: Radiation Oncology | Admitting: Radiation Oncology

## 2023-12-16 ENCOUNTER — Other Ambulatory Visit: Payer: Self-pay

## 2023-12-16 DIAGNOSIS — Z510 Encounter for antineoplastic radiation therapy: Secondary | ICD-10-CM | POA: Diagnosis not present

## 2023-12-16 DIAGNOSIS — Z191 Hormone sensitive malignancy status: Secondary | ICD-10-CM | POA: Diagnosis not present

## 2023-12-16 DIAGNOSIS — C61 Malignant neoplasm of prostate: Secondary | ICD-10-CM | POA: Diagnosis not present

## 2023-12-16 LAB — RAD ONC ARIA SESSION SUMMARY
Course Elapsed Days: 11
Plan Fractions Treated to Date: 7
Plan Prescribed Dose Per Fraction: 1.8 Gy
Plan Total Fractions Prescribed: 25
Plan Total Prescribed Dose: 45 Gy
Reference Point Dosage Given to Date: 12.6 Gy
Reference Point Session Dosage Given: 1.8 Gy
Session Number: 7

## 2023-12-17 ENCOUNTER — Other Ambulatory Visit: Payer: Self-pay

## 2023-12-17 ENCOUNTER — Ambulatory Visit
Admission: RE | Admit: 2023-12-17 | Discharge: 2023-12-17 | Disposition: A | Discharge status: Home / Self Care | Source: Ambulatory Visit | Attending: Radiation Oncology | Admitting: Radiation Oncology

## 2023-12-17 DIAGNOSIS — C61 Malignant neoplasm of prostate: Secondary | ICD-10-CM | POA: Diagnosis not present

## 2023-12-17 DIAGNOSIS — Z191 Hormone sensitive malignancy status: Secondary | ICD-10-CM | POA: Diagnosis not present

## 2023-12-17 DIAGNOSIS — Z510 Encounter for antineoplastic radiation therapy: Secondary | ICD-10-CM | POA: Diagnosis not present

## 2023-12-17 LAB — RAD ONC ARIA SESSION SUMMARY
Course Elapsed Days: 12
Plan Fractions Treated to Date: 8
Plan Prescribed Dose Per Fraction: 1.8 Gy
Plan Total Fractions Prescribed: 25
Plan Total Prescribed Dose: 45 Gy
Reference Point Dosage Given to Date: 14.4 Gy
Reference Point Session Dosage Given: 1.8 Gy
Session Number: 8

## 2023-12-18 ENCOUNTER — Ambulatory Visit
Admission: RE | Admit: 2023-12-18 | Discharge: 2023-12-18 | Disposition: A | Source: Ambulatory Visit | Attending: Radiation Oncology

## 2023-12-18 ENCOUNTER — Other Ambulatory Visit: Payer: Self-pay

## 2023-12-18 DIAGNOSIS — Z191 Hormone sensitive malignancy status: Secondary | ICD-10-CM | POA: Diagnosis not present

## 2023-12-18 DIAGNOSIS — Z510 Encounter for antineoplastic radiation therapy: Secondary | ICD-10-CM | POA: Diagnosis not present

## 2023-12-18 DIAGNOSIS — C61 Malignant neoplasm of prostate: Secondary | ICD-10-CM | POA: Diagnosis not present

## 2023-12-18 LAB — RAD ONC ARIA SESSION SUMMARY
Course Elapsed Days: 13
Plan Fractions Treated to Date: 9
Plan Prescribed Dose Per Fraction: 1.8 Gy
Plan Total Fractions Prescribed: 25
Plan Total Prescribed Dose: 45 Gy
Reference Point Dosage Given to Date: 16.2 Gy
Reference Point Session Dosage Given: 1.8 Gy
Session Number: 9

## 2023-12-19 ENCOUNTER — Ambulatory Visit
Admission: RE | Admit: 2023-12-19 | Discharge: 2023-12-19 | Disposition: A | Discharge status: Home / Self Care | Source: Ambulatory Visit | Attending: Radiation Oncology | Admitting: Radiation Oncology

## 2023-12-19 ENCOUNTER — Other Ambulatory Visit: Payer: Self-pay

## 2023-12-19 DIAGNOSIS — C61 Malignant neoplasm of prostate: Secondary | ICD-10-CM | POA: Diagnosis not present

## 2023-12-19 DIAGNOSIS — Z191 Hormone sensitive malignancy status: Secondary | ICD-10-CM | POA: Diagnosis not present

## 2023-12-19 DIAGNOSIS — Z510 Encounter for antineoplastic radiation therapy: Secondary | ICD-10-CM | POA: Diagnosis not present

## 2023-12-19 LAB — RAD ONC ARIA SESSION SUMMARY
Course Elapsed Days: 14
Plan Fractions Treated to Date: 10
Plan Prescribed Dose Per Fraction: 1.8 Gy
Plan Total Fractions Prescribed: 25
Plan Total Prescribed Dose: 45 Gy
Reference Point Dosage Given to Date: 18 Gy
Reference Point Session Dosage Given: 1.8 Gy
Session Number: 10

## 2023-12-20 ENCOUNTER — Ambulatory Visit
Admission: RE | Admit: 2023-12-20 | Discharge: 2023-12-20 | Disposition: A | Source: Ambulatory Visit | Attending: Radiation Oncology

## 2023-12-20 ENCOUNTER — Other Ambulatory Visit: Payer: Self-pay

## 2023-12-20 DIAGNOSIS — C61 Malignant neoplasm of prostate: Secondary | ICD-10-CM | POA: Diagnosis not present

## 2023-12-20 DIAGNOSIS — Z191 Hormone sensitive malignancy status: Secondary | ICD-10-CM | POA: Diagnosis not present

## 2023-12-20 DIAGNOSIS — Z510 Encounter for antineoplastic radiation therapy: Secondary | ICD-10-CM | POA: Diagnosis not present

## 2023-12-20 LAB — RAD ONC ARIA SESSION SUMMARY
Course Elapsed Days: 15
Plan Fractions Treated to Date: 11
Plan Prescribed Dose Per Fraction: 1.8 Gy
Plan Total Fractions Prescribed: 25
Plan Total Prescribed Dose: 45 Gy
Reference Point Dosage Given to Date: 19.8 Gy
Reference Point Session Dosage Given: 1.8 Gy
Session Number: 11

## 2023-12-23 ENCOUNTER — Ambulatory Visit
Admission: RE | Admit: 2023-12-23 | Discharge: 2023-12-23 | Disposition: A | Discharge status: Home / Self Care | Source: Ambulatory Visit | Attending: Radiation Oncology | Admitting: Radiation Oncology

## 2023-12-23 ENCOUNTER — Other Ambulatory Visit: Payer: Self-pay

## 2023-12-23 ENCOUNTER — Ambulatory Visit

## 2023-12-23 DIAGNOSIS — Z510 Encounter for antineoplastic radiation therapy: Secondary | ICD-10-CM | POA: Diagnosis not present

## 2023-12-23 DIAGNOSIS — C61 Malignant neoplasm of prostate: Secondary | ICD-10-CM | POA: Diagnosis not present

## 2023-12-23 DIAGNOSIS — Z191 Hormone sensitive malignancy status: Secondary | ICD-10-CM | POA: Diagnosis not present

## 2023-12-23 LAB — RAD ONC ARIA SESSION SUMMARY
Course Elapsed Days: 18
Plan Fractions Treated to Date: 12
Plan Prescribed Dose Per Fraction: 1.8 Gy
Plan Total Fractions Prescribed: 25
Plan Total Prescribed Dose: 45 Gy
Reference Point Dosage Given to Date: 21.6 Gy
Reference Point Session Dosage Given: 1.8 Gy
Session Number: 12

## 2023-12-24 ENCOUNTER — Ambulatory Visit
Admission: RE | Admit: 2023-12-24 | Discharge: 2023-12-24 | Disposition: A | Source: Ambulatory Visit | Attending: Radiation Oncology

## 2023-12-24 ENCOUNTER — Other Ambulatory Visit: Payer: Self-pay

## 2023-12-24 DIAGNOSIS — Z510 Encounter for antineoplastic radiation therapy: Secondary | ICD-10-CM | POA: Diagnosis not present

## 2023-12-24 DIAGNOSIS — C61 Malignant neoplasm of prostate: Secondary | ICD-10-CM | POA: Diagnosis not present

## 2023-12-24 DIAGNOSIS — Z191 Hormone sensitive malignancy status: Secondary | ICD-10-CM | POA: Diagnosis not present

## 2023-12-24 LAB — RAD ONC ARIA SESSION SUMMARY
Course Elapsed Days: 19
Plan Fractions Treated to Date: 13
Plan Prescribed Dose Per Fraction: 1.8 Gy
Plan Total Fractions Prescribed: 25
Plan Total Prescribed Dose: 45 Gy
Reference Point Dosage Given to Date: 23.4 Gy
Reference Point Session Dosage Given: 1.8 Gy
Session Number: 13

## 2023-12-25 ENCOUNTER — Other Ambulatory Visit: Payer: Self-pay

## 2023-12-25 ENCOUNTER — Ambulatory Visit
Admission: RE | Admit: 2023-12-25 | Discharge: 2023-12-25 | Disposition: A | Source: Ambulatory Visit | Attending: Radiation Oncology

## 2023-12-25 DIAGNOSIS — C61 Malignant neoplasm of prostate: Secondary | ICD-10-CM | POA: Diagnosis not present

## 2023-12-25 DIAGNOSIS — Z510 Encounter for antineoplastic radiation therapy: Secondary | ICD-10-CM | POA: Diagnosis not present

## 2023-12-25 DIAGNOSIS — Z191 Hormone sensitive malignancy status: Secondary | ICD-10-CM | POA: Diagnosis not present

## 2023-12-25 LAB — RAD ONC ARIA SESSION SUMMARY
Course Elapsed Days: 20
Plan Fractions Treated to Date: 14
Plan Prescribed Dose Per Fraction: 1.8 Gy
Plan Total Fractions Prescribed: 25
Plan Total Prescribed Dose: 45 Gy
Reference Point Dosage Given to Date: 25.2 Gy
Reference Point Session Dosage Given: 1.8 Gy
Session Number: 14

## 2023-12-26 ENCOUNTER — Ambulatory Visit
Admission: RE | Admit: 2023-12-26 | Discharge: 2023-12-26 | Disposition: A | Source: Ambulatory Visit | Attending: Radiation Oncology

## 2023-12-26 ENCOUNTER — Other Ambulatory Visit: Payer: Self-pay

## 2023-12-26 DIAGNOSIS — Z191 Hormone sensitive malignancy status: Secondary | ICD-10-CM | POA: Diagnosis not present

## 2023-12-26 DIAGNOSIS — Z510 Encounter for antineoplastic radiation therapy: Secondary | ICD-10-CM | POA: Diagnosis not present

## 2023-12-26 DIAGNOSIS — C61 Malignant neoplasm of prostate: Secondary | ICD-10-CM | POA: Diagnosis not present

## 2023-12-26 LAB — RAD ONC ARIA SESSION SUMMARY
Course Elapsed Days: 21
Plan Fractions Treated to Date: 15
Plan Prescribed Dose Per Fraction: 1.8 Gy
Plan Total Fractions Prescribed: 25
Plan Total Prescribed Dose: 45 Gy
Reference Point Dosage Given to Date: 27 Gy
Reference Point Session Dosage Given: 1.8 Gy
Session Number: 15

## 2023-12-27 ENCOUNTER — Ambulatory Visit
Admission: RE | Admit: 2023-12-27 | Discharge: 2023-12-27 | Disposition: A | Discharge status: Home / Self Care | Source: Ambulatory Visit | Attending: Radiation Oncology | Admitting: Radiation Oncology

## 2023-12-27 ENCOUNTER — Other Ambulatory Visit: Payer: Self-pay

## 2023-12-27 DIAGNOSIS — Z191 Hormone sensitive malignancy status: Secondary | ICD-10-CM | POA: Diagnosis not present

## 2023-12-27 DIAGNOSIS — Z510 Encounter for antineoplastic radiation therapy: Secondary | ICD-10-CM | POA: Diagnosis not present

## 2023-12-27 DIAGNOSIS — C61 Malignant neoplasm of prostate: Secondary | ICD-10-CM | POA: Diagnosis not present

## 2023-12-27 LAB — RAD ONC ARIA SESSION SUMMARY
Course Elapsed Days: 22
Plan Fractions Treated to Date: 16
Plan Prescribed Dose Per Fraction: 1.8 Gy
Plan Total Fractions Prescribed: 25
Plan Total Prescribed Dose: 45 Gy
Reference Point Dosage Given to Date: 28.8 Gy
Reference Point Session Dosage Given: 1.8 Gy
Session Number: 16

## 2023-12-30 ENCOUNTER — Ambulatory Visit

## 2023-12-30 ENCOUNTER — Ambulatory Visit
Admission: RE | Admit: 2023-12-30 | Discharge: 2023-12-30 | Disposition: A | Source: Ambulatory Visit | Attending: Radiation Oncology

## 2023-12-30 ENCOUNTER — Other Ambulatory Visit: Payer: Self-pay

## 2023-12-30 DIAGNOSIS — Z191 Hormone sensitive malignancy status: Secondary | ICD-10-CM | POA: Diagnosis not present

## 2023-12-30 DIAGNOSIS — C61 Malignant neoplasm of prostate: Secondary | ICD-10-CM | POA: Diagnosis not present

## 2023-12-30 DIAGNOSIS — Z510 Encounter for antineoplastic radiation therapy: Secondary | ICD-10-CM | POA: Diagnosis not present

## 2023-12-30 LAB — RAD ONC ARIA SESSION SUMMARY
Course Elapsed Days: 25
Plan Fractions Treated to Date: 17
Plan Prescribed Dose Per Fraction: 1.8 Gy
Plan Total Fractions Prescribed: 25
Plan Total Prescribed Dose: 45 Gy
Reference Point Dosage Given to Date: 30.6 Gy
Reference Point Session Dosage Given: 1.8 Gy
Session Number: 17

## 2023-12-31 ENCOUNTER — Ambulatory Visit
Admission: RE | Admit: 2023-12-31 | Discharge: 2023-12-31 | Disposition: A | Discharge status: Home / Self Care | Source: Ambulatory Visit | Attending: Radiation Oncology | Admitting: Radiation Oncology

## 2023-12-31 ENCOUNTER — Other Ambulatory Visit: Payer: Self-pay

## 2023-12-31 DIAGNOSIS — Z510 Encounter for antineoplastic radiation therapy: Secondary | ICD-10-CM | POA: Diagnosis not present

## 2023-12-31 DIAGNOSIS — Z191 Hormone sensitive malignancy status: Secondary | ICD-10-CM | POA: Diagnosis not present

## 2023-12-31 DIAGNOSIS — C61 Malignant neoplasm of prostate: Secondary | ICD-10-CM | POA: Diagnosis not present

## 2023-12-31 LAB — RAD ONC ARIA SESSION SUMMARY
Course Elapsed Days: 26
Plan Fractions Treated to Date: 18
Plan Prescribed Dose Per Fraction: 1.8 Gy
Plan Total Fractions Prescribed: 25
Plan Total Prescribed Dose: 45 Gy
Reference Point Dosage Given to Date: 32.4 Gy
Reference Point Session Dosage Given: 1.8 Gy
Session Number: 18

## 2024-01-01 ENCOUNTER — Ambulatory Visit
Admission: RE | Admit: 2024-01-01 | Discharge: 2024-01-01 | Disposition: A | Source: Ambulatory Visit | Attending: Radiation Oncology

## 2024-01-01 ENCOUNTER — Other Ambulatory Visit: Payer: Self-pay

## 2024-01-01 DIAGNOSIS — Z510 Encounter for antineoplastic radiation therapy: Secondary | ICD-10-CM | POA: Diagnosis not present

## 2024-01-01 DIAGNOSIS — Z191 Hormone sensitive malignancy status: Secondary | ICD-10-CM | POA: Diagnosis not present

## 2024-01-01 DIAGNOSIS — C61 Malignant neoplasm of prostate: Secondary | ICD-10-CM | POA: Diagnosis not present

## 2024-01-01 LAB — RAD ONC ARIA SESSION SUMMARY
Course Elapsed Days: 27
Plan Fractions Treated to Date: 19
Plan Prescribed Dose Per Fraction: 1.8 Gy
Plan Total Fractions Prescribed: 25
Plan Total Prescribed Dose: 45 Gy
Reference Point Dosage Given to Date: 34.2 Gy
Reference Point Session Dosage Given: 1.8 Gy
Session Number: 19

## 2024-01-02 ENCOUNTER — Other Ambulatory Visit: Payer: Self-pay

## 2024-01-02 ENCOUNTER — Ambulatory Visit
Admission: RE | Admit: 2024-01-02 | Discharge: 2024-01-02 | Disposition: A | Source: Ambulatory Visit | Attending: Radiation Oncology

## 2024-01-02 DIAGNOSIS — C61 Malignant neoplasm of prostate: Secondary | ICD-10-CM | POA: Diagnosis not present

## 2024-01-02 DIAGNOSIS — Z191 Hormone sensitive malignancy status: Secondary | ICD-10-CM | POA: Diagnosis not present

## 2024-01-02 DIAGNOSIS — Z510 Encounter for antineoplastic radiation therapy: Secondary | ICD-10-CM | POA: Diagnosis not present

## 2024-01-02 LAB — RAD ONC ARIA SESSION SUMMARY
Course Elapsed Days: 28
Plan Fractions Treated to Date: 20
Plan Prescribed Dose Per Fraction: 1.8 Gy
Plan Total Fractions Prescribed: 25
Plan Total Prescribed Dose: 45 Gy
Reference Point Dosage Given to Date: 36 Gy
Reference Point Session Dosage Given: 1.8 Gy
Session Number: 20

## 2024-01-03 ENCOUNTER — Ambulatory Visit
Admission: RE | Admit: 2024-01-03 | Discharge: 2024-01-03 | Disposition: A | Discharge status: Home / Self Care | Source: Ambulatory Visit | Attending: Radiation Oncology | Admitting: Radiation Oncology

## 2024-01-03 ENCOUNTER — Other Ambulatory Visit: Payer: Self-pay

## 2024-01-03 DIAGNOSIS — C61 Malignant neoplasm of prostate: Secondary | ICD-10-CM | POA: Diagnosis not present

## 2024-01-03 DIAGNOSIS — Z191 Hormone sensitive malignancy status: Secondary | ICD-10-CM | POA: Diagnosis not present

## 2024-01-03 DIAGNOSIS — Z510 Encounter for antineoplastic radiation therapy: Secondary | ICD-10-CM | POA: Diagnosis not present

## 2024-01-03 LAB — RAD ONC ARIA SESSION SUMMARY
Course Elapsed Days: 29
Plan Fractions Treated to Date: 21
Plan Prescribed Dose Per Fraction: 1.8 Gy
Plan Total Fractions Prescribed: 25
Plan Total Prescribed Dose: 45 Gy
Reference Point Dosage Given to Date: 37.8 Gy
Reference Point Session Dosage Given: 1.8 Gy
Session Number: 21

## 2024-01-06 ENCOUNTER — Other Ambulatory Visit: Payer: Self-pay

## 2024-01-06 ENCOUNTER — Ambulatory Visit
Admission: RE | Admit: 2024-01-06 | Discharge: 2024-01-06 | Disposition: A | Discharge status: Home / Self Care | Payer: Self-pay | Source: Ambulatory Visit | Attending: Radiation Oncology | Admitting: Radiation Oncology

## 2024-01-06 DIAGNOSIS — Z191 Hormone sensitive malignancy status: Secondary | ICD-10-CM | POA: Diagnosis not present

## 2024-01-06 DIAGNOSIS — C61 Malignant neoplasm of prostate: Secondary | ICD-10-CM | POA: Insufficient documentation

## 2024-01-06 DIAGNOSIS — Z510 Encounter for antineoplastic radiation therapy: Secondary | ICD-10-CM | POA: Diagnosis not present

## 2024-01-06 LAB — RAD ONC ARIA SESSION SUMMARY
Course Elapsed Days: 32
Plan Fractions Treated to Date: 22
Plan Prescribed Dose Per Fraction: 1.8 Gy
Plan Total Fractions Prescribed: 25
Plan Total Prescribed Dose: 45 Gy
Reference Point Dosage Given to Date: 39.6 Gy
Reference Point Session Dosage Given: 1.8 Gy
Session Number: 22

## 2024-01-07 ENCOUNTER — Ambulatory Visit
Admission: RE | Admit: 2024-01-07 | Discharge: 2024-01-07 | Disposition: A | Payer: Self-pay | Source: Ambulatory Visit | Attending: Radiation Oncology

## 2024-01-07 ENCOUNTER — Other Ambulatory Visit: Payer: Self-pay

## 2024-01-07 DIAGNOSIS — C61 Malignant neoplasm of prostate: Secondary | ICD-10-CM | POA: Diagnosis not present

## 2024-01-07 DIAGNOSIS — Z510 Encounter for antineoplastic radiation therapy: Secondary | ICD-10-CM | POA: Diagnosis not present

## 2024-01-07 DIAGNOSIS — Z191 Hormone sensitive malignancy status: Secondary | ICD-10-CM | POA: Diagnosis not present

## 2024-01-07 LAB — RAD ONC ARIA SESSION SUMMARY
Course Elapsed Days: 33
Plan Fractions Treated to Date: 23
Plan Prescribed Dose Per Fraction: 1.8 Gy
Plan Total Fractions Prescribed: 25
Plan Total Prescribed Dose: 45 Gy
Reference Point Dosage Given to Date: 41.4 Gy
Reference Point Session Dosage Given: 1.8 Gy
Session Number: 23

## 2024-01-08 ENCOUNTER — Other Ambulatory Visit: Payer: Self-pay

## 2024-01-08 ENCOUNTER — Ambulatory Visit
Admission: RE | Admit: 2024-01-08 | Discharge: 2024-01-08 | Disposition: A | Payer: Self-pay | Source: Ambulatory Visit | Attending: Radiation Oncology

## 2024-01-08 DIAGNOSIS — Z191 Hormone sensitive malignancy status: Secondary | ICD-10-CM | POA: Diagnosis not present

## 2024-01-08 DIAGNOSIS — Z510 Encounter for antineoplastic radiation therapy: Secondary | ICD-10-CM | POA: Diagnosis not present

## 2024-01-08 DIAGNOSIS — C61 Malignant neoplasm of prostate: Secondary | ICD-10-CM | POA: Diagnosis not present

## 2024-01-08 LAB — RAD ONC ARIA SESSION SUMMARY
Course Elapsed Days: 34
Plan Fractions Treated to Date: 24
Plan Prescribed Dose Per Fraction: 1.8 Gy
Plan Total Fractions Prescribed: 25
Plan Total Prescribed Dose: 45 Gy
Reference Point Dosage Given to Date: 43.2 Gy
Reference Point Session Dosage Given: 1.8 Gy
Session Number: 24

## 2024-01-09 ENCOUNTER — Ambulatory Visit

## 2024-01-09 ENCOUNTER — Ambulatory Visit
Admission: RE | Admit: 2024-01-09 | Discharge: 2024-01-09 | Disposition: A | Discharge status: Home / Self Care | Source: Ambulatory Visit | Attending: Radiation Oncology | Admitting: Radiation Oncology

## 2024-01-09 ENCOUNTER — Other Ambulatory Visit: Payer: Self-pay

## 2024-01-09 ENCOUNTER — Ambulatory Visit: Payer: Self-pay

## 2024-01-09 DIAGNOSIS — Z191 Hormone sensitive malignancy status: Secondary | ICD-10-CM | POA: Diagnosis not present

## 2024-01-09 DIAGNOSIS — C61 Malignant neoplasm of prostate: Secondary | ICD-10-CM | POA: Diagnosis not present

## 2024-01-09 DIAGNOSIS — Z510 Encounter for antineoplastic radiation therapy: Secondary | ICD-10-CM | POA: Diagnosis not present

## 2024-01-09 LAB — RAD ONC ARIA SESSION SUMMARY
Course Elapsed Days: 35
Plan Fractions Treated to Date: 25
Plan Prescribed Dose Per Fraction: 1.8 Gy
Plan Total Fractions Prescribed: 25
Plan Total Prescribed Dose: 45 Gy
Reference Point Dosage Given to Date: 45 Gy
Reference Point Session Dosage Given: 1.8 Gy
Session Number: 25

## 2024-01-10 ENCOUNTER — Other Ambulatory Visit: Payer: Self-pay

## 2024-01-10 ENCOUNTER — Ambulatory Visit: Payer: Self-pay

## 2024-01-10 ENCOUNTER — Ambulatory Visit
Admission: RE | Admit: 2024-01-10 | Discharge: 2024-01-10 | Disposition: A | Discharge status: Home / Self Care | Source: Ambulatory Visit | Attending: Radiation Oncology | Admitting: Radiation Oncology

## 2024-01-10 DIAGNOSIS — Z191 Hormone sensitive malignancy status: Secondary | ICD-10-CM | POA: Diagnosis not present

## 2024-01-10 DIAGNOSIS — Z510 Encounter for antineoplastic radiation therapy: Secondary | ICD-10-CM | POA: Diagnosis not present

## 2024-01-10 DIAGNOSIS — C61 Malignant neoplasm of prostate: Secondary | ICD-10-CM | POA: Diagnosis not present

## 2024-01-10 LAB — RAD ONC ARIA SESSION SUMMARY
Course Elapsed Days: 36
Plan Fractions Treated to Date: 1
Plan Prescribed Dose Per Fraction: 1.8 Gy
Plan Total Fractions Prescribed: 13
Plan Total Prescribed Dose: 23.4 Gy
Reference Point Dosage Given to Date: 1.8 Gy
Reference Point Session Dosage Given: 1.8 Gy
Session Number: 26

## 2024-01-13 ENCOUNTER — Ambulatory Visit: Payer: Self-pay

## 2024-01-13 ENCOUNTER — Ambulatory Visit
Admission: RE | Admit: 2024-01-13 | Discharge: 2024-01-13 | Disposition: A | Discharge status: Home / Self Care | Payer: Self-pay | Source: Ambulatory Visit | Attending: Radiation Oncology | Admitting: Radiation Oncology

## 2024-01-13 ENCOUNTER — Other Ambulatory Visit: Payer: Self-pay

## 2024-01-13 DIAGNOSIS — C61 Malignant neoplasm of prostate: Secondary | ICD-10-CM | POA: Diagnosis not present

## 2024-01-13 DIAGNOSIS — Z191 Hormone sensitive malignancy status: Secondary | ICD-10-CM | POA: Diagnosis not present

## 2024-01-13 DIAGNOSIS — Z510 Encounter for antineoplastic radiation therapy: Secondary | ICD-10-CM | POA: Diagnosis not present

## 2024-01-13 LAB — RAD ONC ARIA SESSION SUMMARY
Course Elapsed Days: 39
Plan Fractions Treated to Date: 2
Plan Prescribed Dose Per Fraction: 1.8 Gy
Plan Total Fractions Prescribed: 13
Plan Total Prescribed Dose: 23.4 Gy
Reference Point Dosage Given to Date: 3.6 Gy
Reference Point Session Dosage Given: 1.8 Gy
Session Number: 27

## 2024-01-14 ENCOUNTER — Ambulatory Visit
Admission: RE | Admit: 2024-01-14 | Discharge: 2024-01-14 | Disposition: A | Discharge status: Home / Self Care | Payer: Self-pay | Source: Ambulatory Visit | Attending: Radiation Oncology | Admitting: Radiation Oncology

## 2024-01-14 ENCOUNTER — Other Ambulatory Visit: Payer: Self-pay

## 2024-01-14 DIAGNOSIS — C61 Malignant neoplasm of prostate: Secondary | ICD-10-CM | POA: Diagnosis not present

## 2024-01-14 DIAGNOSIS — Z510 Encounter for antineoplastic radiation therapy: Secondary | ICD-10-CM | POA: Diagnosis not present

## 2024-01-14 DIAGNOSIS — Z191 Hormone sensitive malignancy status: Secondary | ICD-10-CM | POA: Diagnosis not present

## 2024-01-14 LAB — RAD ONC ARIA SESSION SUMMARY
Course Elapsed Days: 40
Plan Fractions Treated to Date: 3
Plan Prescribed Dose Per Fraction: 1.8 Gy
Plan Total Fractions Prescribed: 13
Plan Total Prescribed Dose: 23.4 Gy
Reference Point Dosage Given to Date: 5.4 Gy
Reference Point Session Dosage Given: 1.8 Gy
Session Number: 28

## 2024-01-15 ENCOUNTER — Ambulatory Visit
Admission: RE | Admit: 2024-01-15 | Discharge: 2024-01-15 | Disposition: A | Discharge status: Home / Self Care | Payer: Self-pay | Source: Ambulatory Visit | Attending: Radiation Oncology | Admitting: Radiation Oncology

## 2024-01-15 ENCOUNTER — Other Ambulatory Visit: Payer: Self-pay

## 2024-01-15 DIAGNOSIS — Z510 Encounter for antineoplastic radiation therapy: Secondary | ICD-10-CM | POA: Diagnosis not present

## 2024-01-15 DIAGNOSIS — Z191 Hormone sensitive malignancy status: Secondary | ICD-10-CM | POA: Diagnosis not present

## 2024-01-15 DIAGNOSIS — C61 Malignant neoplasm of prostate: Secondary | ICD-10-CM | POA: Diagnosis not present

## 2024-01-15 LAB — RAD ONC ARIA SESSION SUMMARY
Course Elapsed Days: 41
Plan Fractions Treated to Date: 4
Plan Prescribed Dose Per Fraction: 1.8 Gy
Plan Total Fractions Prescribed: 13
Plan Total Prescribed Dose: 23.4 Gy
Reference Point Dosage Given to Date: 7.2 Gy
Reference Point Session Dosage Given: 1.8 Gy
Session Number: 29

## 2024-01-16 ENCOUNTER — Ambulatory Visit
Admission: RE | Admit: 2024-01-16 | Discharge: 2024-01-16 | Disposition: A | Payer: Self-pay | Source: Ambulatory Visit | Attending: Radiation Oncology

## 2024-01-16 ENCOUNTER — Other Ambulatory Visit: Payer: Self-pay

## 2024-01-16 ENCOUNTER — Ambulatory Visit

## 2024-01-16 DIAGNOSIS — Z510 Encounter for antineoplastic radiation therapy: Secondary | ICD-10-CM | POA: Diagnosis not present

## 2024-01-16 DIAGNOSIS — Z191 Hormone sensitive malignancy status: Secondary | ICD-10-CM | POA: Diagnosis not present

## 2024-01-16 DIAGNOSIS — C61 Malignant neoplasm of prostate: Secondary | ICD-10-CM | POA: Diagnosis not present

## 2024-01-16 LAB — RAD ONC ARIA SESSION SUMMARY
Course Elapsed Days: 42
Plan Fractions Treated to Date: 5
Plan Prescribed Dose Per Fraction: 1.8 Gy
Plan Total Fractions Prescribed: 13
Plan Total Prescribed Dose: 23.4 Gy
Reference Point Dosage Given to Date: 9 Gy
Reference Point Session Dosage Given: 1.8 Gy
Session Number: 30

## 2024-01-17 ENCOUNTER — Ambulatory Visit

## 2024-01-17 ENCOUNTER — Ambulatory Visit
Admission: RE | Admit: 2024-01-17 | Discharge: 2024-01-17 | Disposition: A | Discharge status: Home / Self Care | Payer: Self-pay | Source: Ambulatory Visit | Attending: Radiation Oncology | Admitting: Radiation Oncology

## 2024-01-17 ENCOUNTER — Other Ambulatory Visit: Payer: Self-pay

## 2024-01-17 DIAGNOSIS — Z510 Encounter for antineoplastic radiation therapy: Secondary | ICD-10-CM | POA: Diagnosis not present

## 2024-01-17 DIAGNOSIS — C61 Malignant neoplasm of prostate: Secondary | ICD-10-CM | POA: Diagnosis not present

## 2024-01-17 DIAGNOSIS — Z191 Hormone sensitive malignancy status: Secondary | ICD-10-CM | POA: Diagnosis not present

## 2024-01-17 LAB — RAD ONC ARIA SESSION SUMMARY
Course Elapsed Days: 43
Plan Fractions Treated to Date: 6
Plan Prescribed Dose Per Fraction: 1.8 Gy
Plan Total Fractions Prescribed: 13
Plan Total Prescribed Dose: 23.4 Gy
Reference Point Dosage Given to Date: 10.8 Gy
Reference Point Session Dosage Given: 1.8 Gy
Session Number: 31

## 2024-01-20 ENCOUNTER — Ambulatory Visit

## 2024-01-20 ENCOUNTER — Ambulatory Visit
Admission: RE | Admit: 2024-01-20 | Discharge: 2024-01-20 | Disposition: A | Payer: Self-pay | Source: Ambulatory Visit | Attending: Radiation Oncology

## 2024-01-20 ENCOUNTER — Other Ambulatory Visit: Payer: Self-pay

## 2024-01-20 DIAGNOSIS — Z191 Hormone sensitive malignancy status: Secondary | ICD-10-CM | POA: Diagnosis not present

## 2024-01-20 DIAGNOSIS — C61 Malignant neoplasm of prostate: Secondary | ICD-10-CM | POA: Diagnosis not present

## 2024-01-20 DIAGNOSIS — Z510 Encounter for antineoplastic radiation therapy: Secondary | ICD-10-CM | POA: Diagnosis not present

## 2024-01-20 LAB — RAD ONC ARIA SESSION SUMMARY
Course Elapsed Days: 46
Plan Fractions Treated to Date: 7
Plan Prescribed Dose Per Fraction: 1.8 Gy
Plan Total Fractions Prescribed: 13
Plan Total Prescribed Dose: 23.4 Gy
Reference Point Dosage Given to Date: 12.6 Gy
Reference Point Session Dosage Given: 1.8 Gy
Session Number: 32

## 2024-01-21 ENCOUNTER — Ambulatory Visit
Admission: RE | Admit: 2024-01-21 | Discharge: 2024-01-21 | Disposition: A | Discharge status: Home / Self Care | Payer: Self-pay | Source: Ambulatory Visit | Attending: Radiation Oncology | Admitting: Radiation Oncology

## 2024-01-21 ENCOUNTER — Ambulatory Visit
Admission: RE | Admit: 2024-01-21 | Discharge: 2024-01-21 | Disposition: A | Discharge status: Home / Self Care | Source: Ambulatory Visit | Attending: Radiation Oncology | Admitting: Radiation Oncology

## 2024-01-21 ENCOUNTER — Other Ambulatory Visit: Payer: Self-pay

## 2024-01-21 DIAGNOSIS — Z510 Encounter for antineoplastic radiation therapy: Secondary | ICD-10-CM | POA: Diagnosis not present

## 2024-01-21 DIAGNOSIS — Z191 Hormone sensitive malignancy status: Secondary | ICD-10-CM | POA: Diagnosis not present

## 2024-01-21 DIAGNOSIS — C61 Malignant neoplasm of prostate: Secondary | ICD-10-CM | POA: Diagnosis not present

## 2024-01-21 LAB — RAD ONC ARIA SESSION SUMMARY
Course Elapsed Days: 47
Plan Fractions Treated to Date: 8
Plan Prescribed Dose Per Fraction: 1.8 Gy
Plan Total Fractions Prescribed: 13
Plan Total Prescribed Dose: 23.4 Gy
Reference Point Dosage Given to Date: 14.4 Gy
Reference Point Session Dosage Given: 1.8 Gy
Session Number: 33

## 2024-01-22 ENCOUNTER — Other Ambulatory Visit: Payer: Self-pay

## 2024-01-22 ENCOUNTER — Ambulatory Visit
Admission: RE | Admit: 2024-01-22 | Discharge: 2024-01-22 | Disposition: A | Payer: Self-pay | Source: Ambulatory Visit | Attending: Radiation Oncology

## 2024-01-22 DIAGNOSIS — C61 Malignant neoplasm of prostate: Secondary | ICD-10-CM | POA: Diagnosis not present

## 2024-01-22 DIAGNOSIS — Z510 Encounter for antineoplastic radiation therapy: Secondary | ICD-10-CM | POA: Diagnosis not present

## 2024-01-22 DIAGNOSIS — Z191 Hormone sensitive malignancy status: Secondary | ICD-10-CM | POA: Diagnosis not present

## 2024-01-22 LAB — RAD ONC ARIA SESSION SUMMARY
Course Elapsed Days: 48
Plan Fractions Treated to Date: 9
Plan Prescribed Dose Per Fraction: 1.8 Gy
Plan Total Fractions Prescribed: 13
Plan Total Prescribed Dose: 23.4 Gy
Reference Point Dosage Given to Date: 16.2 Gy
Reference Point Session Dosage Given: 1.8 Gy
Session Number: 34

## 2024-01-23 ENCOUNTER — Ambulatory Visit: Payer: Self-pay

## 2024-01-24 ENCOUNTER — Other Ambulatory Visit: Payer: Self-pay

## 2024-01-24 ENCOUNTER — Ambulatory Visit
Admission: RE | Admit: 2024-01-24 | Discharge: 2024-01-24 | Disposition: A | Discharge status: Home / Self Care | Payer: Self-pay | Source: Ambulatory Visit | Attending: Radiation Oncology | Admitting: Radiation Oncology

## 2024-01-24 DIAGNOSIS — Z191 Hormone sensitive malignancy status: Secondary | ICD-10-CM | POA: Diagnosis not present

## 2024-01-24 DIAGNOSIS — C61 Malignant neoplasm of prostate: Secondary | ICD-10-CM | POA: Diagnosis not present

## 2024-01-24 DIAGNOSIS — Z510 Encounter for antineoplastic radiation therapy: Secondary | ICD-10-CM | POA: Diagnosis not present

## 2024-01-24 LAB — RAD ONC ARIA SESSION SUMMARY
Course Elapsed Days: 50
Plan Fractions Treated to Date: 10
Plan Prescribed Dose Per Fraction: 1.8 Gy
Plan Total Fractions Prescribed: 13
Plan Total Prescribed Dose: 23.4 Gy
Reference Point Dosage Given to Date: 18 Gy
Reference Point Session Dosage Given: 1.8 Gy
Session Number: 35

## 2024-01-27 ENCOUNTER — Ambulatory Visit: Payer: Self-pay

## 2024-01-27 ENCOUNTER — Ambulatory Visit
Admission: RE | Admit: 2024-01-27 | Discharge: 2024-01-27 | Disposition: A | Payer: Self-pay | Source: Ambulatory Visit | Attending: Radiation Oncology

## 2024-01-27 ENCOUNTER — Other Ambulatory Visit: Payer: Self-pay

## 2024-01-27 DIAGNOSIS — Z191 Hormone sensitive malignancy status: Secondary | ICD-10-CM | POA: Diagnosis not present

## 2024-01-27 DIAGNOSIS — Z510 Encounter for antineoplastic radiation therapy: Secondary | ICD-10-CM | POA: Diagnosis not present

## 2024-01-27 DIAGNOSIS — C61 Malignant neoplasm of prostate: Secondary | ICD-10-CM | POA: Diagnosis not present

## 2024-01-27 LAB — RAD ONC ARIA SESSION SUMMARY
Course Elapsed Days: 53
Plan Fractions Treated to Date: 11
Plan Prescribed Dose Per Fraction: 1.8 Gy
Plan Total Fractions Prescribed: 13
Plan Total Prescribed Dose: 23.4 Gy
Reference Point Dosage Given to Date: 19.8 Gy
Reference Point Session Dosage Given: 1.8 Gy
Session Number: 36

## 2024-01-28 ENCOUNTER — Ambulatory Visit: Payer: Self-pay

## 2024-01-28 ENCOUNTER — Ambulatory Visit

## 2024-01-28 ENCOUNTER — Ambulatory Visit
Admission: RE | Admit: 2024-01-28 | Discharge: 2024-01-28 | Disposition: A | Source: Ambulatory Visit | Attending: Radiation Oncology

## 2024-01-28 ENCOUNTER — Other Ambulatory Visit: Payer: Self-pay

## 2024-01-28 DIAGNOSIS — C61 Malignant neoplasm of prostate: Secondary | ICD-10-CM | POA: Diagnosis not present

## 2024-01-28 DIAGNOSIS — Z510 Encounter for antineoplastic radiation therapy: Secondary | ICD-10-CM | POA: Diagnosis not present

## 2024-01-28 DIAGNOSIS — Z191 Hormone sensitive malignancy status: Secondary | ICD-10-CM | POA: Diagnosis not present

## 2024-01-28 LAB — RAD ONC ARIA SESSION SUMMARY
Course Elapsed Days: 54
Plan Fractions Treated to Date: 12
Plan Prescribed Dose Per Fraction: 1.8 Gy
Plan Total Fractions Prescribed: 13
Plan Total Prescribed Dose: 23.4 Gy
Reference Point Dosage Given to Date: 21.6 Gy
Reference Point Session Dosage Given: 1.8 Gy
Session Number: 37

## 2024-01-29 ENCOUNTER — Ambulatory Visit
Admission: RE | Admit: 2024-01-29 | Discharge: 2024-01-29 | Disposition: A | Source: Ambulatory Visit | Attending: Radiation Oncology

## 2024-01-29 ENCOUNTER — Ambulatory Visit

## 2024-01-29 ENCOUNTER — Ambulatory Visit: Payer: Self-pay

## 2024-01-29 ENCOUNTER — Ambulatory Visit
Admission: RE | Admit: 2024-01-29 | Discharge: 2024-01-29 | Disposition: A | Discharge status: Home / Self Care | Source: Ambulatory Visit | Attending: Radiation Oncology | Admitting: Radiation Oncology

## 2024-01-29 ENCOUNTER — Other Ambulatory Visit: Payer: Self-pay

## 2024-01-29 DIAGNOSIS — Z510 Encounter for antineoplastic radiation therapy: Secondary | ICD-10-CM | POA: Diagnosis not present

## 2024-01-29 DIAGNOSIS — N5231 Erectile dysfunction following radical prostatectomy: Secondary | ICD-10-CM | POA: Diagnosis not present

## 2024-01-29 DIAGNOSIS — Z191 Hormone sensitive malignancy status: Secondary | ICD-10-CM | POA: Diagnosis not present

## 2024-01-29 DIAGNOSIS — N393 Stress incontinence (female) (male): Secondary | ICD-10-CM | POA: Diagnosis not present

## 2024-01-29 DIAGNOSIS — C61 Malignant neoplasm of prostate: Secondary | ICD-10-CM | POA: Diagnosis not present

## 2024-01-29 LAB — RAD ONC ARIA SESSION SUMMARY
Course Elapsed Days: 55
Plan Fractions Treated to Date: 13
Plan Prescribed Dose Per Fraction: 1.8 Gy
Plan Total Fractions Prescribed: 13
Plan Total Prescribed Dose: 23.4 Gy
Reference Point Dosage Given to Date: 23.4 Gy
Reference Point Session Dosage Given: 1.8 Gy
Session Number: 38

## 2024-01-31 NOTE — Progress Notes (Signed)
 Patient was a RadOnc Consult on 11/12/23 for his adverse pathology s/p RALP 07/2023 for Stage pT3bN0, Gleason 4+3 prostate cancer.  Patient proceed with treatment recommendations of 7.5 weeks of IMRT and had his final radiation treatment on 01/29/24.   Patient is scheduled for a post treatment nurse call on 03/03/24 and has active urology follow up's.

## 2024-02-03 DIAGNOSIS — E559 Vitamin D deficiency, unspecified: Secondary | ICD-10-CM | POA: Diagnosis not present

## 2024-02-03 NOTE — Radiation Completion Notes (Addendum)
" °  Radiation Oncology         [phone] ________________________________  Name: Roberto Knapp MRN: [id number]  Date: 01/29/2024  DOB: 1970-12-02  Referring Physician: DONNICE SIAD, M.D. Date of Service: 2024-02-03 Radiation Oncologist: Adina Barge, M.D. [facility] -      RADIATION ONCOLOGY END OF TREATMENT NOTE     Diagnosis: 53 y.o. gentleman with adverse pathology s/p RALP 07/2023 for Stage pT3bN0, Gleason 4+3 prostate cancer   Intent: Curative     ==========DELIVERED PLANS==========  First Treatment Date: 2023-12-05 Last Treatment Date: 2024-01-29   Plan Name: ProstBed_Pelv Site: Prostate Bed Technique: IMRT Mode: Photon Dose Per Fraction: 1.8 Gy Prescribed Dose (Delivered / Prescribed): 45 Gy / 45 Gy Prescribed Fxs (Delivered / Prescribed): 25 / 25   Plan Name: ProstBed_Bst Site: Prostate Bed Technique: IMRT Mode: Photon Dose Per Fraction: 1.8 Gy Prescribed Dose (Delivered / Prescribed): 23.4 Gy / 23.4 Gy Prescribed Fxs (Delivered / Prescribed): 13 / 13     ==========ON TREATMENT VISIT DATES========== 2023-12-06, 2023-12-13, 2023-12-20, 2023-12-27, 2024-01-03, 2024-01-10, 2024-01-21, 2024-01-29     See weekly On Treatment Notes in Epic for details in the Media tab (listed as Progress notes on the On Treatment Visit Dates listed above).  He tolerated the daily radiation treatments relatively well with mild increased LUTS and modest fatigue.  The patient will receive a call in about one month from the radiation oncology department. He will continue follow up with Dr. Siad as well.  ------------------------------------------------   Donnice Barge, MD [facility]  Radiation Oncology Direct Dial: [phone]  Fax: [phone] [email or web address]  Skype  LinkedIn    "

## 2024-02-05 NOTE — Progress Notes (Unsigned)
 Reason for visit: Headache, visual disturbance   No chief complaint on file.    Roberto Knapp is a 53 y.o. male    HPI:   Update 02/05/2024 JM: Patient returns for yearly follow-up visit.  Migraines remain well-controlled  Remains on topiramate  75 mg nightly without side effects.  Uses sumatriptan  with benefit.  Since prior visit, he was diagnosed with prostate cancer and completed radiation treatment last month.     History provided for reference purposes only Update 02/05/2023 JM: Returns for 1 year follow-up unaccompanied.  Doing well since prior visit.  Reports 2 migraine days per month, can have associated dizziness which is typical. No reoccurrence of vision disturbance or weakness.  Remains on topiramate  75 mg nightly and sumatriptan  with benefit. Will resolve shortly after taking sumatriptan , will need to stay still and sit back and relax. No questions or concerns at this time.   Update 02/05/2022 JM: Patient returns for 58-month migraine follow-up unaccompanied.  Remains on topiramate  75 mg nightly.  Tolerating well.  Currently experiencing about 2 migraine days per month.  Continued use of sumatriptan  with benefit without side effects.  No questions or concerns today.  Update 08/01/2021 JM: Patient returns for 24-month migraine follow-up.  Doing well since prior visit.  Remains on topiramate  75 mg nightly.  Currently experiencing about 3-4 migraines per month, can last 10-15 minutes.  Use of sumatriptan  with benefit. If he is unable to take at onset, will have prolonged headache. He will have difficulty at times staying asleep or not feeling rested in the morning, he has tried a friends Valium with improvement of the symptoms.  He was recently diagnosed with sleep apnea and has been using his CPAP machine nightly over the past 3-4 months, this is managed by his PCP.  No further concerns at this time.  Update 01/17/2021 JM: Returns for follow-up visit after prior visit with Dr.  Jenel 5 months ago.  Started Topamax  with current dose 75 mg nightly for likely migraine headaches at prior visit.  Reports improvement of migraine headaches currently experiencing 2 to 28-month with decreased intensity.  Typically last 30 to 40 minutes but will need to find a quite place to relax until migraine subsides.  Occasionally accompanied by mild dizziness but no reoccurring visual changes, weakness or speech changes associated with migraine headaches.  No further concerns at this time.  Consult visit 08/10/2020 Dr. Jenel: Roberto Knapp is a 53 year old right-handed white male with a history of tobacco abuse and ADD on Vyvanse .  The patient went to the emergency room on 25 Jul 2020 with onset of dizziness.  He claims that he woke up that morning, and sat up and began to feel somewhat lightheaded, with some degree of a spinning sensation or vertigo.  He initially did not have a headache.  He felt somewhat nauseated and felt fainty.  He also noted black spots in front of the eye on the right.  He decided he was going to try to make it to work and by the time he got to work he was feeling worse.  The dizziness felt more significant, the spots in front of the right eye were more prominent, and he began to have a headache.  The patient also noted that he was feeling somewhat confused and was slurring his speech and he had some clumsiness or weakness of his right arm.  He called his wife to come get him, and he wound up going to the emergency  room.  The patient underwent MRI of the brain which showed chronic small vessel changes that were relatively mild, but no acute changes were seen.  MRA of the head and neck were unremarkable.  The patient had symptoms for about 30 to 45 minutes with full clearing at that point.  The patient in retrospect indicates that over the last year or so he has had a total of 4 such episodes, all events are stereotypical, associated with spots in front of the eye, headache, dizziness, and  right arm weakness or clumsiness.  The patient reports that he has had headaches off and on throughout his entire life.  The headaches generally occur about once a week, he will take Advil  with good improvement.  He indicates that his mother and his maternal grandmother also had some headache.  The patient was told to go on low-dose aspirin  in the emergency room which he is now doing.  He comes to this office for further evaluation.      Past Medical History:  Diagnosis Date   ADHD (attention deficit hyperactivity disorder)    Cancer (HCC)    Chronic kidney disease    Drug overdose, intentional [facility])    ED (erectile dysfunction)    Hemiplegic migraine 08/10/2020   Sleep apnea     Past Surgical History:  Procedure Laterality Date   FASCIOTOMY  09/08/2011   Procedure: FASCIOTOMY;  Surgeon: Carlin FORBES Haddock, MD;  Location[facility] OR;  Service: Vascular;  Laterality: Left;   FEMORAL-POPLITEAL BYPASS GRAFT  09/08/2011   Procedure: BYPASS GRAFT FEMORAL-POPLITEAL ARTERY;  Surgeon: Carlin FORBES Haddock, MD;  Location: [facility] OR;  Service: Vascular;  Laterality: Left;   NO PAST SURGERIES     PROSTATE BIOPSY     ROBOT ASSISTED LAPAROSCOPIC RADICAL PROSTATECTOMY N/A 07/15/2023   Procedure: PROSTATECTOMY, RADICAL, ROBOT-ASSISTED, LAPAROSCOPIC;  Surgeon: Selma Donnice SAUNDERS, MD;  Location: [facility] ORS;  Service: Urology;  Laterality: N/A;    Family History  Problem Relation Age of Onset   Hypertension Father     Social history:  reports that he has been smoking cigarettes. He has a 12.5 pack-year smoking history. He has never used smokeless tobacco. He reports current alcohol use of about 3.0 - 4.0 standard drinks of alcohol per week. He reports current drug use. Drug: Marijuana.  Medications:  Prior to Admission medications   Medication Sig Start Date End Date Taking? Authorizing Provider  Acetaminophen  (TYLENOL  PO) Take 2 tablets by mouth every 6 (six) hours as needed (pain/fever/headache).   Yes [provider]  aspirin  EC 81 MG tablet Take 1 tablet (81 mg total) by mouth daily. Swallow whole. 07/25/20  Yes Nanavati, Ankit, MD  calcium  carbonate (TUMS - DOSED IN MG ELEMENTAL CALCIUM ) 500 MG chewable tablet Chew 1 tablet by mouth daily.   Yes [provider]  dimenhyDRINATE (DRAMAMINE) 50 MG tablet Take 50 mg by mouth every 8 (eight) hours as needed.   Yes [provider]  ibuprofen  (ADVIL ) 600 MG tablet Take 1 tablet (600 mg total) by mouth every 6 (six) hours as needed. 08/10/19  Yes Dean Clarity, MD  loratadine  (CLARITIN ) 10 MG tablet Take 10 mg by mouth daily as needed for allergies.   Yes [provider]  meclizine  (ANTIVERT ) 50 MG tablet Take 1 tablet (50 mg total) by mouth 3 (three) times daily as needed. 07/25/20  Yes Charlyn Sora, MD  VYVANSE  70 MG capsule Take 70 mg by mouth every morning. 07/06/20  Yes [provider]  amoxicillin -clavulanate (AUGMENTIN ) 875-125 MG tablet Take 1 tablet by mouth every 12 (twelve) hours. 08/10/19   Dean Clarity, MD  HYDROcodone -acetaminophen  (NORCO/VICODIN) 5-325 MG tablet Take 1 tablet by mouth every 4 (four) hours as needed. 08/10/19   Dean Clarity, MD  pantoprazole  (PROTONIX ) 40 MG tablet Take 1 tablet (40 mg total) by mouth daily. 03/07/18   Tobie Yetta HERO, MD     No Known Allergies  ROS:  Out of a complete 14 system review of symptoms, the patient complains only of the following symptoms as noted in HPI, and all other reviewed systems are negative.     There were no vitals filed for this visit.  There is no height or weight on file to calculate BMI.   Physical Exam  General: well developed, well nourished, very pleasant middle-age Caucasian male, seated, in no evident distress  Neurologic Exam Mental Status: Awake and fully alert. Oriented to place and time. Recent and remote memory intact. Attention span, concentration and fund of knowledge appropriate. Mood and affect appropriate.  Cranial  Nerves: Pupils equal, briskly reactive to light. Extraocular movements full without nystagmus. Visual fields full to confrontation. Hearing intact. Facial sensation intact. Face, tongue, palate moves normally and symmetrically.  Motor: Normal bulk and tone. Normal strength in all tested extremity muscles Sensory.: intact to touch , pinprick , position and vibratory sensation.  Coordination: Rapid alternating movements normal in all extremities. Finger-to-nose and heel-to-shin performed accurately bilaterally. Gait and Station: Arises from chair without difficulty. Stance is normal. Gait demonstrates normal stride length and balance without use of assistive device. Tandem walk and heel toe without difficulty.  Reflexes: 1+ and symmetric. Toes downgoing.       Pertinent imaging:  MRI brain 07/25/20: IMPRESSION: No acute infarction, hemorrhage, or mass. Probable mild chronic microvascular ischemic changes.   No large vessel occlusion, hemodynamically significant stenosis, or evidence of dissection.   MRA head and neck 07/25/20: IMPRESSION: No acute infarction, hemorrhage, or mass. Probable mild chronic microvascular ischemic changes.   No large vessel occlusion, hemodynamically significant stenosis, or evidence of dissection.  MRA HEAD   Intracranial internal carotid arteries are patent. Middle and anterior cerebral arteries are patent. Intracranial vertebral arteries, basilar artery, posterior cerebral arteries are patent. Bilateral posterior communicating arteries are present. There is no significant stenosis or aneurysm.     Assessment/Plan:  1.  Atypical migraine (episode of dizziness, visual disturbance, headache, and right arm weakness)   -Currently 2 migraines per month -Continue topiramate  75 mg nightly -refill provided.  -Continue sumatriptan  100 mg daily as needed for abortive therapy - advised he can repeat x1 after 2 hours if needed. Max dose 200 mg per 24 hours.      Follow-up in 1 year or call earlier if needed    CC:  Alben Jenkins Salt, MD    Harlene Bogaert, [facility]  [facility] [address] [place], [place] [address]  Phone [phone] Fax [phone] Note: This document was prepared with digital dictation and possible smart phrase technology. Any transcriptional errors that result from this process are unintentional.

## 2024-02-06 ENCOUNTER — Encounter: Payer: Self-pay | Admitting: Adult Health

## 2024-02-06 ENCOUNTER — Ambulatory Visit: Payer: No Typology Code available for payment source | Admitting: Adult Health

## 2024-02-06 VITALS — BP 107/72 | HR 74 | Ht 68.0 in | Wt 157.6 lb

## 2024-02-06 DIAGNOSIS — G43009 Migraine without aura, not intractable, without status migrainosus: Secondary | ICD-10-CM

## 2024-02-06 MED ORDER — TOPIRAMATE 25 MG PO TABS: 75.0000 mg | ORAL_TABLET | Freq: Every day | ORAL | 3 refills | Status: AC

## 2024-02-06 MED ORDER — SUMATRIPTAN SUCCINATE 100 MG PO TABS: 100.0000 mg | ORAL_TABLET | Freq: Once | ORAL | 11 refills | Status: AC | PRN

## 2024-02-06 NOTE — Patient Instructions (Signed)
 Your Plan:  Continue Topamax  75mg  daily for headache prevention  Please let me know if you feel this dosage needs to be adjusted   Look at GoodRx for different pharmacies that may be cheaper for topamax  such as [facility] - looks like you can get a month supply of topamax  for $13 at [facility] - if you need a new prescription sent there, please let me know  Continue sumatriptan  as needed    You can discuss having your PCP continue to provide refills for both of these medications so you are not having to pay a specialty copay just for refills. If she is able to assist with this, please let me know and you can cancel the yearly follow up. You can always follow back up if your headaches should worsen or we need to make any adjustments.     Happy holidays!      Thank you for coming to see us  at [facility]. I hope we have been able to provide you high quality care today.  You may receive a patient satisfaction survey over the next few weeks. We would appreciate your feedback and comments so that we may continue to improve ourselves and the health of our patients.

## 2024-02-19 NOTE — Progress Notes (Signed)
" °  Radiation Oncology         [phone] ________________________________  Name: DMARIO RUSSOM MRN: [id number]  Date of Service: 03/03/2024  DOB: 04-10-1970  Post Treatment Telephone Note  Diagnosis:  53 y.o. gentleman with adverse pathology s/p RALP 07/2023 for Stage [id number], Gleason 4+3 prostate cancer    First Treatment Date: 2023-12-05 Last Treatment Date: 2024-01-29   Plan Name: ProstBed_Pelv Site: Prostate Bed Technique: IMRT Mode: Photon Dose Per Fraction: 1.8 Gy Prescribed Dose (Delivered / Prescribed): 45 Gy / 45 Gy Prescribed Fxs (Delivered / Prescribed): 25 / 25   Plan Name: ProstBed_Bst Site: Prostate Bed Technique: IMRT Mode: Photon Dose Per Fraction: 1.8 Gy Prescribed Dose (Delivered / Prescribed): 23.4 Gy / 23.4 Gy Prescribed Fxs (Delivered / Prescribed): 13 / 13   Pre Treatment IPSS Score: 23 (as documented in the provider consult note)  The patient was not available for call today. Left a voicemail to return the call.  "

## 2024-03-03 ENCOUNTER — Ambulatory Visit
Admission: RE | Admit: 2024-03-03 | Discharge: 2024-03-03 | Disposition: A | Discharge status: Home / Self Care | Source: Ambulatory Visit | Attending: Radiation Oncology | Admitting: Radiation Oncology

## 2024-03-03 DIAGNOSIS — C61 Malignant neoplasm of prostate: Secondary | ICD-10-CM

## 2024-04-10 ENCOUNTER — Other Ambulatory Visit: Payer: Self-pay | Admitting: Urology

## 2024-04-10 DIAGNOSIS — C61 Malignant neoplasm of prostate: Secondary | ICD-10-CM

## 2025-02-08 ENCOUNTER — Ambulatory Visit: Admitting: Adult Health
